# Patient Record
Sex: Female | Born: 1950
Health system: Southern US, Community
[De-identification: ages and names within clinical notes are randomized; demographics above are authoritative.]

## PROBLEM LIST (undated history)

## (undated) DIAGNOSIS — M1990 Unspecified osteoarthritis, unspecified site: Secondary | ICD-10-CM

## (undated) DIAGNOSIS — I10 Essential (primary) hypertension: Secondary | ICD-10-CM

## (undated) DIAGNOSIS — D329 Benign neoplasm of meninges, unspecified: Secondary | ICD-10-CM

## (undated) DIAGNOSIS — F419 Anxiety disorder, unspecified: Secondary | ICD-10-CM

## (undated) DIAGNOSIS — K219 Gastro-esophageal reflux disease without esophagitis: Secondary | ICD-10-CM

## (undated) DIAGNOSIS — Z9109 Other allergy status, other than to drugs and biological substances: Secondary | ICD-10-CM

## (undated) DIAGNOSIS — E785 Hyperlipidemia, unspecified: Secondary | ICD-10-CM

## (undated) DIAGNOSIS — Z9889 Other specified postprocedural states: Secondary | ICD-10-CM

## (undated) DIAGNOSIS — R112 Nausea with vomiting, unspecified: Secondary | ICD-10-CM

## (undated) DIAGNOSIS — G709 Myoneural disorder, unspecified: Secondary | ICD-10-CM

## (undated) DIAGNOSIS — G500 Trigeminal neuralgia: Secondary | ICD-10-CM

## (undated) DIAGNOSIS — C801 Malignant (primary) neoplasm, unspecified: Secondary | ICD-10-CM

## (undated) HISTORY — DX: Malignant (primary) neoplasm, unspecified: C80.1

## (undated) HISTORY — PX: WISDOM TOOTH EXTRACTION: SHX21

## (undated) HISTORY — PX: SHOULDER SURGERY: SHX246

## (undated) HISTORY — PX: NASAL SINUS SURGERY: SHX719

## (undated) HISTORY — DX: Anxiety disorder, unspecified: F41.9

## (undated) HISTORY — DX: Benign neoplasm of meninges, unspecified: D32.9

## (undated) HISTORY — DX: Myoneural disorder, unspecified: G70.9

## (undated) HISTORY — PX: KNEE SURGERY: SHX244

---

## 1998-11-30 ENCOUNTER — Ambulatory Visit (HOSPITAL_COMMUNITY): Admission: RE | Admit: 1998-11-30 | Discharge: 1998-11-30 | Payer: Self-pay

## 1999-01-28 ENCOUNTER — Other Ambulatory Visit: Admission: RE | Admit: 1999-01-28 | Discharge: 1999-01-28 | Payer: Self-pay | Admitting: *Deleted

## 1999-01-31 ENCOUNTER — Encounter: Payer: Self-pay | Admitting: Family Medicine

## 1999-01-31 ENCOUNTER — Encounter: Admission: RE | Admit: 1999-01-31 | Discharge: 1999-01-31 | Payer: Self-pay | Admitting: Family Medicine

## 2002-12-02 ENCOUNTER — Encounter: Payer: Self-pay | Admitting: Family Medicine

## 2002-12-02 ENCOUNTER — Encounter: Admission: RE | Admit: 2002-12-02 | Discharge: 2002-12-02 | Payer: Self-pay | Admitting: Family Medicine

## 2002-12-04 ENCOUNTER — Encounter: Admission: RE | Admit: 2002-12-04 | Discharge: 2002-12-04 | Payer: Self-pay | Admitting: Family Medicine

## 2002-12-04 ENCOUNTER — Encounter: Payer: Self-pay | Admitting: Family Medicine

## 2003-02-23 ENCOUNTER — Other Ambulatory Visit: Admission: RE | Admit: 2003-02-23 | Discharge: 2003-02-23 | Payer: Self-pay | Admitting: Family Medicine

## 2004-04-20 ENCOUNTER — Encounter: Admission: RE | Admit: 2004-04-20 | Discharge: 2004-04-20 | Payer: Self-pay | Admitting: Family Medicine

## 2004-07-04 ENCOUNTER — Other Ambulatory Visit: Admission: RE | Admit: 2004-07-04 | Discharge: 2004-07-04 | Payer: Self-pay | Admitting: Family Medicine

## 2006-01-26 ENCOUNTER — Encounter: Admission: RE | Admit: 2006-01-26 | Discharge: 2006-01-26 | Payer: Self-pay | Admitting: Family Medicine

## 2006-04-27 ENCOUNTER — Encounter: Payer: Self-pay | Admitting: Pulmonary Disease

## 2006-04-27 ENCOUNTER — Encounter: Admission: RE | Admit: 2006-04-27 | Discharge: 2006-04-27 | Payer: Self-pay | Admitting: Family Medicine

## 2006-06-08 ENCOUNTER — Encounter: Payer: Self-pay | Admitting: Pulmonary Disease

## 2006-06-11 ENCOUNTER — Encounter: Payer: Self-pay | Admitting: Pulmonary Disease

## 2006-10-03 ENCOUNTER — Encounter: Admission: RE | Admit: 2006-10-03 | Discharge: 2006-10-03 | Payer: Self-pay | Admitting: Allergy

## 2006-10-03 ENCOUNTER — Encounter: Payer: Self-pay | Admitting: Pulmonary Disease

## 2007-04-01 ENCOUNTER — Encounter: Payer: Self-pay | Admitting: Pulmonary Disease

## 2008-03-23 ENCOUNTER — Encounter: Payer: Self-pay | Admitting: Pulmonary Disease

## 2009-10-08 ENCOUNTER — Encounter: Payer: Self-pay | Admitting: Pulmonary Disease

## 2009-12-09 ENCOUNTER — Encounter: Payer: Self-pay | Admitting: Pulmonary Disease

## 2009-12-09 DIAGNOSIS — N200 Calculus of kidney: Secondary | ICD-10-CM | POA: Insufficient documentation

## 2009-12-09 DIAGNOSIS — N841 Polyp of cervix uteri: Secondary | ICD-10-CM | POA: Insufficient documentation

## 2009-12-09 DIAGNOSIS — J309 Allergic rhinitis, unspecified: Secondary | ICD-10-CM | POA: Insufficient documentation

## 2009-12-09 DIAGNOSIS — J45991 Cough variant asthma: Secondary | ICD-10-CM | POA: Insufficient documentation

## 2009-12-09 DIAGNOSIS — E559 Vitamin D deficiency, unspecified: Secondary | ICD-10-CM | POA: Insufficient documentation

## 2009-12-09 DIAGNOSIS — E7800 Pure hypercholesterolemia, unspecified: Secondary | ICD-10-CM | POA: Insufficient documentation

## 2009-12-09 DIAGNOSIS — M1990 Unspecified osteoarthritis, unspecified site: Secondary | ICD-10-CM | POA: Insufficient documentation

## 2009-12-09 DIAGNOSIS — I10 Essential (primary) hypertension: Secondary | ICD-10-CM | POA: Insufficient documentation

## 2009-12-10 ENCOUNTER — Ambulatory Visit: Payer: Self-pay | Admitting: Pulmonary Disease

## 2010-04-12 NOTE — Letter (Signed)
Summary: [facility]  [facility]   Imported By: Sherian Rein 01/03/2010 09:02:55  _____________________________________________________________________  External Attachment:    Type:   Image     Comment:   External Document

## 2010-04-12 NOTE — Miscellaneous (Signed)
Summary: [facility]   Imported By: Sherian Rein 01/03/2010 09:01:33  _____________________________________________________________________  External Attachment:    Type:   Image     Comment:   External Document

## 2010-04-12 NOTE — Assessment & Plan Note (Signed)
Summary: cough/apc   Visit Type:  Initial Consult Primary Provider/Referring Provider:  Dr. Raquel James (PCP) Dr. [place] Callas (Allergy)  CC:  Pt here for pulmonary consult.  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 c/o allergies for years - dr [place] Callas - gave xopenex mDI , did not help h/o sinus surgery > 27yrs ago , no infection x 3 ys , on omnairis & astepro CXR nml No childhood h/oa sthma, wheezing or nocturnal symptoms. No environmental allergens, dog mostly outside the house   Preventive Screening-Counseling & Management  Alcohol-Tobacco     Smoking Status: never  Current Medications (verified): 1)  Xyzal 5 Mg Tabs (Levocetirizine Dihydrochloride) .... As Needed 2)  Omnaris 50 Mcg/act Susp (Ciclesonide) .Marland Kitchen.. 1 Spray Each Nostril Once Daily 3)  Astepro 0.15 % Soln (Azelastine Hcl) .Marland Kitchen.. 1 Spray Each Nostril Once Daily 4)  Icaps  Caps (Multiple Vitamins-Minerals) .... Take 2 Tablet By Mouth Two Times A Day 5)  Aspirin 81 Mg  Tabs (Aspirin) .... Take 1 Tablet By Mouth Once A Day 6)  Milk Thistle 140 Mg Caps (Milk Thistle) .... Take 2 Tablet By Mouth Once A Day 7)  Red Yeast Rice 600 Mg Caps (Red Yeast Rice Extract) .... Take 2 Tablet By Mouth Once A Day 8)  Vitamin D3 1000 Unit/spray Liqd (Cholecalciferol) .... Once Daily 9)  Losartan Potassium 25 Mg Tabs (Losartan Potassium) .... Take 1 Tablet By Mouth Once A Day 10)  Xopenex Hfa 45 Mcg/act Aero (Levalbuterol Tartrate) .... As Directed 11)  Omeprazole 40 Mg Cpdr (Omeprazole) .... Take 1 Tablet By Mouth Once A Day 12)  Sanvisc Injection .... Knee 13)  Mucinex 600 Mg Xr12h-Tab (Guaifenesin) .... As Directed As Needed 14)  Calcium-Magnesium 500-250 Mg Tabs  (Calcium-Magnesium) .... Take 1 Tablet By Mouth Once A Day  Allergies (verified): 1)  ! Pcn 2)  ! Sulfa 3)  ! Ace Inhibitors 4)  ! Darvon 5)  ! Fish Oil (Fish Oil) 6)  ! * Nickel 7)  ! * Adhesive Tape  Past History:  Past Medical History: Last updated: 12/09/2009 ALLERGIC RHINITIS (ICD-477.9) NEPHROLITHIASIS (ICD-592.0) VITAMIN D DEFICIENCY (ICD-268.9) OSTEOARTHRITIS (ICD-715.90) HYPERTENSION (ICD-401.9) CERVICAL POLYP (ICD-622.7) COUGH (ICD-786.2) HYPERCHOLESTEROLEMIA (ICD-272.0)    Past Surgical History: Last updated: 12/09/2009 Knee surgery x3 Shoulder surgery: left and right Sinus surgery  Family History: Family History Hypertension-mother, sibling Family History Lung Cancer-father Family History MI/Heart Attack-mother CAD-mother Parents smokers  Social History: Marital Status: Married, lives with husband Wende Longstreth Children: 2 daughters 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  Smoking Status:  never  Review of Systems       The patient complains of non-productive cough and acid heartburn.  The patient denies shortness of breath with activity, shortness of breath at rest, productive cough, coughing up blood, chest pain, irregular heartbeats, indigestion, loss of appetite, weight change, abdominal pain, difficulty swallowing, sore throat, tooth/dental problems, headaches, nasal congestion/difficulty breathing through nose, sneezing, itching, ear ache, anxiety, depression, hand/feet swelling, joint stiffness or pain, rash, change in color of mucus, and fever.    Vital Signs:  Patient profile:   60 year old female Height:      64.5 inches Weight:  165 pounds BMI:     27.99 O2 Sat:      97 % on Room air Temp:     98.0 degrees F oral Pulse rate:   80 / minute BP sitting:   118 / 84  (left arm) Cuff size:   regular  Vitals Entered By: Zackery Barefoot CMA (December 10, 2009 11:26 AM)  O2 Flow:  Room air CC: Pt here for pulmonary  consult Comments Medications reviewed with patient Verified contact number and pharmacy with patient Zackery Barefoot CMA  December 10, 2009 11:27 AM    Physical Exam  Additional Exam:  Gen. Pleasant, well-nourished, in no distress, normal affect ENT - no lesions, no post nasal drip Neck: No JVD, no thyromegaly, no carotid bruits Lungs: no use of accessory muscles, no dullness to percussion, clear without rales or rhonchi  Cardiovascular: Rhythm regular, heart sounds  normal, no murmurs or gallops, no peripheral edema Abdomen: soft and non-tender, no hepatosplenomegaly, BS normal. Musculoskeletal: No deformities, no cyanosis or clubbing Neuro:  alert, non focal     Impression & Recommendations:  Problem # 1:  COUGH (ICD-786.2) Off enalapril x 6 months - cough should have gone by now Given nml CXR - likley etiologies include upper airway cough vs GERD. Will pursue empiric therapy for both rather than diagnostic testing. Pseudophed x 3 weeks - discussed side fects on Bp & insomnia, ct xyzal PPI for acid suppression x 2 months  - omeprazole ok. If persistent on return visit, consider sinus imaging or non acid reflux Orders: Consultation Level III (16109) T-2 View CXR (71020TC)  Medications Added to Medication List This Visit: 1)  Sanvisc Injection  .... Knee 2)  Mucinex 600 Mg Xr12h-tab (Guaifenesin) .... As directed as needed 3)  Calcium-magnesium 500-250 Mg Tabs (Calcium-magnesium) .... Take 1 tablet by mouth once a day 4)  12 Hour Decongestant 120 Mg Xr12h-cap (Pseudoephedrine hcl) .... Start once daily x 3 days, then two times a day  Patient Instructions: 1)  Copy sent to:dr turnbull 2)  Please schedule a follow-up appointment in 6 weeks. 3)  A chest x-ray has been recommended.  Your imaging study may require preauthorization.  4)  Decongestant x 3 weeks - plese check your BP with this twice/ week 5)  Omeprazole x 2 months Prescriptions: 12 HOUR DECONGESTANT 120 MG  XR12H-CAP (PSEUDOEPHEDRINE HCL) start once daily x 3 days, then two times a day  #42 x 0   Entered and Authorized by:   Comer Locket Vassie Loll MD   Signed by:   Comer Locket Vassie Loll MD on 12/10/2009   Method used:   Electronically to        [facility] [phone]* (retail)       [address]       [place], [place]  [address]       Ph: [phone] or [phone]       Fax: [phone]   RxID:   [id number]      Appended Document: cough/apc reviewed allergy notes >> spirometry nml on numerous occasions, Rt maxillary sinus AF level in 2/08 resolved on FU CT 7/08, CXR nml

## 2012-03-05 ENCOUNTER — Other Ambulatory Visit: Payer: Self-pay | Admitting: Family Medicine

## 2012-03-05 DIAGNOSIS — M5412 Radiculopathy, cervical region: Secondary | ICD-10-CM

## 2012-03-08 NOTE — Progress Notes (Signed)
Blood drawn for labs from right [facility]. Site unremarkable and pt tolerated procedure well.

## 2012-03-10 ENCOUNTER — Ambulatory Visit
Admission: RE | Admit: 2012-03-10 | Discharge: 2012-03-10 | Disposition: A | Discharge status: Home / Self Care | Payer: BC Managed Care – PPO | Source: Ambulatory Visit | Attending: Family Medicine | Admitting: Family Medicine

## 2012-03-10 DIAGNOSIS — M5412 Radiculopathy, cervical region: Secondary | ICD-10-CM

## 2012-03-10 MED ORDER — GADOBENATE DIMEGLUMINE 529 MG/ML IV SOLN
15.0000 mL | Freq: Once | INTRAVENOUS | Status: AC | PRN
  Administered 2012-03-10: 15 mL via INTRAVENOUS

## 2012-03-13 HISTORY — PX: OTHER SURGICAL HISTORY: SHX169

## 2012-05-11 HISTORY — PX: OTHER SURGICAL HISTORY: SHX169

## 2012-05-21 ENCOUNTER — Other Ambulatory Visit: Payer: Self-pay | Admitting: Neurosurgery

## 2012-05-21 ENCOUNTER — Encounter (HOSPITAL_COMMUNITY): Payer: Self-pay

## 2012-05-27 ENCOUNTER — Encounter (HOSPITAL_COMMUNITY)
Admission: RE | Admit: 2012-05-27 | Discharge: 2012-05-27 | Disposition: A | Discharge status: Home / Self Care | Payer: BC Managed Care – PPO | Source: Ambulatory Visit | Attending: Neurosurgery | Admitting: Neurosurgery

## 2012-06-04 ENCOUNTER — Inpatient Hospital Stay (HOSPITAL_COMMUNITY): Admission: RE | Admit: 2012-06-04 | Payer: BC Managed Care – PPO | Source: Ambulatory Visit | Admitting: Neurosurgery

## 2012-06-04 ENCOUNTER — Encounter (HOSPITAL_COMMUNITY): Admission: RE | Payer: Self-pay | Source: Ambulatory Visit

## 2012-06-04 SURGERY — ANTERIOR CERVICAL DECOMPRESSION/DISCECTOMY FUSION 3 LEVELS
Anesthesia: General

## 2012-06-28 ENCOUNTER — Ambulatory Visit: Payer: Self-pay | Admitting: Nurse Practitioner

## 2012-12-20 ENCOUNTER — Encounter: Payer: Self-pay | Admitting: Internal Medicine

## 2013-01-02 ENCOUNTER — Other Ambulatory Visit: Payer: Self-pay | Admitting: Orthopedic Surgery

## 2013-01-02 DIAGNOSIS — S7290XA Unspecified fracture of unspecified femur, initial encounter for closed fracture: Secondary | ICD-10-CM

## 2013-01-07 ENCOUNTER — Ambulatory Visit
Admission: RE | Admit: 2013-01-07 | Discharge: 2013-01-07 | Disposition: A | Discharge status: Home / Self Care | Payer: BC Managed Care – PPO | Source: Ambulatory Visit | Attending: Orthopedic Surgery | Admitting: Orthopedic Surgery

## 2013-01-07 DIAGNOSIS — S7290XA Unspecified fracture of unspecified femur, initial encounter for closed fracture: Secondary | ICD-10-CM

## 2013-04-07 NOTE — Progress Notes (Signed)
Dr. Wynelle Link could  you please put orders in [facility] as patient has pre-op appointment 04/09/2013 at 1 pm! Thank you!

## 2013-04-08 ENCOUNTER — Encounter (HOSPITAL_COMMUNITY): Payer: Self-pay | Admitting: Pharmacy Technician

## 2013-04-08 ENCOUNTER — Other Ambulatory Visit: Payer: Self-pay | Admitting: Orthopedic Surgery

## 2013-04-08 NOTE — Patient Instructions (Addendum)
Barbara [name]  04/08/2013                           YOUR PROCEDURE IS SCHEDULED ON:  04/16/13               PLEASE REPORT TO SHORT STAY CENTER AT :  2:30 PM               CALL THIS NUMBER IF ANY PROBLEMS THE DAY OF SURGERY :               [phone]                      REMEMBER:   Do not eat food or drink liquids AFTER MIDNIGHT  May have clear liquids UNTIL 6 HOURS BEFORE SURGERY (11:30 AM)  Clear liquids include soda, tea, black coffee, apple or grape juice, broth.  Take these medicines the morning of surgery with A SIP OF WATER: PROPANOLOL   Do not wear jewelry, make-up   Do not wear lotions, powders, or perfumes.   Do not shave legs or underarms 12 hrs. before surgery (men may shave face)  Do not bring valuables to the hospital.  Contacts, dentures or bridgework may not be worn into surgery.  Leave suitcase in the car. After surgery it may be brought to your room.  For patients admitted to the hospital more than one night, checkout time is 11:00                          The day of discharge.   Patients discharged the day of surgery will not be allowed to drive home                             If going home same day of surgery, must have someone stay with you first                           24 hrs at home and arrange for some one to drive you home from hospital.    Special Instructions:   Please read over the following fact sheets that you were given:                                 1. [place]                                                X_____________________________________________________________________        Failure to follow these instructions may result in cancellation of your surgery                                             [place] - Preparing for Surgery  Before surgery, you can play an important role.  Because skin is not sterile, your skin needs to be as free of germs as possible.  You can reduce  the number of germs on you skin by washing with CHG (chlorahexidine  gluconate) soap before surgery.  CHG is an antiseptic cleaner which kills germs and bonds with the skin to continue killing germs even after washing.  Please DO NOT use if you have an allergy to CHG or antibacterial soaps.  If your skin becomes reddened/irritated stop using the CHG and inform your nurse when you arrive at Short Stay.  Do not shave (including legs and underarms) for at least 48 hours prior to the first CHG shower.  You may shave your face.  Please follow these instructions carefully:   1.  Shower with CHG Soap the night before surgery and the morning of Surgery.  2.  If you choose to wash your hair, wash your hair first as usual with your       normal shampoo.  3.  After you shampoo, rinse your hair and body thoroughly to remove the                      Shampoo.  4.  Use CHG as you would any other liquid soap.  You can apply chg directly       to the skin and wash gently with scrungie or a clean washcloth.  5.  Apply the CHG Soap to your body ONLY FROM THE NECK DOWN.        Do not use on open wounds or open sores.  Avoid contact with your eyes,       ears, mouth and genitals (private parts).  Wash genitals (private parts)       with your normal soap.  6.  Wash thoroughly, paying special attention to the area where your surgery        will be performed.  7.  Thoroughly rinse your body with warm water from the neck down.  8.  DO NOT shower/wash with your normal soap after using and rinsing off       the CHG Soap.  9.  Pat yourself dry with a clean towel.            10.  Wear clean pajamas.            11.  Place clean sheets on your bed the night of your first shower and do not        sleep with pets.  Day of Surgery  Do not apply any lotions/deoderants the morning of surgery.  Please wear clean clothes to the hospital/surgery center.              Barbara [name]  04/09/2013                           YOUR  PROCEDURE IS SCHEDULED ON:               PLEASE REPORT TO SHORT STAY CENTER AT :               CALL THIS NUMBER IF ANY PROBLEMS THE DAY OF SURGERY :               [phone]                      REMEMBER:   Do not eat food or drink liquids AFTER MIDNIGHT  May have clear liquids UNTIL 6 HOURS BEFORE SURGERY  Clear liquids include soda, tea, black coffee, apple or grape juice, broth.  Take these medicines the morning of surgery with A SIP OF  WATER:   Do not wear jewelry, make-up   Do not wear lotions, powders, or perfumes.   Do not shave legs or underarms 12 hrs. before surgery (men may shave face)  Do not bring valuables to the hospital.  Contacts, dentures or bridgework may not be worn into surgery.  Leave suitcase in the car. After surgery it may be brought to your room.  For patients admitted to the hospital more than one night, checkout time is 11:00                          The day of discharge.   Patients discharged the day of surgery will not be allowed to drive home                             If going home same day of surgery, must have someone stay with you first                           24 hrs at home and arrange for some one to drive you home from hospital.    Special Instructions:   Please read over the following fact sheets that you were given:               1. MRSA  INFORMATION                      2. [place]                                                X_____________________________________________________________________        Failure to follow these instructions may result in cancellation of your surgery                                                         Barbara [name]  04/09/2013                           YOUR PROCEDURE IS SCHEDULED ON:               PLEASE REPORT TO SHORT STAY CENTER AT :               CALL THIS NUMBER IF ANY PROBLEMS THE DAY OF SURGERY :               [phone]                       REMEMBER:   Do not eat food or drink liquids AFTER MIDNIGHT  May have clear liquids UNTIL 6 HOURS BEFORE SURGERY  Clear liquids include soda, tea, black coffee, apple or grape juice, broth.  Take these medicines the morning of surgery with A SIP OF WATER:   Do not wear jewelry, make-up   Do not wear lotions, powders, or perfumes.   Do not shave legs or underarms 12 hrs. before surgery (men may shave face)  Do not bring  valuables to the hospital.  Contacts, dentures or bridgework may not be worn into surgery.  Leave suitcase in the car. After surgery it may be brought to your room.  For patients admitted to the hospital more than one night, checkout time is 11:00                          The day of discharge.   Patients discharged the day of surgery will not be allowed to drive home                             If going home same day of surgery, must have someone stay with you first                           24 hrs at home and arrange for some one to drive you home from hospital.    Special Instructions:   Please read over the following fact sheets that you were given:               1. MRSA  INFORMATION                      2. [place] PREPARING FOR SURGERY SHEET                                                X_____________________________________________________________________        Failure to follow these instructions may result in cancellation of your surgery

## 2013-04-09 ENCOUNTER — Encounter (HOSPITAL_COMMUNITY): Payer: Self-pay

## 2013-04-09 ENCOUNTER — Ambulatory Visit (HOSPITAL_COMMUNITY)
Admission: RE | Admit: 2013-04-09 | Discharge: 2013-04-09 | Disposition: A | Discharge status: Home / Self Care | Payer: BC Managed Care – PPO | Source: Ambulatory Visit | Attending: Orthopedic Surgery | Admitting: Orthopedic Surgery

## 2013-04-09 ENCOUNTER — Encounter (HOSPITAL_COMMUNITY)
Admission: RE | Admit: 2013-04-09 | Discharge: 2013-04-09 | Disposition: A | Discharge status: Home / Self Care | Payer: BC Managed Care – PPO | Source: Ambulatory Visit | Attending: Orthopedic Surgery | Admitting: Orthopedic Surgery

## 2013-04-09 DIAGNOSIS — I10 Essential (primary) hypertension: Secondary | ICD-10-CM | POA: Insufficient documentation

## 2013-04-09 DIAGNOSIS — Z01812 Encounter for preprocedural laboratory examination: Secondary | ICD-10-CM | POA: Insufficient documentation

## 2013-04-09 DIAGNOSIS — Z01810 Encounter for preprocedural cardiovascular examination: Secondary | ICD-10-CM | POA: Insufficient documentation

## 2013-04-09 DIAGNOSIS — Z01818 Encounter for other preprocedural examination: Secondary | ICD-10-CM | POA: Insufficient documentation

## 2013-04-09 HISTORY — DX: Unspecified osteoarthritis, unspecified site: M19.90

## 2013-04-09 HISTORY — DX: Essential (primary) hypertension: I10

## 2013-04-09 HISTORY — DX: Gastro-esophageal reflux disease without esophagitis: K21.9

## 2013-04-09 HISTORY — DX: Hyperlipidemia, unspecified: E78.5

## 2013-04-09 HISTORY — DX: Other specified postprocedural states: R11.2

## 2013-04-09 HISTORY — DX: Other specified postprocedural states: Z98.890

## 2013-04-09 LAB — BASIC METABOLIC PANEL
BUN: 17 mg/dL (ref 6–23)
CO2: 24 mEq/L (ref 19–32)
Calcium: 9.6 mg/dL (ref 8.4–10.5)
Chloride: 99 mEq/L (ref 96–112)
Creatinine, Ser: 0.73 mg/dL (ref 0.50–1.10)
GFR calc Af Amer: >90 mL/min (ref >90)
GFR calc non Af Amer: 89 mL/min — ABNORMAL LOW (ref >90)
Glucose, Bld: 96 mg/dL (ref 70–99)
Potassium: 4.7 mEq/L (ref 3.7–5.3)
Sodium: 138 mEq/L (ref 137–147)

## 2013-04-09 LAB — CBC
HCT: 42.1 % (ref 36.0–46.0)
Hemoglobin: 13.9 g/dL (ref 12.0–15.0)
MCH: 29.5 pg (ref 26.0–34.0)
MCHC: 33 g/dL (ref 30.0–36.0)
MCV: 89.4 fL (ref 78.0–100.0)
Platelets: 278 10*3/uL (ref 150–400)
RBC: 4.71 MIL/uL (ref 3.87–5.11)
RDW: 12.9 % (ref 11.5–15.5)
WBC: 5.8 10*3/uL (ref 4.0–10.5)

## 2013-04-15 DIAGNOSIS — T8484XA Pain due to internal orthopedic prosthetic devices, implants and grafts, initial encounter: Secondary | ICD-10-CM | POA: Diagnosis present

## 2013-04-15 NOTE — H&P (Signed)
CC- Barbara [name] is a 63 y.o. female who presents with left knee pain.  HPI- . Knee Pain: Patient presents with knee pain involving the  left knee. Onset of the symptoms was several months ago. Inciting event: She had a femur fracture approximately a year ago treated with a retrograde IM nail. She has healed this uneventfully but has significant lateral knee pain adjacent to the interlock screws present in the femoral nail. Current symptoms include pain located lateral knee over the screw heads. Pain is aggravated by any weight bearing, going up and down stairs and rising after sitting.  Patient has had prior knee problems. Evaluation to date: plain films: abnormal retrograde IM nail left femur with healed distal femur fracture. Treatment to date: none.  Past Medical History  Diagnosis Date  . PONV (postoperative nausea and vomiting)     "BP DROPS ALSO"  . Hypertension   . Hyperlipidemia   . Occasional tremors   . Asthma   . Knee pain, left   . Arthritis   . History of skin cancer   . GERD (gastroesophageal reflux disease)     Past Surgical History  Procedure Laterality Date  . Fractured femur  05/2012    left - rod -screws placed  . Knee surgery      x 3 left / x1 rt knee  . Shoulder surgery      bilateral shoulders   . Wisdom tooth extraction      Prior to Admission medications   Medication Sig Start Date End Date Taking? Authorizing Provider  acetaminophen (TYLENOL) 500 MG tablet Take 1,000 mg by mouth every 6 (six) hours as needed for mild pain or moderate pain.     Historical Provider, MD  aspirin EC 81 MG tablet Take 81 mg by mouth daily.    Historical Provider, MD  beta carotene w/minerals (OCUVITE) tablet Take 1 tablet by mouth daily.    Historical Provider, MD  Cholecalciferol (VITAMIN D-3) 1000 UNITS CAPS Take 1 capsule by mouth daily.    Historical Provider, MD  Ferrous Sulfate (IRON) 325 (65 FE) MG TABS Take 1 tablet by mouth 2 (two) times a week.     Historical  Provider, MD  guaiFENesin (MUCINEX) 600 MG 12 hr tablet Take 600 mg by mouth daily as needed for congestion.    Historical Provider, MD  Homeopathic Products (ZINC COLD THERAPY PO) Take 1 tablet by mouth every 6 (six) hours as needed (TO PREVENT COLDS).    Historical Provider, MD  levocetirizine (XYZAL) 5 MG tablet Take 5 mg by mouth every evening.    Historical Provider, MD  loratadine (CLARITIN) 10 MG tablet Take 10 mg by mouth daily as needed for allergies.    Historical Provider, MD  milk thistle 175 MG tablet Take 175 mg by mouth 2 (two) times daily.    Historical Provider, MD  Polyvinyl Alcohol-Povidone (REFRESH OP) Place 1 drop into both eyes 2 (two) times daily as needed (dry eyes).    Historical Provider, MD  propranolol (INDERAL) 10 MG tablet Take 10 mg by mouth 2 (two) times daily as needed (tremors).    Historical Provider, MD  psyllium (METAMUCIL SMOOTH TEXTURE) 28 % packet Take 1 packet by mouth daily as needed.    Historical Provider, MD  Red Yeast Rice 600 MG TABS Take 1 tablet by mouth 2 (two) times daily.    Historical Provider, MD  vitamin C (ASCORBIC ACID) 500 MG tablet Take 500 mg by mouth  daily.    Historical Provider, MD   LEFT KNEE EXAM antalgic gait, soft tissue tenderness over screw heads present lateral knee, crepitus on ROM left knee  Physical Examination: General appearance - alert, well appearing, and in no distress Mental status - alert, oriented to person, place, and time Chest - clear to auscultation, no wheezes, rales or rhonchi, symmetric air entry Heart - normal rate, regular rhythm, normal S1, S2, no murmurs, rubs, clicks or gallops Abdomen - soft, nontender, nondistended, no masses or organomegaly Neurological - alert, oriented, normal speech, no focal findings or movement disorder noted   Asessment/Plan--- Left knee painful hardware- - Plan left knee hardware removal. Procedure risks and potential comps discussed with patient who elects to proceed. Goals  are decreased pain and increased function with a high likelihood of achieving both

## 2013-04-16 ENCOUNTER — Ambulatory Visit (HOSPITAL_COMMUNITY)
Admission: RE | Admit: 2013-04-16 | Discharge: 2013-04-16 | Disposition: A | Discharge status: Home / Self Care | Payer: BC Managed Care – PPO | Source: Ambulatory Visit | Attending: Orthopedic Surgery | Admitting: Orthopedic Surgery

## 2013-04-16 ENCOUNTER — Ambulatory Visit (HOSPITAL_COMMUNITY): Payer: BC Managed Care – PPO | Admitting: Anesthesiology

## 2013-04-16 ENCOUNTER — Encounter (HOSPITAL_COMMUNITY): Payer: BC Managed Care – PPO | Admitting: Anesthesiology

## 2013-04-16 ENCOUNTER — Encounter (HOSPITAL_COMMUNITY)
Admission: RE | Disposition: A | Discharge status: Home / Self Care | Payer: Self-pay | Source: Ambulatory Visit | Attending: Orthopedic Surgery

## 2013-04-16 ENCOUNTER — Encounter (HOSPITAL_COMMUNITY): Payer: Self-pay | Admitting: *Deleted

## 2013-04-16 DIAGNOSIS — Z85828 Personal history of other malignant neoplasm of skin: Secondary | ICD-10-CM | POA: Insufficient documentation

## 2013-04-16 DIAGNOSIS — T8489XA Other specified complication of internal orthopedic prosthetic devices, implants and grafts, initial encounter: Secondary | ICD-10-CM | POA: Insufficient documentation

## 2013-04-16 DIAGNOSIS — Y831 Surgical operation with implant of artificial internal device as the cause of abnormal reaction of the patient, or of later complication, without mention of misadventure at the time of the procedure: Secondary | ICD-10-CM | POA: Insufficient documentation

## 2013-04-16 DIAGNOSIS — T8484XA Pain due to internal orthopedic prosthetic devices, implants and grafts, initial encounter: Secondary | ICD-10-CM | POA: Diagnosis present

## 2013-04-16 DIAGNOSIS — Z7982 Long term (current) use of aspirin: Secondary | ICD-10-CM | POA: Insufficient documentation

## 2013-04-16 DIAGNOSIS — K219 Gastro-esophageal reflux disease without esophagitis: Secondary | ICD-10-CM | POA: Insufficient documentation

## 2013-04-16 DIAGNOSIS — I10 Essential (primary) hypertension: Secondary | ICD-10-CM | POA: Insufficient documentation

## 2013-04-16 DIAGNOSIS — M25569 Pain in unspecified knee: Secondary | ICD-10-CM | POA: Insufficient documentation

## 2013-04-16 DIAGNOSIS — Z79899 Other long term (current) drug therapy: Secondary | ICD-10-CM | POA: Insufficient documentation

## 2013-04-16 DIAGNOSIS — E785 Hyperlipidemia, unspecified: Secondary | ICD-10-CM | POA: Insufficient documentation

## 2013-04-16 HISTORY — PX: HARDWARE REMOVAL: SHX979

## 2013-04-16 SURGERY — REMOVAL, HARDWARE
Anesthesia: General | Site: Knee | Laterality: Left

## 2013-04-16 MED ORDER — OXYCODONE HCL 5 MG PO TABS: 5.0000 mg | ORAL_TABLET | Freq: Once | ORAL | Status: DC | PRN

## 2013-04-16 MED ORDER — DEXAMETHASONE SODIUM PHOSPHATE 10 MG/ML IJ SOLN: 10.0000 mg | Freq: Once | INTRAMUSCULAR | Status: DC

## 2013-04-16 MED ORDER — METOCLOPRAMIDE HCL 5 MG/ML IJ SOLN
5.0000 mg | Freq: Once | INTRAMUSCULAR | Status: AC
  Administered 2013-04-16: 5 mg via INTRAVENOUS

## 2013-04-16 MED ORDER — SCOPOLAMINE 1 MG/3DAYS TD PT72
MEDICATED_PATCH | TRANSDERMAL | Status: AC
  Filled 2013-04-16: qty 1

## 2013-04-16 MED ORDER — DEXAMETHASONE SODIUM PHOSPHATE 10 MG/ML IJ SOLN
INTRAMUSCULAR | Status: DC | PRN
  Administered 2013-04-16: 10 mg via INTRAVENOUS

## 2013-04-16 MED ORDER — 0.9 % SODIUM CHLORIDE (POUR BTL) OPTIME
TOPICAL | Status: DC | PRN
  Administered 2013-04-16: 1000 mL

## 2013-04-16 MED ORDER — BUPIVACAINE-EPINEPHRINE 0.25% -1:200000 IJ SOLN
INTRAMUSCULAR | Status: DC | PRN
  Administered 2013-04-16: 30 mL

## 2013-04-16 MED ORDER — ONDANSETRON HCL 4 MG/2ML IJ SOLN
INTRAMUSCULAR | Status: AC
  Filled 2013-04-16: qty 2

## 2013-04-16 MED ORDER — METOCLOPRAMIDE HCL 5 MG/ML IJ SOLN
INTRAMUSCULAR | Status: AC
  Filled 2013-04-16: qty 2

## 2013-04-16 MED ORDER — DEXAMETHASONE SODIUM PHOSPHATE 10 MG/ML IJ SOLN
INTRAMUSCULAR | Status: AC
  Filled 2013-04-16: qty 1

## 2013-04-16 MED ORDER — PROMETHAZINE HCL 25 MG/ML IJ SOLN
INTRAMUSCULAR | Status: AC
  Filled 2013-04-16: qty 1

## 2013-04-16 MED ORDER — LIDOCAINE HCL (CARDIAC) 20 MG/ML IV SOLN
INTRAVENOUS | Status: AC
  Filled 2013-04-16: qty 5

## 2013-04-16 MED ORDER — PROMETHAZINE HCL 25 MG/ML IJ SOLN
6.2500 mg | INTRAMUSCULAR | Status: AC | PRN
  Administered 2013-04-16 (×2): 6.25 mg via INTRAVENOUS

## 2013-04-16 MED ORDER — FENTANYL CITRATE 0.05 MG/ML IJ SOLN
INTRAMUSCULAR | Status: AC
  Filled 2013-04-16: qty 5

## 2013-04-16 MED ORDER — BUPIVACAINE-EPINEPHRINE PF 0.25-1:200000 % IJ SOLN
INTRAMUSCULAR | Status: AC
  Filled 2013-04-16: qty 30

## 2013-04-16 MED ORDER — MEPERIDINE HCL 50 MG/ML IJ SOLN: 6.2500 mg | INTRAMUSCULAR | Status: DC | PRN

## 2013-04-16 MED ORDER — VANCOMYCIN HCL IN DEXTROSE 1-5 GM/200ML-% IV SOLN
1000.0000 mg | INTRAVENOUS | Status: AC
Start: 2013-04-16 — End: 2013-04-16
  Administered 2013-04-16: 1000 mg via INTRAVENOUS

## 2013-04-16 MED ORDER — TRAMADOL HCL 50 MG PO TABS: 50.0000 mg | ORAL_TABLET | Freq: Four times a day (QID) | ORAL | Status: DC | PRN

## 2013-04-16 MED ORDER — PROPOFOL 10 MG/ML IV BOLUS
INTRAVENOUS | Status: DC | PRN
  Administered 2013-04-16: 200 mg via INTRAVENOUS

## 2013-04-16 MED ORDER — PROPOFOL INFUSION 10 MG/ML OPTIME
INTRAVENOUS | Status: DC | PRN
  Administered 2013-04-16: 25 ug/kg/min via INTRAVENOUS

## 2013-04-16 MED ORDER — LACTATED RINGERS IV SOLN
INTRAVENOUS | Status: DC | PRN
  Administered 2013-04-16: 15:00:00 via INTRAVENOUS

## 2013-04-16 MED ORDER — MIDAZOLAM HCL 2 MG/2ML IJ SOLN
INTRAMUSCULAR | Status: AC
  Filled 2013-04-16: qty 2

## 2013-04-16 MED ORDER — EPHEDRINE SULFATE 50 MG/ML IJ SOLN
INTRAMUSCULAR | Status: DC | PRN
  Administered 2013-04-16: 5 mg via INTRAVENOUS
  Administered 2013-04-16: 10 mg via INTRAVENOUS

## 2013-04-16 MED ORDER — VANCOMYCIN HCL IN DEXTROSE 1-5 GM/200ML-% IV SOLN
INTRAVENOUS | Status: AC
  Filled 2013-04-16: qty 200

## 2013-04-16 MED ORDER — LIDOCAINE HCL 1 % IJ SOLN
INTRAMUSCULAR | Status: DC | PRN
  Administered 2013-04-16: 100 mg via INTRADERMAL

## 2013-04-16 MED ORDER — PROPOFOL 10 MG/ML IV BOLUS
INTRAVENOUS | Status: AC
  Filled 2013-04-16: qty 20

## 2013-04-16 MED ORDER — EPHEDRINE SULFATE 50 MG/ML IJ SOLN
INTRAMUSCULAR | Status: AC
  Filled 2013-04-16: qty 1

## 2013-04-16 MED ORDER — SODIUM CHLORIDE 0.9 % IV SOLN: INTRAVENOUS | Status: DC

## 2013-04-16 MED ORDER — SODIUM CHLORIDE 0.9 % IJ SOLN
INTRAMUSCULAR | Status: AC
  Filled 2013-04-16: qty 10

## 2013-04-16 MED ORDER — OXYCODONE HCL 5 MG/5ML PO SOLN
5.0000 mg | Freq: Once | ORAL | Status: DC | PRN
  Filled 2013-04-16: qty 5

## 2013-04-16 MED ORDER — ACETAMINOPHEN 10 MG/ML IV SOLN
1000.0000 mg | Freq: Once | INTRAVENOUS | Status: AC
  Administered 2013-04-16: 1000 mg via INTRAVENOUS
  Filled 2013-04-16: qty 100

## 2013-04-16 MED ORDER — FENTANYL CITRATE 0.05 MG/ML IJ SOLN
INTRAMUSCULAR | Status: DC | PRN
  Administered 2013-04-16 (×3): 50 ug via INTRAVENOUS

## 2013-04-16 MED ORDER — HYDROMORPHONE HCL PF 1 MG/ML IJ SOLN: 0.2500 mg | INTRAMUSCULAR | Status: DC | PRN

## 2013-04-16 MED ORDER — ONDANSETRON HCL 4 MG PO TABS: 4.0000 mg | ORAL_TABLET | Freq: Three times a day (TID) | ORAL | Status: DC | PRN

## 2013-04-16 MED ORDER — ONDANSETRON HCL 4 MG/2ML IJ SOLN
INTRAMUSCULAR | Status: DC | PRN
  Administered 2013-04-16: 4 mg via INTRAVENOUS

## 2013-04-16 MED ORDER — MIDAZOLAM HCL 5 MG/5ML IJ SOLN
INTRAMUSCULAR | Status: DC | PRN
  Administered 2013-04-16: 2 mg via INTRAVENOUS

## 2013-04-16 MED ORDER — CHLORHEXIDINE GLUCONATE 4 % EX LIQD: 60.0000 mL | Freq: Once | CUTANEOUS | Status: DC

## 2013-04-16 SURGICAL SUPPLY — 42 items
BANDAGE ELASTIC 6 VELCRO ST LF (GAUZE/BANDAGES/DRESSINGS) ×3 IMPLANT
BANDAGE ESMARK 6X9 LF (GAUZE/BANDAGES/DRESSINGS) ×1 IMPLANT
BNDG CMPR 9X6 STRL LF SNTH (GAUZE/BANDAGES/DRESSINGS) ×1
BNDG ESMARK 6X9 LF (GAUZE/BANDAGES/DRESSINGS) ×3
CLOSURE WOUND 1/2 X4 (GAUZE/BANDAGES/DRESSINGS) ×1
CUFF TOURN SGL QUICK 18 (TOURNIQUET CUFF) IMPLANT
CUFF TOURN SGL QUICK 34 (TOURNIQUET CUFF) ×3
CUFF TRNQT CYL 34X4X40X1 (TOURNIQUET CUFF) IMPLANT
DRAPE C-ARM 42X120 X-RAY (DRAPES) ×1 IMPLANT
DRAPE C-ARMOR (DRAPES) ×1 IMPLANT
DRAPE EXTREMITY T 121X128X90 (DRAPE) ×3 IMPLANT
DRAPE INCISE IOBAN 66X45 STRL (DRAPES) ×1 IMPLANT
DRAPE ORTHO SPLIT 77X108 STRL (DRAPES)
DRAPE SURG ORHT 6 SPLT 77X108 (DRAPES) IMPLANT
DRSG ADAPTIC 3X8 NADH LF (GAUZE/BANDAGES/DRESSINGS) ×1 IMPLANT
DRSG MEPILEX BORDER 4X4 (GAUZE/BANDAGES/DRESSINGS) ×2 IMPLANT
DURAPREP 26ML APPLICATOR (WOUND CARE) ×3 IMPLANT
ELECT REM PT RETURN 9FT ADLT (ELECTROSURGICAL) ×3
ELECTRODE REM PT RTRN 9FT ADLT (ELECTROSURGICAL) ×1 IMPLANT
GLOVE BIO SURGEON STRL SZ7.5 (GLOVE) ×3 IMPLANT
GLOVE BIO SURGEON STRL SZ8 (GLOVE) ×6 IMPLANT
GLOVE BIOGEL PI IND STRL 8 (GLOVE) ×3 IMPLANT
GLOVE BIOGEL PI INDICATOR 8 (GLOVE) ×6
GOWN STRL REUS W/TWL LRG LVL3 (GOWN DISPOSABLE) ×3 IMPLANT
GOWN STRL REUS W/TWL XL LVL3 (GOWN DISPOSABLE) ×3 IMPLANT
KIT BASIN OR (CUSTOM PROCEDURE TRAY) ×3 IMPLANT
MANIFOLD NEPTUNE II (INSTRUMENTS) ×3 IMPLANT
NS IRRIG 1000ML POUR BTL (IV SOLUTION) ×3 IMPLANT
PACK TOTAL JOINT (CUSTOM PROCEDURE TRAY) ×3 IMPLANT
PAD ABD 8X10 STRL (GAUZE/BANDAGES/DRESSINGS) ×3 IMPLANT
PADDING CAST COTTON 6X4 STRL (CAST SUPPLIES) ×3 IMPLANT
POSITIONER SURGICAL ARM (MISCELLANEOUS) ×3 IMPLANT
SPONGE GAUZE 4X4 12PLY (GAUZE/BANDAGES/DRESSINGS) ×3 IMPLANT
STAPLER VISISTAT 35W (STAPLE) IMPLANT
STRIP CLOSURE SKIN 1/2X4 (GAUZE/BANDAGES/DRESSINGS) ×2 IMPLANT
SUT MNCRL AB 4-0 PS2 18 (SUTURE) ×3 IMPLANT
SUT VIC AB 0 CT1 36 (SUTURE) ×4 IMPLANT
SUT VIC AB 2-0 CT1 27 (SUTURE) ×3
SUT VIC AB 2-0 CT1 TAPERPNT 27 (SUTURE) ×2 IMPLANT
TOWEL OR 17X26 10 PK STRL BLUE (TOWEL DISPOSABLE) ×4 IMPLANT
UNDERPAD 30X30 INCONTINENT (UNDERPADS AND DIAPERS) ×3 IMPLANT
WATER STERILE IRR 1500ML POUR (IV SOLUTION) ×1 IMPLANT

## 2013-04-16 NOTE — Progress Notes (Signed)
Dr Lissa Hoard aware pt received 12.5 phenergan but nausea persists. Order received

## 2013-04-16 NOTE — Preoperative (Signed)
Beta Blockers   Reason not to administer Beta Blockers:Propanolol taken at 0930 04-16-13

## 2013-04-16 NOTE — Progress Notes (Signed)
Pt reports nausea persistes but she feels she can go home and sleep.  Reports ponv has been much worse than this.

## 2013-04-16 NOTE — Transfer of Care (Signed)
Immediate Anesthesia Transfer of Care Note  Patient: Barbara [name]  Procedure(s) Performed: Procedure(s): REMOVAL OF HARDWARE OF LEFT KNEE (DISTAL INTERLOC SCREW) (Left)  Patient Location: PACU  Anesthesia Type:General  Level of Consciousness: awake, alert , oriented and patient cooperative  Airway & Oxygen Therapy: Patient Spontanous Breathing and Patient connected to face mask oxygen  Post-op Assessment: Report given to PACU RN, Post -op Vital signs reviewed and stable and Patient moving all extremities  Post vital signs: Reviewed and stable  Complications: No apparent anesthesia complications

## 2013-04-16 NOTE — Brief Op Note (Signed)
04/16/2013  4:30 PM  PATIENT:  Barbara [name]  63 y.o. female  PRE-OPERATIVE DIAGNOSIS:  Painful hardware of the left knee  POST-OPERATIVE DIAGNOSIS: Painful hardware of the left knee   PROCEDURE:  Procedure(s): REMOVAL OF HARDWARE OF LEFT KNEE (DISTAL INTERLOC SCREW) (Left)  SURGEON:  Surgeon(s) and Role:    * Gearlean Alf, MD - Primary  PHYSICIAN ASSISTANT:   ASSISTANTS: none   ANESTHESIA:   general  EBL:  Total I/O In: 1000 [I.V.:1000] Out: -   DRAINS: none   LOCAL MEDICATIONS USED:  MARCAINE     COUNTS:  YES  TOURNIQUET:   Total Tourniquet Time Documented: Thigh (Left) - 14 minutes Total: Thigh (Left) - 14 minutes   DICTATION: .Other Dictation: Dictation Number [id number]  PLAN OF CARE: Discharge to home after PACU  PATIENT DISPOSITION:  PACU - hemodynamically stable.

## 2013-04-16 NOTE — Anesthesia Preprocedure Evaluation (Addendum)
Anesthesia Evaluation  Patient identified by MRN, date of birth, ID band Patient awake    Reviewed: Allergy & Precautions, H&P , NPO status , Patient's Chart, lab work & pertinent test results  History of Anesthesia Complications (+) PONV, MALIGNANT HYPERTHERMIA and history of anesthetic complications  Airway Mallampati: II TM Distance: >3 FB Neck ROM: Full    Dental  (+) Dental Advisory Given and Teeth Intact   Pulmonary asthma ,  breath sounds clear to auscultation        Cardiovascular hypertension, Pt. on medications Rhythm:Regular Rate:Normal     Neuro/Psych negative neurological ROS  negative psych ROS   GI/Hepatic Neg liver ROS, GERD-  Medicated,  Endo/Other  negative endocrine ROS  Renal/GU Renal disease     Musculoskeletal negative musculoskeletal ROS (+)   Abdominal   Peds  Hematology negative hematology ROS (+)   Anesthesia Other Findings   Reproductive/Obstetrics negative OB ROS                         Anesthesia Physical Anesthesia Plan  ASA: II  Anesthesia Plan: General   Post-op Pain Management:    Induction: Intravenous  Airway Management Planned: LMA  Additional Equipment:   Intra-op Plan:   Post-operative Plan: Extubation in OR  Informed Consent: I have reviewed the patients History and Physical, chart, labs and discussed the procedure including the risks, benefits and alternatives for the proposed anesthesia with the patient or authorized representative who has indicated his/her understanding and acceptance.   Dental advisory given  Plan Discussed with: CRNA  Anesthesia Plan Comments:         Anesthesia Quick Evaluation

## 2013-04-17 ENCOUNTER — Encounter (HOSPITAL_COMMUNITY): Payer: Self-pay | Admitting: Orthopedic Surgery

## 2013-04-17 NOTE — Anesthesia Postprocedure Evaluation (Signed)
Anesthesia Post Note  Patient: Barbara [name]  Procedure(s) Performed: Procedure(s) (LRB): REMOVAL OF HARDWARE OF LEFT KNEE (DISTAL INTERLOC SCREW) (Left)  Anesthesia type: General  Patient location: PACU  Post pain: Pain level controlled  Post assessment: Post-op Vital signs reviewed  Last Vitals: BP 137/78  Pulse 80  Temp(Src) 36.1 C (Oral)  Resp 16  SpO2 96%  Post vital signs: Reviewed  Level of consciousness: sedated  Complications: No apparent anesthesia complications

## 2013-04-17 NOTE — Op Note (Signed)
Barbara [name], Barbara [name] NO.:  [id number]  MEDICAL RECORD NO.:  [id number]  LOCATION:  [facility]                         FACILITY:  [facility]  PHYSICIAN:  Gaynelle Arabian, M.D.    DATE OF BIRTH:  1951/02/05  DATE OF PROCEDURE:  04/16/2013 DATE OF DISCHARGE:  04/16/2013                              OPERATIVE REPORT   PREOPERATIVE DIAGNOSIS:  Painful hardware, left knee.  POSTOPERATIVE DIAGNOSIS:  Painful hardware, left knee.  PROCEDURE:  Hardware removal, left knee.  SURGEON:  Gaynelle Arabian, MD  ASSISTANT:  No assistant.  ANESTHESIA:  General.  ESTIMATED BLOOD LOSS:  Minimal.  DRAINS:  None.  COMPLICATIONS:  None.  TOURNIQUET TIME:  14 minutes at 300 mmHg.  CONDITION:  Stable to recovery.  BRIEF CLINICAL NOTE:  [name] is a 63 year old female, who had a left distal femur fracture almost a year ago.  This was treated in [place], [place] with a retrograde intramedullary nail.  She has gone on to heal the fracture uneventfully but has significant lateral knee pain where her IT band was rubbing over her screw head from the distal interlocks in the nail.  She presents now for removal of those screws as the fracture appears to be healed.  PROCEDURE IN DETAIL:  After successful administration of general anesthetic, a tourniquet was placed high on the left thigh.  The left lower extremity, prepped and draped in the usual sterile fashion. Extremity was wrapped in Esmarch.  Tourniquet was inflated to 300 mmHg. Lateral based incision was made utilizing and incorporating the 2 incisions from her previous interlock screws.  Skin was cut with 10 blade through subcutaneous tissue to IT band which was incised in line with the skin incision.  First screw head easily identified and the screw removed.  This was the distal most of the 2 screws.  The proximal most screw identified and that was also removed.  The wound was then copiously irrigated with saline solution.   The IT band was closed with interrupted #1 Vicryl.  Tourniquet was then released total time of 14 minutes.  All bleeding was stopped with electrocautery.  Further irrigation was then performed.  A total of 30 mL of 0.25% Marcaine with epinephrine injected into the IT band and the subcu tissues, and the periosteum of the femur.  The subcu was then closed with interrupted 2-0 Vicryl and subcuticular running 4-0 Monocryl.  Incisions cleaned and dried and Steri-Strips and a bulky sterile dressing applied. She was then awakened and transported to recovery in stable condition.     Gaynelle Arabian, M.D.     FA/MEDQ  D:  04/16/2013  T:  04/17/2013  Job:  [id number]

## 2013-06-06 ENCOUNTER — Other Ambulatory Visit (HOSPITAL_COMMUNITY)
Admission: RE | Admit: 2013-06-06 | Discharge: 2013-06-06 | Disposition: A | Discharge status: Home / Self Care | Payer: BC Managed Care – PPO | Source: Ambulatory Visit | Attending: Family Medicine | Admitting: Family Medicine

## 2013-06-06 ENCOUNTER — Other Ambulatory Visit: Payer: Self-pay | Admitting: Family Medicine

## 2013-06-06 DIAGNOSIS — Z124 Encounter for screening for malignant neoplasm of cervix: Secondary | ICD-10-CM | POA: Insufficient documentation

## 2013-06-06 DIAGNOSIS — Z1151 Encounter for screening for human papillomavirus (HPV): Secondary | ICD-10-CM | POA: Insufficient documentation

## 2013-07-25 ENCOUNTER — Encounter: Payer: Self-pay | Admitting: Internal Medicine

## 2013-09-10 ENCOUNTER — Ambulatory Visit (AMBULATORY_SURGERY_CENTER): Payer: BC Managed Care – PPO | Admitting: *Deleted

## 2013-09-10 VITALS — Ht 63.0 in | Wt 167.8 lb

## 2013-09-10 DIAGNOSIS — Z1211 Encounter for screening for malignant neoplasm of colon: Secondary | ICD-10-CM

## 2013-09-10 MED ORDER — MOVIPREP 100 G PO SOLR: ORAL | Status: DC

## 2013-09-10 NOTE — Progress Notes (Signed)
No egg or soy allergy  Pt states she does have severe nausea and vomiting after general anesthesia  Denies being told she is difficult to intubate  No diet or medications taken  Registered in EMMI  She is allergic to Synvisc, which contains rooster cone (per pt).  I discussed this with J Nulty CRNA, who states ok for propofol

## 2013-09-11 ENCOUNTER — Encounter: Payer: Self-pay | Admitting: Internal Medicine

## 2013-09-17 ENCOUNTER — Encounter: Payer: Self-pay | Admitting: Internal Medicine

## 2013-09-24 ENCOUNTER — Ambulatory Visit (AMBULATORY_SURGERY_CENTER): Payer: BC Managed Care – PPO | Admitting: Internal Medicine

## 2013-09-24 ENCOUNTER — Encounter: Payer: Self-pay | Admitting: Internal Medicine

## 2013-09-24 VITALS — BP 121/84 | HR 62 | Temp 97.2°F | Resp 24 | Ht 63.0 in | Wt 167.0 lb

## 2013-09-24 DIAGNOSIS — Z1211 Encounter for screening for malignant neoplasm of colon: Secondary | ICD-10-CM

## 2013-09-24 MED ORDER — SODIUM CHLORIDE 0.9 % IV SOLN: 500.0000 mL | INTRAVENOUS | Status: DC

## 2013-09-24 NOTE — Op Note (Signed)
[place]  [address] [place], [id number]   COLONOSCOPY PROCEDURE REPORT  PATIENT: Barbara [name], Barbara [name]  MR#: [id number] BIRTHDATE: 1950-06-18 , 62  yrs. old GENDER: Female ENDOSCOPIST: Lafayette Dragon, MD REFERRED KP:[id number] Stephanie Acre, M.D. PROCEDURE DATE:  09/24/2013 PROCEDURE:   Colonoscopy, screening First Screening Colonoscopy - Avg.  risk and is 50 yrs.  old or older - No.  Prior Negative Screening - Now for repeat screening. 10 or more years since last screening  History of Adenoma - Now for follow-up colonoscopy & has been > or = to 3 yrs.  N/A  Polyps Removed Today? No.  Recommend repeat exam, <10 yrs? No. ASA CLASS:   Class II INDICATIONS:Average risk patient for colon cancer and prior colonoscopy in December 2004 was normal except for hemorrhoids. MEDICATIONS: MAC sedation, administered by CRNA and Propofol (Diprivan) 290 mg IV  DESCRIPTION OF PROCEDURE:   After the risks benefits and alternatives of the procedure were thoroughly explained, informed consent was obtained.  A digital rectal exam revealed no abnormalities of the rectum.   The LB [id number]  endoscope was introduced through the anus and advanced to the cecum, which was identified by both the appendix and ileocecal valve. No adverse events experienced.   The quality of the prep was good, using MoviPrep  The instrument was then slowly withdrawn as the colon was fully examined.      COLON FINDINGS: Small internal hemorrhoids were found.  Retroflexed views revealed no abnormalities. The time to cecum=11 minutes 12 seconds.  Withdrawal time=6 minutes 04 seconds.  The scope was withdrawn and the procedure completed. COMPLICATIONS: There were no complications.  ENDOSCOPIC IMPRESSION: Small internal hemorrhoids  RECOMMENDATIONS: high fiber diet Recall colonoscopy in 10 years   eSigned:  Lafayette Dragon, MD 09/24/2013 9:52 AM   cc:

## 2013-09-24 NOTE — Patient Instructions (Signed)
YOU HAD AN ENDOSCOPIC PROCEDURE TODAY AT THE [facility]: Refer to the procedure report that was given to you for any specific questions about what was found during the examination.  If the procedure report does not answer your questions, please call your gastroenterologist to clarify.  If you requested that your care partner not be given the details of your procedure findings, then the procedure report has been included in a sealed envelope for you to review at your convenience later.  YOU SHOULD EXPECT: Some feelings of bloating in the abdomen. Passage of more gas than usual.  Walking can help get rid of the air that was put into your GI tract during the procedure and reduce the bloating. If you had a lower endoscopy (such as a colonoscopy or flexible sigmoidoscopy) you may notice spotting of blood in your stool or on the toilet paper. If you underwent a bowel prep for your procedure, then you may not have a normal bowel movement for a few days.  DIET: Your first meal following the procedure should be a light meal and then it is ok to progress to your normal diet.  A half-sandwich or bowl of soup is an example of a good first meal.  Heavy or fried foods are harder to digest and may make you feel nauseous or bloated.  Likewise meals heavy in dairy and vegetables can cause extra gas to form and this can also increase the bloating.  Drink plenty of fluids but you should avoid alcoholic beverages for 24 hours.  ACTIVITY: Your care partner should take you home directly after the procedure.  You should plan to take it easy, moving slowly for the rest of the day.  You can resume normal activity the day after the procedure however you should NOT DRIVE or use heavy machinery for 24 hours (because of the sedation medicines used during the test).    SYMPTOMS TO REPORT IMMEDIATELY: A gastroenterologist can be reached at any hour.  During normal business hours, 8:30 AM to 5:00 PM Monday through Friday,  call [phone].  After hours and on weekends, please call the GI answering service at [phone] who will take a message and have the physician on call contact you.   Following lower endoscopy (colonoscopy or flexible sigmoidoscopy):  Excessive amounts of blood in the stool  Significant tenderness or worsening of abdominal pains  Swelling of the abdomen that is new, acute  Fever of 100F or higher  FOLLOW UP: If any biopsies were taken you will be contacted by phone or by letter within the next 1-3 weeks.  Call your gastroenterologist if you have not heard about the biopsies in 3 weeks.  Our staff will call the home number listed on your records the next business day following your procedure to check on you and address any questions or concerns that you may have at that time regarding the information given to you following your procedure. This is a courtesy call and so if there is no answer at the home number and we have not heard from you through the emergency physician on call, we will assume that you have returned to your regular daily activities without incident.  SIGNATURES/CONFIDENTIALITY: You and/or your care partner have signed paperwork which will be entered into your electronic medical record.  These signatures attest to the fact that that the information above on your After Visit Summary has been reviewed and is understood.  Full responsibility of the confidentiality of this   discharge information lies with you and/or your care-partner.  Hemorrhoids, high fiber diet-handouts given  Normal exam-repeat colonoscopy in 10 years-2025.

## 2013-09-24 NOTE — Progress Notes (Signed)
A/ox3, pleased with MAC, report to RN 

## 2013-09-25 ENCOUNTER — Telehealth: Payer: Self-pay | Admitting: *Deleted

## 2013-09-25 NOTE — Telephone Encounter (Signed)
  Follow up Call-  Call back number 09/24/2013  Post procedure Call Back phone  # [phone]  Permission to leave phone message Yes     Patient questions:  Do you have a fever, pain , or abdominal swelling? No. Pain Score  0 *  Have you tolerated food without any problems? Yes.    Have you been able to return to your normal activities? Yes.    Do you have any questions about your discharge instructions: Diet   No. Medications  No. Follow up visit  No.  Do you have questions or concerns about your Care? No.  Actions: * If pain score is 4 or above: No action needed, pain <4.

## 2013-11-05 ENCOUNTER — Encounter: Payer: Self-pay | Admitting: Internal Medicine

## 2015-02-08 IMAGING — CT CT FEMUR *L* W/O CM
3 series · 16 of 33 positions shown, 19 images · non-contrast
Comparison: None.

CLINICAL DATA: Left femur fracture. Evaluate healing. Injury in
May 2012. Persistent Sharp pain in the femur.

EXAM:
CT OF THE LEFT FEMUR WITHOUT CONTRAST
TECHNIQUE: Contiguous axial images were obtained through the left femur without
IV contrast. Coronal and sagittal reconstructed images were created.

[Series 4: lower ext soft · axial · 0.47mm/px · z∈[-558,-81]mm · 8 of 227 slices shown, 10 images]
[im 18/227  soft-tissue]
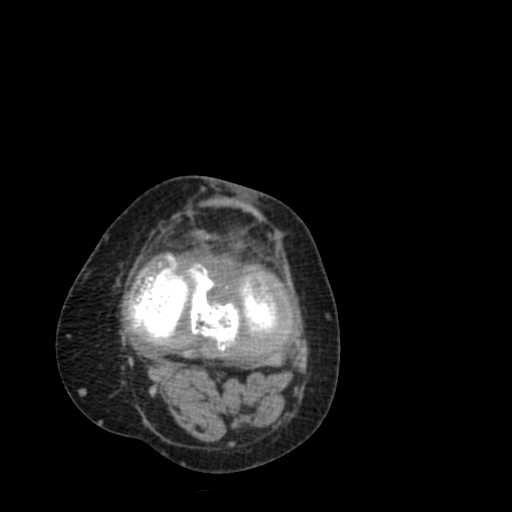
[im 18/227  bone]
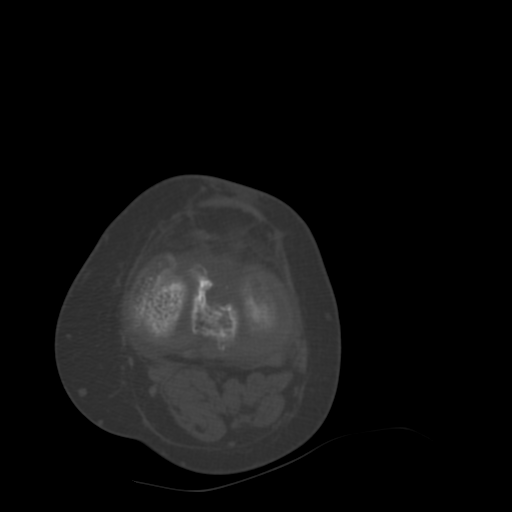
[im 53/227  bone]
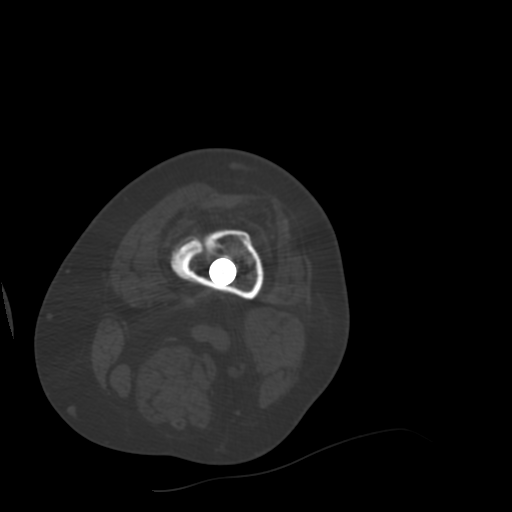
[im 70/227  bone]
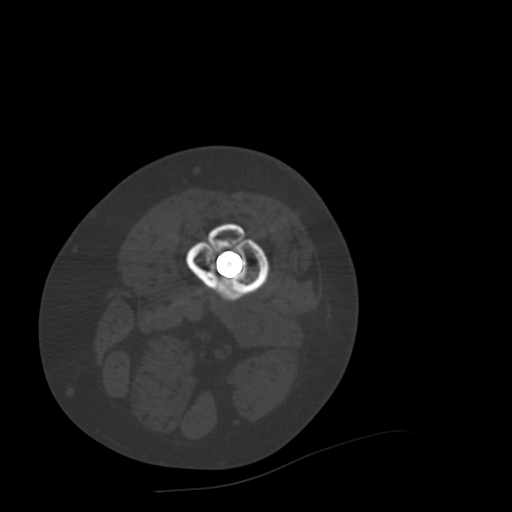
[im 105/227  bone]
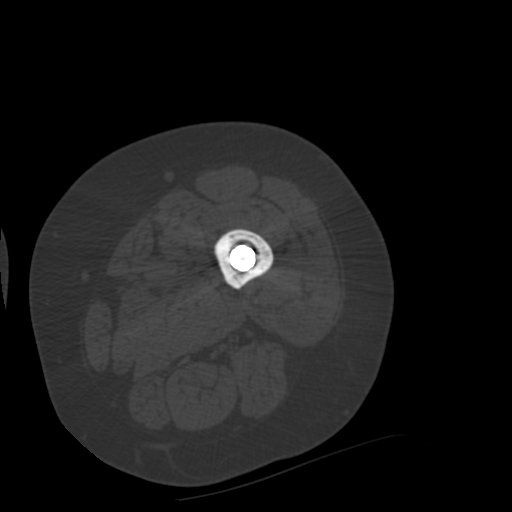
[im 122/227  soft-tissue]
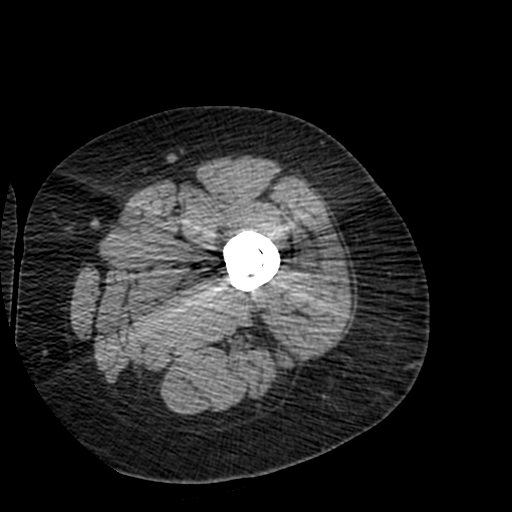
[im 122/227  bone]
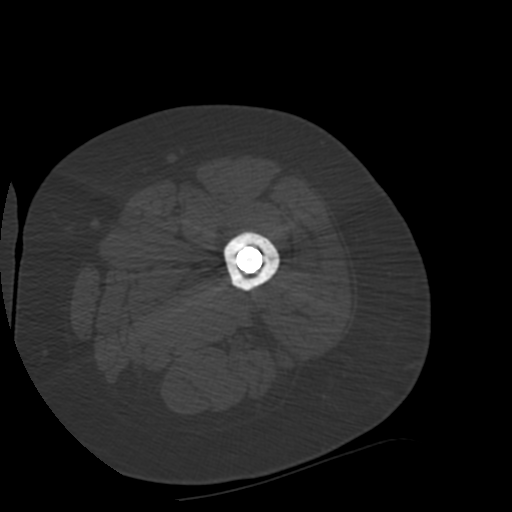
[im 157/227  bone]
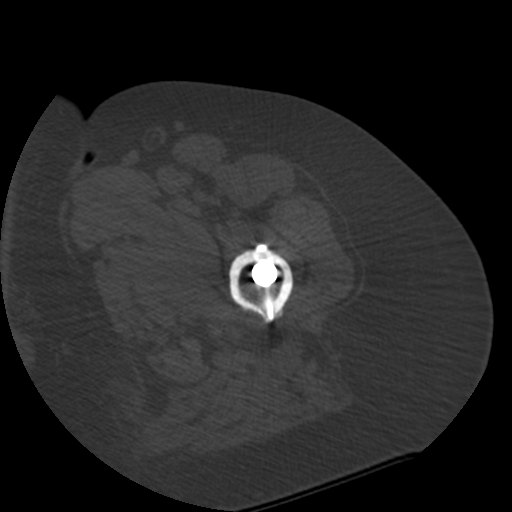
[im 174/227  bone]
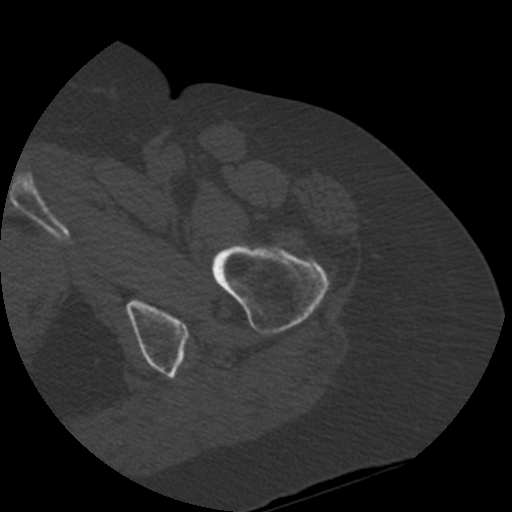
[im 209/227  bone]
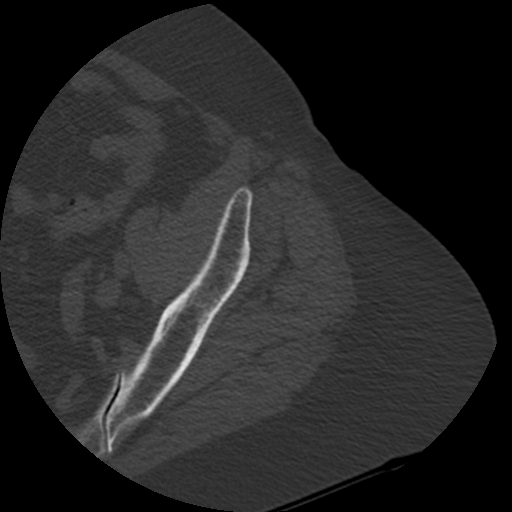

[Series 200: procecor soft · coronal · 1.13mm/px · 3 of 108 slices shown]
[im 22/108  bone]
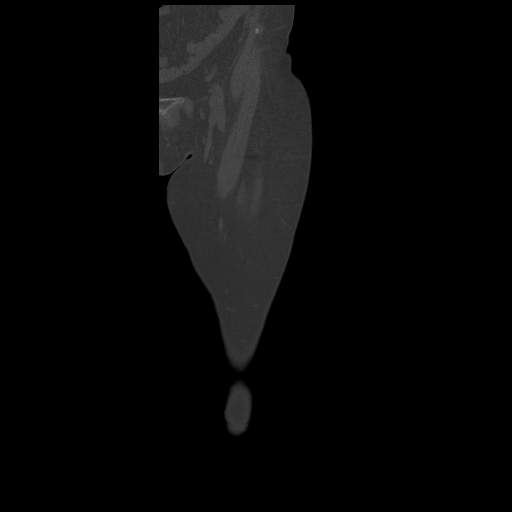
[im 43/108  bone]
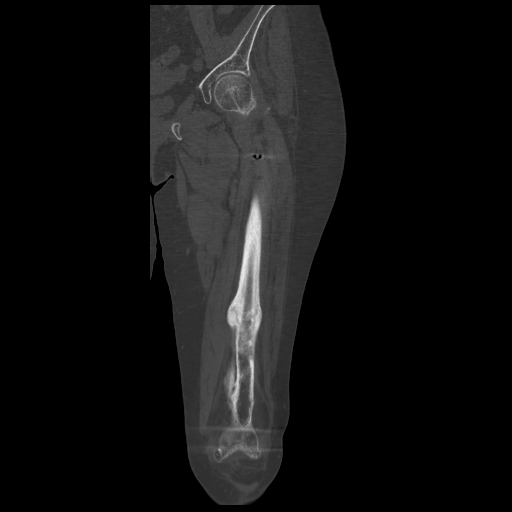
[im 65/108  bone]
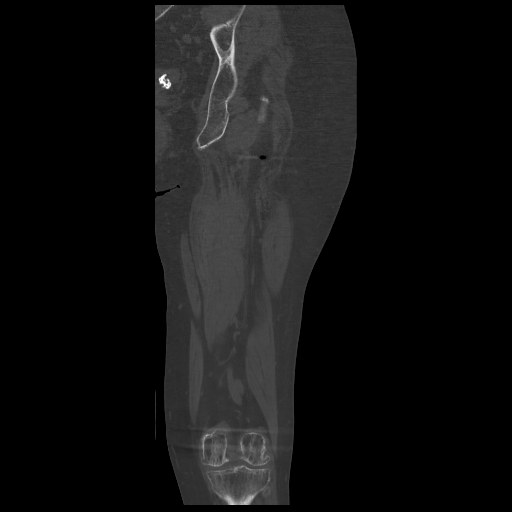

[Series 201: sag soft · sagittal · 1.13mm/px · 5 of 115 slices shown, 6 images]
[im 39/115  bone]
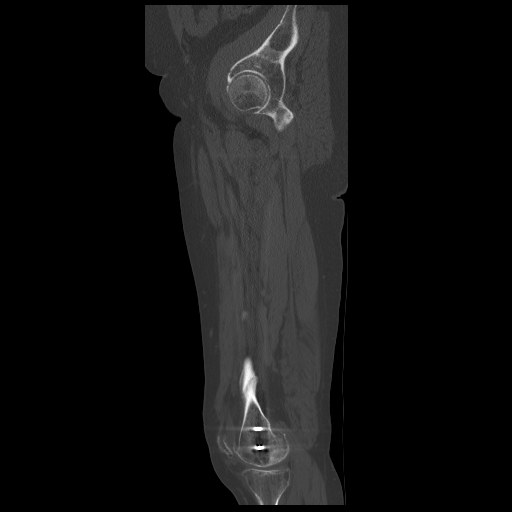
[im 48/115  bone]
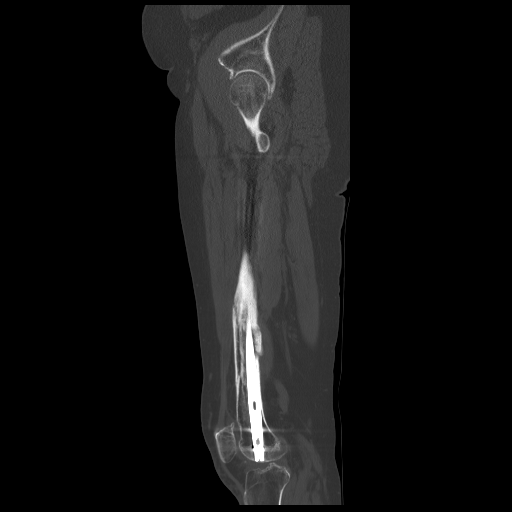
[im 58/115  soft-tissue]
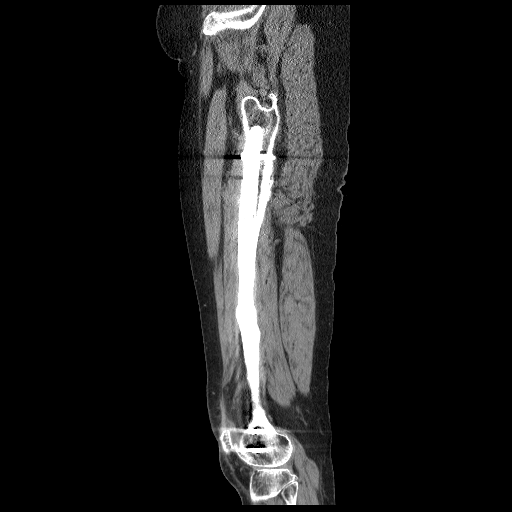
[im 58/115  bone]
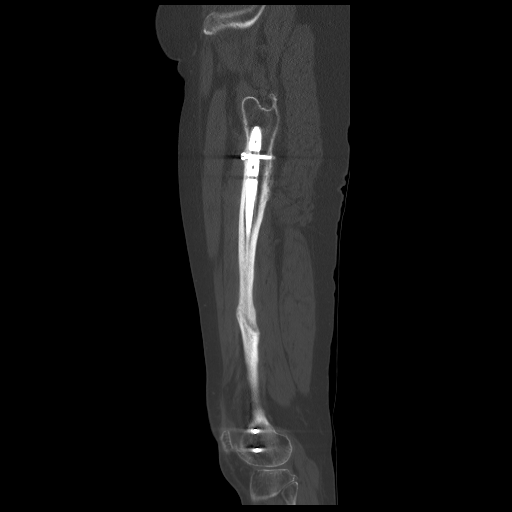
[im 67/115  bone]
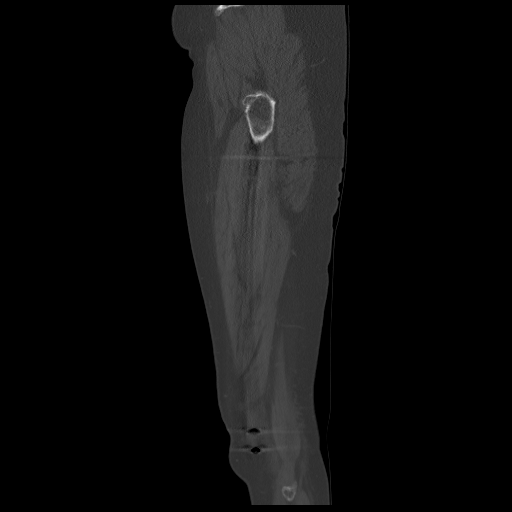
[im 77/115  bone]
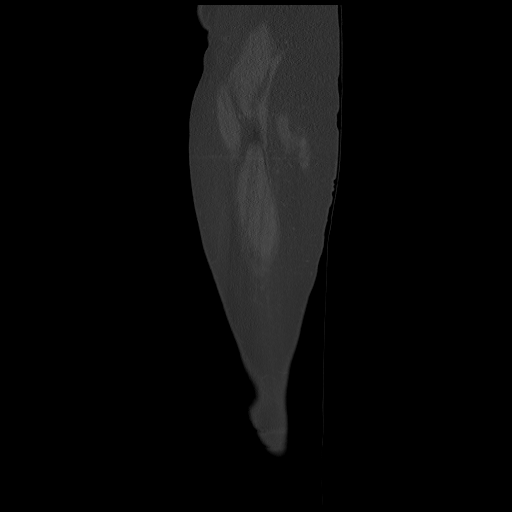

[16 of 33 positions shown; findings below may reference images not displayed]

FINDINGS: Visceral pelvis shows a calcified fibroid in the left side of the
uterine fundus. Left hip joint appears within normal limits. There
is a retrograde left femoral nail with proximal and distal
interlocking screws. This traverses an old distal femoral diaphysis
fracture. There was probably a medial butterfly fragment, which is
now united. There is bridging bone across the fracture. Fracture
planes remain faintly visible. Portion of the cortex remains open
posteriorly at the fracture site.

Incidental visualization of the knee demonstrates moderate
tricompartmental osteoarthritis without effusion. Anterior and
posterior muscular compartments of the thigh appear within normal
limits. Adductor compartment appears normal. Partially visualized
sacroiliac joint degenerative disease with vacuum joint.
IMPRESSION: 1. Uncomplicated retrograde left femoral nail with proximal and
distal interlocking screws.
2. Healed comminuted distal femur fracture with bridging bone.

## 2015-08-24 ENCOUNTER — Ambulatory Visit
Admission: RE | Admit: 2015-08-24 | Discharge: 2015-08-24 | Disposition: A | Discharge status: Home / Self Care | Payer: Medicare Other | Source: Ambulatory Visit | Attending: Allergy | Admitting: Allergy

## 2015-08-24 ENCOUNTER — Other Ambulatory Visit: Payer: Self-pay | Admitting: Allergy

## 2015-08-24 DIAGNOSIS — J209 Acute bronchitis, unspecified: Secondary | ICD-10-CM

## 2016-02-24 ENCOUNTER — Encounter (HOSPITAL_COMMUNITY): Payer: Self-pay

## 2016-02-24 ENCOUNTER — Observation Stay (HOSPITAL_COMMUNITY)
Admission: EM | Admit: 2016-02-24 | Discharge: 2016-02-25 | Disposition: A | Discharge status: Home / Self Care | Payer: Medicare Other | Attending: Internal Medicine | Admitting: Internal Medicine

## 2016-02-24 ENCOUNTER — Emergency Department (HOSPITAL_COMMUNITY): Payer: Medicare Other

## 2016-02-24 DIAGNOSIS — Z880 Allergy status to penicillin: Secondary | ICD-10-CM | POA: Diagnosis not present

## 2016-02-24 DIAGNOSIS — R251 Tremor, unspecified: Secondary | ICD-10-CM | POA: Diagnosis not present

## 2016-02-24 DIAGNOSIS — G500 Trigeminal neuralgia: Secondary | ICD-10-CM | POA: Insufficient documentation

## 2016-02-24 DIAGNOSIS — E669 Obesity, unspecified: Secondary | ICD-10-CM | POA: Insufficient documentation

## 2016-02-24 DIAGNOSIS — Z85828 Personal history of other malignant neoplasm of skin: Secondary | ICD-10-CM | POA: Diagnosis not present

## 2016-02-24 DIAGNOSIS — M1990 Unspecified osteoarthritis, unspecified site: Secondary | ICD-10-CM | POA: Insufficient documentation

## 2016-02-24 DIAGNOSIS — E7800 Pure hypercholesterolemia, unspecified: Secondary | ICD-10-CM | POA: Diagnosis present

## 2016-02-24 DIAGNOSIS — Z8249 Family history of ischemic heart disease and other diseases of the circulatory system: Secondary | ICD-10-CM | POA: Diagnosis not present

## 2016-02-24 DIAGNOSIS — Z888 Allergy status to other drugs, medicaments and biological substances status: Secondary | ICD-10-CM | POA: Insufficient documentation

## 2016-02-24 DIAGNOSIS — I10 Essential (primary) hypertension: Secondary | ICD-10-CM | POA: Diagnosis present

## 2016-02-24 DIAGNOSIS — N841 Polyp of cervix uteri: Secondary | ICD-10-CM | POA: Diagnosis not present

## 2016-02-24 DIAGNOSIS — F419 Anxiety disorder, unspecified: Secondary | ICD-10-CM | POA: Insufficient documentation

## 2016-02-24 DIAGNOSIS — E785 Hyperlipidemia, unspecified: Secondary | ICD-10-CM | POA: Diagnosis not present

## 2016-02-24 DIAGNOSIS — N200 Calculus of kidney: Secondary | ICD-10-CM | POA: Insufficient documentation

## 2016-02-24 DIAGNOSIS — Z6830 Body mass index (BMI) 30.0-30.9, adult: Secondary | ICD-10-CM | POA: Insufficient documentation

## 2016-02-24 DIAGNOSIS — R0789 Other chest pain: Secondary | ICD-10-CM

## 2016-02-24 DIAGNOSIS — K219 Gastro-esophageal reflux disease without esophagitis: Secondary | ICD-10-CM | POA: Diagnosis not present

## 2016-02-24 DIAGNOSIS — Z882 Allergy status to sulfonamides status: Secondary | ICD-10-CM | POA: Insufficient documentation

## 2016-02-24 DIAGNOSIS — Z791 Long term (current) use of non-steroidal anti-inflammatories (NSAID): Secondary | ICD-10-CM | POA: Insufficient documentation

## 2016-02-24 DIAGNOSIS — K2970 Gastritis, unspecified, without bleeding: Principal | ICD-10-CM

## 2016-02-24 DIAGNOSIS — E559 Vitamin D deficiency, unspecified: Secondary | ICD-10-CM | POA: Insufficient documentation

## 2016-02-24 DIAGNOSIS — I700 Atherosclerosis of aorta: Secondary | ICD-10-CM | POA: Insufficient documentation

## 2016-02-24 DIAGNOSIS — R079 Chest pain, unspecified: Secondary | ICD-10-CM | POA: Insufficient documentation

## 2016-02-24 DIAGNOSIS — Z79899 Other long term (current) drug therapy: Secondary | ICD-10-CM | POA: Insufficient documentation

## 2016-02-24 DIAGNOSIS — Z7982 Long term (current) use of aspirin: Secondary | ICD-10-CM | POA: Diagnosis not present

## 2016-02-24 DIAGNOSIS — I071 Rheumatic tricuspid insufficiency: Secondary | ICD-10-CM | POA: Insufficient documentation

## 2016-02-24 DIAGNOSIS — J309 Allergic rhinitis, unspecified: Secondary | ICD-10-CM | POA: Insufficient documentation

## 2016-02-24 HISTORY — DX: Trigeminal neuralgia: G50.0

## 2016-02-24 LAB — TROPONIN I
Troponin I: <0.03 ng/mL (ref <0.03)
Troponin I: <0.03 ng/mL (ref <0.03)
Troponin I: <0.03 ng/mL (ref <0.03)
Troponin I: <0.03 ng/mL (ref <0.03)

## 2016-02-24 LAB — CBC
HCT: 39.5 % (ref 36.0–46.0)
Hemoglobin: 13.3 g/dL (ref 12.0–15.0)
MCH: 29.6 pg (ref 26.0–34.0)
MCHC: 33.7 g/dL (ref 30.0–36.0)
MCV: 88 fL (ref 78.0–100.0)
Platelets: 241 10*3/uL (ref 150–400)
RBC: 4.49 MIL/uL (ref 3.87–5.11)
RDW: 12.8 % (ref 11.5–15.5)
WBC: 7.1 10*3/uL (ref 4.0–10.5)

## 2016-02-24 LAB — HEPATIC FUNCTION PANEL
ALT: 38 U/L (ref 14–54)
AST: 39 U/L (ref 15–41)
Albumin: 4 g/dL (ref 3.5–5.0)
Alkaline Phosphatase: 67 U/L (ref 38–126)
Bilirubin, Direct: 0.1 mg/dL (ref 0.1–0.5)
Indirect Bilirubin: 1.1 mg/dL — ABNORMAL HIGH (ref 0.3–0.9)
Total Bilirubin: 1.2 mg/dL (ref 0.3–1.2)
Total Protein: 7 g/dL (ref 6.5–8.1)

## 2016-02-24 LAB — BASIC METABOLIC PANEL
Anion gap: 6 (ref 5–15)
BUN: 16 mg/dL (ref 6–20)
CO2: 28 mmol/L (ref 22–32)
Calcium: 9.1 mg/dL (ref 8.9–10.3)
Chloride: 106 mmol/L (ref 101–111)
Creatinine, Ser: 0.76 mg/dL (ref 0.44–1.00)
GFR calc Af Amer: >60 mL/min (ref >60)
GFR calc non Af Amer: >60 mL/min (ref >60)
Glucose, Bld: 98 mg/dL (ref 65–99)
Potassium: 4.2 mmol/L (ref 3.5–5.1)
Sodium: 140 mmol/L (ref 135–145)

## 2016-02-24 LAB — LIPASE, BLOOD: Lipase: 27 U/L (ref 11–51)

## 2016-02-24 MED ORDER — PSYLLIUM 95 % PO PACK
1.0000 | PACK | Freq: Two times a day (BID) | ORAL | Status: DC
  Administered 2016-02-25: 1 via ORAL
  Filled 2016-02-24 (×2): qty 1

## 2016-02-24 MED ORDER — ONDANSETRON HCL 4 MG/2ML IJ SOLN: 4.0000 mg | Freq: Four times a day (QID) | INTRAMUSCULAR | Status: DC | PRN

## 2016-02-24 MED ORDER — FAMOTIDINE 20 MG PO TABS
20.0000 mg | ORAL_TABLET | Freq: Two times a day (BID) | ORAL | Status: DC
  Administered 2016-02-24 – 2016-02-25 (×2): 20 mg via ORAL
  Filled 2016-02-24 (×2): qty 1

## 2016-02-24 MED ORDER — ACETAMINOPHEN 325 MG PO TABS: 650.0000 mg | ORAL_TABLET | ORAL | Status: DC | PRN

## 2016-02-24 MED ORDER — PREDNISOLONE ACETATE 1 % OP SUSP
1.0000 [drp] | Freq: Four times a day (QID) | OPHTHALMIC | Status: DC
  Administered 2016-02-24: 1 [drp] via OPHTHALMIC

## 2016-02-24 MED ORDER — PROPRANOLOL HCL 10 MG PO TABS
10.0000 mg | ORAL_TABLET | Freq: Two times a day (BID) | ORAL | Status: DC
  Administered 2016-02-24 – 2016-02-25 (×2): 10 mg via ORAL
  Filled 2016-02-24 (×2): qty 1

## 2016-02-24 MED ORDER — ASPIRIN EC 325 MG PO TBEC
325.0000 mg | DELAYED_RELEASE_TABLET | Freq: Once | ORAL | Status: AC
  Administered 2016-02-24: 325 mg via ORAL
  Filled 2016-02-24: qty 1

## 2016-02-24 NOTE — H&P (Signed)
History and Physical    Barbara [name] [id number] DOB: Jun 27, 1950 DOA: 02/24/2016  PCP: Lilian Coma, MD   Patient coming from: Home  Chief Complaint: Chest pain  HPI: Barbara [name] is a 65 y.o. woman with a history of HTN (she is not on anti-hypertensives at this time), HLD (only treated with red yeast rice), GERD (only treated with OTC Tums prn for now), OA, benign tremor, and trigeminal neuralgia who presents to the ED accompanied by her husband for evaluation of chest pain.  The patient describes sudden onset of tightness in her chest while at rest that radiated into her jaw (she was sitting in her kitchen working on a laptop).  When asked to localize the origins of the pain, she actually points to her epigastric area, but indicates that the tightness radiated upward substernally and into her left jaw.  She had mild palpitations but no shortness of breath, nausea, diaphoresis, or significant light-headedness.  She took her blood pressure multiple times during the episode, which lasted approximately 10-15 minutes.  BP was elevated to 0000000 [id number] diastolic.  This concerned her, so she call her husband, who brought her in.  Symptoms were resolved at home; no specific interventions required.  She takes a baby ASA at night, but she did not take any additional aspirin for this chest pain episode.  Chest pain has not recurred.  She reports at least two other episodes of nonexertional chest pain in the past few months.  No known history of CAD.  She had a stress test 10+ years ago and some type of cardiac imaging with dye.  Reportedly, these tests were normal.  She says that the only recommendation that she received from the cardiologist at that time was to start the red yeast rice for her cholesterol.  She has significant acid reflux, managed with Tums.  Symptoms recently exacerbated by a one week course of ibuprofen as prescribed by an outpatient provider.  ED Course: EKG is  normal.  No acute ST changes.  Chest xray shows thoracic aorta atherosclerosis; otherwise, no acute process.  CBC and BMP are normal.  First troponin negative.  Review of Systems: As per HPI otherwise 10 point review of systems negative.    Past Medical History:  Diagnosis Date  . Allergy   . Anxiety   . Arthritis    knees  . Cancer ([place])    basal cell- nose  . GERD (gastroesophageal reflux disease)   . History of skin cancer   . Hyperlipidemia   . Hypertension   . Knee pain, left   . Neuromuscular disorder ([place])    benign tremor- takes Propanolol  . Occasional tremors   . PONV (postoperative nausea and vomiting)    "BP DROPS ALSO"  . Trigeminal neuralgia of right side of face     Past Surgical History:  Procedure Laterality Date  . fractured femur  05/2012   left - rod -screws placed  . HARDWARE REMOVAL Left 04/16/2013   Procedure: REMOVAL OF HARDWARE OF LEFT KNEE (DISTAL INTERLOC SCREW);  Surgeon: Gearlean Alf, MD;  Location: [facility];  Service: Orthopedics;  Laterality: Left;  . KNEE SURGERY     x 3 left / x1 rt knee  . NASAL SINUS SURGERY     2000  . SHOULDER SURGERY     bilateral shoulders   . WISDOM TOOTH EXTRACTION       reports that she has never smoked. She has never used smokeless tobacco.  She reports that she drinks alcohol. She reports that she does not use drugs.  Allergies  Allergen Reactions  . Ace Inhibitors     Pt unsure- nausea or hives  . Amoxicillin Hives  . Celebrex [Celecoxib] Other (See Comments)    Severe stomach cramps  . Enalapril Other (See Comments)    congestion  . Fentanyl Nausea And Vomiting  . Fish Oil Other (See Comments)    Broke out with blisters  . Other Nausea And Vomiting    Narcotic Pain Killers- vomitting, nausea constantly  . Penicillins Hives    Has patient had a PCN reaction causing immediate rash, facial/tongue/throat swelling, SOB or lightheadedness with hypotension: unknown Has patient had a PCN reaction causing  severe rash involving mucus membranes or skin necrosis: no Has patient had a PCN reaction that required hospitalization - no, was at MD office Has patient had a PCN reaction occurring within the last 10 years: no If all of the above answers are "NO", then may proceed with Cephalosporin use.   Marland Kitchen Propoxyphene Hcl Nausea Only  . Sulfonamide Derivatives Nausea Only and Other (See Comments)    Stomach cramping  . Synvisc [Hylan G-F 20]     Made knee swell up at injection site    Family History  Problem Relation Age of Onset  . Stomach cancer Mother   . Colon cancer Neg Hx   . Esophageal cancer Neg Hx   . Rectal cancer Neg Hx   Mother had an MI in her 44's. Maternal aunt had CHF.   Prior to Admission medications   Medication Sig Start Date End Date Taking? Authorizing Provider  acetaminophen (TYLENOL) 500 MG tablet Take 1,000 mg by mouth every 6 (six) hours as needed for mild pain or moderate pain.    Yes Historical Provider, MD  albuterol (PROVENTIL HFA;VENTOLIN HFA) 108 (90 Base) MCG/ACT inhaler Inhale 2 puffs into the lungs every 6 (six) hours as needed for wheezing or shortness of breath.   Yes Historical Provider, MD  Ascorbic Acid (VITAMIN C) 1000 MG tablet Take 1,000 mg by mouth daily.   Yes Historical Provider, MD  aspirin EC 81 MG tablet Take 81 mg by mouth at bedtime.    Yes Historical Provider, MD  beta carotene w/minerals (OCUVITE) tablet Take 1 tablet by mouth daily.   Yes Historical Provider, MD  CALCIUM-VITAMIN D PO Take 1,200 mg by mouth daily.   Yes Historical Provider, MD  Cholecalciferol (VITAMIN D-3) 1000 UNITS CAPS Take 1 capsule by mouth daily.   Yes Historical Provider, MD  cyanocobalamin 1000 MCG tablet Take 5,000 mcg by mouth daily.   Yes Historical Provider, MD  guaiFENesin (MUCINEX) 600 MG 12 hr tablet Take 600 mg by mouth daily as needed for congestion.   Yes Historical Provider, MD  levocetirizine (XYZAL) 5 MG tablet Take 5 mg by mouth every evening.   Yes  Historical Provider, MD  loratadine (CLARITIN) 10 MG tablet Take 10 mg by mouth daily as needed for allergies.   Yes Historical Provider, MD  milk thistle 175 MG tablet Take 175 mg by mouth 2 (two) times daily.   Yes Historical Provider, MD  prednisoLONE acetate (PRED FORTE) 1 % ophthalmic suspension Place 1 drop into both eyes 4 (four) times daily.   Yes Historical Provider, MD  propranolol (INDERAL) 10 MG tablet Take 10 mg by mouth 3 (three) times daily as needed (tremors). Pt takes Twice a day daily, and if needed for tremors may take  an extra dose.   Yes Historical Provider, MD  psyllium (METAMUCIL SMOOTH TEXTURE) 28 % packet Take 1 packet by mouth 2 (two) times daily.    Yes Historical Provider, MD  pyridOXINE (VITAMIN B-6) 100 MG tablet Take 100 mg by mouth daily.   Yes Historical Provider, MD  Red Yeast Rice 600 MG TABS Take 1 tablet by mouth 2 (two) times daily.   Yes Historical Provider, MD  vitamin C (ASCORBIC ACID) 500 MG tablet Take 500 mg by mouth daily.   Yes Historical Provider, MD    Physical Exam: Vitals:   02/24/16 1259 02/24/16 1300 02/24/16 1514 02/24/16 1812  BP:  150/83 149/82 151/82  Pulse:   64 70  Resp:   18 16  Temp:      TempSrc:      SpO2:   99% 98%  Weight: 79.4 kg (175 lb)     Height: 5\' 5"  (1.651 m)         Constitutional: NAD, calm, comfortable Vitals:   02/24/16 1259 02/24/16 1300 02/24/16 1514 02/24/16 1812  BP:  150/83 149/82 151/82  Pulse:   64 70  Resp:   18 16  Temp:      TempSrc:      SpO2:   99% 98%  Weight: 79.4 kg (175 lb)     Height: 5\' 5"  (1.651 m)      Eyes: PERRL, lids and conjunctivae normal ENMT: Mucous membranes are moist. Posterior pharynx clear of any exudate or lesions. Normal dentition.  Neck: normal appearance, supple Respiratory: clear to auscultation bilaterally, no wheezing, no crackles. Normal respiratory effort. No accessory muscle use.  Cardiovascular: Normal rate, regular rhythm, no murmurs / rubs / gallops. No  extremity edema. 2+ pedal pulses. No carotid bruits.  GI: abdomen is soft and compressible.  Mild tenderness to palpation in her epigastric area but no significant guarding.  No rebound tenderness.  No distention.  Bowel sounds are present. Musculoskeletal:  No joint deformity in upper and lower extremities. Good ROM, no contractures. Normal muscle tone.  Skin: no rashes, warm and dry Neurologic: No focal deficits. Psychiatric: Normal judgment and insight. Alert and oriented x 3. Normal mood.     Labs on Admission: I have personally reviewed following labs and imaging studies  CBC:  Recent Labs Lab 02/24/16 1348  WBC 7.1  HGB 13.3  HCT 39.5  MCV 88.0  PLT [id number]   Basic Metabolic Panel:  Recent Labs Lab 02/24/16 1348  NA 140  K 4.2  CL 106  CO2 28  GLUCOSE 98  BUN 16  CREATININE 0.76  CALCIUM 9.1   GFR: Estimated Creatinine Clearance: 72.1 mL/min (by C-G formula based on SCr of 0.76 mg/dL).   Recent Labs Lab 02/24/16 1348  TROPONINI <0.03    Radiological Exams on Admission: Dg Chest 2 View  Result Date: 02/24/2016 CLINICAL DATA:  Chest pain, hypertension, hyperlipidemia, nonsmoker. EXAM: CHEST  2 VIEW COMPARISON:  PA and lateral chest x-ray dated August 24, 2015 FINDINGS: The lungs are well-expanded and clear. The heart and pulmonary vascularity are normal. The mediastinum is normal in width. There is no pleural effusion. The trachea is midline. There is calcification in the wall of the aortic arch. There is multilevel degenerative disc disease of the thoracic spine. The patient has undergone previous left-sided rotator cuff repair. IMPRESSION: There is no active cardiopulmonary disease. Thoracic aortic atherosclerosis. Electronically Signed   By: David  Martinique M.D.   On: 02/24/2016 13:46  EKG: Independently reviewed. Noted above.  Assessment/Plan Principal Problem:   Chest pain Active Problems:   HYPERCHOLESTEROLEMIA   Essential hypertension      Chest  pain with multiple cardiac risk factors.  Heart score of 5. --Place in observation with telemetry monitoring --One full strength aspirin now --Serial troponin --Complete echo in the AM --NPO after midnight in case she is a candidate for stress test (has not been ordered).  Will call cardiology to discuss case if she develops a positive troponin tonight. --Fasting lipid panel in the AM for risk factor stratification --Monitor BP trend; does not appear that she is on any type of daily anti-hypertensive regimen  History of GERD with recent exacerbation.  Patient indicates some epigastric pain. --Trial of pepcid BID --Check LFTs, lipase --Her chest discomfort could certainly be GI related.  History of HTN --Likely under treated.  May need a trial of low dow anti-hypertensive at discharge.  She seems reluctant to take many prescription drugs; voices concerns about side effects and her body's "sensitivity"  History of HLD --Check fasting lipid panel --Will hold her red yeast rice for now  Of note, will hold most of her home medications, which are supplements.  She will use her own eyedrops.   DVT prophylaxis: Low risk, outpatient status Code Status: FULL Family Communication: Husband at bedside in the ED at time of admission. Disposition Plan: Expect she will go home when cardiac evaluation is complete. Consults called: NONE Admission status: Place in observation with telemetry monitoring   TIME SPENT: 60 minutes   Eber Jones MD [facility] Pager [phone]  If 7PM-7AM, please contact night-coverage [email or web address] Password [facility]  02/24/2016, 6:56 PM

## 2016-02-24 NOTE — ED Triage Notes (Signed)
Patient states she was on the computer and had a squeezing mid chest pain that lasted approx 10 mins.and pain radiated into the jaw/ patient states has had CP x 3 in the past 6-10 months.

## 2016-02-24 NOTE — ED Notes (Signed)
WILL TRANSPORT PT TO 4E 1403-1. AAOX4. PT IN NO APPARENT DISTRESS OR PAIN. THE OPPORTUNITY TO ASK QUESTIONS WAS PROVIDED. 

## 2016-02-25 ENCOUNTER — Observation Stay (HOSPITAL_BASED_OUTPATIENT_CLINIC_OR_DEPARTMENT_OTHER): Payer: Medicare Other

## 2016-02-25 DIAGNOSIS — E7800 Pure hypercholesterolemia, unspecified: Secondary | ICD-10-CM

## 2016-02-25 DIAGNOSIS — I10 Essential (primary) hypertension: Secondary | ICD-10-CM

## 2016-02-25 DIAGNOSIS — R079 Chest pain, unspecified: Secondary | ICD-10-CM | POA: Diagnosis not present

## 2016-02-25 DIAGNOSIS — K2970 Gastritis, unspecified, without bleeding: Secondary | ICD-10-CM

## 2016-02-25 LAB — LIPID PANEL
Cholesterol: 169 mg/dL (ref 0–200)
HDL: 39 mg/dL — ABNORMAL LOW (ref >40)
LDL Cholesterol: 100 mg/dL — ABNORMAL HIGH (ref 0–99)
Total CHOL/HDL Ratio: 4.3 RATIO
Triglycerides: 151 mg/dL — ABNORMAL HIGH (ref <150)
VLDL: 30 mg/dL (ref 0–40)

## 2016-02-25 LAB — ECHOCARDIOGRAM COMPLETE
Height: 65 in
Weight: 2940.8 oz

## 2016-02-25 LAB — TROPONIN I: Troponin I: <0.03 ng/mL (ref <0.03)

## 2016-02-25 MED ORDER — PANTOPRAZOLE SODIUM 40 MG PO TBEC
40.0000 mg | DELAYED_RELEASE_TABLET | Freq: Every day | ORAL | Status: DC
  Administered 2016-02-25: 40 mg via ORAL
  Filled 2016-02-25: qty 1

## 2016-02-25 MED ORDER — OMEPRAZOLE 20 MG PO CPDR: 20.0000 mg | DELAYED_RELEASE_CAPSULE | Freq: Two times a day (BID) | ORAL | 0 refills | Status: DC

## 2016-02-25 NOTE — Progress Notes (Signed)
Patient is alert, oriented x 4. Ambulatory without assist. Discharge instructions reviewed. Questions, concerns denied.

## 2016-02-25 NOTE — ED Provider Notes (Signed)
[facility] DEPT Provider Note   CSN: [id number] Arrival date & time: 02/24/16  1249     History   Chief Complaint Chief Complaint  Patient presents with  . Chest Pain    HPI Barbara [name] is a 65 y.o. female.  Barbara [name] is a 65 y.o. female with h/o anxiety, GERD, HLD, HTN, OA presents to ED with complaint of chest pain. Patient reports she was sitting at her computer when she had an onset of centralized chest pain described as a pressure/squeezing/tightness that radiated into her jaw. Pain lasted approximately 10-15 minutes. She endorses associated palpitations. She reports taking her BP during the episode and noted pressures in the 170s/90-100s. Patient did not take anything for her sxs. She reports she has had similar episodes in the past, but unclear in terms of a pattern. She denies diaphoresis, SOB, nausea, dizziness, lightheadedness, or LOC. No recent long distance travel/surgery/hospitalization, h/o blood clot, h/o cancer, hormone therapy, hemoptysis, lower extremity pain. She does report she broke her femur in 2014 and was placed on anti-coagulation therapy prophylactically, but is not currently on any anti-coagulation. Family h/o cardiac conditions - mother had first heart attack in 68s. Of note, patient does endorse b/l eye redness, she is being followed by an ophthalmologist and is currently on topical steroids drops.       Past Medical History:  Diagnosis Date  . Allergy   . Anxiety   . Arthritis    knees  . Cancer ([place])    basal cell- nose  . GERD (gastroesophageal reflux disease)   . History of skin cancer   . Hyperlipidemia   . Hypertension   . Knee pain, left   . Neuromuscular disorder ([place])    benign tremor- takes Propanolol  . Occasional tremors   . PONV (postoperative nausea and vomiting)    "BP DROPS ALSO"  . Trigeminal neuralgia of right side of face     Patient Active Problem List   Diagnosis Date Noted  . Chest pain 02/24/2016  .  Painful orthopaedic hardware ([place]) 04/15/2013  . VITAMIN D DEFICIENCY 12/09/2009  . HYPERCHOLESTEROLEMIA 12/09/2009  . Essential hypertension 12/09/2009  . ALLERGIC RHINITIS 12/09/2009  . NEPHROLITHIASIS 12/09/2009  . CERVICAL POLYP 12/09/2009  . OSTEOARTHRITIS 12/09/2009  . COUGH 12/09/2009    Past Surgical History:  Procedure Laterality Date  . fractured femur  05/2012   left - rod -screws placed  . HARDWARE REMOVAL Left 04/16/2013   Procedure: REMOVAL OF HARDWARE OF LEFT KNEE (DISTAL INTERLOC SCREW);  Surgeon: Gearlean Alf, MD;  Location: [facility]S;  Service: Orthopedics;  Laterality: Left;  . KNEE SURGERY     x 3 left / x1 rt knee  . NASAL SINUS SURGERY     2000  . SHOULDER SURGERY     bilateral shoulders   . WISDOM TOOTH EXTRACTION      OB History    No data available       Home Medications    Prior to Admission medications   Medication Sig Start Date End Date Taking? Authorizing Provider  acetaminophen (TYLENOL) 500 MG tablet Take 1,000 mg by mouth every 6 (six) hours as needed for mild pain or moderate pain.    Yes Historical Provider, MD  albuterol (PROVENTIL HFA;VENTOLIN HFA) 108 (90 Base) MCG/ACT inhaler Inhale 2 puffs into the lungs every 6 (six) hours as needed for wheezing or shortness of breath.   Yes Historical Provider, MD  Ascorbic Acid (VITAMIN C) 1000  MG tablet Take 1,000 mg by mouth daily.   Yes Historical Provider, MD  aspirin EC 81 MG tablet Take 81 mg by mouth at bedtime.    Yes Historical Provider, MD  beta carotene w/minerals (OCUVITE) tablet Take 1 tablet by mouth daily.   Yes Historical Provider, MD  CALCIUM-VITAMIN D PO Take 1,200 mg by mouth daily.   Yes Historical Provider, MD  Cholecalciferol (VITAMIN D-3) 1000 UNITS CAPS Take 1 capsule by mouth daily.   Yes Historical Provider, MD  cyanocobalamin 1000 MCG tablet Take 5,000 mcg by mouth daily.   Yes Historical Provider, MD  guaiFENesin (MUCINEX) 600 MG 12 hr tablet Take 600 mg by mouth daily as  needed for congestion.   Yes Historical Provider, MD  levocetirizine (XYZAL) 5 MG tablet Take 5 mg by mouth every evening.   Yes Historical Provider, MD  loratadine (CLARITIN) 10 MG tablet Take 10 mg by mouth daily as needed for allergies.   Yes Historical Provider, MD  milk thistle 175 MG tablet Take 175 mg by mouth 2 (two) times daily.   Yes Historical Provider, MD  prednisoLONE acetate (PRED FORTE) 1 % ophthalmic suspension Place 1 drop into both eyes 4 (four) times daily.   Yes Historical Provider, MD  propranolol (INDERAL) 10 MG tablet Take 10 mg by mouth 3 (three) times daily as needed (tremors). Pt takes Twice a day daily, and if needed for tremors may take an extra dose.   Yes Historical Provider, MD  psyllium (METAMUCIL SMOOTH TEXTURE) 28 % packet Take 1 packet by mouth 2 (two) times daily.    Yes Historical Provider, MD  pyridOXINE (VITAMIN B-6) 100 MG tablet Take 100 mg by mouth daily.   Yes Historical Provider, MD  Red Yeast Rice 600 MG TABS Take 1 tablet by mouth 2 (two) times daily.   Yes Historical Provider, MD  vitamin C (ASCORBIC ACID) 500 MG tablet Take 500 mg by mouth daily.   Yes Historical Provider, MD    Family History Family History  Problem Relation Age of Onset  . Stomach cancer Mother   . Colon cancer Neg Hx   . Esophageal cancer Neg Hx   . Rectal cancer Neg Hx     Social History Social History  Substance Use Topics  . Smoking status: Never Smoker  . Smokeless tobacco: Never Used  . Alcohol use Yes     Comment: RARE     Allergies   Ace inhibitors; Amoxicillin; Celebrex [celecoxib]; Enalapril; Fentanyl; Fish oil; Other; Penicillins; Propoxyphene hcl; Sulfonamide derivatives; and Synvisc [hylan g-f 20]   Review of Systems Review of Systems  Constitutional: Negative for chills, diaphoresis and fever.  HENT: Negative for trouble swallowing.   Eyes: Positive for redness.  Respiratory: Negative for cough, shortness of breath and wheezing.   Cardiovascular:  Positive for chest pain ( resolved) and palpitations ( resolved). Negative for leg swelling.  Gastrointestinal: Negative for abdominal pain, nausea and vomiting.  Genitourinary: Negative for dysuria and hematuria.  Musculoskeletal: Negative for arthralgias and myalgias.  Skin: Negative for rash.  Neurological: Negative for dizziness, syncope and light-headedness.    Physical Exam Updated Vital Signs BP 138/75 (BP Location: Left Arm)   Pulse 77   Temp 98 F (36.7 C) (Oral)   Resp 16   Ht 5\' 5"  (1.651 m)   Wt 83.4 kg   SpO2 95%   BMI 30.59 kg/m   Physical Exam  Constitutional: She appears well-developed and well-nourished. No distress.  HENT:  Head: Normocephalic and atraumatic.  Mouth/Throat: Oropharynx is clear and moist. No oropharyngeal exudate.  Eyes: EOM are normal. Pupils are equal, round, and reactive to light. Right eye exhibits no discharge. Left eye exhibits no discharge. Right conjunctiva is injected. Left conjunctiva is injected. No scleral icterus.  Neck: Normal range of motion and phonation normal. Neck supple. No neck rigidity. Normal range of motion present.  Cardiovascular: Normal rate, regular rhythm, normal heart sounds and intact distal pulses.   No murmur heard. Pulmonary/Chest: Effort normal and breath sounds normal. No stridor. No respiratory distress. She has no wheezes. She has no rales.  Abdominal: Soft. Bowel sounds are normal. She exhibits no distension. There is no tenderness. There is no rigidity, no rebound, no guarding and no CVA tenderness.  Musculoskeletal: Normal range of motion.  No lower extremity swelling. No posterior calf tenderness.  Lymphadenopathy:    She has no cervical adenopathy.  Neurological: She is alert. She is not disoriented. Coordination and gait normal. GCS eye subscore is 4. GCS verbal subscore is 5. GCS motor subscore is 6.  Skin: Skin is warm and dry. She is not diaphoretic.  Psychiatric: She has a normal mood and affect.  Her behavior is normal.     ED Treatments / Results  Labs (all labs ordered are listed, but only abnormal results are displayed) Labs Reviewed  HEPATIC FUNCTION PANEL - Abnormal; Notable for the following:       Result Value   Indirect Bilirubin 1.1 (*)    All other components within normal limits  BASIC METABOLIC PANEL  CBC  TROPONIN I  TROPONIN I  TROPONIN I  TROPONIN I  LIPASE, BLOOD  TROPONIN I  LIPID PANEL  I-STAT TROPOININ, ED    EKG  EKG Interpretation None       Radiology Dg Chest 2 View  Result Date: 02/24/2016 CLINICAL DATA:  Chest pain, hypertension, hyperlipidemia, nonsmoker. EXAM: CHEST  2 VIEW COMPARISON:  PA and lateral chest x-ray dated August 24, 2015 FINDINGS: The lungs are well-expanded and clear. The heart and pulmonary vascularity are normal. The mediastinum is normal in width. There is no pleural effusion. The trachea is midline. There is calcification in the wall of the aortic arch. There is multilevel degenerative disc disease of the thoracic spine. The patient has undergone previous left-sided rotator cuff repair. IMPRESSION: There is no active cardiopulmonary disease. Thoracic aortic atherosclerosis. Electronically Signed   By: David  Martinique M.D.   On: 02/24/2016 13:46    Procedures Procedures (including critical care time)  Medications Ordered in ED Medications  prednisoLONE acetate (PRED FORTE) 1 % ophthalmic suspension 1 drop (1 drop Both Eyes Given 02/24/16 2121)  propranolol (INDERAL) tablet 10 mg (10 mg Oral Given 02/24/16 2120)  acetaminophen (TYLENOL) tablet 650 mg (not administered)  ondansetron (ZOFRAN) injection 4 mg (not administered)  famotidine (PEPCID) tablet 20 mg (20 mg Oral Given 02/24/16 2120)  psyllium (HYDROCIL/METAMUCIL) packet 1 packet (1 packet Oral Not Given 02/25/16 0000)  aspirin EC tablet 325 mg (325 mg Oral Given 02/24/16 2120)     Initial Impression / Assessment and Plan / ED Course  I have reviewed the triage  vital signs and the nursing notes.  Pertinent labs & imaging results that were available during my care of the patient were reviewed by me and considered in my medical decision making (see chart for details).  Clinical Course as of Feb 24 246  Thu Feb 24, 2016  1554 DG Chest 2 View [AM]  Clinical Course User Index [AM] Roxanna Mew, PA-C   Patient presents to ED with complaint of CP onset this morning. Currently asx. Patient is afebrile and non-toxic appearing in NAD. VSS. Heart RRR. Lungs CTABL. Abdomen soft, non-tender. No lower extremity swelling or posterior calf tenderness. BMP and CBC unremarkable. Initial troponin negative. CXR shows no evidence of acute infiltrate, pleural effusion, or PTX. EKG shows sinus rhythm. Heart score 5. Given heart score will consult hospitalist for admission for ACS r/o.   Spoke with Dr. Eulas Post of [facility], greatly appreciate her time and input. Agree to admit patient for ACS r/o. Thank you for your continued care of this patient.   Final Clinical Impressions(s) / ED Diagnoses   Final diagnoses:  Chest pain, unspecified type    New Prescriptions Current Discharge Medication List       Roxanna Mew, PA-C 02/25/16 [place], MD 02/27/16 2008

## 2016-02-25 NOTE — Discharge Summary (Addendum)
Physician Discharge Summary  Barbara [name] [id number] DOB: 06/09/1950 DOA: 02/24/2016  PCP: Lilian Coma, MD  Admit date: 02/24/2016 Discharge date: 02/25/2016  Admitted From: Home  Disposition:  Home  Recommendations for Outpatient Follow-up:  1. Follow up with cardiology next week for stress test  Home Health:  None  Equipment/Devices:  None   Discharge Condition:  Stable, improved CODE STATUS:  Full code  Diet recommendation:  Healthy heart   Brief/Interim Summary:  The patient is a 65 year old female with history of hypertension, hyperlipidemia, GERD, and trigeminal neuralgia who presented to the emergency department after an episode of chest pain.  Her pain occurred at rest while she was working on her laptop. It started in the epigastric area and radiated up towards her left jaw. She had some mild palpitations but no shortness of breath, nausea, diaphoresis, or lightheadedness. Her chest pain resolved after a couple of minutes and did not recur. A couple of weeks ago she took high-dose ibuprofen and has had severe GERD symptoms and abdominal pains ever since. She takes Tums frequently for these symptoms.  Her EKG demonstrated no acute ST segment elevations.  Telemetry remained in normal sinus rhythm without arrhythmias.  Her troponins were repeatedly negative. Her chest x-ray demonstrated only thoracic aorta atherosclerosis but no acute process. She underwent an echocardiogram, which demonstrated no regional wall motion abnormalities, preserved EF, and grade 2 DD. She had no significant heart murmurs or signs of heart failure on exam. She elected to follow-up with cardiology as an outpatient next week for stress test because her cardiac risk score was 5.  Discharge Diagnoses:  Principal Problem:   Gastritis Active Problems:   HYPERCHOLESTEROLEMIA   Essential hypertension   Chest pain  Chest pain with multiple cardiac risk factors.  Heart score of 5.  After reviewing her  history, I suspect that she is having some gastritis and esophagitis related to her high-dose ibuprofen therapy. She has already stopped her NSAIDs but is only been using Tums for symptomatic management. I gave her prescription for omeprazole 20 mg by mouth twice a day to take for 1 month to see if that helps improve her symptoms. She was advised not to use NSAIDs. -  Continue low-dose aspirin and red yeast rice for her cholesterol -  LDL 100 -  A1c not obtained but CBG on labs was 98 and her bloodwork a month ago by her PCP was reportedly normal.   -  F/u with cardiology next week for stress test -  ECHO report below:  Grade 2 DD, preserved EF.  GERD and probable gastritis related to NSAID use.  Patient indicates some epigastric pain.  Trial of omeprazole.    HTN, blood pressure elevated initially but trended down spontaneously. Likely stress and pain related.  HLD, LDL 100, continue red yeast rice for now   Discharge Instructions  Discharge Instructions    Call MD for:  difficulty breathing, headache or visual disturbances    Complete by:  As directed    Call MD for:  persistant nausea and vomiting    Complete by:  As directed    Call MD for:  severe uncontrolled pain    Complete by:  As directed    Call MD for:  temperature >100.4    Complete by:  As directed    Diet - low sodium heart healthy    Complete by:  As directed    Increase activity slowly    Complete by:  As directed  Medication List    TAKE these medications   acetaminophen 500 MG tablet Commonly known as:  TYLENOL Take 1,000 mg by mouth every 6 (six) hours as needed for mild pain or moderate pain.   albuterol 108 (90 Base) MCG/ACT inhaler Commonly known as:  PROVENTIL HFA;VENTOLIN HFA Inhale 2 puffs into the lungs every 6 (six) hours as needed for wheezing or shortness of breath.   aspirin EC 81 MG tablet Take 81 mg by mouth at bedtime.   beta carotene w/minerals tablet Take 1 tablet by mouth  daily.   CALCIUM-VITAMIN D PO Take 1,200 mg by mouth daily.   cyanocobalamin 1000 MCG tablet Take 5,000 mcg by mouth daily.   guaiFENesin 600 MG 12 hr tablet Commonly known as:  MUCINEX Take 600 mg by mouth daily as needed for congestion.   loratadine 10 MG tablet Commonly known as:  CLARITIN Take 10 mg by mouth daily as needed for allergies.   milk thistle 175 MG tablet Take 175 mg by mouth 2 (two) times daily.   omeprazole 20 MG capsule Commonly known as:  PRILOSEC Take 1 capsule (20 mg total) by mouth 2 (two) times daily before a meal.   prednisoLONE acetate 1 % ophthalmic suspension Commonly known as:  PRED FORTE Place 1 drop into both eyes 4 (four) times daily.   propranolol 10 MG tablet Commonly known as:  INDERAL Take 10 mg by mouth 3 (three) times daily as needed (tremors). Pt takes Twice a day daily, and if needed for tremors may take an extra dose.   psyllium 28 % packet Commonly known as:  METAMUCIL SMOOTH TEXTURE Take 1 packet by mouth 2 (two) times daily.   pyridOXINE 100 MG tablet Commonly known as:  VITAMIN B-6 Take 100 mg by mouth daily.   Red Yeast Rice 600 MG Tabs Take 1 tablet by mouth 2 (two) times daily.   vitamin C 1000 MG tablet Take 1,000 mg by mouth daily.   vitamin C 500 MG tablet Commonly known as:  ASCORBIC ACID Take 500 mg by mouth daily.   Vitamin D-3 1000 units Caps Take 1 capsule by mouth daily.   XYZAL 5 MG tablet Generic drug:  levocetirizine Take 5 mg by mouth every evening.      Follow-up Information    Lilian Coma, MD Follow up.   Specialty:  Family Medicine Contact information: [place] [address] [phone]        [place]. Schedule an appointment as soon as possible for a visit in 1 week(s).   Why:  stress test Contact information: [place] [phone]          Allergies  Allergen Reactions  . Ace Inhibitors     Pt unsure- nausea or hives  . Amoxicillin Hives  . Celebrex [Celecoxib] Other (See Comments)    Severe stomach cramps  . Enalapril Other (See Comments)    congestion  . Fentanyl Nausea And Vomiting  . Fish Oil Other (See Comments)    Broke out with blisters  . Other Nausea And Vomiting    Narcotic Pain Killers- vomitting, nausea constantly  . Penicillins Hives    Has patient had a PCN reaction causing immediate rash, facial/tongue/throat swelling, SOB or lightheadedness with hypotension: unknown Has patient had a PCN reaction causing severe rash involving mucus membranes or skin necrosis: no Has patient had a PCN reaction that required  hospitalization - no, was at MD office Has patient had a PCN reaction occurring within the last 10 years: no If all of the above answers are "NO", then may proceed with Cephalosporin use.   Marland Kitchen Propoxyphene Hcl Nausea Only  . Sulfonamide Derivatives Nausea Only and Other (See Comments)    Stomach cramping  . Synvisc [Hylan G-F 20]     Made knee swell up at injection site    Consultations: None    Procedures/Studies: Dg Chest 2 View  Result Date: 02/24/2016 CLINICAL DATA:  Chest pain, hypertension, hyperlipidemia, nonsmoker. EXAM: CHEST  2 VIEW COMPARISON:  PA and lateral chest x-ray dated August 24, 2015 FINDINGS: The lungs are well-expanded and clear. The heart and pulmonary vascularity are normal. The mediastinum is normal in width. There is no pleural effusion. The trachea is midline. There is calcification in the wall of the aortic arch. There is multilevel degenerative disc disease of the thoracic spine. The patient has undergone previous left-sided rotator cuff repair. IMPRESSION: There is no active cardiopulmonary disease. Thoracic aortic atherosclerosis. Electronically Signed   By: David  Martinique M.D.   On: 02/24/2016 13:46    Echocardiogram:   Left ventricle: The cavity size was  normal. There was mild concentric hypertrophy. Systolic function was normal. The estimated ejection fraction was in the range of 60% to 65%. Wall motion was normal; there were no regional wall motion abnormalities. Features are consistent with a pseudonormal left ventricular filling pattern, with concomitant abnormal relaxation and increased filling pressure (grade 2 diastolic dysfunction). Doppler parameters are consistent with indeterminate ventricular filling pressure. - Aortic valve: There was no regurgitation. - Mitral valve: Transvalvular velocity was within the normal range. There was no evidence for stenosis. There was trivial regurgitation. - Right ventricle: The cavity size was normal. Wall thickness was normal. Systolic function was normal. - Tricuspid valve: There was mild regurgitation. - Pulmonary arteries: Systolic pressure was mildly increased. PA peak pressure: 38 mm Hg (S).   Subjective: No further episodes of chest pain. Feels normal this morning. No dyspnea, nausea, diaphoresis, lightheadedness  Discharge Exam: Vitals:   02/25/16 0548 02/25/16 1100  BP: (!) 130/99 136/78  Pulse: 91 76  Resp: 18 18  Temp: 98.2 F (36.8 C)    Vitals:   02/24/16 2030 02/25/16 0200 02/25/16 0548 02/25/16 1100  BP: 138/75 135/76 (!) 130/99 136/78  Pulse: 77 65 91 76  Resp: 16 18 18 18   Temp: 98 F (36.7 C) 98.1 F (36.7 C) 98.2 F (36.8 C)   TempSrc: Oral Oral Oral   SpO2: 95% 98% 98% 95%  Weight: 83.4 kg (183 lb 12.8 oz)     Height: 5\' 5"  (1.651 m)       General: Pt is alert, awake, not in acute distress Cardiovascular: RRR, S1/S2 +, no rubs, no gallops.  Mild tenderness at the xiphoid process Respiratory: CTA bilaterally, no wheezing, no rhonchi Abdominal: Soft, NT, ND, bowel sounds + Extremities: no edema, no cyanosis    The results of significant diagnostics from this hospitalization (including imaging, microbiology, ancillary and  laboratory) are listed below for reference.     Microbiology: No results found for this or any previous visit (from the past 240 hour(s)).   Labs: BNP (last 3 results) No results for input(s): BNP in the last 8760 hours. Basic Metabolic Panel:  Recent Labs Lab 02/24/16 1348  NA 140  K 4.2  CL 106  CO2 28  GLUCOSE 98  BUN 16  CREATININE 0.76  CALCIUM 9.1   Liver Function Tests:  Recent Labs Lab 02/24/16 2052  AST 39  ALT 38  ALKPHOS 67  BILITOT 1.2  PROT 7.0  ALBUMIN 4.0    Recent Labs Lab 02/24/16 2052  LIPASE 27   No results for input(s): AMMONIA in the last 168 hours. CBC:  Recent Labs Lab 02/24/16 1348  WBC 7.1  HGB 13.3  HCT 39.5  MCV 88.0  PLT 241   Cardiac Enzymes:  Recent Labs Lab 02/24/16 1348 02/24/16 1821 02/24/16 2052 02/24/16 2321 02/25/16 0323  TROPONINI <0.03 <0.03 <0.03 <0.03 <0.03   BNP: Invalid input(s): POCBNP CBG: No results for input(s): GLUCAP in the last 168 hours. D-Dimer No results for input(s): DDIMER in the last 72 hours. Hgb A1c No results for input(s): HGBA1C in the last 72 hours. Lipid Profile  Recent Labs  02/25/16 0323  CHOL 169  HDL 39*  LDLCALC 100*  TRIG 151*  CHOLHDL 4.3   Thyroid function studies No results for input(s): TSH, T4TOTAL, T3FREE, THYROIDAB in the last 72 hours.  Invalid input(s): FREET3 Anemia work up No results for input(s): VITAMINB12, FOLATE, FERRITIN, TIBC, IRON, RETICCTPCT in the last 72 hours. Urinalysis No results found for: COLORURINE, APPEARANCEUR, LABSPEC, [place], GLUCOSEU, HGBUR, BILIRUBINUR, KETONESUR, PROTEINUR, UROBILINOGEN, NITRITE, LEUKOCYTESUR Sepsis Labs Invalid input(s): PROCALCITONIN,  WBC,  LACTICIDVEN   Time coordinating discharge: Over 30 minutes  SIGNED:   Janece Canterbury, MD  [facility] 02/25/2016, 1:17 PM Pager   If 7PM-7AM, please contact night-coverage [email or web address] Password [email or web address]

## 2016-02-25 NOTE — Progress Notes (Signed)
  Echocardiogram 2D Echocardiogram has been performed.  Tresa Res 02/25/2016, 12:35 PM

## 2016-02-28 ENCOUNTER — Encounter: Payer: Self-pay | Admitting: Internal Medicine

## 2016-02-28 ENCOUNTER — Ambulatory Visit (INDEPENDENT_AMBULATORY_CARE_PROVIDER_SITE_OTHER): Payer: Medicare Other | Admitting: Internal Medicine

## 2016-02-28 VITALS — BP 122/82 | HR 69 | Ht 65.0 in | Wt 183.8 lb

## 2016-02-28 DIAGNOSIS — R0789 Other chest pain: Secondary | ICD-10-CM | POA: Diagnosis not present

## 2016-02-28 NOTE — Patient Instructions (Signed)
Your physician recommends that you continue on your current medications as directed. Please refer to the Current Medication list given to you today.  Your physician has requested that you have en exercise stress myoview. For further information please visit [email or web address]. Please follow instruction sheet, as given.  Follow up with your physician will depend on test results.

## 2016-02-28 NOTE — Progress Notes (Signed)
Cardiology Office Note   Date:  02/28/2016   ID:  Barbara [name], DOB March 26, 1950, MRN [id number]  PCP:  Barbara Coma, MD  Cardiologist:   Dorris Carnes, MD   [name]    History of Present Illness: Barbara [name] is a 65 y.o. female with a history of HTN, HL, GERD  REcently admitted to [facility] with CP  Pain at rest  Epigastric  To L jaw  Mild palpitation  Lasted a coupole mein   REflux takes Tums regularly   Trop negative  CXR with thoracic atherosclerosis  Echo:  LVEF normal  Gr II diastolic dysfunction    Patient says tightness across chest to bilateral neck and Jaw Happened again lst night while sitting an watching TV  Lasted 5 min  One on day to ER lasted 10 ming  Pt used to work out at the [employer] recently because of eye problems  NOw notices more SOB with actvity     Current Meds  Medication Sig  . acetaminophen (TYLENOL) 500 MG tablet Take 1,000 mg by mouth every 6 (six) hours as needed for mild pain or moderate pain.   Marland Kitchen albuterol (PROVENTIL HFA;VENTOLIN HFA) 108 (90 Base) MCG/ACT inhaler Inhale 2 puffs into the lungs every 6 (six) hours as needed for wheezing or shortness of breath.  . Ascorbic Acid (VITAMIN C) 1000 MG tablet Take 1,000 mg by mouth daily.  Marland Kitchen aspirin EC 81 MG tablet Take 81 mg by mouth at bedtime.   . beta carotene w/minerals (OCUVITE) tablet Take 1 tablet by mouth daily.  Marland Kitchen CALCIUM-VITAMIN D PO Take 1,200 mg by mouth daily.  . Cholecalciferol (VITAMIN D-3) 1000 UNITS CAPS Take 1 capsule by mouth daily.  . cyanocobalamin 1000 MCG tablet Take 5,000 mcg by mouth daily.  Marland Kitchen guaiFENesin (MUCINEX) 600 MG 12 hr tablet Take 600 mg by mouth daily as needed for congestion.  Marland Kitchen levocetirizine (XYZAL) 5 MG tablet Take 5 mg by mouth every evening.  . loratadine (CLARITIN) 10 MG tablet Take 10 mg by mouth daily as needed for allergies.  . milk thistle 175 MG tablet Take 175 mg by mouth 2 (two) times daily.  Marland Kitchen omeprazole (PRILOSEC) 20 MG capsule Take  1 capsule (20 mg total) by mouth 2 (two) times daily before a meal.  . prednisoLONE acetate (PRED FORTE) 1 % ophthalmic suspension Place 1 drop into both eyes 4 (four) times daily.  . propranolol (INDERAL) 10 MG tablet Take 10 mg by mouth 3 (three) times daily as needed (tremors). Pt takes Twice a day daily, and if needed for tremors may take an extra dose.  . psyllium (METAMUCIL SMOOTH TEXTURE) 28 % packet Take 1 packet by mouth 2 (two) times daily.   Marland Kitchen pyridOXINE (VITAMIN B-6) 100 MG tablet Take 100 mg by mouth daily.  . Red Yeast Rice 600 MG TABS Take 1 tablet by mouth 2 (two) times daily.     Allergies:   Ace inhibitors; Amoxicillin; Celebrex [celecoxib]; Enalapril; Fentanyl; Fish oil; Other; Penicillins; Propoxyphene hcl; Sulfonamide derivatives; and Synvisc [hylan g-f 20]   Past Medical History:  Diagnosis Date  . Allergy   . Anxiety   . Arthritis    knees  . Cancer ([place])    basal cell- nose  . GERD (gastroesophageal reflux disease)   . History of skin cancer   . Hyperlipidemia   . Hypertension   . Knee pain, left   . Neuromuscular disorder ([place])  benign tremor- takes Propanolol  . Occasional tremors   . PONV (postoperative nausea and vomiting)    "BP DROPS ALSO"  . Trigeminal neuralgia of right side of face     Past Surgical History:  Procedure Laterality Date  . fractured femur  05/2012   left - rod -screws placed  . HARDWARE REMOVAL Left 04/16/2013   Procedure: REMOVAL OF HARDWARE OF LEFT KNEE (DISTAL INTERLOC SCREW);  Surgeon: Gearlean Alf, MD;  Location: [facility];  Service: Orthopedics;  Laterality: Left;  . KNEE SURGERY     x 3 left / x1 rt knee  . NASAL SINUS SURGERY     2000  . SHOULDER SURGERY     bilateral shoulders   . WISDOM TOOTH EXTRACTION       Social History:  The patient  reports that she has never smoked. She has never used smokeless tobacco. She reports that she drinks alcohol. She reports that she does not use drugs.   Family History:  The  patient's family history includes Stomach cancer in her mother.    ROS:  Please see the history of present illness. All other systems are reviewed and  Negative to the above problem except as noted.    PHYSICAL EXAM: VS:  BP 122/82   Pulse 69   Ht '5\' 5"'  (1.651 m)   Wt 183 lb 12.8 oz (83.4 kg)   SpO2 97%   BMI 30.59 kg/m   GEN: Well nourished, well developed, in no acute distress  HEENT: normal  Neck: no JVD, carotid bruits, or masses Cardiac: RRR; no murmurs, rubs, or gallops,no edema  Respiratory:  clear to auscultation bilaterally, normal work of breathing GI: soft, nontender, nondistended, + BS  No hepatomegaly  MS: no deformity Moving all extremities   Skin: warm and dry, no rash Neuro:  Strength and sensation are intact Psych: euthymic mood, full affect   EKG:  EKG is not ordered today.   Lipid Panel    Component Value Date/Time   CHOL 169 02/25/2016 0323   TRIG 151 (H) 02/25/2016 0323   HDL 39 (L) 02/25/2016 0323   CHOLHDL 4.3 02/25/2016 0323   VLDL 30 02/25/2016 0323   LDLCALC 100 (H) 02/25/2016 0323      Wt Readings from Last 3 Encounters:  02/28/16 183 lb 12.8 oz (83.4 kg)  02/24/16 183 lb 12.8 oz (83.4 kg)  09/24/13 167 lb (75.8 kg)      ASSESSMENT AND PLAN:  1  Chest pressure  / SOB  Concerning but not completely typical for angina  ? GI  She does have Gr II diastolic dysfunction  Volume though is OK on exam  Will set up for stress myovue  Look at BP, exercise capacity and perfusion   Continue omeprazole    F?U based on test results     Current medicines are reviewed at length with the patient today.  The patient does not have concerns regarding medicines.  Signed, Dorris Carnes, MD  02/28/2016 11:13 AM    [address], [place], [place]  [address] Phone: [phone]; Fax: [phone]

## 2016-02-29 ENCOUNTER — Telehealth (HOSPITAL_COMMUNITY): Payer: Self-pay | Admitting: *Deleted

## 2016-02-29 NOTE — Telephone Encounter (Signed)
Patient given detailed instructions per Myocardial Perfusion Study Information Sheet for the test on 03/02/16. Patient notified to arrive 15 minutes early and that it is imperative to arrive on time for appointment to keep from having the test rescheduled.  If you need to cancel or reschedule your appointment, please call the office within 24 hours of your appointment. Failure to do so may result in a cancellation of your appointment, and a $50 no show fee. Patient verbalized understanding. Barbara [name]    

## 2016-03-02 ENCOUNTER — Ambulatory Visit (HOSPITAL_COMMUNITY): Payer: Medicare Other | Attending: Internal Medicine

## 2016-03-02 DIAGNOSIS — R0789 Other chest pain: Secondary | ICD-10-CM | POA: Diagnosis present

## 2016-03-02 LAB — MYOCARDIAL PERFUSION IMAGING
LV dias vol: 82 mL (ref 46–106)
LV sys vol: 30 mL
Peak HR: 114 {beats}/min
RATE: 0.26
Rest HR: 79 {beats}/min
SDS: 0
SRS: 1
SSS: 1
TID: 1

## 2016-03-02 MED ORDER — REGADENOSON 0.4 MG/5ML IV SOLN
0.4000 mg | Freq: Once | INTRAVENOUS | Status: AC
  Administered 2016-03-02: 0.4 mg via INTRAVENOUS

## 2016-03-02 MED ORDER — TECHNETIUM TC 99M TETROFOSMIN IV KIT
32.0000 | PACK | Freq: Once | INTRAVENOUS | Status: AC | PRN
  Administered 2016-03-02: 32 via INTRAVENOUS
  Filled 2016-03-02: qty 32

## 2016-03-02 MED ORDER — TECHNETIUM TC 99M TETROFOSMIN IV KIT
10.1000 | PACK | Freq: Once | INTRAVENOUS | Status: AC | PRN
  Administered 2016-03-02: 10.1 via INTRAVENOUS
  Filled 2016-03-02: qty 11

## 2016-03-08 ENCOUNTER — Telehealth: Payer: Self-pay | Admitting: Internal Medicine

## 2016-03-08 NOTE — Telephone Encounter (Signed)
Patient made aware of her stress test results. Stress test showed normal blood flow to the heart and that her pumping function was normal. Patient made aware that Dr. Harrington Challenger does not think that her symptoms are due to a blood supply problem. Patient was advised to monitor her blood pressure closely since it was high. Patient made aware that her results were sent to Dr. Stephanie Acre, and that she should follow up with her. Patient verbalizes understanding and is in agreement with plan.

## 2016-03-08 NOTE — Telephone Encounter (Signed)
Mrs. Barczak is returning call. Please call cell # first and then contact on home # [phone]. Thanks.

## 2016-03-14 ENCOUNTER — Telehealth: Payer: Self-pay | Admitting: Internal Medicine

## 2016-03-14 NOTE — Telephone Encounter (Signed)
Patient has had a chest pressure and pain bothering her for almost a month. She has been seen in the ER and has had a cardiac work up that was negative. She was started on Omeprazole 20 mg BID. She no longer has any burning but she admits to continued reflux. She is aware of the sensation of something being stuck at all times. She has spells of pain in this area that will awaken her from sleep. She admits to a point of tenderness at the base of her sternum. Appointment made for tomorrow due to her discomfort with these symptoms. Appointment for 03/23/16 is canceled.

## 2016-03-15 ENCOUNTER — Ambulatory Visit (INDEPENDENT_AMBULATORY_CARE_PROVIDER_SITE_OTHER): Payer: Medicare Other | Admitting: Gastroenterology

## 2016-03-15 ENCOUNTER — Encounter: Payer: Self-pay | Admitting: Gastroenterology

## 2016-03-15 VITALS — BP 130/86 | HR 72 | Ht 65.0 in | Wt 185.4 lb

## 2016-03-15 DIAGNOSIS — K219 Gastro-esophageal reflux disease without esophagitis: Secondary | ICD-10-CM

## 2016-03-15 DIAGNOSIS — R490 Dysphonia: Secondary | ICD-10-CM | POA: Diagnosis not present

## 2016-03-15 DIAGNOSIS — R0789 Other chest pain: Secondary | ICD-10-CM | POA: Diagnosis not present

## 2016-03-15 DIAGNOSIS — F458 Other somatoform disorders: Secondary | ICD-10-CM | POA: Diagnosis not present

## 2016-03-15 DIAGNOSIS — R0989 Other specified symptoms and signs involving the circulatory and respiratory systems: Secondary | ICD-10-CM

## 2016-03-15 MED ORDER — OMEPRAZOLE 40 MG PO CPDR: 40.0000 mg | DELAYED_RELEASE_CAPSULE | Freq: Two times a day (BID) | ORAL | 3 refills | Status: DC

## 2016-03-15 NOTE — Progress Notes (Signed)
03/15/2016 Barbara [name] [id number] 1950/05/08   HISTORY OF PRESENT ILLNESS:  This is a 66 year old female who was previously known to Dr. Olevia Perches.  Her last colonoscopy was 09/2013 at which time she just had small internal hemorrhoids.  Her care will be assumed by Dr. Havery Moros in Dr. Nichola Sizer absence.  She presents to our office today with complaints of hoarseness, sensation that there is something in her throat, chest pain, sometimes sensation that pills and food get stuck transiently, and reflux.  Says that she is constantly clearing her throat.  Says that she feels like she has a lot of congestion and mucus.  This has been going on for years.  Has seen ENT and had nasolaryngoscopy with no significant findings.  Takes Xyzal and claritin daily along with Mucinex twice a day with no relief.  Was evaluated for chest pain recently with negative ECHO and stress test; chest pain deemed non-cardiac.  Reports a lot of reflux.  Is currently on omeprazole 20 mg BID and does not notice a difference in symptoms at this point.  Never had an EGD.    Past Medical History:  Diagnosis Date  . Allergy   . Anxiety   . Arthritis    knees  . Cancer ([place])    basal cell- nose  . GERD (gastroesophageal reflux disease)   . History of skin cancer   . Hyperlipidemia   . Hypertension   . Knee pain, left   . Neuromuscular disorder ([place])    benign tremor- takes Propanolol  . Occasional tremors   . PONV (postoperative nausea and vomiting)    "BP DROPS ALSO"  . Trigeminal neuralgia of right side of face    Past Surgical History:  Procedure Laterality Date  . fractured femur  05/2012   left - rod -screws placed  . HARDWARE REMOVAL Left 04/16/2013   Procedure: REMOVAL OF HARDWARE OF LEFT KNEE (DISTAL INTERLOC SCREW);  Surgeon: Gearlean Alf, MD;  Location: [facility];  Service: Orthopedics;  Laterality: Left;  . KNEE SURGERY     x 3 left / x1 rt knee  . NASAL SINUS SURGERY     2000  . SHOULDER SURGERY     bilateral shoulders   . WISDOM TOOTH EXTRACTION      reports that she has never smoked. She has never used smokeless tobacco. She reports that she drinks alcohol. She reports that she does not use drugs. family history includes Lung cancer in her father; Stomach cancer in her mother. Allergies  Allergen Reactions  . Ace Inhibitors     Pt unsure- nausea or hives  . Amoxicillin Hives  . Celebrex [Celecoxib] Other (See Comments)    Severe stomach cramps  . Enalapril Other (See Comments)    congestion  . Fentanyl Nausea And Vomiting  . Fish Oil Other (See Comments)    Broke out with blisters  . Other Nausea And Vomiting    Narcotic Pain Killers- vomitting, nausea constantly  . Penicillins Hives    Has patient had a PCN reaction causing immediate rash, facial/tongue/throat swelling, SOB or lightheadedness with hypotension: unknown Has patient had a PCN reaction causing severe rash involving mucus membranes or skin necrosis: no Has patient had a PCN reaction that required hospitalization - no, was at MD office Has patient had a PCN reaction occurring within the last 10 years: no If all of the above answers are "NO", then may proceed with Cephalosporin use.   Marland Kitchen  Propoxyphene Hcl Nausea Only  . Sulfonamide Derivatives Nausea Only and Other (See Comments)    Stomach cramping  . Synvisc [Hylan G-F 20]     Made knee swell up at injection site      Outpatient Encounter Prescriptions as of 03/15/2016  Medication Sig  . acetaminophen (TYLENOL) 500 MG tablet Take 1,000 mg by mouth every 6 (six) hours as needed for mild pain or moderate pain.   Marland Kitchen albuterol (PROVENTIL HFA;VENTOLIN HFA) 108 (90 Base) MCG/ACT inhaler Inhale 2 puffs into the lungs every 6 (six) hours as needed for wheezing or shortness of breath.  . Ascorbic Acid (VITAMIN C) 1000 MG tablet Take 1,000 mg by mouth daily.  Marland Kitchen aspirin EC 81 MG tablet Take 81 mg by mouth at bedtime.   . beta carotene w/minerals (OCUVITE) tablet Take 1  tablet by mouth daily.  Marland Kitchen CALCIUM-VITAMIN D PO Take 1,200 mg by mouth daily.  . Cholecalciferol (VITAMIN D-3) 1000 UNITS CAPS Take 1 capsule by mouth daily.  . cyanocobalamin 1000 MCG tablet Take 5,000 mcg by mouth daily.  Marland Kitchen guaiFENesin (MUCINEX) 600 MG 12 hr tablet Take 600 mg by mouth daily as needed for congestion.  Marland Kitchen levocetirizine (XYZAL) 5 MG tablet Take 5 mg by mouth every evening.  . loratadine (CLARITIN) 10 MG tablet Take 10 mg by mouth daily as needed for allergies.  . milk thistle 175 MG tablet Take 175 mg by mouth 2 (two) times daily.  . nitrofurantoin, macrocrystal-monohydrate, (MACROBID) 100 MG capsule TAKE ONE CAPSULE BY MOUTH TWICE A DAY WITH FOOD  . omeprazole (PRILOSEC) 20 MG capsule Take 1 capsule (20 mg total) by mouth 2 (two) times daily before a meal.  . prednisoLONE acetate (PRED FORTE) 1 % ophthalmic suspension Place 1 drop into both eyes 4 (four) times daily.  . propranolol (INDERAL) 10 MG tablet Take 10 mg by mouth 3 (three) times daily as needed (tremors). Pt takes Twice a day daily, and if needed for tremors may take an extra dose.  . psyllium (METAMUCIL SMOOTH TEXTURE) 28 % packet Take 1 packet by mouth 2 (two) times daily.   Marland Kitchen pyridOXINE (VITAMIN B-6) 100 MG tablet Take 100 mg by mouth daily.  . Red Yeast Rice 600 MG TABS Take 1 tablet by mouth 2 (two) times daily.  . [DISCONTINUED] fluorometholone (FML) 0.1 % ophthalmic suspension 1 drop into affected eye   No facility-administered encounter medications on file as of 03/15/2016.      REVIEW OF SYSTEMS  : All other systems reviewed and negative except where noted in the History of Present Illness.   PHYSICAL EXAM: BP 130/86   Pulse 72   Ht 5\' 5"  (1.651 m)   Wt 185 lb 6 oz (84.1 kg)   BMI 30.85 kg/m  General: Well developed white female in no acute distress Head: Normocephalic and atraumatic Eyes:  Sclerae anicteric, conjunctiva pink. Ears: Normal auditory acuity Lungs: Clear throughout to auscultation.   No increased WOB. Heart: Regular rate and rhythm Abdomen: Soft, non-distended.  BS present.  Non-tender. Musculoskeletal: Symmetrical with no gross deformities  Skin: No lesions on visible extremities Extremities: No edema  Neurological: Alert oriented x 4, grossly non-focal Psychological:  Alert and cooperative. Normal mood and affect  ASSESSMENT AND PLAN: -66 year old female with complaints of hoarseness, globus sensation, chest pain that has been deemed non-cardiac, some dysphagia and reflux:  Reports negative ENT evaluation and no improvement with multiple allergy regimens.  Will start by increasing her  omeprazole to 40 mg BID for now and schedule for EGD with Dr. Havery Moros to evaluate for reflux, stricture, etc.  The risks, benefits, and alternatives to EGD with possible dilation were discussed with the patient and she consents to proceed.   CC:  Jonathon Jordan, MD

## 2016-03-15 NOTE — Patient Instructions (Addendum)
You have been scheduled for an endoscopy. Please follow written instructions given to you at your visit today. If you use inhalers (even only as needed), please bring them with you on the day of your procedure. Your physician has requested that you go to www.startemmi.com and enter the access code given to you at your visit today. This web site gives a general overview about your procedure. However, you should still follow specific instructions given to you by our office regarding your preparation for the procedure.  We have sent the following medications to your pharmacy for you to pick up at your convenience: Omeprazole 40 mg twice a day 

## 2016-03-17 DIAGNOSIS — R0989 Other specified symptoms and signs involving the circulatory and respiratory systems: Secondary | ICD-10-CM | POA: Insufficient documentation

## 2016-03-17 DIAGNOSIS — K219 Gastro-esophageal reflux disease without esophagitis: Secondary | ICD-10-CM | POA: Insufficient documentation

## 2016-03-17 DIAGNOSIS — R490 Dysphonia: Secondary | ICD-10-CM | POA: Insufficient documentation

## 2016-03-20 NOTE — Progress Notes (Signed)
Agree with assessment and plan as outlined.  

## 2016-03-23 ENCOUNTER — Ambulatory Visit: Payer: Medicare Other | Admitting: Nurse Practitioner

## 2016-03-24 ENCOUNTER — Ambulatory Visit (AMBULATORY_SURGERY_CENTER): Payer: Medicare Other | Admitting: Gastroenterology

## 2016-03-24 ENCOUNTER — Encounter: Payer: Self-pay | Admitting: Gastroenterology

## 2016-03-24 VITALS — BP 131/77 | HR 62 | Temp 98.0°F | Resp 16 | Ht 65.0 in | Wt 185.0 lb

## 2016-03-24 DIAGNOSIS — R490 Dysphonia: Secondary | ICD-10-CM | POA: Diagnosis present

## 2016-03-24 DIAGNOSIS — K219 Gastro-esophageal reflux disease without esophagitis: Secondary | ICD-10-CM | POA: Diagnosis not present

## 2016-03-24 DIAGNOSIS — F458 Other somatoform disorders: Secondary | ICD-10-CM | POA: Diagnosis not present

## 2016-03-24 DIAGNOSIS — R1013 Epigastric pain: Secondary | ICD-10-CM | POA: Diagnosis not present

## 2016-03-24 DIAGNOSIS — R0989 Other specified symptoms and signs involving the circulatory and respiratory systems: Secondary | ICD-10-CM

## 2016-03-24 DIAGNOSIS — K2950 Unspecified chronic gastritis without bleeding: Secondary | ICD-10-CM | POA: Diagnosis not present

## 2016-03-24 MED ORDER — SODIUM CHLORIDE 0.9 % IV SOLN: 500.0000 mL | INTRAVENOUS | Status: DC

## 2016-03-24 NOTE — Progress Notes (Signed)
Called to room to assist during endoscopic procedure.  Patient ID and intended procedure confirmed with present staff. Received instructions for my participation in the procedure from the performing physician.  

## 2016-03-24 NOTE — Patient Instructions (Signed)
Impressions/recommendations:  Normal exam. Await pathology results.  YOU HAD AN ENDOSCOPIC PROCEDURE TODAY AT [facility]:   Refer to the procedure report that was given to you for any specific questions about what was found during the examination.  If the procedure report does not answer your questions, please call your gastroenterologist to clarify.  If you requested that your care partner not be given the details of your procedure findings, then the procedure report has been included in a sealed envelope for you to review at your convenience later.  YOU SHOULD EXPECT: Some feelings of bloating in the abdomen. Passage of more gas than usual.  Walking can help get rid of the air that was put into your GI tract during the procedure and reduce the bloating. If you had a lower endoscopy (such as a colonoscopy or flexible sigmoidoscopy) you may notice spotting of blood in your stool or on the toilet paper. If you underwent a bowel prep for your procedure, you may not have a normal bowel movement for a few days.  Please Note:  You might notice some irritation and congestion in your nose or some drainage.  This is from the oxygen used during your procedure.  There is no need for concern and it should clear up in a day or so.  SYMPTOMS TO REPORT IMMEDIATELY:   Following upper endoscopy (EGD)  Vomiting of blood or coffee ground material  New chest pain or pain under the shoulder blades  Painful or persistently difficult swallowing  New shortness of breath  Fever of 100F or higher  Black, tarry-looking stools  For urgent or emergent issues, a gastroenterologist can be reached at any hour by calling [phone].   DIET:  We do recommend a small meal at first, but then you may proceed to your regular diet.  Drink plenty of fluids but you should avoid alcoholic beverages for 24 hours.  ACTIVITY:  You should plan to take it easy for the rest of today and you should NOT DRIVE or  use heavy machinery until tomorrow (because of the sedation medicines used during the test).    FOLLOW UP: Our staff will call the number listed on your records the next business day following your procedure to check on you and address any questions or concerns that you may have regarding the information given to you following your procedure. If we do not reach you, we will leave a message.  However, if you are feeling well and you are not experiencing any problems, there is no need to return our call.  We will assume that you have returned to your regular daily activities without incident.  If any biopsies were taken you will be contacted by phone or by letter within the next 1-3 weeks.  Please call us at [phone] if you have not heard about the biopsies in 3 weeks.    SIGNATURES/CONFIDENTIALITY: You and/or your care partner have signed paperwork which will be entered into your electronic medical record.  These signatures attest to the fact that that the information above on your After Visit Summary has been reviewed and is understood.  Full responsibility of the confidentiality of this discharge information lies with you and/or your care-partner.

## 2016-03-24 NOTE — Op Note (Signed)
[facility] Patient Name: Barbara [name] Procedure Date: 03/24/2016 8:44 AM MRN: [id number] Endoscopist: Remo Lipps P. Filemon Breton MD, MD Age: 66 Referring MD:  Date of Birth: 1950-10-20 Gender: Female Account #: [id number] Procedure:                Upper GI endoscopy Indications:              Heartburn, Globus sensation, hoarseness, epigastric                            pain - somewhat improved on high dose PPI Medicines:                Monitored Anesthesia Care Procedure:                Pre-Anesthesia Assessment:                           - Prior to the procedure, a History and Physical                            was performed, and patient medications and                            allergies were reviewed. The patient's tolerance of                            previous anesthesia was also reviewed. The risks                            and benefits of the procedure and the sedation                            options and risks were discussed with the patient.                            All questions were answered, and informed consent                            was obtained. Prior Anticoagulants: The patient has                            taken no previous anticoagulant or antiplatelet                            agents. ASA Grade Assessment: II - A patient with                            mild systemic disease. After reviewing the risks                            and benefits, the patient was deemed in                            satisfactory condition to undergo the procedure.  After obtaining informed consent, the endoscope was                            passed under direct vision. Throughout the                            procedure, the patient's blood pressure, pulse, and                            oxygen saturations were monitored continuously. The                            Model GIF-HQ190 [id number]) scope was introduced   through the mouth, and advanced to the second part                            of duodenum. The upper GI endoscopy was                            accomplished without difficulty. The patient                            tolerated the procedure well. Scope In: Scope Out: Findings:                 Esophagogastric landmarks were identified: the                            Z-line was found at 36 cm, the gastroesophageal                            junction was found at 36 cm and the upper extent of                            the gastric folds was found at 36 cm from the                            incisors.                           The exam of the esophagus was otherwise normal. No                            inflammatory changes noted.                           Biopsies were taken with a cold forceps in the                            upper third of the esophagus, in the middle third                            of the esophagus and in the lower third of the  esophagus for histology.                           The entire examined stomach was normal. Biopsies                            were taken with a cold forceps for Helicobacter                            pylori testing.                           The duodenal bulb and second portion of the                            duodenum were normal. Complications:            No immediate complications. Estimated blood loss:                            Minimal. Estimated Blood Loss:     Estimated blood loss was minimal. Impression:               - Esophagogastric landmarks identified.                           - Normal esophagus - biopsied to rule out                            eosinophilic esophagitis                           - Normal stomach. Biopsied to rule out H pylori.                           - Normal duodenal bulb and second portion of the                            duodenum.                           Overall, no pathology  noted on this exam to account                            for the patient's symptoms - etiology includes                            non-erosive reflux disease ([place]) or motility                            disorder. Recommendation:           - Patient has a contact number available for                            emergencies. The signs and symptoms of potential  delayed complications were discussed with the                            patient. Return to normal activities tomorrow.                            Written discharge instructions were provided to the                            patient.                           - Resume previous diet.                           - Continue present medications.                           - Await pathology results.                           - If symptoms persist despite high dose nexium,                            recommend esophageal manometry with 24 HR pH                            Impedance testing to rule out motility disorder and                            further assess type and degree of reflux [name]. [place] MD, MD 03/24/2016 9:01:34 AM This report has been signed electronically.

## 2016-03-24 NOTE — Progress Notes (Signed)
Patient awakening,vss,report to rn 

## 2016-03-27 ENCOUNTER — Telehealth: Payer: Self-pay

## 2016-03-27 NOTE — Telephone Encounter (Signed)
  Follow up Call-  Call back number 03/24/2016 09/24/2013  Post procedure Call Back phone  # [phone]  Permission to leave phone message Yes Yes  Some recent data might be hidden     Patient questions:  Do you have a fever, pain , or abdominal swelling? No. Pain Score  0 *  Have you tolerated food without any problems? Yes.    Have you been able to return to your normal activities? Yes.    Do you have any questions about your discharge instructions: Diet   No. Medications  No. Follow up visit  No.  Do you have questions or concerns about your Care? No.  Actions: * If pain score is 4 or above: No action needed, pain <4.

## 2016-04-03 ENCOUNTER — Telehealth: Payer: Self-pay

## 2016-04-03 ENCOUNTER — Other Ambulatory Visit: Payer: Self-pay

## 2016-04-03 DIAGNOSIS — K219 Gastro-esophageal reflux disease without esophagitis: Secondary | ICD-10-CM

## 2016-04-03 NOTE — Telephone Encounter (Signed)
Patient did call back and wishes to proceed with the esophageal manometry and 24 HR pH impedence testing. She is scheduled for 04/17/16 at [facility] arrive at 8:00 am. Patient mailed prep instructions. Patient will continue taking her omeprazole BID while doing this test according to path result note.

## 2016-04-03 NOTE — Progress Notes (Signed)
Prep mailed

## 2016-04-03 NOTE — Progress Notes (Signed)
Mailed prep, Dr. Havery Moros wants her to continue on omeprazole bid while doing this test.

## 2016-04-04 ENCOUNTER — Encounter: Payer: Medicare Other | Admitting: Gastroenterology

## 2016-04-17 ENCOUNTER — Encounter (HOSPITAL_COMMUNITY)
Admission: RE | Disposition: A | Discharge status: Home / Self Care | Payer: Self-pay | Source: Ambulatory Visit | Attending: Gastroenterology

## 2016-04-17 ENCOUNTER — Ambulatory Visit (HOSPITAL_COMMUNITY)
Admission: RE | Admit: 2016-04-17 | Discharge: 2016-04-17 | Disposition: A | Discharge status: Home / Self Care | Payer: Medicare Other | Source: Ambulatory Visit | Attending: Gastroenterology | Admitting: Gastroenterology

## 2016-04-17 DIAGNOSIS — F458 Other somatoform disorders: Secondary | ICD-10-CM | POA: Diagnosis not present

## 2016-04-17 DIAGNOSIS — K219 Gastro-esophageal reflux disease without esophagitis: Secondary | ICD-10-CM

## 2016-04-17 DIAGNOSIS — R490 Dysphonia: Secondary | ICD-10-CM | POA: Diagnosis not present

## 2016-04-17 HISTORY — PX: 24 HOUR PH STUDY: SHX5419

## 2016-04-17 HISTORY — PX: ESOPHAGEAL MANOMETRY: SHX5429

## 2016-04-17 SURGERY — MANOMETRY, ESOPHAGUS

## 2016-04-17 MED ORDER — LIDOCAINE VISCOUS 2 % MT SOLN
OROMUCOSAL | Status: AC
  Filled 2016-04-17: qty 15

## 2016-04-17 SURGICAL SUPPLY — 2 items
FACESHIELD LNG OPTICON STERILE (SAFETY) IMPLANT
GLOVE BIO SURGEON STRL SZ8 (GLOVE) ×6 IMPLANT

## 2016-04-17 NOTE — Progress Notes (Signed)
Esophageal Manometry done per protocol. Pt tolerated well without complications. 24 hour PH with impedance probe inserted per protocol without complication. Pt tolerated well. Study explained to pt using teach back. Pt voiced understanding. Questions answered per pt satisfaction. Pt to return to have probe removed 04/18/2016 at 0930.

## 2016-04-18 ENCOUNTER — Encounter (HOSPITAL_COMMUNITY): Payer: Self-pay | Admitting: Gastroenterology

## 2016-04-27 ENCOUNTER — Telehealth: Payer: Self-pay | Admitting: Gastroenterology

## 2016-04-27 NOTE — Telephone Encounter (Signed)
I called the patient back in relay the results as outlined below. She reports her reflux and globus has improved over time however her hoarseness in her voice persists. She also now complains of shortness of breath and previously had a negative cardiac evaluation. She states she can be referred to pulmonary which I think is reasonable for her shortness of breath.  Based on her pH score her voice hoarseness does not seem to be related to reflux at this time.  Recommend moving forward that she use the lowest dose of daily PPI needed to control her symptoms. When fairly well she will try going down to omeprazole 40 mg once daily at this time, however she notes a rebound in symptoms she can increase back to twice a day. If over time she does well with 40 mg once per day she can even go down to 20 g once a day, if she needs this prescription she can contact me for. She can follow-up as needed otherwise.

## 2016-04-27 NOTE — Telephone Encounter (Signed)
Patient underwent manometry and pH testing with results as follows (formal report to be scanned into Epic).   Manometry - normal study, no dysmotility PH study on PPI - Demeester score of 1, good control of reflux on PPI. One symptom reported, and did not correlate to reflux   Overall, reflux well controlled on her regimen. If symptoms persist, they either represent functional heartburn or non esophageal related etiology.   Patient will be contacted with results. May consider trial of TCA

## 2016-05-12 ENCOUNTER — Encounter: Payer: Self-pay | Admitting: Internal Medicine

## 2016-05-12 ENCOUNTER — Ambulatory Visit (INDEPENDENT_AMBULATORY_CARE_PROVIDER_SITE_OTHER): Payer: Medicare Other | Admitting: Internal Medicine

## 2016-05-12 ENCOUNTER — Telehealth: Payer: Self-pay | Admitting: Internal Medicine

## 2016-05-12 VITALS — BP 124/68 | HR 79 | Ht 63.0 in | Wt 187.0 lb

## 2016-05-12 DIAGNOSIS — R0609 Other forms of dyspnea: Secondary | ICD-10-CM

## 2016-05-12 DIAGNOSIS — J45991 Cough variant asthma: Secondary | ICD-10-CM

## 2016-05-12 LAB — NITRIC OXIDE: Nitric Oxide: 40

## 2016-05-12 MED ORDER — PREDNISONE 10 MG PO TABS: ORAL_TABLET | ORAL | 0 refills | Status: DC

## 2016-05-12 NOTE — Patient Instructions (Addendum)
Stop the proair respiclick  Please see patient coordinator before you leave today  to schedule sinus CT   Prednisone 10 mg take  4 each am x 2 days,   2 each am x 2 days,  1 each am x 2 days and stop   dymista one twice daily each nostril   For drainage / throat tickle try take CHLORPHENIRAMINE  4 mg - take one every 4 hours as needed - available over the counter- may cause drowsiness so start with just a bedtime dose or two and see how you tolerate it before trying in daytime    GERD (REFLUX)  is an extremely common cause of respiratory symptoms just like yours , many times with no obvious heartburn at all.    It can be treated with medication, but also with lifestyle changes including elevation of the head of your bed (ideally with 6 inch  bed blocks),  Smoking cessation, avoidance of late meals, excessive alcohol, and avoid fatty foods, chocolate, peppermint, colas, red wine, and acidic juices such as orange juice.  NO MINT OR MENTHOL PRODUCTS SO NO COUGH DROPS   USE SUGARLESS CANDY INSTEAD (Jolley ranchers or Stover's or Life Savers) or even ice chips will also do - the key is to swallow to prevent all throat clearing. NO OIL BASED VITAMINS - use powdered substitutes.  Please schedule a follow up office visit in 2  weeks, sooner if needed  with all medications /inhalers/ solutions in hand so we can verify exactly what you are taking. This includes all medications from all doctors and over the counters - place your as neededs in a separate back

## 2016-05-12 NOTE — Progress Notes (Signed)
Subjective:     Patient ID: Barbara [name], female   DOB: 05/31/1950,     MRN: [id number]  HPI  66 yowf never smoker but some passive exp remembers as "long as she can remember"  itchy eye runny nose mostly in spring persisted thru childhood got worse age 66 more of a year round  eval by allergy > everything > allergy shots x 2 year helped to point where only needed antihistimes then in late 30's living in [place] same symptoms > [name] same allergies > shots helped x 2 years> same benefit still needed antihistamines springtime > then sinus surgery early 50s > made a lot of difference but still needed antihistamines in spring time then despite living same house x 30 years noted new sensation of something stuck in throat around age 66 around 2015 not just in spring while on ACEi = enalpril > persisted and ENT eval x 2 = yeast infection > no change in symptoms > remained on propranolol because it also helped her tremors and new sob x 2017 referred to pulmonary clinic 05/12/2016 by Dr   Alessandra Grout    05/12/2016 1st [facility] office visit/ Adaliz Dobis   Chief Complaint  Patient presents with  . Pulmonary Consult    Referred by Dr. Jonathon Jordan. Pt c/o SOB for the past few months. She states she gets SOB with walking up stairs and sometimes with just walking from room to room. She also runs out of breath sometimes when talking alot. She states for the past year she has had congestion in her throat and frequent throat clearing.    new sensation of globus and sob if talks but ok if sitting /sleeping with assoc HB but highly variable, sometimes sob at rest and sometimes room to room.  Sleeps ok but First thing  In am when it's time to get up voice gone but gets some better after clears her throat but s excess/ purulent sputum or mucus plugs  - no better on proair respiclick/clariton /zyrtec (takes both)  And ppi   No obvious day to day or daytime variability or assoc   cp or chest tightness, subjective wheeze  or overt sinus   symptoms. No unusual exp hx or h/o childhood pna/ asthma or knowledge of premature birth.  Sleeping ok without nocturnal   exacerbation  of respiratory  c/o's or need for noct saba. Also denies any obvious fluctuation of symptoms with weather or environmental changes or other aggravating or alleviating factors except as outlined above   Current Medications, Allergies, Complete Past Medical History, Past Surgical History, Family History, and Social History were reviewed in [employer] record.  ROS  The following are not active complaints unless bolded sore throat, dysphagia, dental problems, itching, sneezing,  nasal congestion or excess/ purulent secretions, ear ache,   fever, chills, sweats, unintended wt loss, classically pleuritic or exertional cp,  orthopnea pnd or leg swelling, presyncope, palpitations, abdominal pain, anorexia, nausea, vomiting, diarrhea  or change in bowel or bladder habits, change in stools or urine, dysuria,hematuria,  rash, arthralgias, visual complaints, headache, numbness, weakness or ataxia or problems with walking or coordination,  change in mood/affect or memory.           Review of Systems     Objective:   Physical Exam    very talkative amb wm with classic voice fatigue/ freq throat clearing   Wt Readings from Last 3 Encounters:  05/12/16 187 lb (84.8 kg)  03/24/16 185 lb (83.9 kg)  03/15/16 185 lb 6 oz (84.1 kg)    Vital signs reviewed - Note on arrival 02 sats  95 % on RA     HEENT: nl dentition, and oropharynx which is pristine . Nl external ear canals without cough reflex moderate bilateral non-specific turbinate edema    NECK :  without JVD/Nodes/TM/ nl carotid upstrokes bilaterally   LUNGS: no acc muscle use,  Nl contour chest which is clear to A and P bilaterally without cough on insp or exp maneuvers   CV:  RRR  no s3 or murmur or increase in P2, and no edema   ABD:  soft and nontender with nl  inspiratory excursion in the supine position. No bruits or organomegaly appreciated, bowel sounds nl  MS:  Nl gait/ ext warm without deformities, calf tenderness, cyanosis or clubbing No obvious joint restrictions   SKIN: warm and dry without lesions    NEURO:  alert, approp, nl sensorium with  no motor or cerebellar deficits apparent.      I personally reviewed images and agree with radiology impression as follows:  CXR:   12/141/76 There is no active cardiopulmonary disease.  Assessment:

## 2016-05-12 NOTE — Telephone Encounter (Signed)
Spoke with patient. Pred Rx sent to pharmacy Allergy med is OTC. Nothing more needed at this time.

## 2016-05-13 ENCOUNTER — Other Ambulatory Visit: Payer: Self-pay | Admitting: Gastroenterology

## 2016-05-13 DIAGNOSIS — R0609 Other forms of dyspnea: Secondary | ICD-10-CM

## 2016-05-13 NOTE — Assessment & Plan Note (Signed)
05/12/2016  Walked RA x 3 laps @ 185 ft each stopped due to  End of study, nl pace, no sob or desat  Not able to reproduce the symptoms in office today so this is probably not propranolol related as would not cause variable sob if takes on a consistent basis

## 2016-05-13 NOTE — Assessment & Plan Note (Signed)
D/c proair respiclick [id number] and rx prednisone x 6 days - FENO 05/12/2016  =   40  - trial of dymista one bid samples only  - sinus CT 05/12/2016 >>>   DDX of  difficult airways management almost all start with A and  include Adherence, Ace Inhibitors, Acid Reflux, Active Sinus Disease, Alpha 1 Antitripsin deficiency, Anxiety masquerading as Airways dz,  ABPA,  Allergy(esp in young), Aspiration (esp in elderly), Adverse effects of meds,  Active smokers, A bunch of PE's (a small clot burden can't cause this syndrome unless there is already severe underlying pulm or vascular dz with poor reserve) plus two Bs  = Bronchiectasis and Beta blocker use..and one C= CHF  Adherence is always the initial "prime suspect" and is a multilayered concern that requires a "trust but verify" approach in every patient - starting with knowing how to use medications, especially inhalers, correctly, keeping up with refills and understanding the fundamental difference between maintenance and prns vs those medications only taken for a very short course and then stopped and not refilled.  - needs to return with all meds in hand using a trust but verify approach to confirm accurate Medication  Reconciliation The principal here is that until we are certain that the  patients are doing what we've asked, it makes no sense to ask them to do more.   ? Acid (or non-acid) GERD > always difficult to exclude as up to 75% of pts in some series report no assoc GI/ Heartburn symptoms> rec continue max (24h)  acid suppression and diet restrictions/ reviewed     ? Active sinus dz/ rhinitis with pnds > try dysmista/1st gen H1 per guidelines / stop zyrtec and clariton as not eliminating pnds /throat tickle  - sinus Ct ordered  ? Allergy / asthma suggested by hx and note pt on propranolol > try prednisone x 6 days only then regroup   ? Adverse effects of dpi > try off respiclick for now  ? BB effects > certainly a concern with even low dose  propranolol, may need to consider trial  alternative rx if symptoms remain refractory   ? Anxiety > usually at the bottom of this list of usual suspects but should be much higher on this pt's based on H and P > Follow up per Primary Care planned      Total time devoted to counseling  > 50 % of initial 60 min office visit:  review case with pt/ discussion of options/alternatives/ personally creating written customized instructions  in presence of pt  then going over those specific  Instructions directly with the pt including how to use all of the meds but in particular covering each new medication in detail and the difference between the maintenance= "automatic" meds and the prns using an action plan format for the latter (If this problem/symptom => do that organization reading Left to right).  Please see AVS from this visit for a full list of these instructions which I personally wrote for this pt and  are unique to this visit. a .

## 2016-05-22 ENCOUNTER — Ambulatory Visit (INDEPENDENT_AMBULATORY_CARE_PROVIDER_SITE_OTHER)
Admission: RE | Admit: 2016-05-22 | Discharge: 2016-05-22 | Disposition: A | Discharge status: Home / Self Care | Payer: Medicare Other | Source: Ambulatory Visit | Attending: Internal Medicine | Admitting: Internal Medicine

## 2016-05-22 DIAGNOSIS — J45991 Cough variant asthma: Secondary | ICD-10-CM | POA: Diagnosis not present

## 2016-05-23 NOTE — Progress Notes (Signed)
Spoke with pt and notified of results per Dr. [name]. Pt verbalized understanding and denied any questions. 

## 2016-05-23 NOTE — Progress Notes (Signed)
LMTCB

## 2016-06-01 ENCOUNTER — Encounter: Payer: Self-pay | Admitting: Internal Medicine

## 2016-06-01 ENCOUNTER — Ambulatory Visit (INDEPENDENT_AMBULATORY_CARE_PROVIDER_SITE_OTHER): Payer: Medicare Other | Admitting: Internal Medicine

## 2016-06-01 VITALS — BP 140/86 | HR 64 | Ht 63.0 in | Wt 186.2 lb

## 2016-06-01 DIAGNOSIS — J45991 Cough variant asthma: Secondary | ICD-10-CM | POA: Diagnosis not present

## 2016-06-01 DIAGNOSIS — R0609 Other forms of dyspnea: Secondary | ICD-10-CM | POA: Diagnosis not present

## 2016-06-01 DIAGNOSIS — E669 Obesity, unspecified: Secondary | ICD-10-CM | POA: Insufficient documentation

## 2016-06-01 LAB — NITRIC OXIDE: Nitric Oxide: 42

## 2016-06-01 MED ORDER — GABAPENTIN 100 MG PO CAPS: 100.0000 mg | ORAL_CAPSULE | Freq: Three times a day (TID) | ORAL | 2 refills | Status: DC

## 2016-06-01 MED ORDER — AZELASTINE-FLUTICASONE 137-50 MCG/ACT NA SUSP: 1.0000 | Freq: Two times a day (BID) | NASAL | 11 refills | Status: DC

## 2016-06-01 NOTE — Patient Instructions (Signed)
Gabapentin 100 mg three times a day with meals  For drainage / throat tickle try take CHLORPHENIRAMINE  4 mg - take one every 4 hours as needed - available over the counter- may cause drowsiness so start with just a bedtime dose or two and see how you tolerate it before trying in daytime    Dymista one twice daily (ok to substitute astelin and flonase if can't afford it)   Change prilosec to where you take it 40 mg Take 30-60 min before first meal of the day and pepcid 20 mg at bedtime    See Tammy NP in 4  weeks with all your medications, even over the counter meds, separated in two separate bags, the ones you take no matter what vs the ones you stop once you feel better and take only as needed when you feel you need them.   Tammy  will generate for you a new user friendly medication calendar that will put [place] all on the same page re: your medication use.     Without this process, it simply isn't possible to assure that we are providing  your outpatient care  with  the attention to detail we feel you deserve.   If we cannot assure that you're getting that kind of care,  then we cannot manage your problem effectively from this clinic.  Once you have seen Tammy and we are sure that we're all on the same page with your medication use she will arrange follow up with me.

## 2016-06-01 NOTE — Progress Notes (Signed)
Subjective:     Patient ID: Barbara [name], female   DOB: 11-06-1950    MRN: [id number]    Brief patient profile:  40 yowf never smoker but some passive exp remembers as "long as she can remember"  itchy eye runny nose mostly in spring persisted thru childhood got worse age 66 more of a year round  eval by allergy > everything > allergy shots x 2 year helped to point where only needed antihistimes then in late 30's living in [place] same symptoms > [name] same allergies > shots helped x 2 years> same benefit still needed antihistamines springtime > then sinus surgery early 57s > made a lot of difference but still needed antihistamines in spring time then despite living same house x 30 years noted new sensation of something stuck in throat around age 66 around 2015 not just in spring while on ACEi = enalpril > persisted and ENT eval x 2 = yeast infection > no change in symptoms > remained on propranolol because it also helped her tremors and new sob x 2017 referred to pulmonary clinic 05/12/2016 by Dr   Alessandra Grout   History of Present Illness  05/12/2016 1st [facility] office visit/ Barbara [name]   Chief Complaint  Patient presents with  . Pulmonary Consult    Referred by Dr. Jonathon Jordan. Pt c/o SOB for the past few months. She states she gets SOB with walking up stairs and sometimes with just walking from room to room. She also runs out of breath sometimes when talking alot. She states for the past year she has had congestion in her throat and frequent throat clearing.    new sensation of globus and sob if talks but ok if sitting /sleeping with assoc HB but highly variable, sometimes sob at rest and sometimes room to room.  Sleeps ok but First thing  In am when it's time to get up voice gone but gets some better after clears her throat but s excess/ purulent sputum or mucus plugs  - no better on proair respiclick/clariton /zyrtec (takes both)  And ppi  rec Stop the proair respiclick Please see patient  coordinator before you leave today  to schedule sinus CT  Prednisone 10 mg take  4 each am x 2 days,   2 each am x 2 days,  1 each am x 2 days and stop  dymista one twice daily each nostril  For drainage / throat tickle try take CHLORPHENIRAMINE  4 mg - take one every 4 hours as needed - available over the counter- may cause drowsiness so start with just a bedtime dose or two and see how you tolerate it before trying in daytime   GERD (REFLUX)  Diet      06/01/2016  extended  f/u ov/Barbara [name] re: refractory cough started in 2015 while on ACEi Chief Complaint  Patient presents with  . Follow-up    Breathing is unchanged. She states having sensation something is in her throat.   sleeps fine hs /wakes up nl time with "throat full of mucus but nothing comes up and drinks water and seems to dissipate but never resolve" Main concern is constant globus sensation / voice fatigue / hoarseness worse as day goes on pnds some better with h1 hs and dysmista  Sense of sob and can't get back to ex "afrraid to"   No obvious day to day or daytime variability or assoc excess/ purulent sputum or mucus plugs or hemoptysis or cp or  chest tightness, subjective wheeze or overt sinus or hb symptoms. No unusual exp hx or h/o childhood pna/ asthma or knowledge of premature birth.  Sleeping ok without nocturnal    exacerbation  of respiratory  c/o's or need for noct saba. Also denies any obvious fluctuation of symptoms with weather or environmental changes or other aggravating or alleviating factors except as outlined above   Current Medications, Allergies, Complete Past Medical History, Past Surgical History, Family History, and Social History were reviewed in [employer] record.  ROS  The following are not active complaints unless bolded sore throat, dysphagia, dental problems, itching, sneezing,  nasal congestion or excess/ purulent secretions, ear ache,   fever, chills, sweats, unintended wt loss,  classically pleuritic or exertional cp,  orthopnea pnd or leg swelling, presyncope, palpitations, abdominal pain, anorexia, nausea, vomiting, diarrhea  or change in bowel or bladder habits, change in stools or urine, dysuria,hematuria,  rash, arthralgias, visual complaints, headache, numbness, weakness or ataxia or problems with walking or coordination,  change in mood/affect or memory.              Objective:   Physical Exam    very talkative amb wf  with classic voice fatigue/ freq throat clearing   06/01/2016       186  05/12/16 187 lb (84.8 kg)  03/24/16 185 lb (83.9 kg)  03/15/16 185 lb 6 oz (84.1 kg)    Vital signs reviewed - Note on arrival 02 sats  96 % on RA     HEENT: nl dentition, and oropharynx which is pristine . Nl external ear canals without cough reflex Only mild  bilateral non-specific turbinate edema    NECK :  without JVD/Nodes/TM/ nl carotid upstrokes bilaterally   LUNGS: no acc muscle use,  Nl contour chest which is clear to A and P bilaterally without cough on insp or exp maneuvers   CV:  RRR  no s3 or murmur or increase in P2, and no edema   ABD:  soft and nontender with nl inspiratory excursion in the supine position. No bruits or organomegaly appreciated, bowel sounds nl  MS:  Nl gait/ ext warm without deformities, calf tenderness, cyanosis or clubbing No obvious joint restrictions   SKIN: warm and dry without lesions    NEURO:  alert, approp, nl sensorium with  no motor or cerebellar deficits apparent.     I personally reviewed images and agree with radiology impression as follows:  CXR:   02/23/17 There is no active cardiopulmonary disease.    Assessment:

## 2016-06-01 NOTE — Assessment & Plan Note (Signed)
05/12/2016  Walked RA x 3 laps @ 185 ft each stopped due to  End of study, nl pace, no sob or desat    - 06/01/2016  Walked RA x 3 laps @ 185 ft each stopped due to  End of study, fast pace, min sob and sore ankles no desat  Not able to reproduce this problem so will continue to focus on uacs (see separate a/p)

## 2016-06-01 NOTE — Assessment & Plan Note (Signed)
Body mass index is 32.98 kg/m.  Trending down slt No results found for: TSH   Contributing to gerd risk/ doe/reviewed the need and the process to achieve and maintain neg calorie balance > defer f/u primary care including intermittently monitoring thyroid status

## 2016-06-01 NOTE — Assessment & Plan Note (Addendum)
D/c proair respiclick 07/18/175 and rx prednisone x 6 days - FENO 05/12/2016  =   40  - trial of dymista one bid samples only  - sinus CT 05/22/2016 >  No evidence of sinusitis - Spirometry 06/01/2016 wnl including curvature on no rx but actively taking inderal  - FENO 06/01/2016  =   42  On no rx  - gabapentin 100 tid 06/01/2016 >>>   Although tempting to call this asthma, much more likely it's Upper airway cough syndrome (previously labeled PNDS) , is  so named because it's frequently impossible to sort out how much is  CR/sinusitis with freq throat clearing (which can be related to primary GERD)   vs  causing  secondary (" extra esophageal")  GERD from wide swings in gastric pressure that occur with throat clearing, often  promoting self use of mint and menthol lozenges that reduce the lower esophageal sphincter tone and exacerbate the problem further in a cyclical fashion.   These are the same pts (now being labeled as having "irritable larynx syndrome" by some cough centers) who not infrequently have a history of having failed to tolerate ace inhibitors,  dry powder inhalers or biphosphonates or report having atypical/extraesophageal reflux symptoms that don't respond to standard doses of PPI  and are easily confused as having aecopd or asthma flares by even experienced allergists/ pulmonologists (myself included).   rec continue to rx with meds to eliminate pnds (dymista/ 1st gen h1) and gerd (max acid suppression) and add gabapentin100 tid trial   Discussed in detail all the  indications, usual  risks and alternatives  relative to the benefits with patient who agrees to proceed with conservative f/u as outlined   I had an extended discussion with the patient reviewing all relevant studies completed to date and  lasting 25 minutes of a 40  minute office visit addressing severe refractory    non-specific but potentially very serious  respiratory symptoms of unknown etiology.  Each maintenance  medication was reviewed in detail including most importantly the difference between maintenance and prns and under what circumstances the prns are to be triggered using an action plan format that is not reflected in the computer generated alphabetically organized AVS.    Please see AVS for specific instructions unique to this office visit that I personally wrote and verbalized to the the pt in detail and then reviewed with pt  by my nurse highlighting any changes in therapy/plan of care  recommended at today's visit.    nees to return with all meds in hand using a trust but verify approach to confirm accurate Medication  Reconciliation The principal here is that until we are certain that the  patients are doing what we've asked, it makes no sense to ask them to do more.

## 2016-06-12 ENCOUNTER — Telehealth: Payer: Self-pay | Admitting: Internal Medicine

## 2016-06-12 NOTE — Telephone Encounter (Signed)
atc pt, unnamed woman answered line but seemed unable to hear me- did not respond when  I spoke.  atc back X2, line rang to fast busy signal.  wcb.

## 2016-06-13 NOTE — Telephone Encounter (Addendum)
ATC, NA and VM is closed

## 2016-06-14 NOTE — Telephone Encounter (Signed)
Spoke with the pt  She states that she had started on gabapentin and then noticed swelling and pain her feet and ankles  She states she stopped med and within 2 days her symptoms resolved  She states her cough is not bothering her at this time, and her voice is better  I advised will add gabapentin to allergy list and she will keep ov with TP for 4/20 to discuss more

## 2016-06-30 ENCOUNTER — Ambulatory Visit (INDEPENDENT_AMBULATORY_CARE_PROVIDER_SITE_OTHER): Payer: Medicare Other | Admitting: Adult Health

## 2016-06-30 ENCOUNTER — Other Ambulatory Visit (INDEPENDENT_AMBULATORY_CARE_PROVIDER_SITE_OTHER): Payer: Medicare Other

## 2016-06-30 ENCOUNTER — Encounter: Payer: Self-pay | Admitting: Adult Health

## 2016-06-30 VITALS — BP 128/66 | HR 85 | Ht 63.0 in | Wt 185.4 lb

## 2016-06-30 DIAGNOSIS — J45991 Cough variant asthma: Secondary | ICD-10-CM

## 2016-06-30 DIAGNOSIS — J301 Allergic rhinitis due to pollen: Secondary | ICD-10-CM | POA: Diagnosis not present

## 2016-06-30 DIAGNOSIS — R0609 Other forms of dyspnea: Secondary | ICD-10-CM | POA: Diagnosis not present

## 2016-06-30 LAB — CBC WITH DIFFERENTIAL/PLATELET
Basophils Absolute: 0 10*3/uL (ref 0.0–0.1)
Basophils Relative: 0.4 % (ref 0.0–3.0)
Eosinophils Absolute: 0.1 10*3/uL (ref 0.0–0.7)
Eosinophils Relative: 2 % (ref 0.0–5.0)
HCT: 40.5 % (ref 36.0–46.0)
Hemoglobin: 13.5 g/dL (ref 12.0–15.0)
Lymphocytes Relative: 30.9 % (ref 12.0–46.0)
Lymphs Abs: 1.9 10*3/uL (ref 0.7–4.0)
MCHC: 33.3 g/dL (ref 30.0–36.0)
MCV: 88.7 fl (ref 78.0–100.0)
Monocytes Absolute: 0.6 10*3/uL (ref 0.1–1.0)
Monocytes Relative: 10 % (ref 3.0–12.0)
Neutro Abs: 3.4 10*3/uL (ref 1.4–7.7)
Neutrophils Relative %: 56.7 % (ref 43.0–77.0)
Platelets: 233 10*3/uL (ref 150.0–400.0)
RBC: 4.56 Mil/uL (ref 3.87–5.11)
RDW: 13.3 % (ref 11.5–15.5)
WBC: 6 10*3/uL (ref 4.0–10.5)

## 2016-06-30 NOTE — Patient Instructions (Signed)
Follow med calendar closely and bring to each visit.  Use sips of water to soothe throat .  May try delsym 2 tsp for cough /throat clearing .  Remain off of supplements for now .  Labs today .  Follow up Dr. Melvyn Novas  In 4 weeks and As needed

## 2016-06-30 NOTE — Progress Notes (Signed)
Chart and office note reviewed in detail  > agree with a/p as outlined    

## 2016-06-30 NOTE — Progress Notes (Signed)
@Patient  ID: Barbara [name], female    DOB: 10-Dec-1950, 66 y.o.   MRN: [id number]  No chief complaint on file.   Referring provider: Jonathon Jordan, MD  HPI: 66 yo female never smoker (secondhand smoke exposure) seen for pulmonary consult 05/2016 for sob and throat clearing .  ACE cough 2015  TEST  D/c proair respiclick 11/18/9890 and rx prednisone x 6 days - FENO 05/12/2016  =   40  - trial of dymista one bid samples only  - sinus CT 05/22/2016 > No evidence of sinusitis - Spirometry 06/01/2016 wnl including curvature on no rx but actively taking inderal  - FENO 06/01/2016  =   42  On no rx  - gabapentin 100 tid 06/01/2016 >>> unable to tolerate - CXR 02/2016 cleaer  - med calendar 06/30/2016    06/30/2016 Follow up : Cough Bonnita Nasuti Clearing /?Irritable Larynx Syndrome  Pt returns for a 1 month follow up. She says she has had chronic allergies for years and is followed by Dr. [name] in allergy . Complains over last 4 month allergy symptoms have been worse with constant throat clearing , sensation something is stuck in throat, runny nose, sinus congestion , min dry cough and episodes of shortness of breath. She was seen by Cardiology in Dec 2017 with neg cardiac stress test . Echo showed nml EF , Gr 2 DD , and PAP 17mmHg.  Patient was recommended to stop all of her supplements. Begin Chlor-Trimeton for postnasal drip and throat clearing. She was started on gabapentin but says she was unable to tolerate this due to many side effects. She was instructed to stay on Prilosec. Dimysta  was added.  She returns today without significant change in her symptoms. Throat clearing is some better but she continues to have sinus drainage, and sensations of intermittent shortness of breath. Spirometry was normal with no airflow obstruction. CT sinus showed no evidence of sinusitis. Her exhaled nitric  oxide testing was elevated at 40.  She has tried inhalers in the past, although unclear which ones that she has  tried without significant improvement. Has seen ENT in past x 2 reported normal upper Vocal cords.   We reviewed all her medications organize them into a medication calendar with patient education. Appears to be taking her medications correctly  Allergies  Allergen Reactions  . Ace Inhibitors     Pt unsure- nausea or hives  . Amoxicillin Hives  . Celebrex [Celecoxib] Other (See Comments)    Severe stomach cramps  . Enalapril Other (See Comments)    congestion  . Fentanyl Nausea And Vomiting  . Fish Oil Other (See Comments)    Broke out with blisters  . Gabapentin     Pain and swelling   . Lanolin Acid [Lanolin]     rash  . Nickel     rash  . Other Nausea And Vomiting    Narcotic Pain Killers- vomitting, nausea constantly  . Penicillins Hives    Has patient had a PCN reaction causing immediate rash, facial/tongue/throat swelling, SOB or lightheadedness with hypotension: unknown Has patient had a PCN reaction causing severe rash involving mucus membranes or skin necrosis: no Has patient had a PCN reaction that required hospitalization - no, was at MD office Has patient had a PCN reaction occurring within the last 10 years: no If all of the above answers are "NO", then may proceed with Cephalosporin use.   Marland Kitchen Propoxyphene Hcl Nausea Only  . Sulfonamide Derivatives Nausea  Only and Other (See Comments)    Stomach cramping  . Synvisc [Hylan G-F 20]     Made knee swell up at injection site    Immunization History  Administered Date(s) Administered  . Influenza,inj,Quad PF,36+ Mos 11/29/2015  . Influenza,inj,quad, With Preservative 12/24/2013, 12/21/2014  . Pneumococcal Conjugate-13 12/21/2014  . Pneumococcal Polysaccharide-23 01/13/2016  . Tdap 04/03/2012    Past Medical History:  Diagnosis Date  . Allergy   . Anxiety   . Arthritis    knees  . Cancer ([place])    basal cell- nose  . GERD (gastroesophageal reflux disease)   . History of skin cancer   . Hyperlipidemia   .  Hypertension   . Knee pain, left   . Neuromuscular disorder ([place])    benign tremor- takes Propanolol  . Occasional tremors   . PONV (postoperative nausea and vomiting)    "BP DROPS ALSO"  . Trigeminal neuralgia of right side of face     Tobacco History: History  Smoking Status  . Never Smoker  Smokeless Tobacco  . Never Used   Counseling given: Not Answered   Outpatient Encounter Prescriptions as of 06/30/2016  Medication Sig  . acetaminophen (TYLENOL) 500 MG tablet Take 1,000 mg by mouth every 6 (six) hours as needed for mild pain or moderate pain.   . Ascorbic Acid (VITAMIN C) 1000 MG tablet Take 1,000 mg by mouth daily.  Marland Kitchen aspirin EC 81 MG tablet Take 81 mg by mouth at bedtime.   . Azelastine-Fluticasone 137-50 MCG/ACT SUSP Place 1 spray into the nose 2 (two) times daily.  . beta carotene w/minerals (OCUVITE) tablet Take 1 tablet by mouth daily.  Marland Kitchen CALCIUM-VITAMIN D PO Take 1,200 mg by mouth daily.  . carboxymethylcellulose (REFRESH PLUS) 0.5 % SOLN 1 drop 3 (three) times daily as needed.  . chlorpheniramine (CHLOR-TRIMETON) 4 MG tablet Take 4 mg by mouth every 4 (four) hours as needed for allergies.  . Cholecalciferol (VITAMIN D-3) 1000 UNITS CAPS Take 1 capsule by mouth daily.  . cyanocobalamin 1000 MCG tablet Take 5,000 mcg by mouth daily.  Marland Kitchen gabapentin (NEURONTIN) 100 MG capsule Take 1 capsule (100 mg total) by mouth 3 (three) times daily. One three times daily  . milk thistle 175 MG tablet Take 175 mg by mouth 2 (two) times daily.  Marland Kitchen omeprazole (PRILOSEC) 20 MG capsule TAKE 1 CAPSULE (20 MG TOTAL) BY MOUTH 2 (TWO) TIMES DAILY BEFORE A MEAL.  . prednisoLONE acetate (PRED FORTE) 1 % ophthalmic suspension Place 1 drop into both eyes daily.   . Probiotic Product (ALIGN) 4 MG CAPS Take 1 capsule by mouth daily.  . propranolol (INDERAL) 10 MG tablet Take 10 mg by mouth 3 (three) times daily as needed (tremors). Pt takes Twice a day daily, and if needed for tremors may take an  extra dose.  Marland Kitchen Propylene Glycol 0.6 % SOLN Apply 1 drop to eye daily.  . psyllium (METAMUCIL SMOOTH TEXTURE) 28 % packet Take 1 packet by mouth 2 (two) times daily.   Marland Kitchen pyridOXINE (VITAMIN B-6) 100 MG tablet Take 100 mg by mouth daily.  . Red Yeast Rice 600 MG TABS Take 1 tablet by mouth 2 (two) times daily.   Facility-Administered Encounter Medications as of 06/30/2016  Medication  . 0.9 %  sodium chloride infusion     Review of Systems  Constitutional:   No  weight loss, night sweats,  Fevers, chills, fatigue, or  lassitude.  HEENT:   No headaches,  Difficulty swallowing,  Tooth/dental problems, or  Sore throat,                No sneezing, itching, ear ache, + nasal congestion, post nasal drip,   CV:  No chest pain,  Orthopnea, PND, swelling in lower extremities, anasarca, dizziness, palpitations, syncope.   GI  No heartburn, indigestion, abdominal pain, nausea, vomiting, diarrhea, change in bowel habits, loss of appetite, bloody stools.   Resp:   No wheezing.  No chest wall deformity  Skin: no rash or lesions.  GU: no dysuria, change in color of urine, no urgency or frequency.  No flank pain, no hematuria   MS:  No joint pain or swelling.  No decreased range of motion.  No back pain.    Physical Exam  BP 128/66 (BP Location: Left Arm, Cuff Size: Normal)   Pulse 85   Ht 5\' 3"  (1.6 m)   Wt 185 lb 6.4 oz (84.1 kg)   SpO2 98%   BMI 32.84 kg/m   GEN: A/Ox3; pleasant , NAD, obese    HEENT:  Cross/AT,  EACs-clear, TMs-wnl, NOSE-clear, drainage  THROAT-clear, no lesions, no postnasal drip or exudate noted.   NECK:  Supple w/ fair ROM; no JVD; normal carotid impulses w/o bruits; no thyromegaly or nodules palpated; no lymphadenopathy.    RESP  Clear  P & A; w/o, wheezes/ rales/ or rhonchi. no accessory muscle use, no dullness to percussion  CARD:  RRR, no m/r/g, no peripheral edema, pulses intact, no cyanosis or clubbing.  GI:   Soft & nt; nml bowel sounds; no organomegaly  or masses detected.   Musco: Warm bil, no deformities or joint swelling noted.   Neuro: alert, no focal deficits noted.    Skin: Warm, no lesions or rashes    Lab Results:  CBC    Component Value Date/Time   WBC 7.1 02/24/2016 1348   RBC 4.49 02/24/2016 1348   HGB 13.3 02/24/2016 1348   HCT 39.5 02/24/2016 1348   PLT 241 02/24/2016 1348   MCV 88.0 02/24/2016 1348   MCH 29.6 02/24/2016 1348   MCHC 33.7 02/24/2016 1348   RDW 12.8 02/24/2016 1348    BMET    Component Value Date/Time   NA 140 02/24/2016 1348   K 4.2 02/24/2016 1348   CL 106 02/24/2016 1348   CO2 28 02/24/2016 1348   GLUCOSE 98 02/24/2016 1348   BUN 16 02/24/2016 1348   CREATININE 0.76 02/24/2016 1348   CALCIUM 9.1 02/24/2016 1348   GFRNONAA >60 02/24/2016 1348   GFRAA >60 02/24/2016 1348    BNP No results found for: BNP  ProBNP No results found for: PROBNP  Imaging: No results found.   Assessment & Plan:   No problem-specific Assessment & Plan notes found for this encounter.     Rexene Edison, NP 06/30/2016

## 2016-06-30 NOTE — Assessment & Plan Note (Signed)
No desats walking , spirometry is nml , cxr is clear  ? Etiology . Cont to follow to see if improves with upper airway irritatiion improves.

## 2016-06-30 NOTE — Assessment & Plan Note (Signed)
Cont to control for triggers

## 2016-06-30 NOTE — Assessment & Plan Note (Signed)
Upper airway cough /irritable larynx syndrome -thus far evaluation has been unrevealing with neg CT sinus , clear CXR  And normal spirometry .  FENO was elevated ? Asthma although reported no sign improvement with inhalers  Check CBC to look at eosinophils.  Check IgE .  Suspect triggers of AR /GERD . Cont to control for triggers  ,Patient's medications were reviewed today and patient education was given. Computerized medication calendar was adjusted/completed     Plan  Patient Instructions  Follow med calendar closely and bring to each visit.  Use sips of water to soothe throat .  May try delsym 2 tsp for cough /throat clearing .  Remain off of supplements for now .  Labs today .  Follow up Dr. Melvyn Novas  In 4 weeks and As needed

## 2016-07-02 ENCOUNTER — Other Ambulatory Visit: Payer: Self-pay | Admitting: Gastroenterology

## 2016-07-03 LAB — IGE: IgE (Immunoglobulin E), Serum: 40 kU/L (ref <115)

## 2016-07-03 NOTE — Addendum Note (Signed)
Addended by: Parke Poisson E on: 07/03/2016 05:28 PM   Modules accepted: Orders

## 2016-07-11 ENCOUNTER — Other Ambulatory Visit: Payer: Self-pay

## 2016-07-11 ENCOUNTER — Telehealth: Payer: Self-pay

## 2016-07-11 MED ORDER — OMEPRAZOLE 40 MG PO CPDR: 40.0000 mg | DELAYED_RELEASE_CAPSULE | Freq: Two times a day (BID) | ORAL | 3 refills | Status: DC

## 2016-07-11 NOTE — Telephone Encounter (Signed)
Received fax refill request for Omeprazle 40mg  BID from [facility]. Per Endo note pt can continue on Omeprazole 40 BID. Refill sent.

## 2016-07-20 ENCOUNTER — Other Ambulatory Visit: Payer: Self-pay

## 2016-07-20 MED ORDER — OMEPRAZOLE 20 MG PO CPDR: 20.0000 mg | DELAYED_RELEASE_CAPSULE | Freq: Two times a day (BID) | ORAL | 3 refills | Status: DC

## 2016-07-20 NOTE — Progress Notes (Unsigned)
Received fax from pharmacy that pt needs 90 day supply of med. Sent #[phone]- Omeprazole 20mg  BID to [facility]

## 2016-07-28 ENCOUNTER — Ambulatory Visit (INDEPENDENT_AMBULATORY_CARE_PROVIDER_SITE_OTHER): Payer: Medicare Other | Admitting: Internal Medicine

## 2016-07-28 ENCOUNTER — Encounter: Payer: Self-pay | Admitting: Internal Medicine

## 2016-07-28 VITALS — BP 126/84 | HR 73 | Ht 63.0 in | Wt 185.6 lb

## 2016-07-28 DIAGNOSIS — J45991 Cough variant asthma: Secondary | ICD-10-CM

## 2016-07-28 LAB — NITRIC OXIDE: Nitric Oxide: 38

## 2016-07-28 MED ORDER — OMEPRAZOLE 40 MG PO CPDR: 40.0000 mg | DELAYED_RELEASE_CAPSULE | Freq: Two times a day (BID) | ORAL | Status: DC

## 2016-07-28 NOTE — Patient Instructions (Addendum)
Try off chlorpheniramine and clariton  Change omeprazole to 40 mg Take 30- 60 min before your first and last meals of the day just until you see [name] top tell whether this helps or not   If better, this clearly GI related, if worse, it's definitely allergy related   GERD (REFLUX)  is an extremely common cause of respiratory symptoms just like yours , many times with no obvious heartburn at all.    It can be treated with medication, but also with lifestyle changes including elevation of the head of your bed (ideally with 6 inch  bed blocks),  Smoking cessation, avoidance of late meals, excessive alcohol, and avoid fatty foods, chocolate, peppermint, colas, red wine, and acidic juices such as orange juice.  NO MINT OR MENTHOL PRODUCTS SO NO COUGH DROPS  USE SUGARLESS CANDY INSTEAD (Jolley ranchers or Stover's or Life Savers) or even ice chips will also do - the key is to swallow to prevent all throat clearing. NO OIL BASED VITAMINS - use powdered substitutes.     If not better to your satisfaction after these recommendations, return after Sharma's next evaluation and we will regroup.   See calendar for specific medication instructions and bring it back for each and every office visit for every healthcare provider you see.  Without it,  you may not receive the best quality medical care that we feel you deserve.  You will note that the calendar groups together  your maintenance  medications that are timed at particular times of the day.  Think of this as your checklist for what your doctor has instructed you to do until your next evaluation to see what benefit  there is  to staying on a consistent group of medications intended to keep you well.  The other group at the bottom is entirely up to you to use as you see fit  for specific symptoms that may arise between visits that require you to treat them on an as needed basis.  Think of this as your action plan or "what if" list.   Separating the top  medications from the bottom group is fundamental to providing you adequate care going forward.

## 2016-07-28 NOTE — Progress Notes (Signed)
Subjective:     Patient ID: Barbara [name], female   DOB: 1950/08/01    MRN: [id number]    Brief patient profile:  39 yowf never smoker but some passive exp remembers as "long as she can remember"  itchy eye runny nose mostly in spring persisted thru childhood got worse age 66 more of a year round  eval by allergy > everything > allergy shots x 2 year helped to point where only needed antihistimes then in late 30's living in [place] same symptoms > Barbara [name] same allergies > shots helped x 2 years> same benefit still needed antihistamines springtime > then sinus surgery early 67s > made a lot of difference but still needed antihistamines in spring time then despite living same house x 30 years noted new sensation of something stuck in throat around age 1 around 2015 not just in spring while on ACEi = enalpril > persisted and ENT eval x 2 = yeast infection > no change in symptoms > remained on propranolol because it also helped her tremors and new sob x 2017 referred to pulmonary clinic 05/12/2016 by Dr   Barbara [name]     History of Present Illness  05/12/2016 1st [facility] office visit/ Barbara [name]   Chief Complaint  Patient presents with  . Pulmonary Consult    Referred by Dr. Jonathon [name]. Pt c/o SOB for the past few months. She states she gets SOB with walking up stairs and sometimes with just walking from room to room. She also runs out of breath sometimes when talking alot. She states for the past year she has had congestion in her throat and frequent throat clearing.    new sensation of globus and sob if talks but ok if sitting /sleeping with assoc HB but highly variable, sometimes sob at rest and sometimes room to room.  Sleeps ok but First thing  In am when it's time to get up voice gone but gets some better after clears her throat but s excess/ purulent sputum or mucus plugs  - no better on proair respiclick/clariton /zyrtec (takes both)  And ppi  rec Stop the proair respiclick Please see  patient coordinator before you leave today  to schedule sinus CT  Prednisone 10 mg take  4 each am x 2 days,   2 each am x 2 days,  1 each am x 2 days and stop  dymista one twice daily each nostril  For drainage / throat tickle try take CHLORPHENIRAMINE  4 mg - take one every 4 hours as needed - available over the counter- may cause drowsiness so start with just a bedtime dose or two and see how you tolerate it before trying in daytime   GERD (REFLUX)  Diet      06/01/2016  extended  f/u ov/Barbara [name] re: refractory cough started in 2015 while on ACEi Chief Complaint  Patient presents with  . Follow-up    Breathing is unchanged. She states having sensation something is in her throat.   sleeps fine hs /wakes up nl time with "throat full of mucus but nothing comes up and drinks water and seems to dissipate but never resolve" Main concern is constant globus sensation / voice fatigue / hoarseness worse as day goes on pnds some better with h1 hs and dysmista  Sense of sob and can't get back to ex "afraid to" rec Gabapentin 100 mg three times a day with meals >  For drainage / throat tickle try take CHLORPHENIRAMINE  4 mg - take one every 4 hours as needed - available over the counter- may cause drowsiness so start with just a bedtime dose or two and see how you tolerate it before trying in daytime   Dymista one twice daily (ok to substitute astelin and flonase if can't afford it)  Change prilosec to where you take it 40 mg Take 30-60 min before first meal of the day and pepcid 20 mg at bedtime      06/30/16 NP rec Follow med calendar closely and bring to each visit.  Use sips of water to soothe throat .  May try delsym 2 tsp for cough /throat clearing .  Remain off of supplements for now .       07/28/2016  f/u ov/Barbara [name] re: cough since 2015 / no med calendar  Chief Complaint  Patient presents with  . Follow-up    She has had some wheezing recently and her voice has been raspy.   "wheezing"  around 6pm p supper occasionally  No trouble sleeping p h1 x 2 and levocetrizine at hs    Really Not limited by breathing from desired activities    No obvious day to day or daytime variability or assoc excess/ purulent sputum or mucus plugs or hemoptysis or cp or chest tightness, or overt sinus or hb symptoms. No unusual exp hx or h/o childhood pna/ asthma or knowledge of premature birth.  Sleeping ok without nocturnal  or early am exacerbation  of respiratory  c/o's or need for noct saba. Also denies any obvious fluctuation of symptoms with weather or environmental changes or other aggravating or alleviating factors except as outlined above   Current Medications, Allergies, Complete Past Medical History, Past Surgical History, Family History, and Social History were reviewed in [employer] record.  ROS  The following are not active complaints unless bolded sore throat, dysphagia, dental problems, itching, sneezing,  nasal congestion or excess/ purulent secretions, ear ache,   fever, chills, sweats, unintended wt loss, classically pleuritic or exertional cp,  orthopnea pnd or leg swelling, presyncope, palpitations, abdominal pain, anorexia, nausea, vomiting, diarrhea  or change in bowel or bladder habits, change in stools or urine, dysuria,hematuria,  rash, arthralgias, visual complaints, headache, numbness, weakness or ataxia or problems with walking or coordination,  change in mood/affect or memory.                           Objective:   Physical Exam  amb wf  nad / mild voice fatigue   07/30/2016       185  06/01/2016       186  05/12/16 187 lb (84.8 kg)  03/24/16 185 lb (83.9 kg)  03/15/16 185 lb 6 oz (84.1 kg)    Vital signs reviewed   - Note on arrival 02 sats  97% on RA     HEENT: nl dentition, and oropharynx which is pristine . Nl external ear canals without cough reflex Only mild  bilateral non-specific turbinate edema    NECK :  without  JVD/Nodes/TM/ nl carotid upstrokes bilaterally   LUNGS: no acc muscle use,  Nl contour chest which is clear to A and P bilaterally without cough on insp or exp maneuvers   CV:  RRR  no s3 or murmur or increase in P2, and no edema   ABD:  soft and nontender with nl inspiratory excursion in the supine position. No bruits or organomegaly  appreciated, bowel sounds nl  MS:  Nl gait/ ext warm without deformities, calf tenderness, cyanosis or clubbing No obvious joint restrictions   SKIN: warm and dry without lesions    NEURO:  alert, approp, nl sensorium with  no motor or cerebellar deficits apparent.     I personally reviewed images and agree with radiology impression as follows:  CXR:   02/23/17 There is no active cardiopulmonary disease.    Assessment:       Outpatient Encounter Prescriptions as of 07/28/2016  Medication Sig  . aspirin EC 81 MG tablet Take 81 mg by mouth at bedtime.   . carboxymethylcellulose (REFRESH PLUS) 0.5 % SOLN Place 1 drop into both eyes 2 (two) times daily.   Marland Kitchen dextromethorphan (DELSYM) 30 MG/5ML liquid 2 tsp by mouth every 12 hours as needed for cough  . Hypertonic Nasal Wash (SINUS RINSE BOTTLE KIT NA) Use as needed  . levocetirizine (XYZAL) 5 MG tablet Take 5 mg by mouth every evening.  . prednisoLONE acetate (PRED FORTE) 1 % ophthalmic suspension Place 1 drop into both eyes daily.   . Probiotic Product (ALIGN) 4 MG CAPS Take 1 capsule by mouth daily.  . propranolol (INDERAL) 10 MG tablet Take 10 mg by mouth 2 (two) times daily.   Dema Severin Petrolatum-Mineral Oil (SYSTANE NIGHTTIME) OINT Apply to eye as needed.  . [DISCONTINUED] Azelastine-Fluticasone 137-50 MCG/ACT SUSP Place 1 spray into the nose 2 (two) times daily.  . [DISCONTINUED] chlorpheniramine (CHLOR-TRIMETON) 4 MG tablet Take 8 mg by mouth at bedtime.   . [DISCONTINUED] Guaifenesin (MUCINEX MAXIMUM STRENGTH) 1200 MG TB12 Take 1 tablet by mouth 2 (two) times daily as needed.  . [DISCONTINUED]  loratadine (CLARITIN) 10 MG tablet Take 10 mg by mouth daily.  . [DISCONTINUED] omeprazole (PRILOSEC) 20 MG capsule Take 1 capsule (20 mg total) by mouth 2 (two) times daily before a meal.  . omeprazole (PRILOSEC) 40 MG capsule Take 1 capsule (40 mg total) by mouth 2 (two) times daily.  . [DISCONTINUED] omeprazole (PRILOSEC) 40 MG capsule Take 1 capsule (40 mg total) by mouth 2 (two) times daily.   Facility-Administered Encounter Medications as of 07/28/2016  Medication  . 0.9 %  sodium chloride infusion

## 2016-07-30 ENCOUNTER — Encounter: Payer: Self-pay | Admitting: Internal Medicine

## 2016-07-30 NOTE — Assessment & Plan Note (Signed)
D/c proair respiclick 09/19/4799 and rx prednisone x 6 days - FENO 05/12/2016  =   40  - trial of dymista one bid samples only  - sinus CT 05/22/2016 >  No evidence of sinusitis - Spirometry 06/01/2016 wnl including curvature on no rx but actively taking inderal  - FENO 06/01/2016  =   42  On no rx  - gabapentin 100 tid 06/01/2016 >>> intolerant  - 06/30/16   IgE  40 and Eos 0.1   - FENO 07/28/2016  =   38 on singulair  - Spirometry 07/28/2016   wnl  - 07/28/2016 try off clariton/chlorpheniramine and just use xyzal and  max gerd rx to see if flares and if so return to Allergy, if not this may be GI related esp with the reported "wheezing" after supper noting that although she had prev neg es pH study, this was on high doses of PPI which which she is no longer taking  If still not clear as to cause, may need refer to [facility] / Dr Joya Gaskins as I see very little evidence of asthma here and don't see a reason to do MCT in a pt on inderal as likely would create a false positive - on the other hand if it can be stopped or switched to bisoprolol for a few days prior to study, then might reconsider.   Pulmonary f/u is prn    I had an extended discussion with the patient reviewing all relevant studies completed to date and  lasting 15 to 20 minutes of a 25 minute visit    Each maintenance medication was reviewed in detail including most importantly the difference between maintenance and prns and under what circumstances the prns are to be triggered using an action plan format that is not reflected in the computer generated alphabetically organized AVS but trather by a customized med calendar that reflects the AVS meds with confirmed 100% correlation.   In addition, Please see AVS for unique instructions that I personally wrote and verbalized to the the pt in detail and then reviewed with pt  by my nurse highlighting any  changes in therapy recommended at today's visit to their plan of care.

## 2016-08-29 DIAGNOSIS — H15003 Unspecified scleritis, bilateral: Secondary | ICD-10-CM | POA: Diagnosis present

## 2016-09-16 ENCOUNTER — Observation Stay (HOSPITAL_COMMUNITY): Payer: Medicare Other

## 2016-09-16 ENCOUNTER — Emergency Department (HOSPITAL_COMMUNITY): Payer: Medicare Other

## 2016-09-16 ENCOUNTER — Observation Stay (HOSPITAL_COMMUNITY)
Admission: EM | Admit: 2016-09-16 | Discharge: 2016-09-19 | Disposition: A | Discharge status: Home / Self Care | Payer: Medicare Other | Attending: Internal Medicine | Admitting: Internal Medicine

## 2016-09-16 ENCOUNTER — Encounter (HOSPITAL_COMMUNITY): Payer: Self-pay | Admitting: Internal Medicine

## 2016-09-16 ENCOUNTER — Other Ambulatory Visit: Payer: Self-pay

## 2016-09-16 DIAGNOSIS — Z882 Allergy status to sulfonamides status: Secondary | ICD-10-CM | POA: Insufficient documentation

## 2016-09-16 DIAGNOSIS — E7800 Pure hypercholesterolemia, unspecified: Secondary | ICD-10-CM | POA: Diagnosis present

## 2016-09-16 DIAGNOSIS — R0789 Other chest pain: Secondary | ICD-10-CM | POA: Diagnosis not present

## 2016-09-16 DIAGNOSIS — H15003 Unspecified scleritis, bilateral: Secondary | ICD-10-CM | POA: Diagnosis present

## 2016-09-16 DIAGNOSIS — Z85828 Personal history of other malignant neoplasm of skin: Secondary | ICD-10-CM | POA: Insufficient documentation

## 2016-09-16 DIAGNOSIS — R9431 Abnormal electrocardiogram [ECG] [EKG]: Secondary | ICD-10-CM

## 2016-09-16 DIAGNOSIS — I200 Unstable angina: Secondary | ICD-10-CM | POA: Diagnosis present

## 2016-09-16 DIAGNOSIS — E785 Hyperlipidemia, unspecified: Secondary | ICD-10-CM | POA: Insufficient documentation

## 2016-09-16 DIAGNOSIS — I10 Essential (primary) hypertension: Secondary | ICD-10-CM | POA: Diagnosis present

## 2016-09-16 DIAGNOSIS — Z880 Allergy status to penicillin: Secondary | ICD-10-CM | POA: Insufficient documentation

## 2016-09-16 DIAGNOSIS — G250 Essential tremor: Secondary | ICD-10-CM | POA: Diagnosis present

## 2016-09-16 DIAGNOSIS — F419 Anxiety disorder, unspecified: Secondary | ICD-10-CM | POA: Insufficient documentation

## 2016-09-16 DIAGNOSIS — K219 Gastro-esophageal reflux disease without esophagitis: Secondary | ICD-10-CM | POA: Diagnosis present

## 2016-09-16 DIAGNOSIS — Z7982 Long term (current) use of aspirin: Secondary | ICD-10-CM | POA: Diagnosis not present

## 2016-09-16 DIAGNOSIS — G500 Trigeminal neuralgia: Secondary | ICD-10-CM | POA: Diagnosis not present

## 2016-09-16 DIAGNOSIS — H5340 Unspecified visual field defects: Secondary | ICD-10-CM

## 2016-09-16 DIAGNOSIS — R200 Anesthesia of skin: Secondary | ICD-10-CM | POA: Diagnosis present

## 2016-09-16 DIAGNOSIS — R079 Chest pain, unspecified: Secondary | ICD-10-CM

## 2016-09-16 DIAGNOSIS — I208 Other forms of angina pectoris: Secondary | ICD-10-CM

## 2016-09-16 DIAGNOSIS — R072 Precordial pain: Secondary | ICD-10-CM | POA: Diagnosis not present

## 2016-09-16 LAB — APTT: aPTT: 59 seconds — ABNORMAL HIGH (ref 24–36)

## 2016-09-16 LAB — COMPREHENSIVE METABOLIC PANEL
ALT: 19 U/L (ref 14–54)
AST: 18 U/L (ref 15–41)
Albumin: 3.6 g/dL (ref 3.5–5.0)
Alkaline Phosphatase: 56 U/L (ref 38–126)
Anion gap: 8 (ref 5–15)
BUN: 17 mg/dL (ref 6–20)
CO2: 26 mmol/L (ref 22–32)
Calcium: 8.5 mg/dL — ABNORMAL LOW (ref 8.9–10.3)
Chloride: 106 mmol/L (ref 101–111)
Creatinine, Ser: 0.83 mg/dL (ref 0.44–1.00)
GFR calc Af Amer: >60 mL/min (ref >60)
GFR calc non Af Amer: >60 mL/min (ref >60)
Glucose, Bld: 130 mg/dL — ABNORMAL HIGH (ref 65–99)
Potassium: 3.6 mmol/L (ref 3.5–5.1)
Sodium: 140 mmol/L (ref 135–145)
Total Bilirubin: 1.1 mg/dL (ref 0.3–1.2)
Total Protein: 6.5 g/dL (ref 6.5–8.1)

## 2016-09-16 LAB — CBC WITH DIFFERENTIAL/PLATELET
Basophils Absolute: 0 10*3/uL (ref 0.0–0.1)
Basophils Relative: 0 %
Eosinophils Absolute: 0.1 10*3/uL (ref 0.0–0.7)
Eosinophils Relative: 1 %
HCT: 39.9 % (ref 36.0–46.0)
Hemoglobin: 13.2 g/dL (ref 12.0–15.0)
Lymphocytes Relative: 36 %
Lymphs Abs: 5 10*3/uL — ABNORMAL HIGH (ref 0.7–4.0)
MCH: 29.5 pg (ref 26.0–34.0)
MCHC: 33.1 g/dL (ref 30.0–36.0)
MCV: 89.1 fL (ref 78.0–100.0)
Monocytes Absolute: 1.1 10*3/uL — ABNORMAL HIGH (ref 0.1–1.0)
Monocytes Relative: 8 %
Neutro Abs: 7.9 10*3/uL — ABNORMAL HIGH (ref 1.7–7.7)
Neutrophils Relative %: 55 %
Platelets: 298 10*3/uL (ref 150–400)
RBC: 4.48 MIL/uL (ref 3.87–5.11)
RDW: 13.8 % (ref 11.5–15.5)
WBC: 14.1 10*3/uL — ABNORMAL HIGH (ref 4.0–10.5)

## 2016-09-16 LAB — TROPONIN I
Troponin I: <0.03 ng/mL (ref <0.03)
Troponin I: <0.03 ng/mL (ref <0.03)

## 2016-09-16 LAB — CK TOTAL AND CKMB (NOT AT ARMC)
CK, MB: 1.7 ng/mL (ref 0.5–5.0)
Relative Index: INVALID (ref 0.0–2.5)
Total CK: 24 U/L — ABNORMAL LOW (ref 38–234)

## 2016-09-16 LAB — I-STAT TROPONIN, ED: Troponin i, poc: 0 ng/mL (ref 0.00–0.08)

## 2016-09-16 LAB — MRSA PCR SCREENING: MRSA by PCR: NEGATIVE

## 2016-09-16 LAB — HEPARIN LEVEL (UNFRACTIONATED): Heparin Unfractionated: 0.44 IU/mL (ref 0.30–0.70)

## 2016-09-16 LAB — PROTIME-INR
INR: 1.09
Prothrombin Time: 14.1 seconds (ref 11.4–15.2)

## 2016-09-16 MED ORDER — PROPRANOLOL HCL 10 MG PO TABS
10.0000 mg | ORAL_TABLET | Freq: Two times a day (BID) | ORAL | Status: DC
  Administered 2016-09-17 – 2016-09-19 (×6): 10 mg via ORAL
  Filled 2016-09-16 (×7): qty 1

## 2016-09-16 MED ORDER — ACETAMINOPHEN 325 MG PO TABS: 650.0000 mg | ORAL_TABLET | ORAL | Status: DC | PRN

## 2016-09-16 MED ORDER — ASPIRIN 325 MG PO TABS
325.0000 mg | ORAL_TABLET | Freq: Once | ORAL | Status: AC
  Administered 2016-09-16: 325 mg via ORAL
  Filled 2016-09-16: qty 1

## 2016-09-16 MED ORDER — ASPIRIN 81 MG PO CHEW
81.0000 mg | CHEWABLE_TABLET | ORAL | Status: AC
  Administered 2016-09-18: 81 mg via ORAL
  Filled 2016-09-16: qty 1

## 2016-09-16 MED ORDER — NITROGLYCERIN 0.4 MG SL SUBL
0.4000 mg | SUBLINGUAL_TABLET | SUBLINGUAL | Status: DC | PRN
  Filled 2016-09-16: qty 1

## 2016-09-16 MED ORDER — FOLIC ACID 1 MG PO TABS
1.0000 mg | ORAL_TABLET | Freq: Every day | ORAL | Status: DC
  Administered 2016-09-17 – 2016-09-19 (×3): 1 mg via ORAL
  Filled 2016-09-16 (×3): qty 1

## 2016-09-16 MED ORDER — SODIUM CHLORIDE 0.9 % WEIGHT BASED INFUSION
1.0000 mL/kg/h | INTRAVENOUS | Status: DC
  Administered 2016-09-18 (×2): 1 mL/kg/h via INTRAVENOUS

## 2016-09-16 MED ORDER — GI COCKTAIL ~~LOC~~: 30.0000 mL | Freq: Four times a day (QID) | ORAL | Status: DC | PRN

## 2016-09-16 MED ORDER — SODIUM CHLORIDE 0.9 % WEIGHT BASED INFUSION
3.0000 mL/kg/h | INTRAVENOUS | Status: DC
  Administered 2016-09-18: 3 mL/kg/h via INTRAVENOUS

## 2016-09-16 MED ORDER — ONDANSETRON HCL 4 MG/2ML IJ SOLN: 4.0000 mg | Freq: Four times a day (QID) | INTRAMUSCULAR | Status: DC | PRN

## 2016-09-16 MED ORDER — LEVOCETIRIZINE DIHYDROCHLORIDE 5 MG PO TABS: 5.0000 mg | ORAL_TABLET | Freq: Every day | ORAL | Status: DC | PRN

## 2016-09-16 MED ORDER — HEPARIN BOLUS VIA INFUSION
4000.0000 [IU] | Freq: Once | INTRAVENOUS | Status: AC
  Administered 2016-09-16: 4000 [IU] via INTRAVENOUS
  Filled 2016-09-16: qty 4000

## 2016-09-16 MED ORDER — GADOBENATE DIMEGLUMINE 529 MG/ML IV SOLN
15.0000 mL | Freq: Once | INTRAVENOUS | Status: AC
  Administered 2016-09-16: 15 mL via INTRAVENOUS

## 2016-09-16 MED ORDER — ASPIRIN EC 325 MG PO TBEC
325.0000 mg | DELAYED_RELEASE_TABLET | Freq: Every day | ORAL | Status: DC
Start: 2016-09-17 — End: 2016-09-19
  Administered 2016-09-17 – 2016-09-19 (×2): 325 mg via ORAL
  Filled 2016-09-16 (×3): qty 1

## 2016-09-16 MED ORDER — PANTOPRAZOLE SODIUM 40 MG PO TBEC
40.0000 mg | DELAYED_RELEASE_TABLET | Freq: Every day | ORAL | Status: DC
  Administered 2016-09-17 – 2016-09-19 (×4): 40 mg via ORAL
  Filled 2016-09-16 (×4): qty 1

## 2016-09-16 MED ORDER — PREDNISOLONE ACETATE 1 % OP SUSP
1.0000 [drp] | Freq: Every day | OPHTHALMIC | Status: DC
  Filled 2016-09-16: qty 1

## 2016-09-16 MED ORDER — HEPARIN SODIUM (PORCINE) 5000 UNIT/ML IJ SOLN: 5000.0000 [IU] | Freq: Three times a day (TID) | INTRAMUSCULAR | Status: DC

## 2016-09-16 MED ORDER — PREDNISONE 20 MG PO TABS
40.0000 mg | ORAL_TABLET | Freq: Every day | ORAL | Status: DC
  Filled 2016-09-16: qty 2

## 2016-09-16 MED ORDER — SODIUM CHLORIDE 0.9 % IV SOLN: 250.0000 mL | INTRAVENOUS | Status: DC | PRN

## 2016-09-16 MED ORDER — SODIUM CHLORIDE 0.9% FLUSH
3.0000 mL | Freq: Two times a day (BID) | INTRAVENOUS | Status: DC
  Administered 2016-09-17 (×3): 3 mL via INTRAVENOUS

## 2016-09-16 MED ORDER — MORPHINE SULFATE (PF) 2 MG/ML IV SOLN: 2.0000 mg | INTRAVENOUS | Status: DC | PRN

## 2016-09-16 MED ORDER — HEPARIN (PORCINE) IN NACL 100-0.45 UNIT/ML-% IJ SOLN
800.0000 [IU]/h | INTRAMUSCULAR | Status: DC
  Administered 2016-09-16: 800 [IU]/h via INTRAVENOUS
  Filled 2016-09-16 (×3): qty 250

## 2016-09-16 MED ORDER — LORATADINE 10 MG PO TABS: 10.0000 mg | ORAL_TABLET | Freq: Every day | ORAL | Status: DC | PRN

## 2016-09-16 MED ORDER — SODIUM CHLORIDE 0.9% FLUSH: 3.0000 mL | INTRAVENOUS | Status: DC | PRN

## 2016-09-16 MED ORDER — STROKE: EARLY STAGES OF RECOVERY BOOK
Freq: Once | Status: AC
  Administered 2016-09-17
  Filled 2016-09-16 (×2): qty 1

## 2016-09-16 NOTE — H&P (Signed)
[facility] History and Physical   Patient: Barbara [name] YWV:[id number]   PCP: Jonathon Jordan, MD DOB: 22-Aug-1950   DOA: 09/16/2016   DOS: 09/16/2016   DOS: the patient was seen and examined on 09/16/2016  Patient coming from: The patient is coming from home.  Chief Complaint: chest pain  HPI: Barbara [name] is a 66 y.o. female with Past medical history of Hypertension, dyslipidemia, GERD, trigeminal neuralgia, bilateral scleritis. Patient presents with complaints of substernal chest pain. She started noticing this chest pain earlier during the day when she was walking around the house. She was diaphoretic with chest pain. Senokot intensity, it was pressure-like sensation and radiating to her left jaw as well as neck. No fever no chills no nausea no vomiting. The pain subsided at the time of my evaluation on its own after receiving aspirin. No similar chest pain in the past, no shortness of breath or dyspnea on exertion recently. Patient was recently started on methotrexate as well as 40 mg prednisone 2 weeks ago for bilateral scleritis. Patient also complains about having some numbness of the right cheek ongoing since last afternoon. She states that this is worsening while she is here in the ER. No other focal deficit reported no nausea no vomiting no diarrhea no constipation no dizziness no lightheadedness no burning urination. No other recent change in her medications reported. In December 2017 she had a stress test which was low risk. She had upper GI endoscopy in January, which was normal. Had esophageal manometry as well as pH studies which were also normal. Denies any drug abuse. No alcohol abuse as well.  ED Course: Presented with chest pain, initial EKG was showing evidence of STEMI, code STEMI was called, on call cardiology attending Dr. Tamala Julian reviewed the EKG and felt that the patient does not have STEMI. I recommended cardiology consult. Cardiology attending Dr. Rodolph Bong reviewed  the EKG as well and recommended no acute intervention required, recommended to start the patient on aspirin and if no contraindication heparin. Also was revealed the patient in the ER she started complaining of the right-sided numbness and CT scan of the head was performed which was negative for any acute stroke. Patient was transferred to [facility] for admission  At her baseline ambulates without support And is independent for most of her ADL; manages her medication on her own.  Review of Systems: as mentioned in the history of present illness.  All other systems reviewed and are negative.  Past Medical History:  Diagnosis Date  . Anxiety   . Arthritis    knees  . Cancer ([place])    basal cell- nose  . GERD (gastroesophageal reflux disease)   . Hyperlipidemia   . Hypertension   . Neuromuscular disorder ([place])    benign tremor- takes Propanolol  . PONV (postoperative nausea and vomiting)    "BP DROPS ALSO"  . Trigeminal neuralgia of right side of face    Past Surgical History:  Procedure Laterality Date  . [place] STUDY N/A 04/17/2016   Procedure: [place] STUDY;  Surgeon: Manus Gunning, MD;  Location: [facility];  Service: Gastroenterology;  Laterality: N/A;  . ESOPHAGEAL MANOMETRY N/A 04/17/2016   Procedure: ESOPHAGEAL MANOMETRY (EM);  Surgeon: Manus Gunning, MD;  Location: [facility]OS[facility];  Service: Gastroenterology;  Laterality: N/A;  . fractured femur  05/2012   left - rod -screws placed  . HARDWARE REMOVAL Left 04/16/2013   Procedure: REMOVAL OF HARDWARE OF LEFT KNEE (  DISTAL INTERLOC SCREW);  Surgeon: Gearlean Alf, MD;  Location: [facility] ORS;  Service: Orthopedics;  Laterality: Left;  . KNEE SURGERY     x 3 left / x1 rt knee  . NASAL SINUS SURGERY     2000  . OTHER SURGICAL HISTORY  2014   titanium rod in left femur used for fracture repair  . SHOULDER SURGERY     bilateral shoulders   . WISDOM TOOTH EXTRACTION     Social History:  reports that she has  never smoked. She has never used smokeless tobacco. She reports that she drinks alcohol. She reports that she does not use drugs.  Allergies  Allergen Reactions  . Ace Inhibitors     Pt unsure- nausea or hives  . Amoxicillin Hives  . Celebrex [Celecoxib] Other (See Comments)    Severe stomach cramps  . Enalapril Other (See Comments)    congestion  . Fentanyl Nausea And Vomiting  . Fish Oil Other (See Comments)    Broke out with blisters  . Gabapentin     Pain and swelling   . Lanolin Acid [Lanolin]     rash  . Nickel     rash  . Other Nausea And Vomiting    Narcotic Pain Killers- vomitting, nausea constantly  . Penicillins Hives    Has patient had a PCN reaction causing immediate rash, facial/tongue/throat swelling, SOB or lightheadedness with hypotension: unknown Has patient had a PCN reaction causing severe rash involving mucus membranes or skin necrosis: no Has patient had a PCN reaction that required hospitalization - no, was at MD office Has patient had a PCN reaction occurring within the last 10 years: no If all of the above answers are "NO", then may proceed with Cephalosporin use.   Marland Kitchen Propoxyphene Hcl Nausea Only  . Sulfonamide Derivatives Nausea Only and Other (See Comments)    Stomach cramping  . Synvisc [Hylan G-F 20]     Made knee swell up at injection site     Family History  Problem Relation Age of Onset  . Stomach cancer Mother   . Heart disease Mother 3       First MI early 31s, CABG   . Lung cancer Father        smoked  . Allergies Father   . Heart attack Father 63  . Brain cancer Brother   . Colon cancer Neg Hx   . Esophageal cancer Neg Hx   . Rectal cancer Neg Hx      Prior to Admission medications   Medication Sig Start Date End Date Taking? Authorizing Provider  aspirin EC 81 MG tablet Take 81 mg by mouth at bedtime.    Yes [provider]  carboxymethylcellulose (REFRESH PLUS) 0.5 % SOLN Place 1 drop into both eyes 2 (two) times  daily.    Yes [provider]  folic acid (FOLVITE) 1 MG tablet Take 1 mg by mouth daily. 08/29/16  Yes [provider]  levocetirizine (XYZAL) 5 MG tablet Take 5 mg by mouth daily as needed for allergies.    Yes [provider]  methotrexate (RHEUMATREX) 2.5 MG tablet TAKE 3 TABLETS BY MOUTH ONCE A WEEK. 08/29/16  Yes [provider]  milk thistle 175 MG tablet Take 175 mg by mouth daily.   Yes [provider]  omeprazole (PRILOSEC) 40 MG capsule Take 1 capsule (40 mg total) by mouth 2 (two) times daily. 07/28/16  Yes Tanda Rockers, MD  prednisoLONE  acetate (PRED FORTE) 1 % ophthalmic suspension Place 1 drop into both eyes daily.    Yes [provider]  predniSONE (DELTASONE) 10 MG tablet Take 40 mg by mouth daily.  08/29/16  Yes [provider]  Probiotic Product (ALIGN) 4 MG CAPS Take 1 capsule by mouth daily.   Yes [provider]  propranolol (INDERAL) 10 MG tablet Take 10 mg by mouth 2 (two) times daily.    Yes [provider]  psyllium (HYDROCIL/METAMUCIL) 95 % PACK Take 1 packet by mouth 2 (two) times daily.   Yes [provider]  Red Yeast Rice Extract 300 MG CAPS Take 600 mg by mouth daily.   Yes [provider]  White Petrolatum-Mineral Oil (SYSTANE NIGHTTIME) OINT Apply to eye as needed.   Yes [provider]    Physical Exam: Vitals:   09/16/16 1208 09/16/16 1501 09/16/16 1530  BP: (!) 159/80  134/76  Resp: 10  18  Temp:   97.7 F (36.5 C)  TempSrc:   Oral  SpO2:   95%  Weight:  84.8 kg (186 lb 14.4 oz) 84.8 kg (186 lb 14.4 oz)  Height:  5\' 2"  (1.575 m) 5\' 2"  (1.575 m)    General: Alert, Awake and Oriented to Time, Place and Person. Appear in mild distress, affect appropriate Eyes: PERRL, Conjunctiva normal ENT: Oral Mucosa clear moist. Neck: no JVD, no Abnormal Mass Or lumps Cardiovascular: S1 and S2 Present, no Murmur, Peripheral Pulses Present Respiratory: normal  respiratory effort, Bilateral Air entry equal and Decreased, no use of accessory muscle, Clear to Auscultation, no Crackles, no wheezes Abdomen: Bowel Sound present, Soft and no tenderness, no hernia Skin: no redness, no Rash, no induration Extremities: no Pedal edema, no calf tenderness Neurologic: Mental status AAOx3, speech normal, attention normal,  Cranial Nerves PERRL, EOM normal and present, facial sensation to light touch present, mild subjective decreased sensation on the right side on cheek.  Motor strength bilateral equal strength 5/5,  Sensation present to light touch,  Reflexes present knee and biceps, babinski negative,  Cerebellar test normal finger nose finger. Gait not checked due to patient safety concerns.     Labs on Admission:  CBC:  Recent Labs Lab 09/16/16 1215  WBC 14.1*  NEUTROABS 7.9*  HGB 13.2  HCT 39.9  MCV 89.1  PLT 161   Basic Metabolic Panel:  Recent Labs Lab 09/16/16 1215  NA 140  K 3.6  CL 106  CO2 26  GLUCOSE 130*  BUN 17  CREATININE 0.83  CALCIUM 8.5*   GFR: Estimated Creatinine Clearance: 67.4 mL/min (by C-G formula based on SCr of 0.83 mg/dL). Liver Function Tests:  Recent Labs Lab 09/16/16 1215  AST 18  ALT 19  ALKPHOS 56  BILITOT 1.1  PROT 6.5  ALBUMIN 3.6   No results for input(s): LIPASE, AMYLASE in the last 168 hours. No results for input(s): AMMONIA in the last 168 hours. Coagulation Profile: No results for input(s): INR, PROTIME in the last 168 hours. Cardiac Enzymes:  Recent Labs Lab 09/16/16 1358  CKTOTAL 24*  CKMB 1.7  TROPONINI <0.03   BNP (last 3 results) No results for input(s): PROBNP in the last 8760 hours. HbA1C: No results for input(s): HGBA1C in the last 72 hours. CBG: No results for input(s): GLUCAP in the last 168 hours. Lipid Profile: No results for input(s): CHOL, HDL, LDLCALC, TRIG, CHOLHDL, LDLDIRECT in the last 72 hours. Thyroid Function Tests: No results for input(s): TSH,  T4TOTAL,  FREET4, T3FREE, THYROIDAB in the last 72 hours. Anemia Panel: No results for input(s): VITAMINB12, FOLATE, FERRITIN, TIBC, IRON, RETICCTPCT in the last 72 hours. Urine analysis: No results found for: COLORURINE, APPEARANCEUR, [place], [place], GLUCOSEU, HGBUR, BILIRUBINUR, KETONESUR, PROTEINUR, UROBILINOGEN, NITRITE, LEUKOCYTESUR  Radiological Exams on Admission: Ct Head Wo Contrast  Result Date: 09/16/2016 CLINICAL DATA:  66 y/o  F; right facial numbness. EXAM: CT HEAD WITHOUT CONTRAST TECHNIQUE: Contiguous axial images were obtained from the base of the skull through the vertex without intravenous contrast. COMPARISON:  None. FINDINGS: Brain: No evidence of acute infarction, hemorrhage, hydrocephalus, extra-axial collection or mass lesion/mass effect. Few nonspecific foci of hypoattenuation in subcortical white matter compatible with mild chronic microvascular ischemic changes. Vascular: Mild calcific atherosclerosis of carotid siphons. No hyperdense vessel identified. Skull: Normal. Negative for fracture or focal lesion. Sinuses/Orbits: No acute finding. Other: None. IMPRESSION: 1. No acute intracranial abnormality identified. 2. Mild chronic microvascular ischemic changes of the brain. Electronically Signed   By: Kristine Garbe M.D.   On: 09/16/2016 14:32   Dg Chest Port 1 View  Result Date: 09/16/2016 CLINICAL DATA:  Midsternal chest pain EXAM: PORTABLE CHEST 1 VIEW COMPARISON:  02/24/2016 FINDINGS: 1206 hours. The lungs are clear without focal pneumonia, edema, pneumothorax or pleural effusion. The cardiopericardial silhouette is within normal limits for size. The visualized bony structures of the thorax are intact. Telemetry leads overlie the chest. IMPRESSION: No active disease. Electronically Signed   By: Misty Stanley M.D.   On: 09/16/2016 12:28   EKG: Independently reviewed. normal sinus rhythm, ST elevation in 23 aVF as well as V5.  Assessment/Plan 1. Chest pain ST  elevation in 23 aVF as well as V5. Cardiology consulted, course and was initially called but cardiology consulted. Initial troponin negative, CK-MB negative. Myoglobin currently pending. Patient started on aspirin, avoid nitroglycerin with concern for inferior STEMI. We'll start the patient on IV heparin since she does not have any contraindication. Pelvic cartilage recommendation at present. Monitor serial troponin as well as echocardiogram.  2. Right facial numbness. Represent in the right cheek area. CT scan of the head unremarkable. Patient mentions that she has history of trigeminal neuralgia and this feels like the same but she does not have any pain at the cheek. Discussed with neurology on call, recommended MRI brain with and without contrast with a comment on facial nerve and if that is negative no further workup required. Monitor serial neuro checks at present.  3. History of GERD. Recent use of prednisone. Continue Protonix at present. Patient mentions her GERD is well controlled.  4. Bilateral scleritis. Patient was recently diagnosed with autoimmune bilateral scleritis and has been following up with immunology for the same. Started on methotrexate as well as prednisone. Currently I would continue prednisone holding methotrexate. Reportedly patient's neurologist has requested her to reduce her methotrexate dose yesterday.  5. Essential tremor. Patient is on Inderal at home. Cardiology requesting to continue at present  Nutrition: full code DVT Prophylaxis: on therapeutic anticoagulation.  Advance goals of care discussion: full code   Consults: cardiology   Family Communication: family was present at bedside, at the time of interview.  Opportunity was given to ask question and all questions were answered satisfactorily.  Disposition: Admitted as observation, step-down unit. Likely to be discharged home, in 2-3 days.  Author: Berle Mull, MD [facility] Pager: [phone] 09/16/2016  If 7PM-7AM, please contact night-coverage [email or web address] Password [email or web address]

## 2016-09-16 NOTE — Progress Notes (Signed)
    Patient with chest pain.  Initial EKG with inferior ST changes.  However, patient is pain free and initial enzymes negative.  ED MD reviewed with Dr. Tamala Julian.  No code STEMI called.  OK to admit to [facility].   Needs ASA and if no contraindication heparin.  I will see her in consultation.

## 2016-09-16 NOTE — Progress Notes (Signed)
Called by the RN caring for this patient with several concerns about the patient's care. Specifically, there were concerns that this patient was never evaluated by a hospitalist or neurologist despite an MRI order for evaluation of facial numbness, concerned that a neurologist or hospitalist has not documented an NIH score, and concerned that none of the patient's home medications have been ordered. There is also concern that the patient was admitted with a STEMI and does not have cardiac enzyme orders.  Chart was reviewed in detail. Patient was evaluated by the hospitalist at [facility] prior to her transfer here to [facility], as documented in a very detailed note that describes the admitting physician's discussion with neurology and cardiology. A full neurologic exam is described in detail within this note.   Cardiologist has already evaluated the patient, also documented with a detailed consult note in the chart  that describes the recommendations and plan, and explains why this is not a STEMI.   Patient's home medication list was reviewed in the chart, and it is noted that these medications had already been continued by the admitting physician.   Initial troponin was undetectable and the patient is no longer having pain. She is on a heparin infusion. Orders for further troponin monitoring are not seen in the chart and have been ordered. Nurses encouraged to call back with any concerns regarding the patient's care or safety.

## 2016-09-16 NOTE — Progress Notes (Signed)
ANTICOAGULATION CONSULT NOTE - Initial Consult  Pharmacy Consult for Heparin Indication: chest pain/ACS  Patient Measurements: Height: 5\' 2"  (157.5 cm) Weight: 186 lb 14.4 oz (84.8 kg) IBW/kg (Calculated) : 50.1 Heparin Dosing Weight: 67 kg  Vital Signs: Temp: 98.1 F (36.7 C) (07/07 1957) Temp Source: Oral (07/07 1957) BP: 113/73 (07/07 1900) Pulse Rate: 80 (07/07 1957)  Labs:  Recent Labs  09/16/16 1215 09/16/16 1358 09/16/16 2021 09/16/16 2243  HGB 13.2  --   --   --   HCT 39.9  --   --   --   PLT 298  --   --   --   APTT  --   --  59*  --   LABPROT  --   --  14.1  --   INR  --   --  1.09  --   HEPARINUNFRC  --   --   --  0.44  CREATININE 0.83  --   --   --   CKTOTAL  --  24*  --   --   CKMB  --  1.7  --   --   TROPONINI  --  <0.03 <0.03  --     Estimated Creatinine Clearance: 67.4 mL/min (by C-G formula based on SCr of 0.83 mg/dL).   Medical History: Past Medical History:  Diagnosis Date  . Anxiety   . Arthritis    knees  . Cancer ([place])    basal cell- nose  . GERD (gastroesophageal reflux disease)   . Hyperlipidemia   . Hypertension   . Neuromuscular disorder ([place])    benign tremor- takes Propanolol  . PONV (postoperative nausea and vomiting)    "BP DROPS ALSO"  . Trigeminal neuralgia of right side of face    Assessment: 66 yr female with chief complaint of sudden onset of chest pain. Pharmacy consulted to dose heparin. No oral anticoagulation PTA. Heparin level therapeutic at 0.44. CBC stable with no overt s/s bleeding noted. Plans for cath Monday per Cardiology.   Goal of Therapy:  Heparin level 0.3-0.7 units/ml Monitor platelets by anticoagulation protocol: Yes   Plan:  Continue heparin gtt at 800 units/hr Confirmatory heparin level with AM labs  Daily heparin level and CBC Monitor for s/s bleeding F/u cardiology plans  Argie Ramming, PharmD Clinical Pharmacist 09/16/16 11:51 PM

## 2016-09-16 NOTE — ED Notes (Signed)
Patient blood sent to lab.  Lab unable to find blood.  Phlebotomist requested to draw blood.  PA-C notified of delay

## 2016-09-16 NOTE — ED Notes (Signed)
EKG given to DR. Yao, pt transferred back to be assessed.

## 2016-09-16 NOTE — Progress Notes (Signed)
ANTICOAGULATION CONSULT NOTE - Initial Consult  Pharmacy Consult for Heparin Indication: chest pain/ACS  Allergies  Allergen Reactions  . Ace Inhibitors     Pt unsure- nausea or hives  . Amoxicillin Hives  . Celebrex [Celecoxib] Other (See Comments)    Severe stomach cramps  . Enalapril Other (See Comments)    congestion  . Fentanyl Nausea And Vomiting  . Fish Oil Other (See Comments)    Broke out with blisters  . Gabapentin     Pain and swelling   . Lanolin Acid [Lanolin]     rash  . Nickel     rash  . Other Nausea And Vomiting    Narcotic Pain Killers- vomitting, nausea constantly  . Penicillins Hives    Has patient had a PCN reaction causing immediate rash, facial/tongue/throat swelling, SOB or lightheadedness with hypotension: unknown Has patient had a PCN reaction causing severe rash involving mucus membranes or skin necrosis: no Has patient had a PCN reaction that required hospitalization - no, was at MD office Has patient had a PCN reaction occurring within the last 10 years: no If all of the above answers are "NO", then may proceed with Cephalosporin use.   Marland Kitchen Propoxyphene Hcl Nausea Only  . Sulfonamide Derivatives Nausea Only and Other (See Comments)    Stomach cramping  . Synvisc [Hylan G-F 20]     Made knee swell up at injection site    Patient Measurements: Weight: 160 lb (72.6 kg) Heparin Dosing Weight: 67 kg  Vital Signs: BP: 159/80 (07/07 1208)  Labs:  Recent Labs  09/16/16 1215 09/16/16 1358  HGB 13.2  --   HCT 39.9  --   PLT 298  --   CREATININE 0.83  --   TROPONINI  --  <0.03    Estimated Creatinine Clearance: 63.7 mL/min (by C-G formula based on SCr of 0.83 mg/dL).   Medical History: Past Medical History:  Diagnosis Date  . Allergy   . Anxiety   . Arthritis    knees  . Cancer ([place])    basal cell- nose  . Cough variant asthma vs UACS    . GERD (gastroesophageal reflux disease)   . History of skin cancer   . Hyperlipidemia   .  Hypertension   . Knee pain, left   . Neuromuscular disorder ([place])    benign tremor- takes Propanolol  . Occasional tremors   . PONV (postoperative nausea and vomiting)    "BP DROPS ALSO"  . Trigeminal neuralgia of right side of face     Assessment:  66 yr female with chief complaint of sudden onset of chest pain.  PMH significant for hyperlipidemia, HTN  Initial EKG shows possible inferior STEMI.  Troponin negative.  Plan to admit to [place] consulted to dose IV heparin   Patient on no oral anticoagulation PTA  Goal of Therapy:  Heparin level 0.3-0.7 units/ml Monitor platelets by anticoagulation protocol: Yes   Plan:   Obtain baseline PT/INR and aPTT  Heparin 4000 unit IV bolus x 1 then heparin gtt @ 800 units/hr  Check heparin level 6 hr after heparin started  Follow heparin level & CBC daily   Everette Rank, PharmD 09/16/2016,3:04 PM

## 2016-09-16 NOTE — ED Triage Notes (Signed)
Pt arrives with husband, sudden onset of chest pain while doing the laundry this morning.

## 2016-09-16 NOTE — ED Provider Notes (Signed)
[place] DEPT Provider Note   CSN: [id number] Arrival date & time: 09/16/16  1151     History   Chief Complaint Chief Complaint  Patient presents with  . Chest Pain    HPI Barbara [name] is a 66 y.o. female history of hyperlipidemia, hypertension, autoimmune disorder on methotrexate, prednisone 40 mg daily, here with chest pain. Patient states that she has acute onset of substernal chest pain that radiated to then neck and left arm started around 30 minutes prior to arrival. She was doing laundry at that time and felt chest pressure and neck pain. There is some associated shortness of breath but denies any purulent chest pain. Patient was admitted several months ago for chest pain and had an unremarkable stress test. She never had an angiogram before. Currently pain is 6 out of 10.  The history is provided by the patient.    Past Medical History:  Diagnosis Date  . Allergy   . Anxiety   . Arthritis    knees  . Cancer ([place])    basal cell- nose  . GERD (gastroesophageal reflux disease)   . History of skin cancer   . Hyperlipidemia   . Hypertension   . Knee pain, left   . Neuromuscular disorder ([place])    benign tremor- takes Propanolol  . Occasional tremors   . PONV (postoperative nausea and vomiting)    "BP DROPS ALSO"  . Trigeminal neuralgia of right side of face     Patient Active Problem List   Diagnosis Date Noted  . Morbid obesity due to excess calories ([place]) 06/01/2016  . Dyspnea on exertion 05/13/2016  . Hoarseness 03/17/2016  . Gastroesophageal reflux disease without esophagitis 03/17/2016  . Globus sensation 03/17/2016  . Gastritis 02/25/2016  . Atypical chest pain 02/24/2016  . Painful orthopaedic hardware ([place]) 04/15/2013  . VITAMIN D DEFICIENCY 12/09/2009  . HYPERCHOLESTEROLEMIA 12/09/2009  . Essential hypertension 12/09/2009  . Allergic rhinitis 12/09/2009  . NEPHROLITHIASIS 12/09/2009  . CERVICAL POLYP 12/09/2009  . OSTEOARTHRITIS 12/09/2009    . Cough variant asthma vs UACS  12/09/2009    Past Surgical History:  Procedure Laterality Date  . [place] STUDY N/A 04/17/2016   Procedure: [place] STUDY;  Surgeon: Manus Gunning, MD;  Location: [facility];  Service: Gastroenterology;  Laterality: N/A;  . ESOPHAGEAL MANOMETRY N/A 04/17/2016   Procedure: ESOPHAGEAL MANOMETRY (EM);  Surgeon: Manus Gunning, MD;  Location: [facility];  Service: Gastroenterology;  Laterality: N/A;  . fractured femur  05/2012   left - rod -screws placed  . HARDWARE REMOVAL Left 04/16/2013   Procedure: REMOVAL OF HARDWARE OF LEFT KNEE (DISTAL INTERLOC SCREW);  Surgeon: Gearlean Alf, MD;  Location: [facility];  Service: Orthopedics;  Laterality: Left;  . KNEE SURGERY     x 3 left / x1 rt knee  . NASAL SINUS SURGERY     2000  . OTHER SURGICAL HISTORY  2014   titanium rod in left femur used for fracture repair  . SHOULDER SURGERY     bilateral shoulders   . WISDOM TOOTH EXTRACTION      OB History    No data available       Home Medications    Prior to Admission medications   Medication Sig Start Date End Date Taking? Authorizing Provider  aspirin EC 81 MG tablet Take 81 mg by mouth at bedtime.     [provider]  carboxymethylcellulose (REFRESH PLUS) 0.5 % SOLN  Place 1 drop into both eyes 2 (two) times daily.     [provider]  dextromethorphan (DELSYM) 30 MG/5ML liquid 2 tsp by mouth every 12 hours as needed for cough    [provider]  Hypertonic Nasal Wash (SINUS RINSE BOTTLE KIT NA) Use as needed    [provider]  levocetirizine (XYZAL) 5 MG tablet Take 5 mg by mouth every evening.    [provider]  omeprazole (PRILOSEC) 40 MG capsule Take 1 capsule (40 mg total) by mouth 2 (two) times daily. 07/28/16   Tanda Rockers, MD  prednisoLONE acetate (PRED FORTE) 1 % ophthalmic suspension Place 1 drop into both eyes daily.     [provider]  Probiotic Product (ALIGN) 4  MG CAPS Take 1 capsule by mouth daily.    [provider]  propranolol (INDERAL) 10 MG tablet Take 10 mg by mouth 2 (two) times daily.     [provider]  White Petrolatum-Mineral Oil (SYSTANE NIGHTTIME) OINT Apply to eye as needed.    [provider]    Family History Family History  Problem Relation Age of Onset  . Stomach cancer Mother   . Heart disease Mother   . Lung cancer Father        smoked  . Allergies Father   . Brain cancer Brother   . Colon cancer Neg Hx   . Esophageal cancer Neg Hx   . Rectal cancer Neg Hx     Social History Social History  Substance Use Topics  . Smoking status: Never Smoker  . Smokeless tobacco: Never Used  . Alcohol use Yes     Comment: RARE     Allergies   Ace inhibitors; Amoxicillin; Celebrex [celecoxib]; Enalapril; Fentanyl; Fish oil; Gabapentin; Lanolin acid [lanolin]; Nickel; Other; Penicillins; Propoxyphene hcl; Sulfonamide derivatives; and Synvisc [hylan g-f 20]   Review of Systems Review of Systems  Cardiovascular: Positive for chest pain.  All other systems reviewed and are negative.    Physical Exam Updated Vital Signs There were no vitals taken for this visit.  Physical Exam  Constitutional: She is oriented to person, place, and time.  Slightly uncomfortable   HENT:  Head: Normocephalic.  Mouth/Throat: Oropharynx is clear and moist.  Eyes: Conjunctivae and EOM are normal. Pupils are equal, round, and reactive to light.  Neck: Normal range of motion. Neck supple.  Cardiovascular: Normal rate, regular rhythm and normal heart sounds.   Pulmonary/Chest: Effort normal and breath sounds normal. No respiratory distress. She has no wheezes. She has no rales.  No reproducible tenderness   Abdominal: Soft. Bowel sounds are normal. She exhibits no distension. There is no tenderness.  Musculoskeletal: Normal range of motion. She exhibits no edema.  Neurological: She is alert and oriented to person,  place, and time. No cranial nerve deficit. Coordination normal.  Skin: Skin is warm.  Psychiatric: She has a normal mood and affect.  Nursing note and vitals reviewed.    ED Treatments / Results  Labs (all labs ordered are listed, but only abnormal results are displayed) Labs Reviewed  CBC WITH DIFFERENTIAL/PLATELET  COMPREHENSIVE METABOLIC PANEL  I-STAT TROPOININ, ED    EKG  EKG Interpretation  Date/Time:  Saturday September 16 2016 12:03:52 EDT Ventricular Rate:  66 PR Interval:    QRS Duration: 95 QT Interval:  372 QTC Calculation: 390 R Axis:   63 Text Interpretation:  Sinus rhythm Minimal ST elevation, inferior leads STEMI improved. Dr. Tamala Julian thought no  STEMI currently.  Confirmed by Wandra Arthurs [phone]) on 09/16/2016 12:24:56 PM       Radiology No results found.  Procedures Procedures (including critical care time)  Medications Ordered in ED Medications  nitroGLYCERIN (NITROSTAT) SL tablet 0.4 mg (not administered)  aspirin tablet 325 mg (325 mg Oral Given 09/16/16 1222)     Initial Impression / Assessment and Plan / ED Course  I have reviewed the triage vital signs and the nursing notes.  Pertinent labs & imaging results that were available during my care of the patient were reviewed by me and considered in my medical decision making (see chart for details).     Barbara [name] is a 66 y.o. female here with chest pain. Initial EKG showed possible inferior STEMI with no reciprocal changes. I talked to Dr. Tamala Julian, STEMI doc, around 12:10 pm. Repeat EKG showed no STEMI. He recommend getting trop and if trop neg, will get cardiology consult and if positive, will cath emergently. Will hold off on activating code STEMI as second EKG showed no STEMI.   12:33 PM Trop neg x 1. I updated Dr. Tamala Julian. He recommend cardiology consult.   1:01 PM I called Dr. Percival Spanish from cardiology. Since she is pain free, he recommend hospitalist admission, transfer to [facility] and he will see  patient at [facility]. Hold off on heparin and nitro since pain free currently.    Final Clinical Impressions(s) / ED Diagnoses   Final diagnoses:  None    New Prescriptions New Prescriptions   No medications on file     Drenda Freeze, MD 09/16/16 1302

## 2016-09-16 NOTE — Consult Note (Signed)
CARDIOLOGY CONSULT NOTE  Patient ID: Barbara [name] MRN: [id number] DOB/AGE: 1950-12-31 66 y.o.  Admit date: 09/16/2016 Primary Physician Jonathon Jordan, MD Primary Cardiologist New Chief Complaint  Chest pain Requesting  Dr. Darl Householder  HPI:  The patient presents with chest pain.   She noticed last night that her right jaw was tingling but she blamed this on the trigeminal neuralgia.  This morning at rest she developed a mid chest pain that was sharp and piercing.  She alternately reports some heaviness as well.  She presented to [facility] ED.  The initial EKG suggested some mild inferior ST elevation.  However, this did not meet criteria for an acute ST elevation pattern.  Her pain resolved.  Enzymes were negative.  Subsequent EKGs are more consistent with repolarization changes.  She has had no further symptoms.  She has not had these symptoms before.  She is limited by some eye problems and has been seeing a rheumatologist.  She is being treated with steroids.  She reports that the pain today hurt with taking a deep breath. She felt cold.  It was 8/10 in intensity.  She is active climbing stairs and has not been getting this pain recently.    Of note she did see Dr. Harrington Challenger last year and had an evaluation for chest pain.  She had no ischemic changes on perfusion imaging.  She had a normal echo.   Past Medical History:  Diagnosis Date  . Allergy   . Anxiety   . Arthritis    knees  . Cancer ([place])    basal cell- nose  . Cough variant asthma vs UACS    . GERD (gastroesophageal reflux disease)   . History of skin cancer   . Hyperlipidemia   . Hypertension   . Knee pain, left   . Neuromuscular disorder ([place])    benign tremor- takes Propanolol  . Occasional tremors   . PONV (postoperative nausea and vomiting)    "BP DROPS ALSO"  . Trigeminal neuralgia of right side of face     Past Surgical History:  Procedure Laterality Date  . [place] STUDY N/A 04/17/2016   Procedure: [place] STUDY;   Surgeon: Manus Gunning, MD;  [facility];  Service: Gastroenterology;  Laterality: N/A;  . ESOPHAGEAL MANOMETRY N/A 04/17/2016   Procedure: ESOPHAGEAL MANOMETRY (EM);  Surgeon: Manus Gunning, MD;  [facility];  Service: Gastroenterology;  Laterality: N/A;  . fractured femur  05/2012   left - rod -screws placed  . HARDWARE REMOVAL Left 04/16/2013   Procedure: REMOVAL OF HARDWARE OF LEFT KNEE (DISTAL INTERLOC SCREW);  Surgeon: Gearlean Alf, MD;  [facility];  Service: Orthopedics;  Laterality: Left;  . KNEE SURGERY     x 3 left / x1 rt knee  . NASAL SINUS SURGERY     2000  . OTHER SURGICAL HISTORY  2014   titanium rod in left femur used for fracture repair  . SHOULDER SURGERY     bilateral shoulders   . WISDOM TOOTH EXTRACTION      Allergies  Allergen Reactions  . Ace Inhibitors     Pt unsure- nausea or hives  . Amoxicillin Hives  . Celebrex [Celecoxib] Other (See Comments)    Severe stomach cramps  . Enalapril Other (See Comments)    congestion  . Fentanyl Nausea And Vomiting  . Fish Oil Other (See Comments)    Broke out with blisters  . Gabapentin  Pain and swelling   . Lanolin Acid [Lanolin]     rash  . Nickel     rash  . Other Nausea And Vomiting    Narcotic Pain Killers- vomitting, nausea constantly  . Penicillins Hives    Has patient had a PCN reaction causing immediate rash, facial/tongue/throat swelling, SOB or lightheadedness with hypotension: unknown Has patient had a PCN reaction causing severe rash involving mucus membranes or skin necrosis: no Has patient had a PCN reaction that required hospitalization - no, was at MD office Has patient had a PCN reaction occurring within the last 10 years: no If all of the above answers are "NO", then may proceed with Cephalosporin use.   Marland Kitchen Propoxyphene Hcl Nausea Only  . Sulfonamide Derivatives Nausea Only and Other (See Comments)    Stomach cramping  . Synvisc [Hylan G-F 20]       Made knee swell up at injection site   Facility-Administered Medications Prior to Admission  Medication Dose Route Frequency Provider Last Rate Last Dose  . 0.9 %  sodium chloride infusion  500 mL Intravenous Continuous Armbruster, Renelda Loma, MD       Prescriptions Prior to Admission  Medication Sig Dispense Refill Last Dose  . aspirin EC 81 MG tablet Take 81 mg by mouth at bedtime.    Past Month at Unknown time  . carboxymethylcellulose (REFRESH PLUS) 0.5 % SOLN Place 1 drop into both eyes 2 (two) times daily.    09/16/2016 at Unknown time  . folic acid (FOLVITE) 1 MG tablet Take 1 mg by mouth daily.  3 09/16/2016 at Unknown time  . levocetirizine (XYZAL) 5 MG tablet Take 5 mg by mouth daily as needed for allergies.    09/15/2016 at Unknown time  . methotrexate (RHEUMATREX) 2.5 MG tablet TAKE 3 TABLETS BY MOUTH ONCE A WEEK.  3 09/12/2016  . milk thistle 175 MG tablet Take 175 mg by mouth daily.   09/16/2016 at Unknown time  . omeprazole (PRILOSEC) 40 MG capsule Take 1 capsule (40 mg total) by mouth 2 (two) times daily.   09/15/2016 at Unknown time  . prednisoLONE acetate (PRED FORTE) 1 % ophthalmic suspension Place 1 drop into both eyes daily.    Past Week at Unknown time  . predniSONE (DELTASONE) 10 MG tablet Take 40 mg by mouth daily.    09/15/2016 at Unknown time  . Probiotic Product (ALIGN) 4 MG CAPS Take 1 capsule by mouth daily.   09/16/2016 at Unknown time  . propranolol (INDERAL) 10 MG tablet Take 10 mg by mouth 2 (two) times daily.    09/16/2016 at 0930  . psyllium (HYDROCIL/METAMUCIL) 95 % PACK Take 1 packet by mouth 2 (two) times daily.   09/15/2016 at Unknown time  . Red Yeast Rice Extract 300 MG CAPS Take 600 mg by mouth daily.   09/16/2016 at Unknown time  . White Petrolatum-Mineral Oil (SYSTANE NIGHTTIME) OINT Apply to eye as needed.   Past Month at Unknown time   Family History  Problem Relation Age of Onset  . Stomach cancer Mother   . Heart disease Mother   . Lung cancer Father         smoked  . Allergies Father   . Brain cancer Brother   . Colon cancer Neg Hx   . Esophageal cancer Neg Hx   . Rectal cancer Neg Hx     Social History   Social History  . Marital status: Married    Spouse  name: N/A  . Number of children: 2  . Years of education: N/A   Occupational History  . retired    Social History Main Topics  . Smoking status: Never Smoker  . Smokeless tobacco: Never Used  . Alcohol use Yes     Comment: RARE  . Drug use: No  . Sexual activity: Not on file   Other Topics Concern  . Not on file   Social History Narrative  . No narrative on file     ROS:    As stated in the HPI and negative for all other systems.  Physical Exam: Blood pressure 134/76, temperature 97.7 F (36.5 C), temperature source Oral, resp. rate 18, height 5\' 2"  (1.575 m), weight 160 lb (72.6 kg), SpO2 95 %.  GENERAL:  Well appearing HEENT:  Pupils equal round and reactive, fundi not visualized, oral mucosa unremarkable NECK:  No jugular venous distention, waveform within normal limits, carotid upstroke brisk and symmetric, no bruits, no thyromegaly LYMPHATICS:  No cervical, inguinal adenopathy LUNGS:  Clear to auscultation bilaterally BACK:  No CVA tenderness CHEST:  Unremarkable HEART:  PMI not displaced or sustained,S1 and S2 within normal limits, no S3, no S4, no clicks, no rubs, no murmurs ABD:  Flat, positive bowel sounds normal in frequency in pitch, no bruits, no rebound, no guarding, no midline pulsatile mass, no hepatomegaly, no splenomegaly EXT:  2 plus pulses throughout, no edema, no cyanosis no clubbing SKIN:  No rashes no nodules NEURO:  Cranial nerves II through XII grossly intact, motor grossly intact throughout PSYCH:  Cognitively intact, oriented to person place and time   Labs: Lab Results  Component Value Date   BUN 17 09/16/2016   Lab Results  Component Value Date   CREATININE 0.83 09/16/2016   Lab Results  Component Value Date   NA 140  09/16/2016   K 3.6 09/16/2016   CL 106 09/16/2016   CO2 26 09/16/2016   Lab Results  Component Value Date   TROPONINI <0.03 09/16/2016   Lab Results  Component Value Date   WBC 14.1 (H) 09/16/2016   HGB 13.2 09/16/2016   HCT 39.9 09/16/2016   MCV 89.1 09/16/2016   PLT 298 09/16/2016   Lab Results  Component Value Date   CHOL 169 02/25/2016   HDL 39 (L) 02/25/2016   LDLCALC 100 (H) 02/25/2016   TRIG 151 (H) 02/25/2016   CHOLHDL 4.3 02/25/2016   Lab Results  Component Value Date   ALT 19 09/16/2016   AST 18 09/16/2016   ALKPHOS 56 09/16/2016   BILITOT 1.1 09/16/2016      Radiology:   CXR: 1206 hours. The lungs are clear without focal pneumonia, edema, pneumothorax or pleural effusion. The cardiopericardial silhouette is within normal limits for size. The visualized bony structures of the thorax are intact. Telemetry leads overlie the chest.  EKG:  NSR, rate 63, axis WNL, intervals WNL, no acute ST T wave changes.  Repolarization changes.  09/16/2016  ASSESSMENT AND PLAN:   CHEST PAIN :  This is somewhat atypical.  There were questionable dynamic EKG changes however and she has had recurrent symptoms.   She is admitted and will continue to have enzymes cycled.  She will be treated with heparin and IV NTG.  I will plan cath on Monday.  The patient understands that risks included but are not limited to stroke (1 in 1000), death (1 in 61), kidney failure [usually temporary] (1 in 500), bleeding (1 in 200),  allergic reaction [possibly serious] (1 in 200).  The patient understands and agrees to proceed.   HTN:  Resume beta blocker and titrate as necessary. We will consider Norvasc or ACE inhibitor as needed if BP remains elevated.   SignedMinus Breeding 09/16/2016, 3:48 PM

## 2016-09-17 ENCOUNTER — Observation Stay (HOSPITAL_BASED_OUTPATIENT_CLINIC_OR_DEPARTMENT_OTHER): Payer: Medicare Other

## 2016-09-17 DIAGNOSIS — H5340 Unspecified visual field defects: Secondary | ICD-10-CM | POA: Diagnosis not present

## 2016-09-17 DIAGNOSIS — R072 Precordial pain: Secondary | ICD-10-CM | POA: Diagnosis not present

## 2016-09-17 DIAGNOSIS — R200 Anesthesia of skin: Secondary | ICD-10-CM

## 2016-09-17 DIAGNOSIS — H15003 Unspecified scleritis, bilateral: Secondary | ICD-10-CM | POA: Diagnosis not present

## 2016-09-17 DIAGNOSIS — K219 Gastro-esophageal reflux disease without esophagitis: Secondary | ICD-10-CM | POA: Diagnosis not present

## 2016-09-17 DIAGNOSIS — I208 Other forms of angina pectoris: Secondary | ICD-10-CM | POA: Diagnosis not present

## 2016-09-17 LAB — BASIC METABOLIC PANEL
Anion gap: 6 (ref 5–15)
BUN: 14 mg/dL (ref 6–20)
CO2: 27 mmol/L (ref 22–32)
Calcium: 8.4 mg/dL — ABNORMAL LOW (ref 8.9–10.3)
Chloride: 107 mmol/L (ref 101–111)
Creatinine, Ser: 0.86 mg/dL (ref 0.44–1.00)
GFR calc Af Amer: >60 mL/min (ref >60)
GFR calc non Af Amer: >60 mL/min (ref >60)
Glucose, Bld: 93 mg/dL (ref 65–99)
Potassium: 3.9 mmol/L (ref 3.5–5.1)
Sodium: 140 mmol/L (ref 135–145)

## 2016-09-17 LAB — LIPID PANEL
Cholesterol: 184 mg/dL (ref 0–200)
HDL: 44 mg/dL (ref >40)
LDL Cholesterol: 116 mg/dL — ABNORMAL HIGH (ref 0–99)
Total CHOL/HDL Ratio: 4.2 RATIO
Triglycerides: 119 mg/dL (ref <150)
VLDL: 24 mg/dL (ref 0–40)

## 2016-09-17 LAB — ECHOCARDIOGRAM COMPLETE
E decel time: 204 msec
E/e' ratio: 8.96
FS: 44 % (ref 28–44)
Height: 62 in
IVS/LV PW RATIO, ED: 0.8
LA ID, A-P, ES: 34 mm
LA diam end sys: 34 mm
LA diam index: 1.83 cm/m2
LA vol A4C: 44.6 ml
LA vol index: 19.3 mL/m2
LA vol: 35.9 mL
LV E/e' medial: 8.96
LV E/e'average: 8.96
LV PW d: 10 mm — AB (ref 0.6–1.1)
LV e' LATERAL: 7.63 cm/s
LVOT SV: 72 mL
LVOT VTI: 25.5 cm
LVOT area: 2.84 cm2
LVOT diameter: 19 mm
LVOT peak grad rest: 5 mmHg
LVOT peak vel: 112 cm/s
Lateral S' vel: 8.45 cm/s
MV Dec: 204
MV pk A vel: 74.4 m/s
MV pk E vel: 68.4 m/s
RV sys press: 23 mmHg
Reg peak vel: 223 cm/s
TAPSE: 26.9 mm
TDI e' lateral: 7.63
TDI e' medial: 6.39
TR max vel: 223 cm/s
Weight: 2990.4 oz

## 2016-09-17 LAB — TROPONIN I
Troponin I: <0.03 ng/mL (ref <0.03)
Troponin I: <0.03 ng/mL (ref <0.03)

## 2016-09-17 LAB — CBC
HCT: 39.9 % (ref 36.0–46.0)
Hemoglobin: 12.5 g/dL (ref 12.0–15.0)
MCH: 28.9 pg (ref 26.0–34.0)
MCHC: 31.3 g/dL (ref 30.0–36.0)
MCV: 92.4 fL (ref 78.0–100.0)
Platelets: 260 10*3/uL (ref 150–400)
RBC: 4.32 MIL/uL (ref 3.87–5.11)
RDW: 14.3 % (ref 11.5–15.5)
WBC: 9.3 10*3/uL (ref 4.0–10.5)

## 2016-09-17 LAB — MAGNESIUM: Magnesium: 2.2 mg/dL (ref 1.7–2.4)

## 2016-09-17 LAB — C-REACTIVE PROTEIN: CRP: <0.8 mg/dL (ref <1.0)

## 2016-09-17 LAB — D-DIMER, QUANTITATIVE (NOT AT ARMC): D-Dimer, Quant: 0.36 ug/mL-FEU (ref 0.00–0.50)

## 2016-09-17 LAB — HEPARIN LEVEL (UNFRACTIONATED): Heparin Unfractionated: 0.39 IU/mL (ref 0.30–0.70)

## 2016-09-17 LAB — SEDIMENTATION RATE: Sed Rate: 4 mm/hr (ref 0–22)

## 2016-09-17 LAB — MYOGLOBIN, SERUM: Myoglobin: 22 ng/mL — ABNORMAL LOW (ref 25–58)

## 2016-09-17 NOTE — Progress Notes (Signed)
ANTICOAGULATION CONSULT NOTE - Follow Up Consult  Pharmacy Consult for Heparin Indication: chest pain/ACS  Allergies  Allergen Reactions  . Ace Inhibitors     Pt unsure- nausea or hives  . Amoxicillin Hives  . Celebrex [Celecoxib] Other (See Comments)    Severe stomach cramps  . Enalapril Other (See Comments)    congestion  . Fentanyl Nausea And Vomiting  . Fish Oil Other (See Comments)    Broke out with blisters  . Gabapentin     Pain and swelling   . Lanolin Acid [Lanolin]     rash  . Nickel     rash  . Other Nausea And Vomiting    Narcotic Pain Killers- vomitting, nausea constantly  . Penicillins Hives    Has patient had a PCN reaction causing immediate rash, facial/tongue/throat swelling, SOB or lightheadedness with hypotension: unknown Has patient had a PCN reaction causing severe rash involving mucus membranes or skin necrosis: no Has patient had a PCN reaction that required hospitalization - no, was at MD office Has patient had a PCN reaction occurring within the last 10 years: no If all of the above answers are "NO", then may proceed with Cephalosporin use.   Marland Kitchen Propoxyphene Hcl Nausea Only  . Sulfonamide Derivatives Nausea Only and Other (See Comments)    Stomach cramping  . Synvisc [Hylan G-F 20]     Made knee swell up at injection site    Patient Measurements: Height: 5\' 2"  (157.5 cm) Weight: 186 lb 14.4 oz (84.8 kg) IBW/kg (Calculated) : 50.1 Heparin Dosing Weight: 67kg  Vital Signs: Temp: 97.8 F (36.6 C) (07/08 0755) Temp Source: Oral (07/08 0755) BP: 135/83 (07/08 0800) Pulse Rate: 78 (07/08 0800)  Labs:  Recent Labs  09/16/16 1215  09/16/16 1358 09/16/16 2021 09/16/16 2243 09/17/16 0243 09/17/16 0808  HGB 13.2  --   --   --   --  12.5  --   HCT 39.9  --   --   --   --  39.9  --   PLT 298  --   --   --   --  260  --   APTT  --   --   --  59*  --   --   --   LABPROT  --   --   --  14.1  --   --   --   INR  --   --   --  1.09  --   --    --   HEPARINUNFRC  --   --   --   --  0.44 0.39  --   CREATININE 0.83  --   --   --   --   --  0.86  CKTOTAL  --   --  24*  --   --   --   --   CKMB  --   --  1.7  --   --   --   --   TROPONINI  --   < > <0.03 <0.03  --  <0.03 <0.03  < > = values in this interval not displayed.  Estimated Creatinine Clearance: 65 mL/min (by C-G formula based on SCr of 0.86 mg/dL).   Medications:  Heparin @ 800 units/hr  Assessment: 65yof continues on heparin for chest pain with plan for cath tomorrow. Heparin level is therapeutic at 0.39. CBC stable No bleeding.  Goal of Therapy:  Heparin level 0.3-0.7 units/ml Monitor platelets by anticoagulation protocol:  Yes   Plan:  1) Continue heparin at 800 units/hr 2) Daily heparin level and CBC  Deboraha Sprang 09/17/2016,10:26 AM

## 2016-09-17 NOTE — Progress Notes (Signed)
Pt with order for [facility] every shift, 4n Charge RN/Emer stated unable to do, Charge RN/Lauren called from 4N ICU and stated will look at chart and determine if need to be done by guidelines and would send RN over to do if needed. 4N Charge updated.

## 2016-09-17 NOTE — Progress Notes (Signed)
  Echocardiogram 2D Echocardiogram has been performed.  Barbara [name] 09/17/2016, 3:49 PM

## 2016-09-17 NOTE — Evaluation (Signed)
Physical Therapy Evaluation and Discharge Patient Details Name: Barbara [name] MRN: [id number] DOB: 09/01/1950 Today's Date: 09/17/2016   History of Present Illness  Adm 09/16/16 with chest pain. enzymes negative; ? ST elevation on EKG with cardiac cath planned for 7/9  Past medical history of Hypertension, dyslipidemia, GERD, trigeminal neuralgia, bilateral scleritis, femur fx, (BPPV)  Clinical Impression  Patient evaluated by Physical Therapy with no further acute PT needs identified. All education has been completed and the patient has no further questions.  See below for any follow-up Physical Therapy or equipment needs. PT is signing off. Thank you for this referral.     Follow Up Recommendations No PT follow up    Equipment Recommendations  None recommended by PT    Recommendations for Other Services       Precautions / Restrictions Precautions Precautions: Fall      Mobility  Bed Mobility                  Transfers Overall transfer level: Independent Equipment used: None                Ambulation/Gait Ambulation/Gait assistance: Independent Ambulation Distance (Feet): 140 Feet Assistive device: None Gait Pattern/deviations: Step-through pattern;Wide base of support     General Gait Details: slow, cautious; reports this has been her baseline since the medicines have been making her dizzy  Stairs            Wheelchair Mobility    Modified Rankin (Stroke Patients Only)       Balance Overall balance assessment: Modified Independent                                           Pertinent Vitals/Pain Pain Assessment: No/denies pain    Home Living Family/patient expects to be discharged to:: Private residence Living Arrangements: Spouse/significant other   Type of Home: House Home Access: Stairs to enter Entrance Stairs-Rails: Left Entrance Stairs-Number of Steps: 2 Home Layout: Two level;Bed/bath upstairs Home  Equipment: Walker - 2 wheels;Cane - single point;Wheelchair - manual;Grab bars - tub/shower;Shower seat - built in;Shower seat      Prior Function Level of Independence: Independent         Comments: had problmes with dizziness and imbalnce since starting methotrexate and prednissone (now stopped this admission)     Hand Dominance        Extremity/Trunk Assessment        Lower Extremity Assessment Lower Extremity Assessment: Overall WFL for tasks assessed    Cervical / Trunk Assessment Cervical / Trunk Assessment: Normal  Communication   Communication: No difficulties  Cognition Arousal/Alertness: Awake/alert Behavior During Therapy: WFL for tasks assessed/performed                                          General Comments      Exercises     Assessment/Plan    PT Assessment Patent does not need any further PT services  PT Problem List         PT Treatment Interventions      PT Goals (Current goals can be found in the Care Plan section)  Acute Rehab PT Goals PT Goal Formulation: All assessment and education complete, DC therapy    Frequency  Barriers to discharge        Co-evaluation               AM-PAC PT "6 Clicks" Daily Activity  Outcome Measure Difficulty turning over in bed (including adjusting bedclothes, sheets and blankets)?: None Difficulty moving from lying on back to sitting on the side of the bed? : None Difficulty sitting down on and standing up from a chair with arms (e.g., wheelchair, bedside commode, etc,.)?: None Help needed moving to and from a bed to chair (including a wheelchair)?: None Help needed walking in hospital room?: None Help needed climbing 3-5 steps with a railing? : A Little 6 Click Score: 23    End of Session Equipment Utilized During Treatment: Gait belt Activity Tolerance: Patient tolerated treatment well Patient left: in chair;with call bell/phone within reach;with family/visitor  present Nurse Communication: Mobility status PT Visit Diagnosis: Unsteadiness on feet (R26.81)    Time: 1497-0263 PT Time Calculation (min) (ACUTE ONLY): 27 min   Charges:   PT Evaluation $PT Eval Low Complexity: 1 Procedure     PT G Codes:   PT G-Codes **NOT FOR INPATIENT CLASS** Functional Assessment Tool Used: AM-PAC 6 Clicks Basic Mobility Functional Limitation: Mobility: Walking and moving around Mobility: Walking and Moving Around Current Status (Z8588): At least 1 percent but less than 20 percent impaired, limited or restricted Mobility: Walking and Moving Around Goal Status [phone]): At least 1 percent but less than 20 percent impaired, limited or restricted Mobility: Walking and Moving Around Discharge Status [phone]): At least 1 percent but less than 20 percent impaired, limited or restricted      Rexanne Mano, PT 09/17/2016, 12:44 PM

## 2016-09-17 NOTE — Progress Notes (Signed)
Progress Note  Patient Name: Barbara [name] Date of Encounter: 09/17/2016  Primary Cardiologist: Dr. Harrington Challenger  Subjective   No chest pain.  No SOB  Inpatient Medications    Scheduled Meds: . [START ON 09/18/2016] aspirin  81 mg Oral Pre-Cath  . aspirin EC  325 mg Oral Daily  . folic acid  1 mg Oral Daily  . pantoprazole  40 mg Oral Daily  . prednisoLONE acetate  1 drop Both Eyes Daily  . predniSONE  40 mg Oral Q breakfast  . propranolol  10 mg Oral BID  . sodium chloride flush  3 mL Intravenous Q12H   Continuous Infusions: . sodium chloride 250 mL (09/16/16 1600)  . [START ON 09/18/2016] sodium chloride     Followed by  . [START ON 09/18/2016] sodium chloride    . heparin 800 Units/hr (09/16/16 1800)   PRN Meds: sodium chloride, acetaminophen, gi cocktail, loratadine, morphine injection, ondansetron (ZOFRAN) IV, sodium chloride flush   Vital Signs    Vitals:   09/17/16 0010 09/17/16 0353 09/17/16 0755 09/17/16 0800  BP: (!) 145/93 106/81 125/80 135/83  Pulse: 73 61 82 78  Resp: 16 (!) 96 (!) 24 (!) 22  Temp: 97.8 F (36.6 C) 97.7 F (36.5 C) 97.8 F (36.6 C)   TempSrc: Oral Oral Oral   SpO2: 95% 95% 98% 97%  Weight:      Height:        Intake/Output Summary (Last 24 hours) at 09/17/16 1101 Last data filed at 09/17/16 1000  Gross per 24 hour  Intake           1006.5 ml  Output              675 ml  Net            331.5 ml   Filed Weights   09/16/16 1501 09/16/16 1530  Weight: 186 lb 14.4 oz (84.8 kg) 186 lb 14.4 oz (84.8 kg)    Telemetry    NSR - Personally Reviewed  ECG    Pending  - Personally Reviewed  Physical Exam   GEN: No acute distress.   Neck: No  JVD Cardiac: RRR, no murmurs, rubs, or gallops.  Respiratory: Clear  to auscultation bilaterally. GI: Soft, nontender, non-distended  MS: No edema; No deformity. Neuro:  Nonfocal  Psych: Normal affect   Labs    Chemistry Recent Labs Lab 09/16/16 1215 09/17/16 0808  NA 140 140  K 3.6 3.9   CL 106 107  CO2 26 27  GLUCOSE 130* 93  BUN 17 14  CREATININE 0.83 0.86  CALCIUM 8.5* 8.4*  PROT 6.5  --   ALBUMIN 3.6  --   AST 18  --   ALT 19  --   ALKPHOS 56  --   BILITOT 1.1  --   GFRNONAA >60 >60  GFRAA >60 >60  ANIONGAP 8 6     Hematology Recent Labs Lab 09/16/16 1215 09/17/16 0243  WBC 14.1* 9.3  RBC 4.48 4.32  HGB 13.2 12.5  HCT 39.9 39.9  MCV 89.1 92.4  MCH 29.5 28.9  MCHC 33.1 31.3  RDW 13.8 14.3  PLT 298 260    Cardiac Enzymes Recent Labs Lab 09/16/16 1358 09/16/16 2021 09/17/16 0243 09/17/16 0808  TROPONINI <0.03 <0.03 <0.03 <0.03    Recent Labs Lab 09/16/16 1212  TROPIPOC 0.00     BNPNo results for input(s): BNP, PROBNP in the last 168 hours.   DDimer  Recent Labs Lab 09/17/16 0808  DDIMER 0.36     Radiology    Ct Head Wo Contrast  Result Date: 09/16/2016 CLINICAL DATA:  66 y/o  F; right facial numbness. EXAM: CT HEAD WITHOUT CONTRAST TECHNIQUE: Contiguous axial images were obtained from the base of the skull through the vertex without intravenous contrast. COMPARISON:  None. FINDINGS: Brain: No evidence of acute infarction, hemorrhage, hydrocephalus, extra-axial collection or mass lesion/mass effect. Few nonspecific foci of hypoattenuation in subcortical white matter compatible with mild chronic microvascular ischemic changes. Vascular: Mild calcific atherosclerosis of carotid siphons. No hyperdense vessel identified. Skull: Normal. Negative for fracture or focal lesion. Sinuses/Orbits: No acute finding. Other: None. IMPRESSION: 1. No acute intracranial abnormality identified. 2. Mild chronic microvascular ischemic changes of the brain. Electronically Signed   By: Kristine Garbe M.D.   On: 09/16/2016 14:32   Mr [name] BT Contrast  Result Date: 09/16/2016 CLINICAL DATA:  Initial evaluation for right-sided facial numbness, history of trigeminal neuralgia. EXAM: MRI HEAD WITHOUT AND WITH CONTRAST TECHNIQUE: Multiplanar,  multiecho pulse sequences of the brain and surrounding structures were obtained without and with intravenous contrast. CONTRAST:  27mL MULTIHANCE GADOBENATE DIMEGLUMINE 529 MG/ML IV SOLN COMPARISON:  Prior CT from 09/16/2016. FINDINGS: Brain: Cerebral volume within normal limits for age. Few scattered subcentimeter foci of T2/FLAIR hyperintensity present within the periventricular white matter of both cerebral hemispheres, nonspecific, but mild for age. No other focal parenchymal signal abnormality identified. No abnormal foci of restricted diffusion to suggest acute or subacute ischemia. Gray-white matter differentiation well maintained. No areas of encephalomalacia to suggest chronic infarction. No evidence for acute or chronic intracranial hemorrhage. Small solidly enhancing mass measuring 10 x 8 x 7 mm straddling the anterior falx is most consistent with a small meningioma (series 14, image 40). No associated mass effect. No other mass lesion, mass effect, or midline shift. Ventricles normal size without evidence for hydrocephalus. No extra-axial fluid collection. Major dural sinuses are patent. No abnormal enhancement. Specifically, no abnormal enhancement seen involving or along the course of either trigeminal nerve. [place] are symmetric and within normal limits bilaterally. No findings to suggest vascular compression. Pituitary gland suprasellar region within normal limits. Midline structures intact and normal. Vascular: Major intracranial vascular flow voids are well maintained and normal. Skull and upper cervical spine: Craniocervical junction within normal limits. Visualized upper cervical spine unremarkable. Bone marrow signal intensity within normal limits. No scalp soft tissue abnormality. Sinuses/Orbits: Globes and orbital soft tissues within normal limits. Paranasal sinuses are clear. No mastoid effusion. Inner ear structures normal. IMPRESSION: 1. Mild nonspecific cerebral white matter  changes for age. 2. Small 10 x 8 x 7 mm meningioma straddling the anterior falx without significant mass effect. 3. Otherwise unremarkable and normal brain MRI. No structural findings to explain patient's symptoms identified. Electronically Signed   By: Jeannine Boga M.D.   On: 09/16/2016 23:31   Dg Chest Port 1 View  Result Date: 09/16/2016 CLINICAL DATA:  Midsternal chest pain EXAM: PORTABLE CHEST 1 VIEW COMPARISON:  02/24/2016 FINDINGS: 1206 hours. The lungs are clear without focal pneumonia, edema, pneumothorax or pleural effusion. The cardiopericardial silhouette is within normal limits for size. The visualized bony structures of the thorax are intact. Telemetry leads overlie the chest. IMPRESSION: No active disease. Electronically Signed   By: Misty Stanley M.D.   On: 09/16/2016 12:28    Cardiac Studies   NA  Patient Profile     66 y.o. female without prior cardiac history.  She presented with chest pain.  Enzymes negative.   Assessment & Plan    CHEST PAIN:  Enzymes continue to be negative.   Plan for cath on Monday although this is an add on case.  We do not have a scheduled time. Continue IV heparin.    Of note her rheumatologist suspected that she stop the prednisone and methotrexate.  Methotrexate was not on the med active med list.  I stopped the prednisone.    Signed, Minus Breeding, MD  09/17/2016, 11:01 AM

## 2016-09-17 NOTE — Progress Notes (Signed)
After getting report I noted labs that were ordered had not been collected and no follow up Troponins ordered. 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 Dr. Posey Pronto stated in his note that he was going to do serial troponin so I called on call provider to see if he wanted to order them.  Dr.Opyd stated he would place order for Troponin and that the patient was seen by Dr. Posey Pronto while she was at [facility] ED. Dr. Myna Hidalgo states Dr.Patel documented a neuro assessment, but not an NIH stroke and Dr. Posey Pronto consulted Neurology.  Will continue to monitor and take patient down for MRI when they have an opening. Will reenter all lab orders as they do not cross over when entered outside of [facility] (even though its the same system).

## 2016-09-17 NOTE — Evaluation (Signed)
Occupational Therapy Evaluation/Discharge Patient Details Name: Barbara [name] MRN: [id number] DOB: 1951-02-14 Today's Date: 09/17/2016    History of Present Illness Adm 09/16/16 with chest pain. enzymes negative; ? ST elevation on EKG with cardiac cath planned for 7/9  Past medical history of Hypertension, dyslipidemia, GERD, trigeminal neuralgia, bilateral scleritis, femur fx, (BPPV)   Clinical Impression   Patient evaluated by Occupational Therapy with no further acute OT needs identified. Pt reports dizziness during all standing activities but that this has improved with recent medication changes. She was able to complete toilet transfers and grooming tasks standing at sink with modified independence. She demonstrates understanding of safety and fall prevention strategies. Discussed pt's low vision due to reported autoimmune disorder and she demonstrates understanding of low vision compensatory strategies. All education has been completed and the patient has no further questions. See below for any follow-up Occupational Therapy or equipment needs. OT to sign off. Thank you for referral.      Follow Up Recommendations  No OT follow up;Supervision - Intermittent    Equipment Recommendations  None recommended by OT    Recommendations for Other Services       Precautions / Restrictions Precautions Precautions: Fall Restrictions Weight Bearing Restrictions: No      Mobility Bed Mobility               General bed mobility comments: OOB in chair on arrival.   Transfers Overall transfer level: Modified independent Equipment used: None             General transfer comment: Increased time    Balance Overall balance assessment: Modified Independent                                         ADL either performed or assessed with clinical judgement   ADL Overall ADL's : Modified independent                                       General  ADL Comments: Increased time due to diziness at baseline. Demonstrates good understanding of safety measures.      Vision         Perception     Praxis      Pertinent Vitals/Pain Pain Assessment: No/denies pain     Hand Dominance     Extremity/Trunk Assessment Upper Extremity Assessment Upper Extremity Assessment: Overall WFL for tasks assessed   Lower Extremity Assessment Lower Extremity Assessment: Overall WFL for tasks assessed   Cervical / Trunk Assessment Cervical / Trunk Assessment: Normal   Communication Communication Communication: No difficulties   Cognition Arousal/Alertness: Awake/alert Behavior During Therapy: WFL for tasks assessed/performed Overall Cognitive Status: Within Functional Limits for tasks assessed                                     General Comments       Exercises     Shoulder Instructions      Home Living Family/patient expects to be discharged to:: Private residence Living Arrangements: Spouse/significant other Available Help at Discharge: Family;Available 24 hours/day Type of Home: House Home Access: Stairs to enter [employer] of Steps: 2 Entrance Stairs-Rails: Left Home Layout: Two level;Bed/bath upstairs Alternate Level Stairs-Number of  Steps: flight Alternate Level Stairs-Rails: Left Bathroom Shower/Tub: Walk-in shower   Bathroom Toilet: Handicapped height Bathroom Accessibility: Yes   Home Equipment: Walker - 2 wheels;Cane - single point;Wheelchair - manual;Grab bars - tub/shower;Shower seat - built in;Shower seat          Prior Functioning/Environment Level of Independence: Independent        Comments: had problems with dizziness and imbalance since starting methotrexate and prednisone (now stopped this admission)        OT Problem List: Decreased activity tolerance;Impaired vision/perception      OT Treatment/Interventions:      OT Goals(Current goals can be found in the care  plan section) Acute Rehab OT Goals Patient Stated Goal: to be able to be more active OT Goal Formulation: With patient  OT Frequency:     Barriers to D/C:            Co-evaluation              AM-PAC PT "6 Clicks" Daily Activity     Outcome Measure Help from another person eating meals?: None Help from another person taking care of personal grooming?: None Help from another person toileting, which includes using toliet, bedpan, or urinal?: None Help from another person bathing (including washing, rinsing, drying)?: None Help from another person to put on and taking off regular upper body clothing?: None Help from another person to put on and taking off regular lower body clothing?: None 6 Click Score: 24   End of Session Nurse Communication: Mobility status  Activity Tolerance: Patient tolerated treatment well Patient left: in chair;with call bell/phone within reach  OT Visit Diagnosis: Muscle weakness (generalized) (M62.81)                Time: 2518-9842 OT Time Calculation (min): 19 min Charges:  OT General Charges $OT Visit: 1 Procedure OT Evaluation $OT Eval Low Complexity: 1 Procedure G-Codes: OT G-codes **NOT FOR INPATIENT CLASS** Functional Assessment Tool Used: Clinical judgement Functional Limitation: Self care Self Care Current Status (J0312): 0 percent impaired, limited or restricted Self Care Goal Status (O1188): 0 percent impaired, limited or restricted Self Care Discharge Status (Q7737): 0 percent impaired, limited or restricted   [name], MS OTR/L  Pager: [name] 09/17/2016, 5:35 PM

## 2016-09-17 NOTE — Progress Notes (Signed)
[facility] TEAM 1 - Stepdown/ICU TEAM  Barbara [name]  IRW:[id number] DOB: 1950/11/20 DOA: 09/16/2016 PCP: Jonathon Jordan, MD    Brief Narrative:  66 y.o. female with history of HTN, HLD, GERD, and trigeminal neuralgia who presented w/ substernal chest pressure and diaphoresis which began when she was walking around the house. She was diaphoretic with chest pain.  Patient also c/o numbness of the right cheek for 24hrs.   In December 2017 she had a stress test which was low risk. She had upper GI endoscopy in January 2018, which was normal. Had esophageal manometry as well as pH studies which were also normal.  Subjective: Patient reports that her chest pain has entirely resolved and she is not short of breath when at rest.  She feels that her right facial numbness is almost entirely resolved.  Assessment & Plan:  Substernal chest pressure Troponins unrevealing - pain resolved spontaneously - cardiology following and to take the patient to cardiac cath possibly Monday  Right facial numbness MRI of brain without acute findings  Autoimmune bilateral scleritis Was being treated with methotrexate and prednisone as per records in [place] - has since been instructed to discontinue both these medications via telephone contact with her ophthalmologist   Benign essential tremor Continue beta blocker  GERD  Trigeminal neuralgia Followed by neurology in the outpatient setting  DVT prophylaxis: IV heparin  Code Status: FULL CODE Family Communication: no family present at time of exam  Disposition Plan:   Consultants:  [facility]   Procedures: TTE - 7/8 - EF 60-65% - no WMA - indeterminate DD  Antimicrobials:  none   Objective: Blood pressure 123/71, pulse 67, temperature 98.2 F (36.8 C), temperature source Oral, resp. rate 20, height 5\' 2"  (1.575 m), weight 84.8 kg (186 lb 14.4 oz), SpO2 96 %.  Intake/Output Summary (Last 24 hours) at 09/17/16 1743 Last data filed  at 09/17/16 1500  Gross per 24 hour  Intake          1180.67 ml  Output              554 ml  Net           626.67 ml   Filed Weights   09/16/16 1501 09/16/16 1530  Weight: 84.8 kg (186 lb 14.4 oz) 84.8 kg (186 lb 14.4 oz)    Examination: General: No acute respiratory distress Lungs: Clear to auscultation bilaterally without wheezes or crackles Cardiovascular: Regular rate and rhythm without murmur gallop or rub normal S1 and S2 Abdomen: NT/ND, soft, bs+ Extremities: No significant cyanosis, clubbing, or edema bilateral lower extremities  CBC:  Recent Labs Lab 09/16/16 1215 09/17/16 0243  WBC 14.1* 9.3  NEUTROABS 7.9*  --   HGB 13.2 12.5  HCT 39.9 39.9  MCV 89.1 92.4  PLT 298 761   Basic Metabolic Panel:  Recent Labs Lab 09/16/16 1215 09/17/16 0808  NA 140 140  K 3.6 3.9  CL 106 107  CO2 26 27  GLUCOSE 130* 93  BUN 17 14  CREATININE 0.83 0.86  CALCIUM 8.5* 8.4*  MG  --  2.2   GFR: Estimated Creatinine Clearance: 65 mL/min (by C-G formula based on SCr of 0.86 mg/dL).  Liver Function Tests:  Recent Labs Lab 09/16/16 1215  AST 18  ALT 19  ALKPHOS 56  BILITOT 1.1  PROT 6.5  ALBUMIN 3.6    Coagulation Profile:  Recent Labs Lab 09/16/16 2021  INR 1.09    Cardiac Enzymes:  Recent Labs Lab 09/16/16 1358 09/16/16 2021 09/17/16 0243 09/17/16 0808  CKTOTAL 24*  --   --   --   CKMB 1.7  --   --   --   TROPONINI <0.03 <0.03 <0.03 <0.03     Recent Results (from the past 240 hour(s))  MRSA PCR Screening     Status: None   Collection Time: 09/16/16  3:50 PM  Result Value Ref Range Status   MRSA by PCR NEGATIVE NEGATIVE Final    Comment:        The GeneXpert MRSA Assay (FDA approved for NASAL specimens only), is one component of a comprehensive MRSA colonization surveillance program. It is not intended to diagnose MRSA infection nor to guide or monitor treatment for MRSA infections.      Scheduled Meds: . [START ON 09/18/2016]  aspirin  81 mg Oral Pre-Cath  . aspirin EC  325 mg Oral Daily  . folic acid  1 mg Oral Daily  . pantoprazole  40 mg Oral Daily  . prednisoLONE acetate  1 drop Both Eyes Daily  . propranolol  10 mg Oral BID  . sodium chloride flush  3 mL Intravenous Q12H     LOS: 0 days   Cherene Altes, MD [facility] Office  [phone] Pager - Text Page per Shea Evans as per below:  On-Call/Text Page:      [email or web address]      password [email or web address]  If 7PM-7AM, please contact night-coverage [email or web address] Password [email or web address] 09/17/2016, 5:43 PM

## 2016-09-18 ENCOUNTER — Observation Stay (HOSPITAL_BASED_OUTPATIENT_CLINIC_OR_DEPARTMENT_OTHER): Payer: Medicare Other

## 2016-09-18 ENCOUNTER — Encounter (HOSPITAL_COMMUNITY): Payer: Self-pay | Admitting: Cardiovascular Disease

## 2016-09-18 ENCOUNTER — Ambulatory Visit (HOSPITAL_COMMUNITY)
Admission: EM | Disposition: A | Discharge status: Home / Self Care | Payer: Self-pay | Source: Home / Self Care | Attending: Emergency Medicine

## 2016-09-18 DIAGNOSIS — H5340 Unspecified visual field defects: Secondary | ICD-10-CM

## 2016-09-18 DIAGNOSIS — R072 Precordial pain: Secondary | ICD-10-CM | POA: Diagnosis not present

## 2016-09-18 DIAGNOSIS — H15003 Unspecified scleritis, bilateral: Secondary | ICD-10-CM | POA: Diagnosis not present

## 2016-09-18 DIAGNOSIS — R200 Anesthesia of skin: Secondary | ICD-10-CM | POA: Diagnosis not present

## 2016-09-18 HISTORY — PX: LEFT HEART CATH AND CORONARY ANGIOGRAPHY: CATH118249

## 2016-09-18 LAB — VAS US CAROTID
LEFT ECA DIAS: -16 cm/s
LEFT VERTEBRAL DIAS: -23 cm/s
Left CCA dist dias: 20 cm/s
Left CCA dist sys: 52 cm/s
Left CCA prox dias: 26 cm/s
Left CCA prox sys: 93 cm/s
Left ICA dist dias: -39 cm/s
Left ICA dist sys: -89 cm/s
Left ICA prox dias: 24 cm/s
Left ICA prox sys: 62 cm/s
RIGHT ECA DIAS: -15 cm/s
RIGHT VERTEBRAL DIAS: 22 cm/s
Right CCA prox dias: 19 cm/s
Right CCA prox sys: 61 cm/s
Right cca dist sys: -83 cm/s

## 2016-09-18 LAB — CBC
HCT: 40.6 % (ref 36.0–46.0)
Hemoglobin: 12.9 g/dL (ref 12.0–15.0)
MCH: 29.4 pg (ref 26.0–34.0)
MCHC: 31.8 g/dL (ref 30.0–36.0)
MCV: 92.5 fL (ref 78.0–100.0)
Platelets: 255 10*3/uL (ref 150–400)
RBC: 4.39 MIL/uL (ref 3.87–5.11)
RDW: 14.5 % (ref 11.5–15.5)
WBC: 7.7 10*3/uL (ref 4.0–10.5)

## 2016-09-18 LAB — BASIC METABOLIC PANEL
Anion gap: 5 (ref 5–15)
BUN: 13 mg/dL (ref 6–20)
CO2: 29 mmol/L (ref 22–32)
Calcium: 8.7 mg/dL — ABNORMAL LOW (ref 8.9–10.3)
Chloride: 104 mmol/L (ref 101–111)
Creatinine, Ser: 0.89 mg/dL (ref 0.44–1.00)
GFR calc Af Amer: >60 mL/min (ref >60)
GFR calc non Af Amer: >60 mL/min (ref >60)
Glucose, Bld: 101 mg/dL — ABNORMAL HIGH (ref 65–99)
Potassium: 3.9 mmol/L (ref 3.5–5.1)
Sodium: 138 mmol/L (ref 135–145)

## 2016-09-18 LAB — HEMOGLOBIN A1C
Hgb A1c MFr Bld: 6.2 % — ABNORMAL HIGH (ref 4.8–5.6)
Mean Plasma Glucose: 131 mg/dL

## 2016-09-18 LAB — HEPARIN LEVEL (UNFRACTIONATED): Heparin Unfractionated: 0.32 IU/mL (ref 0.30–0.70)

## 2016-09-18 SURGERY — LEFT HEART CATH AND CORONARY ANGIOGRAPHY
Anesthesia: LOCAL

## 2016-09-18 MED ORDER — SODIUM CHLORIDE 0.9% FLUSH: 3.0000 mL | INTRAVENOUS | Status: DC | PRN

## 2016-09-18 MED ORDER — VERAPAMIL HCL 2.5 MG/ML IV SOLN
INTRAVENOUS | Status: DC | PRN
  Administered 2016-09-18: 10 mL via INTRA_ARTERIAL

## 2016-09-18 MED ORDER — VERAPAMIL HCL 2.5 MG/ML IV SOLN
INTRAVENOUS | Status: AC
  Filled 2016-09-18: qty 2

## 2016-09-18 MED ORDER — FENTANYL CITRATE (PF) 100 MCG/2ML IJ SOLN
INTRAMUSCULAR | Status: AC
  Filled 2016-09-18: qty 2

## 2016-09-18 MED ORDER — IOPAMIDOL (ISOVUE-370) INJECTION 76%
INTRAVENOUS | Status: AC
  Filled 2016-09-18: qty 100

## 2016-09-18 MED ORDER — MIDAZOLAM HCL 2 MG/2ML IJ SOLN
INTRAMUSCULAR | Status: DC | PRN
  Administered 2016-09-18: 2 mg via INTRAVENOUS

## 2016-09-18 MED ORDER — HEPARIN SODIUM (PORCINE) 1000 UNIT/ML IJ SOLN
INTRAMUSCULAR | Status: AC
  Filled 2016-09-18: qty 1

## 2016-09-18 MED ORDER — SODIUM CHLORIDE 0.9% FLUSH: 3.0000 mL | Freq: Two times a day (BID) | INTRAVENOUS | Status: DC

## 2016-09-18 MED ORDER — SODIUM CHLORIDE 0.9 % WEIGHT BASED INFUSION
1.0000 mL/kg/h | INTRAVENOUS | Status: AC
  Administered 2016-09-18: 1 mL/kg/h via INTRAVENOUS

## 2016-09-18 MED ORDER — MIDAZOLAM HCL 2 MG/2ML IJ SOLN
INTRAMUSCULAR | Status: AC
  Filled 2016-09-18: qty 2

## 2016-09-18 MED ORDER — LIDOCAINE HCL (PF) 1 % IJ SOLN
INTRAMUSCULAR | Status: DC | PRN
  Administered 2016-09-18: 2 mL

## 2016-09-18 MED ORDER — ACETAMINOPHEN 325 MG PO TABS: 650.0000 mg | ORAL_TABLET | ORAL | Status: DC | PRN

## 2016-09-18 MED ORDER — SODIUM CHLORIDE 0.9 % IV SOLN: 250.0000 mL | INTRAVENOUS | Status: DC | PRN

## 2016-09-18 MED ORDER — HEPARIN (PORCINE) IN NACL 2-0.9 UNIT/ML-% IJ SOLN
INTRAMUSCULAR | Status: AC
  Filled 2016-09-18: qty 1000

## 2016-09-18 MED ORDER — IOPAMIDOL (ISOVUE-370) INJECTION 76%
INTRAVENOUS | Status: DC | PRN
  Administered 2016-09-18: 65 mL via INTRA_ARTERIAL

## 2016-09-18 MED ORDER — HEPARIN (PORCINE) IN NACL 2-0.9 UNIT/ML-% IJ SOLN
INTRAMUSCULAR | Status: AC | PRN
Start: 2016-09-18 — End: 2016-09-18
  Administered 2016-09-18: 1000 mL via INTRA_ARTERIAL

## 2016-09-18 MED ORDER — LIDOCAINE HCL 1 % IJ SOLN
INTRAMUSCULAR | Status: AC
  Filled 2016-09-18: qty 20

## 2016-09-18 MED ORDER — HEPARIN SODIUM (PORCINE) 1000 UNIT/ML IJ SOLN
INTRAMUSCULAR | Status: DC | PRN
  Administered 2016-09-18: 4500 [IU] via INTRAVENOUS

## 2016-09-18 SURGICAL SUPPLY — 12 items
CATH INFINITI 5 FR JL3.5 (CATHETERS) ×1 IMPLANT
CATH INFINITI 5FR ANG PIGTAIL (CATHETERS) ×1 IMPLANT
CATH INFINITI JR4 5F (CATHETERS) ×1 IMPLANT
DEVICE RAD COMP TR BAND LRG (VASCULAR PRODUCTS) ×1 IMPLANT
GLIDESHEATH SLEND SS 6F .021 (SHEATH) ×1 IMPLANT
GUIDEWIRE INQWIRE 1.5J.035X260 (WIRE) IMPLANT
INQWIRE 1.5J .035X260CM (WIRE) ×2
KIT HEART LEFT (KITS) ×2 IMPLANT
PACK CARDIAC CATHETERIZATION (CUSTOM PROCEDURE TRAY) ×2 IMPLANT
SYR MEDRAD MARK V 150ML (SYRINGE) ×2 IMPLANT
TRANSDUCER W/STOPCOCK (MISCELLANEOUS) ×2 IMPLANT
TUBING CIL FLEX 10 FLL-RA (TUBING) ×2 IMPLANT

## 2016-09-18 NOTE — Progress Notes (Signed)
Preliminary results by tech - Carotid Duplex Completed. No evidence of a significant stenosis in bilateral carotid arteries. Vertebral arteries demonstrated antegrade flow. Akelia Husted, BS, RDMS, RVT  

## 2016-09-18 NOTE — Progress Notes (Signed)
ANTICOAGULATION CONSULT NOTE - Follow Up Consult  Pharmacy Consult for Heparin Indication: chest pain/ACS  Allergies  Allergen Reactions  . Ace Inhibitors     Pt unsure- nausea or hives  . Amoxicillin Hives  . Celebrex [Celecoxib] Other (See Comments)    Severe stomach cramps  . Enalapril Other (See Comments)    congestion  . Fentanyl Nausea And Vomiting  . Fish Oil Other (See Comments)    Broke out with blisters  . Gabapentin     Pain and swelling   . Lanolin Acid [Lanolin]     rash  . Nickel     rash  . Other Nausea And Vomiting    Narcotic Pain Killers- vomitting, nausea constantly  . Penicillins Hives    Has patient had a PCN reaction causing immediate rash, facial/tongue/throat swelling, SOB or lightheadedness with hypotension: unknown Has patient had a PCN reaction causing severe rash involving mucus membranes or skin necrosis: no Has patient had a PCN reaction that required hospitalization - no, was at MD office Has patient had a PCN reaction occurring within the last 10 years: no If all of the above answers are "NO", then may proceed with Cephalosporin use.   Marland Kitchen Propoxyphene Hcl Nausea Only  . Sulfonamide Derivatives Nausea Only and Other (See Comments)    Stomach cramping  . Synvisc [Hylan G-F 20]     Made knee swell up at injection site    Patient Measurements: Height: 5\' 2"  (157.5 cm) Weight: 186 lb 14.4 oz (84.8 kg) IBW/kg (Calculated) : 50.1 Heparin Dosing Weight: 67kg  Vital Signs: Temp: 97.6 F (36.4 C) (07/09 0404) Temp Source: Oral (07/09 0404) BP: 122/77 (07/09 0404) Pulse Rate: 66 (07/09 0404)  Labs:  Recent Labs  09/16/16 1215  09/16/16 1358 09/16/16 2021 09/16/16 2243 09/17/16 0243 09/17/16 0808 09/18/16 0318  HGB 13.2  --   --   --   --  12.5  --  12.9  HCT 39.9  --   --   --   --  39.9  --  40.6  PLT 298  --   --   --   --  260  --  255  APTT  --   --   --  59*  --   --   --   --   LABPROT  --   --   --  14.1  --   --   --   --    INR  --   --   --  1.09  --   --   --   --   HEPARINUNFRC  --   --   --   --  0.44 0.39  --  0.32  CREATININE 0.83  --   --   --   --   --  0.86 0.89  CKTOTAL  --   --  24*  --   --   --   --   --   CKMB  --   --  1.7  --   --   --   --   --   TROPONINI  --   < > <0.03 <0.03  --  <0.03 <0.03  --   < > = values in this interval not displayed.  Estimated Creatinine Clearance: 62.8 mL/min (by C-G formula based on SCr of 0.89 mg/dL).   Medications:  Heparin @ 800 units/hr  Assessment: 65yof continues on heparin for chest pain with plan for cath  planned today. Heparin level is therapeutic at 0.32. CBC stable No bleeding.  Goal of Therapy:  Heparin level 0.3-0.7 units/ml Monitor platelets by anticoagulation protocol: Yes   Plan:  1) Continue heparin at 800 units/hr 2) Daily heparin level and CBC  Vincenza Hews, PharmD, BCPS 09/18/2016, 7:55 AM

## 2016-09-18 NOTE — Interval H&P Note (Signed)
History and Physical Interval Note:  09/18/2016 3:37 PM  Barbara [name]  has presented today for surgery, with the diagnosis of unstable angina  The various methods of treatment have been discussed with the patient and family. After consideration of risks, benefits and other options for treatment, the patient has consented to  Procedure(s): Left Heart Cath and Coronary Angiography (N/A) as a surgical intervention .  The patient's history has been reviewed, patient examined, no change in status, stable for surgery.  I have reviewed the patient's chart and labs.  Questions were answered to the patient's satisfaction.     Sherren Mocha

## 2016-09-18 NOTE — H&P (View-Only) (Signed)
Progress Note  Patient Name: Barbara [name] Date of Encounter: 09/17/2016  Primary Cardiologist: Dr. Harrington Challenger  Subjective   No chest pain.  No SOB  Inpatient Medications    Scheduled Meds: . [START ON 09/18/2016] aspirin  81 mg Oral Pre-Cath  . aspirin EC  325 mg Oral Daily  . folic acid  1 mg Oral Daily  . pantoprazole  40 mg Oral Daily  . prednisoLONE acetate  1 drop Both Eyes Daily  . predniSONE  40 mg Oral Q breakfast  . propranolol  10 mg Oral BID  . sodium chloride flush  3 mL Intravenous Q12H   Continuous Infusions: . sodium chloride 250 mL (09/16/16 1600)  . [START ON 09/18/2016] sodium chloride     Followed by  . [START ON 09/18/2016] sodium chloride    . heparin 800 Units/hr (09/16/16 1800)   PRN Meds: sodium chloride, acetaminophen, gi cocktail, loratadine, morphine injection, ondansetron (ZOFRAN) IV, sodium chloride flush   Vital Signs    Vitals:   09/17/16 0010 09/17/16 0353 09/17/16 0755 09/17/16 0800  BP: (!) 145/93 106/81 125/80 135/83  Pulse: 73 61 82 78  Resp: 16 (!) 96 (!) 24 (!) 22  Temp: 97.8 F (36.6 C) 97.7 F (36.5 C) 97.8 F (36.6 C)   TempSrc: Oral Oral Oral   SpO2: 95% 95% 98% 97%  Weight:      Height:        Intake/Output Summary (Last 24 hours) at 09/17/16 1101 Last data filed at 09/17/16 1000  Gross per 24 hour  Intake           1006.5 ml  Output              675 ml  Net            331.5 ml   Filed Weights   09/16/16 1501 09/16/16 1530  Weight: 186 lb 14.4 oz (84.8 kg) 186 lb 14.4 oz (84.8 kg)    Telemetry    NSR - Personally Reviewed  ECG    Pending  - Personally Reviewed  Physical Exam   GEN: No acute distress.   Neck: No  JVD Cardiac: RRR, no murmurs, rubs, or gallops.  Respiratory: Clear  to auscultation bilaterally. GI: Soft, nontender, non-distended  MS: No edema; No deformity. Neuro:  Nonfocal  Psych: Normal affect   Labs    Chemistry Recent Labs Lab 09/16/16 1215 09/17/16 0808  NA 140 140  K 3.6 3.9   CL 106 107  CO2 26 27  GLUCOSE 130* 93  BUN 17 14  CREATININE 0.83 0.86  CALCIUM 8.5* 8.4*  PROT 6.5  --   ALBUMIN 3.6  --   AST 18  --   ALT 19  --   ALKPHOS 56  --   BILITOT 1.1  --   GFRNONAA >60 >60  GFRAA >60 >60  ANIONGAP 8 6     Hematology Recent Labs Lab 09/16/16 1215 09/17/16 0243  WBC 14.1* 9.3  RBC 4.48 4.32  HGB 13.2 12.5  HCT 39.9 39.9  MCV 89.1 92.4  MCH 29.5 28.9  MCHC 33.1 31.3  RDW 13.8 14.3  PLT 298 260    Cardiac Enzymes Recent Labs Lab 09/16/16 1358 09/16/16 2021 09/17/16 0243 09/17/16 0808  TROPONINI <0.03 <0.03 <0.03 <0.03    Recent Labs Lab 09/16/16 1212  TROPIPOC 0.00     BNPNo results for input(s): BNP, PROBNP in the last 168 hours.   DDimer  Recent Labs Lab 09/17/16 0808  DDIMER 0.36     Radiology    Ct Head Wo Contrast  Result Date: 09/16/2016 CLINICAL DATA:  66 y/o  F; right facial numbness. EXAM: CT HEAD WITHOUT CONTRAST TECHNIQUE: Contiguous axial images were obtained from the base of the skull through the vertex without intravenous contrast. COMPARISON:  None. FINDINGS: Brain: No evidence of acute infarction, hemorrhage, hydrocephalus, extra-axial collection or mass lesion/mass effect. Few nonspecific foci of hypoattenuation in subcortical white matter compatible with mild chronic microvascular ischemic changes. Vascular: Mild calcific atherosclerosis of carotid siphons. No hyperdense vessel identified. Skull: Normal. Negative for fracture or focal lesion. Sinuses/Orbits: No acute finding. Other: None. IMPRESSION: 1. No acute intracranial abnormality identified. 2. Mild chronic microvascular ischemic changes of the brain. Electronically Signed   By: Kristine Garbe M.D.   On: 09/16/2016 14:32   Mr [name] YB Contrast  Result Date: 09/16/2016 CLINICAL DATA:  Initial evaluation for right-sided facial numbness, history of trigeminal neuralgia. EXAM: MRI HEAD WITHOUT AND WITH CONTRAST TECHNIQUE: Multiplanar,  multiecho pulse sequences of the brain and surrounding structures were obtained without and with intravenous contrast. CONTRAST:  82mL MULTIHANCE GADOBENATE DIMEGLUMINE 529 MG/ML IV SOLN COMPARISON:  Prior CT from 09/16/2016. FINDINGS: Brain: Cerebral volume within normal limits for age. Few scattered subcentimeter foci of T2/FLAIR hyperintensity present within the periventricular white matter of both cerebral hemispheres, nonspecific, but mild for age. No other focal parenchymal signal abnormality identified. No abnormal foci of restricted diffusion to suggest acute or subacute ischemia. Gray-white matter differentiation well maintained. No areas of encephalomalacia to suggest chronic infarction. No evidence for acute or chronic intracranial hemorrhage. Small solidly enhancing mass measuring 10 x 8 x 7 mm straddling the anterior falx is most consistent with a small meningioma (series 14, image 40). No associated mass effect. No other mass lesion, mass effect, or midline shift. Ventricles normal size without evidence for hydrocephalus. No extra-axial fluid collection. Major dural sinuses are patent. No abnormal enhancement. Specifically, no abnormal enhancement seen involving or along the course of either trigeminal nerve. [place] are symmetric and within normal limits bilaterally. No findings to suggest vascular compression. Pituitary gland suprasellar region within normal limits. Midline structures intact and normal. Vascular: Major intracranial vascular flow voids are well maintained and normal. Skull and upper cervical spine: Craniocervical junction within normal limits. Visualized upper cervical spine unremarkable. Bone marrow signal intensity within normal limits. No scalp soft tissue abnormality. Sinuses/Orbits: Globes and orbital soft tissues within normal limits. Paranasal sinuses are clear. No mastoid effusion. Inner ear structures normal. IMPRESSION: 1. Mild nonspecific cerebral white matter  changes for age. 2. Small 10 x 8 x 7 mm meningioma straddling the anterior falx without significant mass effect. 3. Otherwise unremarkable and normal brain MRI. No structural findings to explain patient's symptoms identified. Electronically Signed   By: Jeannine Boga M.D.   On: 09/16/2016 23:31   Dg Chest Port 1 View  Result Date: 09/16/2016 CLINICAL DATA:  Midsternal chest pain EXAM: PORTABLE CHEST 1 VIEW COMPARISON:  02/24/2016 FINDINGS: 1206 hours. The lungs are clear without focal pneumonia, edema, pneumothorax or pleural effusion. The cardiopericardial silhouette is within normal limits for size. The visualized bony structures of the thorax are intact. Telemetry leads overlie the chest. IMPRESSION: No active disease. Electronically Signed   By: Misty Stanley M.D.   On: 09/16/2016 12:28    Cardiac Studies   NA  Patient Profile     66 y.o. female without prior cardiac history.  She presented with chest pain.  Enzymes negative.   Assessment & Plan    CHEST PAIN:  Enzymes continue to be negative.   Plan for cath on Monday although this is an add on case.  We do not have a scheduled time. Continue IV heparin.    Of note her rheumatologist suspected that she stop the prednisone and methotrexate.  Methotrexate was not on the med active med list.  I stopped the prednisone.    Signed, Minus Breeding, MD  09/17/2016, 11:01 AM

## 2016-09-18 NOTE — Progress Notes (Signed)
Progress Note  Patient Name: Barbara [name] Date of Encounter: 09/18/2016  Primary Cardiologist: Dr. Harrington Challenger  Subjective   No chest pain or dyspnea.   Inpatient Medications    Scheduled Meds: . aspirin EC  325 mg Oral Daily  . folic acid  1 mg Oral Daily  . pantoprazole  40 mg Oral Daily  . prednisoLONE acetate  1 drop Both Eyes Daily  . propranolol  10 mg Oral BID  . sodium chloride flush  3 mL Intravenous Q12H   Continuous Infusions: . sodium chloride 250 mL (09/16/16 1600)  . sodium chloride 1 mL/kg/hr (09/18/16 0700)  . heparin 800 Units/hr (09/17/16 2100)   PRN Meds: sodium chloride, acetaminophen, gi cocktail, loratadine, morphine injection, ondansetron (ZOFRAN) IV, sodium chloride flush   Vital Signs    Vitals:   09/17/16 2016 09/18/16 0034 09/18/16 0404 09/18/16 0700  BP: 119/68 110/75 122/77   Pulse: 75 71 66   Resp: 16 19 16    Temp: 98.6 F (37 C) 97.7 F (36.5 C) 97.6 F (36.4 C) 97.9 F (36.6 C)  TempSrc: Oral Oral Oral Oral  SpO2: 97% 93% 92%   Weight:      Height:        Intake/Output Summary (Last 24 hours) at 09/18/16 1100 Last data filed at 09/18/16 0600  Gross per 24 hour  Intake          1117.36 ml  Output             1604 ml  Net          -486.64 ml   Filed Weights   09/16/16 1501 09/16/16 1530  Weight: 186 lb 14.4 oz (84.8 kg) 186 lb 14.4 oz (84.8 kg)    Telemetry    SR - Personally Reviewed  ECG    None today   Physical Exam   GEN: No acute distress.   Neck: No JVD Cardiac: RRR, no murmurs, rubs, or gallops.  Respiratory: Clear to auscultation bilaterally. GI: Soft, nontender, non-distended  MS: No edema; No deformity. Neuro:  Nonfocal  Psych: Normal affect   Labs    Chemistry Recent Labs Lab 09/16/16 1215 09/17/16 0808 09/18/16 0318  NA 140 140 138  K 3.6 3.9 3.9  CL 106 107 104  CO2 26 27 29   GLUCOSE 130* 93 101*  BUN 17 14 13   CREATININE 0.83 0.86 0.89  CALCIUM 8.5* 8.4* 8.7*  PROT 6.5  --   --     ALBUMIN 3.6  --   --   AST 18  --   --   ALT 19  --   --   ALKPHOS 56  --   --   BILITOT 1.1  --   --   GFRNONAA >60 >60 >60  GFRAA >60 >60 >60  ANIONGAP 8 6 5      Hematology Recent Labs Lab 09/16/16 1215 09/17/16 0243 09/18/16 0318  WBC 14.1* 9.3 7.7  RBC 4.48 4.32 4.39  HGB 13.2 12.5 12.9  HCT 39.9 39.9 40.6  MCV 89.1 92.4 92.5  MCH 29.5 28.9 29.4  MCHC 33.1 31.3 31.8  RDW 13.8 14.3 14.5  PLT 298 260 255    Cardiac Enzymes Recent Labs Lab 09/16/16 1358 09/16/16 2021 09/17/16 0243 09/17/16 0808  TROPONINI <0.03 <0.03 <0.03 <0.03    Recent Labs Lab 09/16/16 1212  TROPIPOC 0.00     BNPNo results for input(s): BNP, PROBNP in the last 168 hours.   DDimer  Recent Labs Lab 09/17/16 0808  DDIMER 0.36     Radiology    Ct Head Wo Contrast  Result Date: 09/16/2016 CLINICAL DATA:  66 y/o  F; right facial numbness. EXAM: CT HEAD WITHOUT CONTRAST TECHNIQUE: Contiguous axial images were obtained from the base of the skull through the vertex without intravenous contrast. COMPARISON:  None. FINDINGS: Brain: No evidence of acute infarction, hemorrhage, hydrocephalus, extra-axial collection or mass lesion/mass effect. Few nonspecific foci of hypoattenuation in subcortical white matter compatible with mild chronic microvascular ischemic changes. Vascular: Mild calcific atherosclerosis of carotid siphons. No hyperdense vessel identified. Skull: Normal. Negative for fracture or focal lesion. Sinuses/Orbits: No acute finding. Other: None. IMPRESSION: 1. No acute intracranial abnormality identified. 2. Mild chronic microvascular ischemic changes of the brain. Electronically Signed   By: Kristine Garbe M.D.   On: 09/16/2016 14:32   Mr [name] XV Contrast  Result Date: 09/16/2016 CLINICAL DATA:  Initial evaluation for right-sided facial numbness, history of trigeminal neuralgia. EXAM: MRI HEAD WITHOUT AND WITH CONTRAST TECHNIQUE: Multiplanar, multiecho pulse sequences of  the brain and surrounding structures were obtained without and with intravenous contrast. CONTRAST:  46mL MULTIHANCE GADOBENATE DIMEGLUMINE 529 MG/ML IV SOLN COMPARISON:  Prior CT from 09/16/2016. FINDINGS: Brain: Cerebral volume within normal limits for age. Few scattered subcentimeter foci of T2/FLAIR hyperintensity present within the periventricular white matter of both cerebral hemispheres, nonspecific, but mild for age. No other focal parenchymal signal abnormality identified. No abnormal foci of restricted diffusion to suggest acute or subacute ischemia. Gray-white matter differentiation well maintained. No areas of encephalomalacia to suggest chronic infarction. No evidence for acute or chronic intracranial hemorrhage. Small solidly enhancing mass measuring 10 x 8 x 7 mm straddling the anterior falx is most consistent with a small meningioma (series 14, image 40). No associated mass effect. No other mass lesion, mass effect, or midline shift. Ventricles normal size without evidence for hydrocephalus. No extra-axial fluid collection. Major dural sinuses are patent. No abnormal enhancement. Specifically, no abnormal enhancement seen involving or along the course of either trigeminal nerve. [place] are symmetric and within normal limits bilaterally. No findings to suggest vascular compression. Pituitary gland suprasellar region within normal limits. Midline structures intact and normal. Vascular: Major intracranial vascular flow voids are well maintained and normal. Skull and upper cervical spine: Craniocervical junction within normal limits. Visualized upper cervical spine unremarkable. Bone marrow signal intensity within normal limits. No scalp soft tissue abnormality. Sinuses/Orbits: Globes and orbital soft tissues within normal limits. Paranasal sinuses are clear. No mastoid effusion. Inner ear structures normal. IMPRESSION: 1. Mild nonspecific cerebral white matter changes for age. 2. Small 10 x 8 x  7 mm meningioma straddling the anterior falx without significant mass effect. 3. Otherwise unremarkable and normal brain MRI. No structural findings to explain patient's symptoms identified. Electronically Signed   By: Jeannine Boga M.D.   On: 09/16/2016 23:31   Dg Chest Port 1 View  Result Date: 09/16/2016 CLINICAL DATA:  Midsternal chest pain EXAM: PORTABLE CHEST 1 VIEW COMPARISON:  02/24/2016 FINDINGS: 1206 hours. The lungs are clear without focal pneumonia, edema, pneumothorax or pleural effusion. The cardiopericardial silhouette is within normal limits for size. The visualized bony structures of the thorax are intact. Telemetry leads overlie the chest. IMPRESSION: No active disease. Electronically Signed   By: Misty Stanley M.D.   On: 09/16/2016 12:28    Cardiac Studies   CAth today   Patient Profile     66 y.o. female with hx  of HTN, HLD, GERD and anxiety presented with chest pain.   Assessment & Plan    1. Chest pain - Troponin negative. Remained chest pain free. Continue current medications. Cath today.   2. HTN - stable on bb.   3. Autoimmune bilateral scleritis - D/C methotrexate and prednisone. Per primary.   4. Right facial numbness MRI of brain without acute findings  Signed, Bhagat,Bhavinkumar, PA  09/18/2016, 11:00 AM    Pt evaluated in cath lab at time of procedure. Stable for cardiac cath to evaluate cardiac etiology of chest pain.  Sherren Mocha

## 2016-09-18 NOTE — Progress Notes (Addendum)
[facility] TEAM 1 - Stepdown/ICU TEAM  Barbara [name]  ZYS:[id number] DOB: [birth year] DOA: 09/16/2016 PCP: Jonathon Jordan, MD    Brief Narrative:  67 y.o. female with history of HTN, HLD, GERD, and trigeminal neuralgia who presented w/ substernal chest pressure and diaphoresis which began when she was walking around the house. She was diaphoretic with chest pain.  Patient also c/o numbness of the right cheek for 24hrs.   In December 2017 she had a stress test which was low risk. She had upper GI endoscopy in January 2018, which was normal. Had esophageal manometry as well as pH studies which were also normal.  Subjective: The patient is seen post cath.  She denies chest pain shortness breath fevers chills nausea vomiting or abdominal pain.  She does report to me that since awakening from the cardiac cath she has had "blurry lines and fuzziness" on the left side of her vision.  She is able to confirm this is affecting both eyes.  It is not a loss of vision or a complete visual field cut simply fuzziness and some missing holes within her vision.  She denies any other neurologic symptoms whatsoever.  During the time that I am speaking with her she actually reports that the visual changes are slowly but steadily improving.  Assessment & Plan:  Substernal chest pressure Troponins unrevealing - pain resolved spontaneously - cardiac cath today very reassuring/unrevealing - symptoms have resolved  Left homonymous visual field disturbances Physical exam reveals no other evidence of neurologic deficit - clinically I'm not convinced this represents an acute stroke or CVA - Cardiology reassures me that this is sometimes experienced post cath - I have discussed this at length with the patient and we have decided to monitor her overnight to assure that her symptoms entirely resolved and no other symptoms develop  Right facial numbness MRI of brain without acute findings - no further complaints at this time -  carotid duplex unrevealing  Autoimmune bilateral scleritis Was being treated with methotrexate and prednisone as per records in [place] - has since been instructed to discontinue both these medications via telephone contact with her Ophthalmologist   Benign essential tremor Continue beta blocker  GERD  Trigeminal neuralgia Followed by Neurology in the outpatient setting  DVT prophylaxis: IV heparin  Code Status: FULL CODE Family Communication: Spoke with spouse at the bedside  Disposition Plan: Plan for discharge home in a.m. if no further symptoms overnight and if visual field disturbances are fully resolved  Consultants:  [facility]   Procedures: TTE - 7/8 - EF 60-65% - no WMA - indeterminate DD  Antimicrobials:  none   Objective: Blood pressure (!) 147/91, pulse 79, temperature 98.3 F (36.8 C), temperature source Oral, resp. rate 11, height 5\' 2"  (1.575 m), weight 84.8 kg (186 lb 14.4 oz), SpO2 98 %.  Intake/Output Summary (Last 24 hours) at 09/18/16 1756 Last data filed at 09/18/16 0600  Gross per 24 hour  Intake           757.36 ml  Output             1000 ml  Net          -242.64 ml   Filed Weights   09/16/16 1501 09/16/16 1530  Weight: 84.8 kg (186 lb 14.4 oz) 84.8 kg (186 lb 14.4 oz)    Examination: General: No acute respiratory distress Lungs: CTA B w/o wheezing  Cardiovascular: RRR w/o M G R Abdomen: NT/ND, soft,  bs+ Extremities: No significant edema B LE  Neuro:  CN2-12 intact B - 5/5 strength B U & LE - A&O x4   CBC:  Recent Labs Lab 09/16/16 1215 09/17/16 0243 09/18/16 0318  WBC 14.1* 9.3 7.7  NEUTROABS 7.9*  --   --   HGB 13.2 12.5 12.9  HCT 39.9 39.9 40.6  MCV 89.1 92.4 92.5  PLT 298 260 165   Basic Metabolic Panel:  Recent Labs Lab 09/16/16 1215 09/17/16 0808 09/18/16 0318  NA 140 140 138  K 3.6 3.9 3.9  CL 106 107 104  CO2 26 27 29   GLUCOSE 130* 93 101*  BUN 17 14 13   CREATININE 0.83 0.86 0.89  CALCIUM 8.5*  8.4* 8.7*  MG  --  2.2  --    GFR: Estimated Creatinine Clearance: 62.8 mL/min (by C-G formula based on SCr of 0.89 mg/dL).  Liver Function Tests:  Recent Labs Lab 09/16/16 1215  AST 18  ALT 19  ALKPHOS 56  BILITOT 1.1  PROT 6.5  ALBUMIN 3.6    Coagulation Profile:  Recent Labs Lab 09/16/16 2021  INR 1.09    Cardiac Enzymes:  Recent Labs Lab 09/16/16 1358 09/16/16 2021 09/17/16 0243 09/17/16 0808  CKTOTAL 24*  --   --   --   CKMB 1.7  --   --   --   TROPONINI <0.03 <0.03 <0.03 <0.03     Recent Results (from the past 240 hour(s))  MRSA PCR Screening     Status: None   Collection Time: 09/16/16  3:50 PM  Result Value Ref Range Status   MRSA by PCR NEGATIVE NEGATIVE Final    Comment:        The GeneXpert MRSA Assay (FDA approved for NASAL specimens only), is one component of a comprehensive MRSA colonization surveillance program. It is not intended to diagnose MRSA infection nor to guide or monitor treatment for MRSA infections.      Scheduled Meds: . aspirin EC  325 mg Oral Daily  . folic acid  1 mg Oral Daily  . pantoprazole  40 mg Oral Daily  . prednisoLONE acetate  1 drop Both Eyes Daily  . propranolol  10 mg Oral BID  . [START ON 09/19/2016] sodium chloride flush  3 mL Intravenous Q12H     LOS: 0 days   Cherene Altes, MD [facility] Office  [phone] Pager - Text Page per Amion as per below:  On-Call/Text Page:      [email or web address]      password [email or web address]  If 7PM-7AM, please contact night-coverage [email or web address] Password [email or web address] 09/18/2016, 5:56 PM

## 2016-09-18 NOTE — Plan of Care (Signed)
Problem: Cardiovascular: Goal: Ability to achieve and maintain adequate cardiovascular perfusion will improve Outcome: Not Progressing Cath scheduled for today

## 2016-09-18 NOTE — Progress Notes (Signed)
SLP Cancellation Note  Patient Details Name: Barbara [name] MRN: [id number] DOB: 12/31/1950   Cancelled treatment:       Reason Eval/Treat Not Completed: SLP screened, no needs identified, will sign off. Pt's MRI was negative and she reports that her facial numbness has resolved. She has no subjective complaints, therefore she and her family politely defer full evaluation at this time. Will sign off - please reorder if needed.   Germain Osgood 09/18/2016, 10:27 AM  Germain Osgood, M.A. CCC-SLP [phone]

## 2016-09-18 NOTE — Care Management Note (Signed)
Case Management Note  Patient Details  Name: Barbara [name] MRN: [id number] Date of Birth: 30-Mar-1950  Subjective/Objective:       Pt admitted with CP              Action/Plan:   PTA independent from home.  CM will continue to follow for discharge needs   Expected Discharge Date:  09/19/16               Expected Discharge Plan:  Home/Self Care  In-House Referral:     Discharge planning Services  CM Consult  Post Acute Care Choice:    Choice offered to:     DME Arranged:    DME Agency:     HH Arranged:    HH Agency:     Status of Service:     If discussed at [employer], dates discussed:    Additional Comments:  Maryclare Labrador, RN 09/18/2016, 10:46 AM

## 2016-09-18 NOTE — Care Management Obs Status (Signed)
[facility] NOTIFICATION   Patient Details  Name: Barbara [name] MRN: [id number] Date of Birth: 07-Apr-1950   Medicare Observation Status Notification Given:  Yes    Maryclare Labrador, RN 09/18/2016, 2:09 PM

## 2016-09-19 DIAGNOSIS — H5340 Unspecified visual field defects: Secondary | ICD-10-CM

## 2016-09-19 DIAGNOSIS — R072 Precordial pain: Secondary | ICD-10-CM

## 2016-09-19 DIAGNOSIS — I10 Essential (primary) hypertension: Secondary | ICD-10-CM

## 2016-09-19 NOTE — Discharge Summary (Signed)
Physician Discharge Summary  Barbara [name] RSW:[id number] DOB: [birth year] DOA: 09/16/2016  PCP: Jonathon Jordan, MD  Admit date: 09/16/2016 Discharge date: 09/19/2016   Recommendations for Outpatient Follow-Up:   1. Meningoma on MRI   Discharge Diagnosis:   Principal Problem:   Chest pain Active Problems:   HYPERCHOLESTEROLEMIA   Essential hypertension   Gastroesophageal reflux disease without esophagitis   Bilateral scleritis   Essential tremor   Right facial numbness   Visual field defect   Discharge disposition:  Home.  Discharge Condition: Improved.  Diet recommendation: Low sodium, heart healthy  Wound care: None.   History of Present Illness:   66 y.o.femalewith history of HTN, HLD, GERD, and trigeminal neuralgia who presented w/ substernal chest pressure and diaphoresis which began when she was walking around the house. She was diaphoretic with chest pain.  Patient also c/o numbness of the right cheek for 24hrs.   In December 2017 she had a stress test which was low risk. She had upper GI endoscopy in January 2018, which was normal. Had esophageal manometry as well as pH studies which were also normal.   Hospital Course by Problem:   Substernal chest pressure Troponins unrevealing - pain resolved spontaneously - cardiac cath was very reassuring/unrevealing - symptoms have resolved  Left homonymous visual field disturbances Cardiologystates that this is sometimes experienced post cath  -resolved  Right facial numbness MRI of brain without acute findings - no further complaints at this time - carotid duplex unrevealing  Autoimmune bilateral scleritis Was being treated with methotrexate and prednisone as per records in [place] - has since been instructed to discontinue both these medications  Benign essential tremor Continue beta blocker  GERD  Trigeminal neuralgia Followed by Neurology in the outpatient setting    Medical  Consultants:    cards   Discharge Exam:   Vitals:   09/19/16 0300 09/19/16 0733  BP: 93/65 122/83  Pulse: 65   Resp: 12 (!) 21  Temp: 97.8 F (36.6 C) 97.8 F (36.6 C)   Vitals:   09/18/16 2300 09/18/16 2352 09/19/16 0300 09/19/16 0733  BP: 114/75 114/75 93/65 122/83  Pulse: 76 74 65   Resp: 18 17 12  (!) 21  Temp:  97.6 F (36.4 C) 97.8 F (36.6 C) 97.8 F (36.6 C)  TempSrc:  Oral Oral Oral  SpO2: 97% 97% 93%   Weight:      Height:        Gen:  NAD- vision has completely gone back to normal   The results of significant diagnostics from this hospitalization (including imaging, microbiology, ancillary and laboratory) are listed below for reference.     Procedures and Diagnostic Studies:   Ct Head Wo Contrast  Result Date: 09/16/2016 CLINICAL DATA:  66 y/o  F; right facial numbness. EXAM: CT HEAD WITHOUT CONTRAST TECHNIQUE: Contiguous axial images were obtained from the base of the skull through the vertex without intravenous contrast. COMPARISON:  None. FINDINGS: Brain: No evidence of acute infarction, hemorrhage, hydrocephalus, extra-axial collection or mass lesion/mass effect. Few nonspecific foci of hypoattenuation in subcortical white matter compatible with mild chronic microvascular ischemic changes. Vascular: Mild calcific atherosclerosis of carotid siphons. No hyperdense vessel identified. Skull: Normal. Negative for fracture or focal lesion. Sinuses/Orbits: No acute finding. Other: None. IMPRESSION: 1. No acute intracranial abnormality identified. 2. Mild chronic microvascular ischemic changes of the brain. Electronically Signed   By: Kristine Garbe M.D.   On: 09/16/2016 14:32   Mr [name] KX  Contrast  Result Date: 09/16/2016 CLINICAL DATA:  Initial evaluation for right-sided facial numbness, history of trigeminal neuralgia. EXAM: MRI HEAD WITHOUT AND WITH CONTRAST TECHNIQUE: Multiplanar, multiecho pulse sequences of the brain and surrounding structures  were obtained without and with intravenous contrast. CONTRAST:  84mL MULTIHANCE GADOBENATE DIMEGLUMINE 529 MG/ML IV SOLN COMPARISON:  Prior CT from 09/16/2016. FINDINGS: Brain: Cerebral volume within normal limits for age. Few scattered subcentimeter foci of T2/FLAIR hyperintensity present within the periventricular white matter of both cerebral hemispheres, nonspecific, but mild for age. No other focal parenchymal signal abnormality identified. No abnormal foci of restricted diffusion to suggest acute or subacute ischemia. Gray-white matter differentiation well maintained. No areas of encephalomalacia to suggest chronic infarction. No evidence for acute or chronic intracranial hemorrhage. Small solidly enhancing mass measuring 10 x 8 x 7 mm straddling the anterior falx is most consistent with a small meningioma (series 14, image 40). No associated mass effect. No other mass lesion, mass effect, or midline shift. Ventricles normal size without evidence for hydrocephalus. No extra-axial fluid collection. Major dural sinuses are patent. No abnormal enhancement. Specifically, no abnormal enhancement seen involving or along the course of either trigeminal nerve. [place] are symmetric and within normal limits bilaterally. No findings to suggest vascular compression. Pituitary gland suprasellar region within normal limits. Midline structures intact and normal. Vascular: Major intracranial vascular flow voids are well maintained and normal. Skull and upper cervical spine: Craniocervical junction within normal limits. Visualized upper cervical spine unremarkable. Bone marrow signal intensity within normal limits. No scalp soft tissue abnormality. Sinuses/Orbits: Globes and orbital soft tissues within normal limits. Paranasal sinuses are clear. No mastoid effusion. Inner ear structures normal. IMPRESSION: 1. Mild nonspecific cerebral white matter changes for age. 2. Small 10 x 8 x 7 mm meningioma straddling the  anterior falx without significant mass effect. 3. Otherwise unremarkable and normal brain MRI. No structural findings to explain patient's symptoms identified. Electronically Signed   By: Jeannine Boga M.D.   On: 09/16/2016 23:31   Dg Chest Port 1 View  Result Date: 09/16/2016 CLINICAL DATA:  Midsternal chest pain EXAM: PORTABLE CHEST 1 VIEW COMPARISON:  02/24/2016 FINDINGS: 1206 hours. The lungs are clear without focal pneumonia, edema, pneumothorax or pleural effusion. The cardiopericardial silhouette is within normal limits for size. The visualized bony structures of the thorax are intact. Telemetry leads overlie the chest. IMPRESSION: No active disease. Electronically Signed   By: Misty Stanley M.D.   On: 09/16/2016 12:28     Labs:   Basic Metabolic Panel:  Recent Labs Lab 09/16/16 1215 09/17/16 0808 09/18/16 0318  NA 140 140 138  K 3.6 3.9 3.9  CL 106 107 104  CO2 26 27 29   GLUCOSE 130* 93 101*  BUN 17 14 13   CREATININE 0.83 0.86 0.89  CALCIUM 8.5* 8.4* 8.7*  MG  --  2.2  --    GFR Estimated Creatinine Clearance: 62.8 mL/min (by C-G formula based on SCr of 0.89 mg/dL). Liver Function Tests:  Recent Labs Lab 09/16/16 1215  AST 18  ALT 19  ALKPHOS 56  BILITOT 1.1  PROT 6.5  ALBUMIN 3.6   No results for input(s): LIPASE, AMYLASE in the last 168 hours. No results for input(s): AMMONIA in the last 168 hours. Coagulation profile  Recent Labs Lab 09/16/16 2021  INR 1.09    CBC:  Recent Labs Lab 09/16/16 1215 09/17/16 0243 09/18/16 0318  WBC 14.1* 9.3 7.7  NEUTROABS 7.9*  --   --  HGB 13.2 12.5 12.9  HCT 39.9 39.9 40.6  MCV 89.1 92.4 92.5  PLT 298 260 255   Cardiac Enzymes:  Recent Labs Lab 09/16/16 1358 09/16/16 2021 09/17/16 0243 09/17/16 0808  CKTOTAL 24*  --   --   --   CKMB 1.7  --   --   --   TROPONINI <0.03 <0.03 <0.03 <0.03   BNP: Invalid input(s): POCBNP CBG: No results for input(s): GLUCAP in the last 168  hours. D-Dimer  Recent Labs  09/17/16 0808  DDIMER 0.36   Hgb A1c  Recent Labs  09/17/16 0243  HGBA1C 6.2*   Lipid Profile  Recent Labs  09/17/16 0243  CHOL 184  HDL 44  LDLCALC 116*  TRIG 119  CHOLHDL 4.2   Thyroid function studies No results for input(s): TSH, T4TOTAL, T3FREE, THYROIDAB in the last 72 hours.  Invalid input(s): FREET3 Anemia work up No results for input(s): VITAMINB12, FOLATE, FERRITIN, TIBC, IRON, RETICCTPCT in the last 72 hours. Microbiology Recent Results (from the past 240 hour(s))  MRSA PCR Screening     Status: None   Collection Time: 09/16/16  3:50 PM  Result Value Ref Range Status   MRSA by PCR NEGATIVE NEGATIVE Final    Comment:        The GeneXpert MRSA Assay (FDA approved for NASAL specimens only), is one component of a comprehensive MRSA colonization surveillance program. It is not intended to diagnose MRSA infection nor to guide or monitor treatment for MRSA infections.      Discharge Instructions:   Discharge Instructions    Diet - low sodium heart healthy    Complete by:  As directed    Increase activity slowly    Complete by:  As directed      Allergies as of 09/19/2016      Reactions   Ace Inhibitors    Pt unsure- nausea or hives   Amoxicillin Hives   Celebrex [celecoxib] Other (See Comments)   Severe stomach cramps   Enalapril Other (See Comments)   congestion   Fentanyl Nausea And Vomiting   Fish Oil Other (See Comments)   Broke out with blisters   Gabapentin    Pain and swelling    Lanolin Acid [lanolin]    rash   Nickel    rash   Other Nausea And Vomiting   Narcotic Pain Killers- vomitting, nausea constantly   Penicillins Hives   Has patient had a PCN reaction causing immediate rash, facial/tongue/throat swelling, SOB or lightheadedness with hypotension: unknown Has patient had a PCN reaction causing severe rash involving mucus membranes or skin necrosis: no Has patient had a PCN reaction that  required hospitalization - no, was at MD office Has patient had a PCN reaction occurring within the last 10 years: no If all of the above answers are "NO", then may proceed with Cephalosporin use.   Propoxyphene Hcl Nausea Only   Sulfonamide Derivatives Nausea Only, Other (See Comments)   Stomach cramping   Synvisc [hylan G-f 20]    Made knee swell up at injection site      Medication List    STOP taking these medications   methotrexate 2.5 MG tablet Commonly known as:  RHEUMATREX   predniSONE 10 MG tablet Commonly known as:  DELTASONE     TAKE these medications   ALIGN 4 MG Caps Take 1 capsule by mouth daily.   aspirin EC 81 MG tablet Take 81 mg by mouth at bedtime.  carboxymethylcellulose 0.5 % Soln Commonly known as:  REFRESH PLUS Place 1 drop into both eyes 2 (two) times daily.   folic acid 1 MG tablet Commonly known as:  FOLVITE Take 1 mg by mouth daily.   levocetirizine 5 MG tablet Commonly known as:  XYZAL Take 5 mg by mouth daily as needed for allergies.   milk thistle 175 MG tablet Take 175 mg by mouth daily.   omeprazole 40 MG capsule Commonly known as:  PRILOSEC Take 1 capsule (40 mg total) by mouth 2 (two) times daily.   prednisoLONE acetate 1 % ophthalmic suspension Commonly known as:  PRED FORTE Place 1 drop into both eyes daily.   propranolol 10 MG tablet Commonly known as:  INDERAL Take 10 mg by mouth 2 (two) times daily.   psyllium 95 % Pack Commonly known as:  HYDROCIL/METAMUCIL Take 1 packet by mouth 2 (two) times daily.   Red Yeast Rice Extract 300 MG Caps Take 600 mg by mouth daily.   SYSTANE NIGHTTIME Oint Apply to eye as needed.      Follow-up Information    Jonathon Jordan, MD Follow up in 1 week(s).   Specialty:  Family Medicine Contact information: [place] [address] [phone]        Fay Records, MD Follow up today.   Specialty:  Cardiology Why:  as needed Contact  information: [place] [address] [phone]            Time coordinating discharge: 35 min  Signed:  JESSICA Alison Stalling   [facility] 09/19/2016, 8:22 AM

## 2016-09-19 NOTE — Progress Notes (Signed)
Discussed and explained discharge instructions, prescriptions and follow up appts. Given to patient. Pt going home with husband with belongings.

## 2016-09-19 NOTE — Discharge Instructions (Signed)
Acute Coronary Syndrome °Acute coronary syndrome (ACS) is a serious problem in which there is suddenly not enough blood and oxygen supplied to the heart. ACS may mean that one or more of the blood vessels in your heart (coronary arteries) may be blocked. ACS can result in chest pain or a heart attack (myocardial infarction or MI). °What are the causes? °This condition is caused by atherosclerosis, which is the buildup of fat and cholesterol (plaque) on the inside of the arteries. Over time, the plaque may narrow or block the artery, and this will lessen blood flow to the heart. Plaque can also become weak and break off within a coronary artery to form a clot and cause a sudden blockage. °What increases the risk? °The risk factors of this condition include: °· High cholesterol levels. °· High blood pressure (hypertension). °· Smoking. °· Diabetes. °· Age. °· Family history of chest pain, heart disease, or stroke. °· Lack of exercise. °What are the signs or symptoms? °The most common signs of this condition include: °· Chest pain, which can be: °¨ A crushing or squeezing in the chest. °¨ A tightness, pressure, fullness, or heaviness in the chest. °¨ Present for more than a few minutes, or it can stop and recur. °· Pain in the arms, neck, jaw, or back. °· Unexplained heartburn or indigestion. °· Shortness of breath. °· Nausea. °· Sudden cold sweats. °· Feeling light-headed or dizzy. °Sometimes, this condition has no symptoms. °How is this diagnosed? °ACS may be diagnosed through the following tests: °· Electrocardiogram (ECG). °· Blood tests. °· Coronary angiogram. This is a procedure to look at the coronary arteries to see if there is any blockage. °How is this treated? °Treatment for ACS may include: °· Healthy behavioral changes to reduce or control risk factors. °· Medicine. °· Coronary stenting. A stent helps to keep an artery open. °· Coronary angioplasty. This procedure widens a narrowed or blocked  artery. °· Coronary artery bypass surgery. This will allow your blood to pass the blockage (bypass) to reach your heart. °Follow these instructions at home: °Eating and drinking °· Follow a heart-healthy diet. A dietitian can you help to educate you about healthy food options and changes. °· Use healthy cooking methods such as roasting, grilling, broiling, baking, poaching, steaming, or stir-frying. Talk to a dietitian to learn more about healthy cooking methods. °Medicines °· Take medicines only as directed by your health care provider. °· Do not take the following medicines unless your health care provider approves: °¨ Nonsteroidal anti-inflammatory drugs (NSAIDs), such as ibuprofen, naproxen, or celecoxib. °¨ Vitamin supplements that contain vitamin A, vitamin E, or both. °¨ Hormone replacement therapy that contains estrogen with or without progestin. °· Stop illegal drug use. °Activity °· Follow an exercise program that is approved by your health care provider. °· Plan rest periods when you are fatigued. °Lifestyle °· Do not use any tobacco products, including cigarettes, chewing tobacco, or electronic cigarettes. If you need help quitting, ask your health care provider. °· If you drink alcohol, and your health care provider approves, limit your alcohol intake to no more than 1 drink per day. One drink equals 12 ounces of beer, 5 ounces of wine, or 1½ ounces of hard liquor. °· Learn to manage stress. °· Maintain a healthy weight. Lose weight as approved by your health care provider. °General instructions °· Manage other health conditions, such as hypertension and diabetes, as directed by your health care provider. °· Keep all follow-up visits as directed by your   health care provider. This is important. °· Your health care provider may ask you to monitor your blood pressure. A blood pressure reading consists of a higher number over a lower number, such as 110 over 72, written as 110/72. Ideally, your blood  pressure should be: °¨ Below 140/90 if you have no other medical conditions. °¨ Below 130/80 if you have diabetes or kidney disease. °Get help right away if: °· You have pain in your chest, neck, arm, jaw, stomach, or back that lasts more than a few minutes, is recurring, or is not relieved by taking medicine under your tongue (sublingual nitroglycerin). °· You have profuse sweating without cause. °· You have unexplained: °¨ Heartburn or indigestion. °¨ Shortness of breath or difficulty breathing. °¨ Nausea or vomiting. °¨ Fatigue. °¨ Feelings of nervousness or anxiety. °¨ Weakness. °¨ Diarrhea. °· You have sudden light-headedness or dizziness. °· You faint. °These symptoms may represent a serious problem that is an emergency. Do not wait to see if the symptoms will go away. Get medical help right away. Call your local emergency services (911 in the [place] Do not drive yourself to the clinic or hospital.  °This information is not intended to replace advice given to you by your health care provider. Make sure you discuss any questions you have with your health care provider. °Document Released: 02/27/2005 Document Revised: 08/11/2015 Document Reviewed: 07/01/2013 °[employer] Interactive Patient Education © 2017 [employer] ° °

## 2016-09-20 MED FILL — Fentanyl Citrate Preservative Free (PF) Inj 100 MCG/2ML: INTRAMUSCULAR | Qty: 2 | Status: AC

## 2016-12-07 ENCOUNTER — Emergency Department (HOSPITAL_COMMUNITY)
Admission: EM | Admit: 2016-12-07 | Discharge: 2016-12-07 | Disposition: A | Discharge status: Home / Self Care | Payer: Medicare Other | Attending: Emergency Medicine | Admitting: Emergency Medicine

## 2016-12-07 ENCOUNTER — Encounter (HOSPITAL_COMMUNITY): Payer: Self-pay | Admitting: Emergency Medicine

## 2016-12-07 DIAGNOSIS — I97621 Postprocedural hematoma of a circulatory system organ or structure following other procedure: Secondary | ICD-10-CM | POA: Insufficient documentation

## 2016-12-07 DIAGNOSIS — Z7982 Long term (current) use of aspirin: Secondary | ICD-10-CM | POA: Diagnosis not present

## 2016-12-07 DIAGNOSIS — I10 Essential (primary) hypertension: Secondary | ICD-10-CM | POA: Diagnosis not present

## 2016-12-07 DIAGNOSIS — M25552 Pain in left hip: Secondary | ICD-10-CM | POA: Diagnosis present

## 2016-12-07 DIAGNOSIS — Z85828 Personal history of other malignant neoplasm of skin: Secondary | ICD-10-CM | POA: Diagnosis not present

## 2016-12-07 MED ORDER — ACETAMINOPHEN 325 MG PO TABS
650.0000 mg | ORAL_TABLET | Freq: Once | ORAL | Status: AC
  Administered 2016-12-07: 650 mg via ORAL
  Filled 2016-12-07: qty 2

## 2016-12-07 NOTE — ED Provider Notes (Signed)
[facility] DEPT Provider Note   CSN: [id number] Arrival date & time: 12/07/16  1955     History   Chief Complaint Chief Complaint  Patient presents with  . Post-op Problem    HPI Barbara [name] is a 66 y.o. female.  HPI   Patient is a 66 year old female with a history of an undetermined autoimmune disorder presenting status post a surgical removal of a pain in her left femur earlier this morning for surgical site bleeding. Patient reports that she had minimal bleeding all day until around 7pm on 12-07-2016 when she noted that she began to soak through her surgical site bandage with blood. Patient and her husband attempted direct compression to the site before calling EMS. Patient was ambulatory today mostly to use the bathroom. Patient denies any surgical site pain, however reports that her left leg is beginning to ache. Patient denies any headaches, chest pain, nausea or vomiting. Patient reports that she has had other episodes in the past the surgical site bleeding such as after a Procedure, however has no history of platelet or coagulation disorders.  Past Medical History:  Diagnosis Date  . Anxiety   . Arthritis    knees  . Cancer ([place])    basal cell- nose  . GERD (gastroesophageal reflux disease)   . Hyperlipidemia   . Hypertension   . Neuromuscular disorder ([place])    benign tremor- takes Propanolol  . PONV (postoperative nausea and vomiting)    "BP DROPS ALSO"  . Trigeminal neuralgia of right side of face     Patient Active Problem List   Diagnosis Date Noted  . Visual field defect   . Chest pain 09/16/2016  . Essential tremor 09/16/2016  . Right facial numbness 09/16/2016  . Bilateral scleritis 08/29/2016  . Morbid obesity due to excess calories ([place]) 06/01/2016  . Dyspnea on exertion 05/13/2016  . Hoarseness 03/17/2016  . Gastroesophageal reflux disease without esophagitis 03/17/2016  . Globus sensation 03/17/2016  . Gastritis 02/25/2016  . Atypical chest  pain 02/24/2016  . Painful orthopaedic hardware ([place]) 04/15/2013  . VITAMIN D DEFICIENCY 12/09/2009  . HYPERCHOLESTEROLEMIA 12/09/2009  . Essential hypertension 12/09/2009  . Allergic rhinitis 12/09/2009  . NEPHROLITHIASIS 12/09/2009  . CERVICAL POLYP 12/09/2009  . OSTEOARTHRITIS 12/09/2009  . Cough variant asthma vs UACS  12/09/2009    Past Surgical History:  Procedure Laterality Date  . [place] STUDY N/A 04/17/2016   Procedure: [place] STUDY;  Surgeon: Manus Gunning, MD;  Location: [facility];  Service: Gastroenterology;  Laterality: N/A;  . ESOPHAGEAL MANOMETRY N/A 04/17/2016   Procedure: ESOPHAGEAL MANOMETRY (EM);  Surgeon: Manus Gunning, MD;  Location: [facility];  Service: Gastroenterology;  Laterality: N/A;  . fractured femur  05/2012   left - rod -screws placed  . HARDWARE REMOVAL Left 04/16/2013   Procedure: REMOVAL OF HARDWARE OF LEFT KNEE (DISTAL INTERLOC SCREW);  Surgeon: Gearlean Alf, MD;  Location: [facility];  Service: Orthopedics;  Laterality: Left;  . KNEE SURGERY     x 3 left / x1 rt knee  . LEFT HEART CATH AND CORONARY ANGIOGRAPHY N/A 09/18/2016   Procedure: Left Heart Cath and Coronary Angiography;  Surgeon: Sherren Mocha, MD;  Location: [facility];  Service: Cardiovascular;  Laterality: N/A;  . NASAL SINUS SURGERY     2000  . OTHER SURGICAL HISTORY  2014   titanium rod in left femur used for fracture repair  . SHOULDER SURGERY  bilateral shoulders   . WISDOM TOOTH EXTRACTION      OB History    No data available       Home Medications    Prior to Admission medications   Medication Sig Start Date End Date Taking? Authorizing Provider  aspirin EC 81 MG tablet Take 81 mg by mouth at bedtime.     [provider]  carboxymethylcellulose (REFRESH PLUS) 0.5 % SOLN Place 1 drop into both eyes 2 (two) times daily.     [provider]  folic acid (FOLVITE) 1 MG tablet Take 1 mg by mouth daily. 08/29/16    [provider]  levocetirizine (XYZAL) 5 MG tablet Take 5 mg by mouth daily as needed for allergies.     [provider]  milk thistle 175 MG tablet Take 175 mg by mouth daily.    [provider]  omeprazole (PRILOSEC) 40 MG capsule Take 1 capsule (40 mg total) by mouth 2 (two) times daily. 07/28/16   Tanda Rockers, MD  prednisoLONE acetate (PRED FORTE) 1 % ophthalmic suspension Place 1 drop into both eyes daily.     [provider]  Probiotic Product (ALIGN) 4 MG CAPS Take 1 capsule by mouth daily.    [provider]  propranolol (INDERAL) 10 MG tablet Take 10 mg by mouth 2 (two) times daily.     [provider]  psyllium (HYDROCIL/METAMUCIL) 95 % PACK Take 1 packet by mouth 2 (two) times daily.    [provider]  Red Yeast Rice Extract 300 MG CAPS Take 600 mg by mouth daily.    [provider]  White Petrolatum-Mineral Oil (SYSTANE NIGHTTIME) OINT Apply to eye as needed.    [provider]    Family History Family History  Problem Relation Age of Onset  . Stomach cancer Mother   . Heart disease Mother 81       First MI early 29s, CABG   . Lung cancer Father        smoked  . Allergies Father   . Heart attack Father 74  . Brain cancer Brother   . Colon cancer Neg Hx   . Esophageal cancer Neg Hx   . Rectal cancer Neg Hx     Social History Social History  Substance Use Topics  . Smoking status: Never Smoker  . Smokeless tobacco: Never Used  . Alcohol use Yes     Comment: RARE     Allergies   Ace inhibitors; Amoxicillin; Celebrex [celecoxib]; Enalapril; Fentanyl; Fish oil; Gabapentin; Lanolin acid [lanolin]; Nickel; Other; Penicillins; Propoxyphene hcl; Sulfonamide derivatives; and Synvisc [hylan g-f 20]   Review of Systems Review of Systems  Gastrointestinal: Negative for abdominal pain, nausea and vomiting.  Musculoskeletal:       Pain in left leg.  Skin: Positive for wound.    Neurological: Negative for headaches.  Hematological: Bruises/bleeds easily.     Physical Exam Updated Vital Signs BP (!) 142/85 (BP Location: Left Arm)   Pulse 90   Temp 97.9 F (36.6 C) (Oral)   Resp 20   SpO2 94%   Physical Exam  Constitutional: She appears well-developed and well-nourished. No distress.  Sitting comfortably in bed.  HENT:  Head: Normocephalic and atraumatic.  Eyes: Conjunctivae are normal. Right eye exhibits no discharge. Left eye exhibits no discharge.  EOMs normal to gross examination.  Neck: Normal range of motion.  Cardiovascular: Normal rate, regular rhythm, normal heart sounds and intact distal pulses.  Intact, 2+ dorsalis pedis pulses bilaterally.  Pulmonary/Chest: Effort normal and breath sounds normal.  Normal respiratory effort. Patient converses comfortably. No audible wheeze or stridor.  Abdominal: She exhibits no distension.  Musculoskeletal: Normal range of motion.  Neurological: She is alert.  Cranial nerves intact to gross observation. Patient moves extremities with good coordination.  Skin: Skin is warm and dry. She is not diaphoretic.  Surgical incision approximately 3 cm long with intact running suture in left groin. No active bleeding on exam. Subcutaneous hematoma present. No erythema of surgical wound.  Psychiatric: She has a normal mood and affect. Her behavior is normal. Judgment and thought content normal.  Nursing note and vitals reviewed.    ED Treatments / Results  Labs (all labs ordered are listed, but only abnormal results are displayed) Labs Reviewed - No data to display  EKG  EKG Interpretation None       Radiology No results found.  Procedures Procedures (including critical care time)  Medications Ordered in ED Medications - No data to display   Initial Impression / Assessment and Plan / ED Course  I have reviewed the triage vital signs and the nursing notes.  Pertinent labs & imaging results that  were available during my care of the patient were reviewed by me and considered in my medical decision making (see chart for details).  Clinical Course as of Dec 08 2346  Thu Dec 07, 2016  2226 Patient seen and evaluated. Patient well-appearing and in no active bleeding at this time. Suspect venous bleeding. Will apply saline bag to postop site. Discussed case with Dr. Theotis Burrow.  [AM]    Clinical Course User Index [AM] Albesa Seen, PA-C   Final Clinical Impressions(s) / ED Diagnoses   Final diagnoses:  None   MDM  Patient is a 66 year old female with a history of an undetermined autoimmune disorder presenting status post a surgical removal of a pain in her left femur earlier this morning for surgical site bleeding. Patient well-appearing and in no acute distress. Patient not bleeding on presentation emergency department. Saline bag applied over surgical site wound for 20-30 minutes. Compression dressing reapplied there appeared to be no additional bleeding. Appropriate return depressions given for additional bleeding, surgical site erythema, or purulent drainage. Patient is understanding and agrees with plan of care.  This is a supervised visit with Dr. Theotis Burrow. Evaluation, management, and discharge planning discussed with this attending physician.  New Prescriptions New Prescriptions   No medications on file     Tamala Julian 12/07/16 2353    Little, Wenda Overland, MD 12/09/16 1201

## 2016-12-07 NOTE — Discharge Instructions (Signed)
Please see the information and instructions below regarding your visit.  Your diagnoses today include:  1. Postoperative hematoma involving circulatory system following non-circulatory system procedure    You likely had some venous bleeding underneath your surgical site. It is very reassuring that it is resolved.  Tests performed today include: See side panel of your discharge paperwork for testing performed today. Vital signs are listed at the bottom of these instructions.   Medications prescribed:    Take any prescribed medications only as prescribed, and any over the counter medications only as directed on the packaging.  Home care instructions:  Please follow any educational materials contained in this packet.   Please keep the compression dressing on your wound for at least the next 48 hours unless he sees signs of blood seeping. You may apply ice packs on top of the dressing for comfort and to help hematoma.  Follow-up instructions: Please follow-up with your surgical office per their recommendations for further evaluation of your symptoms if they are not completely improved.    Return instructions:  Please return to the Emergency Department if you experience worsening symptoms.  Please return the emergency Department if you have any worsening bleeding, redness around the surgical incision site, or pus draining out of the wound. Please return if you have any other emergent concerns.  Additional Information:   Your vital signs today were: BP (!) 142/85 (BP Location: Left Arm)    Pulse 90    Temp 97.9 F (36.6 C) (Oral)    Resp 20    SpO2 94%  If your blood pressure (BP) was elevated on multiple readings during this visit above 130 for the top number or above 80 for the bottom number, please have this repeated by your primary care provider within one month. --------------  Thank you for allowing [place] to participate in your care today. It was a pleasure taking care of you  today!

## 2016-12-07 NOTE — ED Notes (Addendum)
Per PA, saturated dressing was removed from surgical incision site and was replaced with a new non adherent dressing. A saline bag was used to put pressure on the wound. Bleeding appears to have stopped

## 2016-12-07 NOTE — ED Triage Notes (Signed)
Pt from home via EMS following surgery in upper left thigh/ pelvic area. Surgery, which was to remove hardware from previous (5 years ago) femur fx to which pt had developed an immune reaction to. Surgery was at 0930 this morning. Pt has discoloration to scleras of eyes which is a symptom of her immune reaction to hardware. Pt is here because incision site began bleeding around 1845. Area bandaged by EMS. Pt able to ambulate to bathroom.

## 2017-01-25 ENCOUNTER — Encounter: Payer: Self-pay | Admitting: Neurology

## 2017-01-25 ENCOUNTER — Other Ambulatory Visit: Payer: Self-pay | Admitting: Family Medicine

## 2017-01-25 ENCOUNTER — Ambulatory Visit: Payer: Medicare Other | Admitting: Neurology

## 2017-01-25 ENCOUNTER — Other Ambulatory Visit (HOSPITAL_COMMUNITY)
Admission: RE | Admit: 2017-01-25 | Discharge: 2017-01-25 | Disposition: A | Discharge status: Home / Self Care | Payer: Medicare Other | Source: Ambulatory Visit | Attending: Family Medicine | Admitting: Family Medicine

## 2017-01-25 VITALS — BP 161/86 | HR 67 | Ht 62.0 in | Wt 186.8 lb

## 2017-01-25 DIAGNOSIS — Z01411 Encounter for gynecological examination (general) (routine) with abnormal findings: Secondary | ICD-10-CM | POA: Insufficient documentation

## 2017-01-25 DIAGNOSIS — G250 Essential tremor: Secondary | ICD-10-CM | POA: Diagnosis not present

## 2017-01-25 DIAGNOSIS — R51 Headache: Secondary | ICD-10-CM

## 2017-01-25 DIAGNOSIS — R519 Headache, unspecified: Secondary | ICD-10-CM | POA: Insufficient documentation

## 2017-01-25 NOTE — Progress Notes (Addendum)
PATIENT: Barbara [name] DOB: 11/03/1950  Chief Complaint  Patient presents with  . Meningioma    She is here to follow up from her MRI findings of meningioma in July 2018.  She has been hypertensive recently and in the last week has been experiencing daily headaches.  Normally, she estimates having one headache per week.  She typically uses ibuprofen for pain relief.  . Tremors    She uses propranolol for the tremors in her bilateral hands.  Marland Kitchen PCP    Jonathon Jordan, MD     HISTORICAL  Barbara [name] is a 67 year old female, seen in refer by her primary care doctor  Jonathon Jordan for evaluation of meningioma, tremor, initial evaluation was on January 25 2017.  I reviewed and summarized the referring note, she has history of hypertension, hyperlipidemia, allergies, endometrial polyps, shoulder surgery, knee surgery, sinus surgery, fractured of left femur  She was diagnosed with diffuse anterior bilateral nuclear sclerosis, was treated by [facility] ophthalmologist Dr. Manuella Ghazi, had to round of steroid tapering, the first round was in July 2018 last about 3 weeks, most recent 3 weeks treatment in October 2018.  Since her steroid treatment in July, she complains of frequent headaches, later noted it was associated with elevated blood pressure, she also noticed worsening headache after finishing steroid tapering, bilateral frontal pressure pain,  She could not tolerate steroid sparing agent methotrexate or Imuran,  She presented to the emergency room in July 2018 because of headache, transient left-sided face numbness, this has led to MRI of the brain, with personally reviewed MRI of the brain, there was an time 8 times 7 mm meningioma, no significant mass effect,  Her headache overall has much improved, she has history of bilateral hands essential tremor, helped by Inderal 10 mg twice a day, today's blood pressure was elevated 161/86, heart rate of 67   REVIEW OF SYSTEMS: Full 14 system  review of systems performed and notable only for as above  ALLERGIES: Allergies  Allergen Reactions  . Ace Inhibitors     Pt unsure- nausea or hives  . Amoxicillin Hives  . Azathioprine Other (See Comments)    "cardiac issues"  . Celebrex [Celecoxib] Other (See Comments)    Severe stomach cramps  . Enalapril Other (See Comments)    congestion  . Fentanyl Nausea And Vomiting  . Fish Oil Other (See Comments)    Broke out with blisters  . Gabapentin     Pain and swelling   . Lanolin Acid [Lanolin]     rash  . Nickel     rash  . Other Nausea And Vomiting    Narcotic Pain Killers- vomitting, nausea constantly  . Penicillins Hives    Has patient had a PCN reaction causing immediate rash, facial/tongue/throat swelling, SOB or lightheadedness with hypotension: unknown Has patient had a PCN reaction causing severe rash involving mucus membranes or skin necrosis: no Has patient had a PCN reaction that required hospitalization - no, was at MD office Has patient had a PCN reaction occurring within the last 10 years: no If all of the above answers are "NO", then may proceed with Cephalosporin use.   . Prednisone Other (See Comments)    High blood pressure.  . Propoxyphene Hcl Nausea Only  . Sulfonamide Derivatives Nausea Only and Other (See Comments)    Stomach cramping  . Synvisc [Hylan G-F 20]     Made knee swell up at injection site    HOME MEDICATIONS:  Current Outpatient Medications  Medication Sig Dispense Refill  . aspirin EC 81 MG tablet Take 81 mg by mouth at bedtime.     . carboxymethylcellulose (REFRESH PLUS) 0.5 % SOLN Place 1 drop into both eyes 2 (two) times daily.     . folic acid (FOLVITE) 1 MG tablet Take 1 mg by mouth daily.  3  . levocetirizine (XYZAL) 5 MG tablet Take 5 mg by mouth daily as needed for allergies.     . milk thistle 175 MG tablet Take 175 mg by mouth daily.    Marland Kitchen omeprazole (PRILOSEC) 40 MG capsule Take 1 capsule (40 mg total) by mouth 2 (two)  times daily.    . prednisoLONE acetate (PRED FORTE) 1 % ophthalmic suspension Place 1 drop into both eyes daily.     . Probiotic Product (ALIGN) 4 MG CAPS Take 1 capsule by mouth daily.    . propranolol (INDERAL) 10 MG tablet Take 10 mg by mouth 2 (two) times daily.     . psyllium (HYDROCIL/METAMUCIL) 95 % PACK Take 1 packet by mouth 2 (two) times daily.    . Red Yeast Rice Extract 300 MG CAPS Take 600 mg by mouth daily.    Dema Severin Petrolatum-Mineral Oil (SYSTANE NIGHTTIME) OINT Apply to eye as needed.     Current Facility-Administered Medications  Medication Dose Route Frequency Provider Last Rate Last Dose  . 0.9 %  sodium chloride infusion  500 mL Intravenous Continuous Armbruster, [name], MD        PAST MEDICAL HISTORY: Past Medical History:  Diagnosis Date  . Anxiety   . Arthritis    knees  . Cancer ([place])    basal cell- nose  . GERD (gastroesophageal reflux disease)   . Hyperlipidemia   . Hypertension   . Meningioma ([place])   . Neuromuscular disorder ([place])    benign tremor- takes Propanolol  . PONV (postoperative nausea and vomiting)    "BP DROPS ALSO"  . Trigeminal neuralgia of right side of face     PAST SURGICAL HISTORY: Past Surgical History:  Procedure Laterality Date  . [place] STUDY N/A 04/17/2016   Procedure: [place] STUDY;  Surgeon: Manus Gunning, MD;  [facility];  Service: Gastroenterology;  Laterality: N/A;  . ESOPHAGEAL MANOMETRY N/A 04/17/2016   Procedure: ESOPHAGEAL MANOMETRY (EM);  Surgeon: Manus Gunning, MD;  [facility];  Service: Gastroenterology;  Laterality: N/A;  . fractured femur  05/2012   left - rod -screws placed  . HARDWARE REMOVAL Left 04/16/2013   Procedure: REMOVAL OF HARDWARE OF LEFT KNEE (DISTAL INTERLOC SCREW);  Surgeon: Gearlean Alf, MD;  [facility];  Service: Orthopedics;  Laterality: Left;  . KNEE SURGERY     x 3 left / x1 rt knee  . LEFT HEART CATH AND CORONARY ANGIOGRAPHY N/A  09/18/2016   Procedure: Left Heart Cath and Coronary Angiography;  Surgeon: Sherren Mocha, MD;  Location: [facility];  Service: Cardiovascular;  Laterality: N/A;  . NASAL SINUS SURGERY     2000  . OTHER SURGICAL HISTORY  2014   titanium rod in left femur used for fracture repair  . SHOULDER SURGERY     bilateral shoulders   . WISDOM TOOTH EXTRACTION      FAMILY HISTORY: Family History  Problem Relation Age of Onset  . Stomach cancer Mother   . Heart disease Mother 61       First MI early 64s,  CABG   . Lung cancer Father        smoked  . Allergies Father   . Heart attack Father 31  . Brain cancer Brother   . Colon cancer Neg Hx   . Esophageal cancer Neg Hx   . Rectal cancer Neg Hx     SOCIAL HISTORY:  Social History   Socioeconomic History  . Marital status: Married    Spouse name: Not on file  . Number of children: 2  . Years of education: college  . Highest education level: Bachelor's degree (e.g., BA, AB, BS)  Social Needs  . Financial resource strain: Not on file  . Food insecurity - worry: Not on file  . Food insecurity - inability: Not on file  . Transportation needs - medical: Not on file  . Transportation needs - non-medical: Not on file  Occupational History  . Occupation: retired Pharmacist, hospital  Tobacco Use  . Smoking status: Never Smoker  . Smokeless tobacco: Never Used  Substance and Sexual Activity  . Alcohol use: Yes    Comment: RARE  . Drug use: No  . Sexual activity: Not on file  Other Topics Concern  . Not on file  Social History Narrative   Lives at home at home with husband.    Right-handed.   No caffeine use.     PHYSICAL EXAM   Vitals:   01/25/17 0923  BP: (!) 161/86  Pulse: 67  Weight: 186 lb 12 oz (84.7 kg)  Height: 5' 2" (1.575 m)    Not recorded      Body mass index is 34.16 kg/m.  PHYSICAL EXAMNIATION:  Gen: NAD, conversant, well nourised, obese, well groomed                     Cardiovascular: Regular rate  rhythm, no peripheral edema, warm, nontender. Eyes: Conjunctivae clear without exudates or hemorrhage Neck: Supple, no carotid bruits. Pulmonary: Clear to auscultation bilaterally   NEUROLOGICAL EXAM:  MENTAL STATUS: Speech:    Speech is normal; fluent and spontaneous with normal comprehension.  Cognition:     Orientation to time, place and person     Normal recent and remote memory     Normal Attention span and concentration     Normal Language, naming, repeating,spontaneous speech     Fund of knowledge   CRANIAL NERVES: CN II: Visual fields are full to confrontation. Fundoscopic exam is normal with sharp discs and no vascular changes. Pupils are round equal and briskly reactive to light. CN III, IV, VI: extraocular movement are normal. No ptosis. CN V: Facial sensation is intact to pinprick in all 3 divisions bilaterally. Corneal responses are intact.  CN VII: Face is symmetric with normal eye closure and smile. CN VIII: Hearing is normal to rubbing fingers CN IX, X: Palate elevates symmetrically. Phonation is normal. CN XI: Head turning and shoulder shrug are intact CN XII: Tongue is midline with normal movements and no atrophy.  MOTOR: There is no pronator drift of out-stretched arms. Muscle bulk and tone are normal. Muscle strength is normal.  REFLEXES: Reflexes are 2+ and symmetric at the biceps, triceps, knees, and ankles. Plantar responses are flexor.  SENSORY: Intact to light touch, pinprick, positional sensation and vibratory sensation are intact in fingers and toes.  COORDINATION: Rapid alternating movements and fine finger movements are intact. There is no dysmetria on finger-to-nose and heel-knee-shin.    GAIT/STANCE: Posture is normal. Gait is steady with normal steps,  base, arm swing, and turning. Heel and toe walking are normal. Tandem gait is normal.  Romberg is absent.   DIAGNOSTIC DATA (LABS, IMAGING, TESTING) - I reviewed patient records, labs, notes,  testing and imaging myself where available.   ASSESSMENT AND PLAN  Barbara [name] is a 66 y.o. female   Intermittent headache that is associated with steroid treatment, and elevated blood pressure, overall has improved,  This is not related to the incidental finding of small anterior falx meningioma, which has no side effect,  She may take as needed Tylenol or ibuprofen,  Normal ESR C-reactive protein, no evidence of temporal arteritis Essential tremor, elevated blood pressure  She will benefit higher dose of Inderal treatment, please also repeat TSH,   Marcial Pacas, M.D. Ph.D.  [facility] [address], [place], [place] [address] Ph: [phone] Fax: [phone]  CC: Jonathon Jordan, MD

## 2017-01-25 NOTE — Addendum Note (Signed)
Addended by: Marcial Pacas on: 01/25/2017 11:05 AM   Modules accepted: Level of Service

## 2017-01-26 ENCOUNTER — Ambulatory Visit: Payer: Medicare Other | Admitting: Internal Medicine

## 2017-01-26 ENCOUNTER — Encounter: Payer: Self-pay | Admitting: Internal Medicine

## 2017-01-26 VITALS — BP 132/72 | HR 80 | Ht 62.0 in | Wt 187.0 lb

## 2017-01-26 DIAGNOSIS — I10 Essential (primary) hypertension: Secondary | ICD-10-CM

## 2017-01-26 DIAGNOSIS — E782 Mixed hyperlipidemia: Secondary | ICD-10-CM | POA: Diagnosis not present

## 2017-01-26 DIAGNOSIS — R072 Precordial pain: Secondary | ICD-10-CM | POA: Diagnosis not present

## 2017-01-26 MED ORDER — AMLODIPINE BESYLATE 2.5 MG PO TABS: 2.5000 mg | ORAL_TABLET | Freq: Every day | ORAL | 11 refills | Status: DC

## 2017-01-26 NOTE — Progress Notes (Signed)
Cardiology Office Note   Date:  01/26/2017   ID:  MEKO BELLANGER, DOB 08-Feb-1951, MRN [id number]  PCP:  Jonathon Jordan, MD  Cardiologist:   Dorris Carnes, MD   F/U of Chest pain   History of Present Illness: Barbara [name] is a 66 y.o. female with a history of HTN, HL, GERD   Admitted to [facility] with CP  Trop negative  CXR with thoracic atherosclerosis  Echo:  LVEF normal  Gr II diastolic dysfunction    Hosp this summer with CP  Ended up getting cath  Normal coronary arteries  Normal pressuress  Attrib symptoms to meds  (prednisone, MTX)  Stopped prednisone cold [place]  Had HA  Went to ED     BP was all over the place   130s to 170 ss  Since seen she has remained active   Chest has been doing good but yesterday with frustration Could feel tightness in chest   Current Meds  Medication Sig  . carboxymethylcellulose (REFRESH PLUS) 0.5 % SOLN Place 1 drop into both eyes 2 (two) times daily.   Marland Kitchen omeprazole (PRILOSEC) 40 MG capsule Take 1 capsule (40 mg total) by mouth 2 (two) times daily.   Current Facility-Administered Medications for the 01/26/17 encounter (Office Visit) with Fay Records, MD  Medication  . 0.9 %  sodium chloride infusion     Allergies:   Ace inhibitors; Amoxicillin; Azathioprine; Celebrex [celecoxib]; Enalapril; Fentanyl; Fish oil; Gabapentin; Lanolin acid [lanolin]; Levofloxacin; Methotrexate derivatives; Nickel; Other; Penicillins; Prednisone; Propoxyphene hcl; Sulfonamide derivatives; and Synvisc [hylan g-f 20]   Past Medical History:  Diagnosis Date  . Anxiety   . Arthritis    knees  . Cancer ([place])    basal cell- nose  . GERD (gastroesophageal reflux disease)   . Hyperlipidemia   . Hypertension   . Meningioma ([place])   . Neuromuscular disorder ([place])    benign tremor- takes Propanolol  . PONV (postoperative nausea and vomiting)    "BP DROPS ALSO"  . Trigeminal neuralgia of right side of face     Past Surgical History:  Procedure Laterality Date   . [place] STUDY N/A 04/17/2016   Performed by Manus Gunning, MD at [place]  . ESOPHAGEAL MANOMETRY (EM) N/A 04/17/2016   Performed by Manus Gunning, MD at [place]  . fractured femur  05/2012   left - rod -screws placed  . KNEE SURGERY     x 3 left / x1 rt knee  . Left Heart Cath and Coronary Angiography N/A 09/18/2016   Performed by Sherren Mocha, MD at [place]  . NASAL SINUS SURGERY     2000  . OTHER SURGICAL HISTORY  2014   titanium rod in left femur used for fracture repair  . REMOVAL OF HARDWARE OF LEFT KNEE (DISTAL INTERLOC SCREW) Left 04/16/2013   Performed by Gearlean Alf, MD at [facility]  . SHOULDER SURGERY     bilateral shoulders   . WISDOM TOOTH EXTRACTION       Social History:  The patient  reports that  has never smoked. she has never used smokeless tobacco. She reports that she drinks alcohol. She reports that she does not use drugs.   Family History:  The patient's family history includes Allergies in her father; Brain cancer in her brother; Heart attack (age of onset: 66) in her father; Heart disease (age of onset: 56) in her mother; Lung cancer  in her father; Stomach cancer in her mother.    ROS:  Please see the history of present illness. All other systems are reviewed and  Negative to the above problem except as noted.    PHYSICAL EXAM: VS:  BP 132/72   Pulse 80   Ht 5\' 2"  (1.575 m)   Wt 187 lb (84.8 kg)   SpO2 98%   BMI 34.20 kg/m   GEN: Well nourished, well developed, in no acute distress  HEENT: normal  Neck:  JVP normal  No carotid bruits, or masses Cardiac: RRR; no murmurs, rubs, or gallops,no edema  Respiratory:  clear to auscultation bilaterally, normal work of breathing GI: soft, nontender, nondistended, + BS  No hepatomegaly  MS: no deformity Moving all extremities   Skin: warm and dry, no rash Neuro:  Strength and sensation are intact Psych: euthymic mood, full affect   EKG:  EKG is not ordered  today.   Lipid Panel    Component Value Date/Time   CHOL 184 09/17/2016 0243   TRIG 119 09/17/2016 0243   HDL 44 09/17/2016 0243   CHOLHDL 4.2 09/17/2016 0243   VLDL 24 09/17/2016 0243   LDLCALC 116 (H) 09/17/2016 0243      Wt Readings from Last 3 Encounters:  01/26/17 187 lb (84.8 kg)  01/25/17 186 lb 12 oz (84.7 kg)  09/16/16 186 lb 14.4 oz (84.8 kg)      ASSESSMENT AND PLAN:  1  Chest pressure  Rare  Cath normal  2  HTN  Labile  Will add amlodipine 2.5  Conitinue inderal 10 bid F/u in January  3  HL  Currently on red yeast rice  LDL is not optimal  Will review on next visit  She is sensitive to many meds   Cath  Without dz.     Current medicines are reviewed at length with the patient today.  The patient does not have concerns regarding medicines.  Signed, Dorris Carnes, MD  01/26/2017 12:10 PM    [facility] [place], [place], [place]  [address] Phone: [phone]; Fax: [phone]

## 2017-01-26 NOTE — Patient Instructions (Signed)
Your physician has recommended you make the following change in your medication:  1.) start amlodipine (Norvasc) 2.5 mg daily  Your physician recommends that you schedule a follow-up appointment in: January with Dr. Harrington Challenger.   Bring your list of blood pressures with you.

## 2017-01-29 LAB — CYTOLOGY - PAP: Diagnosis: NEGATIVE

## 2017-01-30 ENCOUNTER — Telehealth: Payer: Self-pay | Admitting: Internal Medicine

## 2017-01-30 NOTE — Telephone Encounter (Signed)
I would give a little time   Call in 10 days

## 2017-01-30 NOTE — Telephone Encounter (Signed)
New Message  Pt c/o medication issue:  1. Name of Medication: Amlodipine  2. How are you currently taking this medication (dosage and times per day)? 2.5mg    3. Are you having a reaction (difficulty breathing--STAT)? Ankle swelling   4. What is your medication issue? Pt would like to discuss the swelling. And would like to know if she needs to take her medication .please call back to discuss

## 2017-01-30 NOTE — Telephone Encounter (Signed)
Patient started amlodipine 2.5 mg Saturday.  Yesterday started swelling in ankles.  Wend down overnight, still little puffy today.   Advised to hold amlodipine today, will forward to Dr. Harrington Challenger for further recommendations.  BP Sat and on Sun AM was 140s-150/.  Sunday night and yesterday 131/80, 131/79.  Hasn't checked today.  She is hoping her BP was affected by the prednisone she was taking and that it will normalize.  Pt has f/u in January.   Aware I will call her back after Dr. Harrington Challenger reviews.

## 2017-01-31 NOTE — Telephone Encounter (Signed)
130/75-80 yesterday evening.  She wants to try taking for 5 more days and call instead of 10.  The swelling made it painful to walk.   Advised her to call next week with update on swelling and BPs.  She will continue amlodipine 2.5 mg daily until she calls with update.

## 2017-03-28 ENCOUNTER — Inpatient Hospital Stay (HOSPITAL_COMMUNITY)
Admission: EM | Admit: 2017-03-28 | Discharge: 2017-03-31 | DRG: 493 | Disposition: A | Discharge status: Skilled Nursing Facility | Payer: Medicare Other | Attending: Orthopedic Surgery | Admitting: Orthopedic Surgery

## 2017-03-28 ENCOUNTER — Encounter (HOSPITAL_COMMUNITY)
Admission: EM | Disposition: A | Discharge status: Skilled Nursing Facility | Payer: Self-pay | Source: Home / Self Care | Attending: Orthopedic Surgery

## 2017-03-28 ENCOUNTER — Inpatient Hospital Stay (HOSPITAL_COMMUNITY): Payer: Medicare Other | Admitting: Anesthesiology

## 2017-03-28 ENCOUNTER — Emergency Department (HOSPITAL_COMMUNITY): Payer: Medicare Other

## 2017-03-28 ENCOUNTER — Inpatient Hospital Stay (HOSPITAL_COMMUNITY): Payer: Medicare Other

## 2017-03-28 ENCOUNTER — Encounter (HOSPITAL_COMMUNITY): Payer: Self-pay | Admitting: Emergency Medicine

## 2017-03-28 DIAGNOSIS — M170 Bilateral primary osteoarthritis of knee: Secondary | ICD-10-CM | POA: Diagnosis present

## 2017-03-28 DIAGNOSIS — Z85828 Personal history of other malignant neoplasm of skin: Secondary | ICD-10-CM | POA: Diagnosis not present

## 2017-03-28 DIAGNOSIS — Z801 Family history of malignant neoplasm of trachea, bronchus and lung: Secondary | ICD-10-CM | POA: Diagnosis not present

## 2017-03-28 DIAGNOSIS — W19XXXA Unspecified fall, initial encounter: Secondary | ICD-10-CM

## 2017-03-28 DIAGNOSIS — Z419 Encounter for procedure for purposes other than remedying health state, unspecified: Secondary | ICD-10-CM

## 2017-03-28 DIAGNOSIS — Z808 Family history of malignant neoplasm of other organs or systems: Secondary | ICD-10-CM | POA: Diagnosis not present

## 2017-03-28 DIAGNOSIS — Z87442 Personal history of urinary calculi: Secondary | ICD-10-CM

## 2017-03-28 DIAGNOSIS — Z888 Allergy status to other drugs, medicaments and biological substances status: Secondary | ICD-10-CM | POA: Diagnosis not present

## 2017-03-28 DIAGNOSIS — I10 Essential (primary) hypertension: Secondary | ICD-10-CM | POA: Diagnosis present

## 2017-03-28 DIAGNOSIS — M79605 Pain in left leg: Secondary | ICD-10-CM | POA: Diagnosis present

## 2017-03-28 DIAGNOSIS — D62 Acute posthemorrhagic anemia: Secondary | ICD-10-CM | POA: Diagnosis not present

## 2017-03-28 DIAGNOSIS — S82142A Displaced bicondylar fracture of left tibia, initial encounter for closed fracture: Principal | ICD-10-CM | POA: Diagnosis present

## 2017-03-28 DIAGNOSIS — Z882 Allergy status to sulfonamides status: Secondary | ICD-10-CM

## 2017-03-28 DIAGNOSIS — S82202A Unspecified fracture of shaft of left tibia, initial encounter for closed fracture: Secondary | ICD-10-CM | POA: Diagnosis present

## 2017-03-28 DIAGNOSIS — G250 Essential tremor: Secondary | ICD-10-CM | POA: Diagnosis present

## 2017-03-28 DIAGNOSIS — Z880 Allergy status to penicillin: Secondary | ICD-10-CM | POA: Diagnosis not present

## 2017-03-28 DIAGNOSIS — K219 Gastro-esophageal reflux disease without esophagitis: Secondary | ICD-10-CM | POA: Diagnosis present

## 2017-03-28 DIAGNOSIS — Z885 Allergy status to narcotic agent status: Secondary | ICD-10-CM

## 2017-03-28 DIAGNOSIS — R110 Nausea: Secondary | ICD-10-CM | POA: Diagnosis not present

## 2017-03-28 DIAGNOSIS — E785 Hyperlipidemia, unspecified: Secondary | ICD-10-CM | POA: Diagnosis present

## 2017-03-28 DIAGNOSIS — Z79899 Other long term (current) drug therapy: Secondary | ICD-10-CM

## 2017-03-28 DIAGNOSIS — Z6834 Body mass index (BMI) 34.0-34.9, adult: Secondary | ICD-10-CM | POA: Diagnosis not present

## 2017-03-28 DIAGNOSIS — Z881 Allergy status to other antibiotic agents status: Secondary | ICD-10-CM

## 2017-03-28 DIAGNOSIS — T428X5A Adverse effect of antiparkinsonism drugs and other central muscle-tone depressants, initial encounter: Secondary | ICD-10-CM | POA: Diagnosis not present

## 2017-03-28 DIAGNOSIS — Z91048 Other nonmedicinal substance allergy status: Secondary | ICD-10-CM | POA: Diagnosis not present

## 2017-03-28 DIAGNOSIS — Z800 Family history of malignant neoplasm of digestive organs: Secondary | ICD-10-CM | POA: Diagnosis not present

## 2017-03-28 DIAGNOSIS — Y92009 Unspecified place in unspecified non-institutional (private) residence as the place of occurrence of the external cause: Secondary | ICD-10-CM | POA: Diagnosis not present

## 2017-03-28 DIAGNOSIS — E559 Vitamin D deficiency, unspecified: Secondary | ICD-10-CM | POA: Diagnosis present

## 2017-03-28 DIAGNOSIS — Z8249 Family history of ischemic heart disease and other diseases of the circulatory system: Secondary | ICD-10-CM

## 2017-03-28 DIAGNOSIS — E669 Obesity, unspecified: Secondary | ICD-10-CM

## 2017-03-28 DIAGNOSIS — W109XXA Fall (on) (from) unspecified stairs and steps, initial encounter: Secondary | ICD-10-CM | POA: Diagnosis present

## 2017-03-28 DIAGNOSIS — Z9109 Other allergy status, other than to drugs and biological substances: Secondary | ICD-10-CM | POA: Diagnosis present

## 2017-03-28 DIAGNOSIS — S82252A Displaced comminuted fracture of shaft of left tibia, initial encounter for closed fracture: Secondary | ICD-10-CM

## 2017-03-28 HISTORY — PX: EXTERNAL FIXATION LEG: SHX1549

## 2017-03-28 HISTORY — DX: Other allergy status, other than to drugs and biological substances: Z91.09

## 2017-03-28 LAB — BASIC METABOLIC PANEL
Anion gap: 9 (ref 5–15)
BUN: 13 mg/dL (ref 6–20)
CO2: 22 mmol/L (ref 22–32)
Calcium: 8 mg/dL — ABNORMAL LOW (ref 8.9–10.3)
Chloride: 108 mmol/L (ref 101–111)
Creatinine, Ser: 0.7 mg/dL (ref 0.44–1.00)
GFR calc Af Amer: >60 mL/min (ref >60)
GFR calc non Af Amer: >60 mL/min (ref >60)
Glucose, Bld: 132 mg/dL — ABNORMAL HIGH (ref 65–99)
Potassium: 3.7 mmol/L (ref 3.5–5.1)
Sodium: 139 mmol/L (ref 135–145)

## 2017-03-28 LAB — CBC WITH DIFFERENTIAL/PLATELET
Basophils Absolute: 0 10*3/uL (ref 0.0–0.1)
Basophils Relative: 0 %
Eosinophils Absolute: 0.1 10*3/uL (ref 0.0–0.7)
Eosinophils Relative: 1 %
HCT: 34.7 % — ABNORMAL LOW (ref 36.0–46.0)
Hemoglobin: 11.4 g/dL — ABNORMAL LOW (ref 12.0–15.0)
Lymphocytes Relative: 21 %
Lymphs Abs: 2 10*3/uL (ref 0.7–4.0)
MCH: 28.9 pg (ref 26.0–34.0)
MCHC: 32.9 g/dL (ref 30.0–36.0)
MCV: 87.8 fL (ref 78.0–100.0)
Monocytes Absolute: 0.6 10*3/uL (ref 0.1–1.0)
Monocytes Relative: 6 %
Neutro Abs: 6.7 10*3/uL (ref 1.7–7.7)
Neutrophils Relative %: 72 %
Platelets: 236 10*3/uL (ref 150–400)
RBC: 3.95 MIL/uL (ref 3.87–5.11)
RDW: 13.3 % (ref 11.5–15.5)
WBC: 9.4 10*3/uL (ref 4.0–10.5)

## 2017-03-28 LAB — SURGICAL PCR SCREEN
MRSA, PCR: NEGATIVE
Staphylococcus aureus: NEGATIVE

## 2017-03-28 SURGERY — EXTERNAL FIXATION, LOWER EXTREMITY
Anesthesia: Spinal | Laterality: Left

## 2017-03-28 MED ORDER — PROPOFOL 10 MG/ML IV BOLUS
INTRAVENOUS | Status: AC
  Filled 2017-03-28: qty 20

## 2017-03-28 MED ORDER — PANTOPRAZOLE SODIUM 40 MG PO TBEC
40.0000 mg | DELAYED_RELEASE_TABLET | Freq: Every day | ORAL | Status: DC
Start: 2017-03-28 — End: 2017-03-31
  Administered 2017-03-28 – 2017-03-31 (×4): 40 mg via ORAL
  Filled 2017-03-28 (×4): qty 1

## 2017-03-28 MED ORDER — PHENYLEPHRINE HCL 10 MG/ML IJ SOLN
INTRAMUSCULAR | Status: DC | PRN
  Administered 2017-03-28 (×5): 80 ug via INTRAVENOUS

## 2017-03-28 MED ORDER — HYDROMORPHONE HCL 1 MG/ML IJ SOLN: 1.0000 mg | INTRAMUSCULAR | Status: DC | PRN

## 2017-03-28 MED ORDER — ONDANSETRON HCL 4 MG/2ML IJ SOLN
INTRAMUSCULAR | Status: DC | PRN
  Administered 2017-03-28: 4 mg via INTRAVENOUS

## 2017-03-28 MED ORDER — TRAMADOL HCL 50 MG PO TABS
50.0000 mg | ORAL_TABLET | Freq: Four times a day (QID) | ORAL | Status: DC | PRN
  Administered 2017-03-28 – 2017-03-29 (×3): 50 mg via ORAL
  Filled 2017-03-28 (×4): qty 1

## 2017-03-28 MED ORDER — LACTATED RINGERS IV SOLN
INTRAVENOUS | Status: DC
  Administered 2017-03-29: 05:00:00 via INTRAVENOUS

## 2017-03-28 MED ORDER — AMLODIPINE BESYLATE 2.5 MG PO TABS
2.5000 mg | ORAL_TABLET | Freq: Every day | ORAL | Status: DC
  Administered 2017-03-28 – 2017-03-31 (×4): 2.5 mg via ORAL
  Filled 2017-03-28 (×4): qty 1

## 2017-03-28 MED ORDER — ACETAMINOPHEN 325 MG PO TABS
650.0000 mg | ORAL_TABLET | ORAL | Status: DC | PRN
  Administered 2017-03-28: 650 mg via ORAL
  Filled 2017-03-28: qty 2

## 2017-03-28 MED ORDER — PHENYLEPHRINE HCL 10 MG/ML IJ SOLN
INTRAVENOUS | Status: DC | PRN
  Administered 2017-03-28: 30 ug/min via INTRAVENOUS

## 2017-03-28 MED ORDER — LORATADINE 10 MG PO TABS: 10.0000 mg | ORAL_TABLET | Freq: Every day | ORAL | Status: DC | PRN

## 2017-03-28 MED ORDER — PROPOFOL 10 MG/ML IV BOLUS
INTRAVENOUS | Status: DC | PRN
  Administered 2017-03-28: 20 mg via INTRAVENOUS

## 2017-03-28 MED ORDER — PROPOFOL 500 MG/50ML IV EMUL
INTRAVENOUS | Status: DC | PRN
  Administered 2017-03-28: 45 ug/kg/min via INTRAVENOUS

## 2017-03-28 MED ORDER — ACETAMINOPHEN 10 MG/ML IV SOLN
INTRAVENOUS | Status: AC
  Administered 2017-03-28: 1000 mg via INTRAVENOUS
  Filled 2017-03-28: qty 100

## 2017-03-28 MED ORDER — ONDANSETRON HCL 4 MG/2ML IJ SOLN
4.0000 mg | Freq: Once | INTRAMUSCULAR | Status: AC
  Administered 2017-03-28: 4 mg via INTRAVENOUS
  Filled 2017-03-28: qty 2

## 2017-03-28 MED ORDER — ONDANSETRON HCL 4 MG PO TABS
4.0000 mg | ORAL_TABLET | Freq: Four times a day (QID) | ORAL | Status: DC | PRN
Start: 2017-03-28 — End: 2017-03-31

## 2017-03-28 MED ORDER — KETOROLAC TROMETHAMINE 15 MG/ML IJ SOLN: 7.5000 mg | Freq: Four times a day (QID) | INTRAMUSCULAR | Status: DC | PRN

## 2017-03-28 MED ORDER — MORPHINE SULFATE (PF) 4 MG/ML IV SOLN: 4.0000 mg | Freq: Once | INTRAVENOUS | Status: DC

## 2017-03-28 MED ORDER — LACTATED RINGERS IV SOLN
INTRAVENOUS | Status: DC
  Administered 2017-03-28: 75 mL/h via INTRAVENOUS

## 2017-03-28 MED ORDER — METOCLOPRAMIDE HCL 5 MG/ML IJ SOLN: 10.0000 mg | Freq: Once | INTRAMUSCULAR | Status: DC | PRN

## 2017-03-28 MED ORDER — DIPHENHYDRAMINE HCL 50 MG/ML IJ SOLN
INTRAMUSCULAR | Status: DC | PRN
  Administered 2017-03-28: 25 mg via INTRAVENOUS

## 2017-03-28 MED ORDER — ACETAMINOPHEN 325 MG PO TABS
650.0000 mg | ORAL_TABLET | Freq: Once | ORAL | Status: AC
  Administered 2017-03-28: 650 mg via ORAL
  Filled 2017-03-28: qty 2

## 2017-03-28 MED ORDER — MIDAZOLAM HCL 2 MG/2ML IJ SOLN
INTRAMUSCULAR | Status: AC
  Filled 2017-03-28: qty 2

## 2017-03-28 MED ORDER — FOLIC ACID 1 MG PO TABS
1.0000 mg | ORAL_TABLET | Freq: Every day | ORAL | Status: DC
  Administered 2017-03-28 – 2017-03-31 (×4): 1 mg via ORAL
  Filled 2017-03-28 (×4): qty 1

## 2017-03-28 MED ORDER — SENNA 8.6 MG PO TABS
1.0000 | ORAL_TABLET | Freq: Two times a day (BID) | ORAL | Status: DC
  Administered 2017-03-30 – 2017-03-31 (×3): 8.6 mg via ORAL
  Filled 2017-03-28 (×4): qty 1

## 2017-03-28 MED ORDER — ENOXAPARIN SODIUM 40 MG/0.4ML ~~LOC~~ SOLN
40.0000 mg | SUBCUTANEOUS | Status: DC
  Administered 2017-03-29 – 2017-03-31 (×3): 40 mg via SUBCUTANEOUS
  Filled 2017-03-28 (×3): qty 0.4

## 2017-03-28 MED ORDER — ACETAMINOPHEN 10 MG/ML IV SOLN
1000.0000 mg | Freq: Once | INTRAVENOUS | Status: AC
  Administered 2017-03-28: 1000 mg via INTRAVENOUS

## 2017-03-28 MED ORDER — DOCUSATE SODIUM 100 MG PO CAPS
100.0000 mg | ORAL_CAPSULE | Freq: Two times a day (BID) | ORAL | Status: DC
  Administered 2017-03-30 – 2017-03-31 (×3): 100 mg via ORAL
  Filled 2017-03-28 (×4): qty 1

## 2017-03-28 MED ORDER — LIDOCAINE HCL (CARDIAC) 20 MG/ML IV SOLN
INTRAVENOUS | Status: DC | PRN
  Administered 2017-03-28: 40 mg via INTRATRACHEAL

## 2017-03-28 MED ORDER — METHOCARBAMOL 500 MG PO TABS
500.0000 mg | ORAL_TABLET | Freq: Four times a day (QID) | ORAL | Status: DC | PRN
  Administered 2017-03-29: 500 mg via ORAL
  Filled 2017-03-28: qty 1

## 2017-03-28 MED ORDER — LEVOCETIRIZINE DIHYDROCHLORIDE 5 MG PO TABS: 5.0000 mg | ORAL_TABLET | Freq: Every day | ORAL | Status: DC | PRN

## 2017-03-28 MED ORDER — FENTANYL CITRATE (PF) 100 MCG/2ML IJ SOLN
50.0000 ug | Freq: Once | INTRAMUSCULAR | Status: AC
  Administered 2017-03-28: 50 ug via INTRAVENOUS
  Filled 2017-03-28: qty 2

## 2017-03-28 MED ORDER — LACTATED RINGERS IV SOLN: INTRAVENOUS | Status: DC

## 2017-03-28 MED ORDER — FENTANYL CITRATE (PF) 250 MCG/5ML IJ SOLN
INTRAMUSCULAR | Status: AC
  Filled 2017-03-28: qty 5

## 2017-03-28 MED ORDER — VANCOMYCIN HCL IN DEXTROSE 1-5 GM/200ML-% IV SOLN
1000.0000 mg | Freq: Two times a day (BID) | INTRAVENOUS | Status: AC
  Administered 2017-03-28: 1000 mg via INTRAVENOUS
  Filled 2017-03-28: qty 200

## 2017-03-28 MED ORDER — METOCLOPRAMIDE HCL 5 MG/ML IJ SOLN: 5.0000 mg | Freq: Three times a day (TID) | INTRAMUSCULAR | Status: DC | PRN

## 2017-03-28 MED ORDER — SODIUM CHLORIDE 0.9 % IR SOLN
Status: DC | PRN
  Administered 2017-03-28: 1000 mL

## 2017-03-28 MED ORDER — PROPRANOLOL HCL 20 MG PO TABS
10.0000 mg | ORAL_TABLET | Freq: Two times a day (BID) | ORAL | Status: DC
  Administered 2017-03-28 – 2017-03-31 (×5): 10 mg via ORAL
  Filled 2017-03-28 (×6): qty 1

## 2017-03-28 MED ORDER — POLYETHYLENE GLYCOL 3350 17 G PO PACK: 17.0000 g | PACK | Freq: Every day | ORAL | Status: DC | PRN

## 2017-03-28 MED ORDER — DIPHENHYDRAMINE HCL 12.5 MG/5ML PO ELIX
12.5000 mg | ORAL_SOLUTION | ORAL | Status: DC | PRN
Start: 2017-03-28 — End: 2017-03-31

## 2017-03-28 MED ORDER — ACETAMINOPHEN 650 MG RE SUPP: 650.0000 mg | RECTAL | Status: DC | PRN

## 2017-03-28 MED ORDER — SORBITOL 70 % SOLN: 30.0000 mL | Freq: Every day | Status: DC | PRN

## 2017-03-28 MED ORDER — CHLORHEXIDINE GLUCONATE 4 % EX LIQD
60.0000 mL | Freq: Once | CUTANEOUS | Status: DC
  Filled 2017-03-28: qty 60

## 2017-03-28 MED ORDER — PHENYLEPHRINE 40 MCG/ML (10ML) SYRINGE FOR IV PUSH (FOR BLOOD PRESSURE SUPPORT)
PREFILLED_SYRINGE | INTRAVENOUS | Status: AC
  Filled 2017-03-28: qty 10

## 2017-03-28 MED ORDER — ACETAMINOPHEN 500 MG PO TABS
1000.0000 mg | ORAL_TABLET | Freq: Three times a day (TID) | ORAL | Status: DC
  Administered 2017-03-28 – 2017-03-31 (×9): 1000 mg via ORAL
  Filled 2017-03-28 (×10): qty 2

## 2017-03-28 MED ORDER — BUPIVACAINE IN DEXTROSE 0.75-8.25 % IT SOLN
INTRATHECAL | Status: DC | PRN
  Administered 2017-03-28: 13.5 mg via INTRATHECAL

## 2017-03-28 MED ORDER — METHOCARBAMOL 1000 MG/10ML IJ SOLN
500.0000 mg | Freq: Four times a day (QID) | INTRAVENOUS | Status: DC | PRN
  Filled 2017-03-28: qty 5

## 2017-03-28 MED ORDER — ONDANSETRON HCL 4 MG/2ML IJ SOLN
4.0000 mg | Freq: Four times a day (QID) | INTRAMUSCULAR | Status: DC | PRN
  Administered 2017-03-29 (×2): 4 mg via INTRAVENOUS
  Filled 2017-03-28 (×2): qty 2

## 2017-03-28 MED ORDER — ACETAMINOPHEN 500 MG PO TABS
1000.0000 mg | ORAL_TABLET | Freq: Once | ORAL | Status: AC
  Administered 2017-03-28: 1000 mg via ORAL
  Filled 2017-03-28: qty 2

## 2017-03-28 MED ORDER — PSYLLIUM 95 % PO PACK
1.0000 | PACK | Freq: Two times a day (BID) | ORAL | Status: DC
  Administered 2017-03-28 – 2017-03-31 (×4): 1 via ORAL
  Filled 2017-03-28 (×6): qty 1

## 2017-03-28 MED ORDER — VANCOMYCIN HCL IN DEXTROSE 1-5 GM/200ML-% IV SOLN
1000.0000 mg | INTRAVENOUS | Status: AC
  Administered 2017-03-28: 1000 mg via INTRAVENOUS
  Filled 2017-03-28: qty 200

## 2017-03-28 MED ORDER — METOCLOPRAMIDE HCL 5 MG PO TABS: 5.0000 mg | ORAL_TABLET | Freq: Three times a day (TID) | ORAL | Status: DC | PRN

## 2017-03-28 MED ORDER — PROPOFOL 1000 MG/100ML IV EMUL
INTRAVENOUS | Status: AC
  Filled 2017-03-28: qty 100

## 2017-03-28 SURGICAL SUPPLY — 68 items
5 HOLE PIN CLAMP ×2 IMPLANT
APEX PIN ×12 IMPLANT
BANDAGE ACE 4X5 VEL STRL LF (GAUZE/BANDAGES/DRESSINGS) ×3 IMPLANT
BANDAGE ACE 6X5 VEL STRL LF (GAUZE/BANDAGES/DRESSINGS) ×3 IMPLANT
BANDAGE ELASTIC 6 VELCRO ST LF (GAUZE/BANDAGES/DRESSINGS) ×2 IMPLANT
BANDAGE ESMARK 6X9 LF (GAUZE/BANDAGES/DRESSINGS) ×1 IMPLANT
BIT DRILL CANN 2.7 (BIT) ×2
BIT DRILL CANN 2.7MM (BIT) ×1
BIT DRILL SRG 2.7XCANN AO CPLG (BIT) IMPLANT
BIT DRL SRG 2.7XCANN AO CPLNG (BIT) ×1
BNDG CMPR 9X6 STRL LF SNTH (GAUZE/BANDAGES/DRESSINGS) ×1
BNDG COHESIVE 6X5 TAN STRL LF (GAUZE/BANDAGES/DRESSINGS) IMPLANT
BNDG ESMARK 6X9 LF (GAUZE/BANDAGES/DRESSINGS) ×3
BNDG GAUZE ELAST 4 BULKY (GAUZE/BANDAGES/DRESSINGS) ×4 IMPLANT
BRUSH SCRUB SURG 4.25 DISP (MISCELLANEOUS) ×2 IMPLANT
CAP EXTERNAL F/PIN ×2 IMPLANT
COVER SURGICAL LIGHT HANDLE (MISCELLANEOUS) ×6 IMPLANT
DRAPE C-ARM 42X72 X-RAY (DRAPES) ×3 IMPLANT
DRAPE C-ARMOR (DRAPES) ×3 IMPLANT
DRAPE HALF SHEET 40X57 (DRAPES) ×2 IMPLANT
DRAPE U-SHAPE 47X51 STRL (DRAPES) ×3 IMPLANT
ELECT CAUTERY BLADE 6.4 (BLADE) ×2 IMPLANT
ELECT REM PT RETURN 9FT ADLT (ELECTROSURGICAL) ×3
ELECTRODE REM PT RTRN 9FT ADLT (ELECTROSURGICAL) ×1 IMPLANT
GAUZE SPONGE 4X4 12PLY STRL (GAUZE/BANDAGES/DRESSINGS) ×3 IMPLANT
GAUZE XEROFORM 1X8 LF (GAUZE/BANDAGES/DRESSINGS) ×2 IMPLANT
GAUZE XEROFORM 5X9 LF (GAUZE/BANDAGES/DRESSINGS) ×3 IMPLANT
GLOVE BIO SURGEON STRL SZ7.5 (GLOVE) ×3 IMPLANT
GLOVE BIO SURGEON STRL SZ8 (GLOVE) ×3 IMPLANT
GLOVE BIOGEL PI IND STRL 8 (GLOVE) ×2 IMPLANT
GLOVE BIOGEL PI INDICATOR 8 (GLOVE) ×4
GOWN STRL REUS W/ TWL LRG LVL3 (GOWN DISPOSABLE) ×2 IMPLANT
GOWN STRL REUS W/ TWL XL LVL3 (GOWN DISPOSABLE) ×1 IMPLANT
GOWN STRL REUS W/TWL LRG LVL3 (GOWN DISPOSABLE) ×6
GOWN STRL REUS W/TWL XL LVL3 (GOWN DISPOSABLE) ×9
HYDROSET 10CC (Ophthalmic Related) ×2 IMPLANT
K-WIRE ORTHOPEDIC 1.4X150L (WIRE) ×3
KIT BASIN OR (CUSTOM PROCEDURE TRAY) ×3 IMPLANT
KIT ROOM TURNOVER OR (KITS) ×3 IMPLANT
KWIRE ORTHOPEDIC 1.4X150L (WIRE) IMPLANT
MANIFOLD NEPTUNE II (INSTRUMENTS) ×3 IMPLANT
NS IRRIG 1000ML POUR BTL (IV SOLUTION) ×3 IMPLANT
PACK ORTHO EXTREMITY (CUSTOM PROCEDURE TRAY) ×3 IMPLANT
PAD ARMBOARD 7.5X6 YLW CONV (MISCELLANEOUS) ×6 IMPLANT
PADDING CAST COTTON 6X4 STRL (CAST SUPPLIES) ×6 IMPLANT
PIN APEX 180X50X5XST SLF (PIN) IMPLANT
PIN APEX 5X150 (EXFIX) ×4 IMPLANT
PIN APEX 5X180 (PIN) ×6
PIN CLAMP 5H 30DEG POST (EXFIX) ×1 IMPLANT
PIN TO ROD ×4 IMPLANT
PIN TO ROD COUPLING INVERT (EXFIX) ×8 IMPLANT
ROD ×4 IMPLANT
ROD HOFFMANN3 CONNECT 11X300 (EXFIX) ×2 IMPLANT
ROD TIBIA SEMI CRC CVD 11X220 (EXFIX) ×1 IMPLANT
ROD TO ROD ×4 IMPLANT
ROD TO ROD COUPLING EXFIX (EXFIX) ×8 IMPLANT
SCREW 70X4.0MM (Screw) ×2 IMPLANT
SEMI CIRCULAR ROD ×2 IMPLANT
SPONGE LAP 18X18 X RAY DECT (DISPOSABLE) IMPLANT
STOCKINETTE IMPERVIOUS LG (DRAPES) IMPLANT
SUCTION FRAZIER HANDLE 10FR (MISCELLANEOUS) ×2
SUCTION TUBE FRAZIER 10FR DISP (MISCELLANEOUS) IMPLANT
SUT ETHILON 3 0 PS 1 (SUTURE) ×4 IMPLANT
TOWEL OR 17X26 10 PK STRL BLUE (TOWEL DISPOSABLE) ×6 IMPLANT
TUBE CONNECTING 12'X1/4 (SUCTIONS) ×1
TUBE CONNECTING 12X1/4 (SUCTIONS) ×1 IMPLANT
UNDERPAD 30X30 (UNDERPADS AND DIAPERS) ×3 IMPLANT
YANKAUER SUCT BULB TIP NO VENT (SUCTIONS) ×2 IMPLANT

## 2017-03-28 NOTE — Anesthesia Postprocedure Evaluation (Signed)
Anesthesia Post Note  Patient: Barbara [name]  Procedure(s) Performed: EXTERNAL FIXATION LEG (Left )     Patient location during evaluation: PACU Anesthesia Type: Spinal and Regional Level of consciousness: awake and alert, patient cooperative and oriented Pain management: pain level controlled Vital Signs Assessment: post-procedure vital signs reviewed and stable Respiratory status: spontaneous breathing, nonlabored ventilation, respiratory function stable and patient connected to nasal cannula oxygen Cardiovascular status: blood pressure returned to baseline and stable Postop Assessment: spinal receding and no apparent nausea or vomiting Anesthetic complications: no    Last Vitals:  Vitals:   03/28/17 1100 03/28/17 1500  BP:  127/72  Pulse: 94 93  Resp:  16  Temp:  36.5 C  SpO2: 94% 100%    Last Pain:  Vitals:   03/28/17 1500  TempSrc:   PainSc: 0-No pain                 Mikahla Wisor,E. Lanissa Cashen

## 2017-03-28 NOTE — ED Notes (Signed)
Pt to xray at this time.

## 2017-03-28 NOTE — H&P (Signed)
ORTHOPAEDIC CONSULTATION  REQUESTING PHYSICIAN: Horton, Barbette Hair, MD  Chief Complaint: Left leg pain from fall.  Assessment / Plan: Principal Problem:   Closed left tibial fracture Active Problems:   Essential hypertension  Closed left tibial shaft, plateau fracture The patient appears to be medically stable.  Pain is fairly well controlled with Tylenol.  She has intolerance to narcotic medicines.  The patient has had significant allergies to hardware and resolution after removal including IM nail.  Therefore, the patient would like to avoid traditional surgical repair, IM nail, ORIF.  We discussed splinting/casting versus ORIF, IM nail, external fixation along with the details risks and benefits of same at length.    Patient would like to proceed with external fixation, possible small mini open lateral incision likely with screw/washer, possible cementing.  The risks benefits and alternatives of the procedure were discussed with the patient including but not limited to infection, bleeding, nerve injury, the need for revision surgery, blood clots, cardiopulmonary complications, morbidity, mortality, among others.  The patient verbalizes understanding and wishes to proceed.     N.p.o. Maintain knee immobilizer Weight Bearing Status: Non Weight Bearing (NWB) LLE VTE px: SCD's  HPI: Barbara [name] is a 67 y.o. female who complains of left leg pain and inability to bear weight after she slipped on the bottom 2 stairs at her home/missed a step.  She presented to the emergency department where x-rays showed closed fractures of her lateral tibial plateau and tibia shaft.  Orthopedics was consulted for evaluation.   Today her pain is controlled with Tylenol.  Last meal last night. Denies history of MI, CVA, DVT, PE.  Previously ambulatory without aid.    Past Medical History:  Diagnosis Date  . Anxiety   . Arthritis    knees  . Cancer ([place])    basal cell- nose  . GERD  (gastroesophageal reflux disease)   . Hyperlipidemia   . Hypertension   . Meningioma ([place])   . Neuromuscular disorder ([place])    benign tremor- takes Propanolol  . PONV (postoperative nausea and vomiting)    "BP DROPS ALSO"  . Trigeminal neuralgia of right side of face    Past Surgical History:  Procedure Laterality Date  . [place] STUDY N/A 04/17/2016   Procedure: [place] STUDY;  Surgeon: Manus Gunning, MD;  Location: [facility];  Service: Gastroenterology;  Laterality: N/A;  . ESOPHAGEAL MANOMETRY N/A 04/17/2016   Procedure: ESOPHAGEAL MANOMETRY (EM);  Surgeon: Manus Gunning, MD;  Location: [facility];  Service: Gastroenterology;  Laterality: N/A;  . fractured femur  05/2012   left - rod -screws placed  . HARDWARE REMOVAL Left 04/16/2013   Procedure: REMOVAL OF HARDWARE OF LEFT KNEE (DISTAL INTERLOC SCREW);  Surgeon: Gearlean Alf, MD;  Location: [facility];  Service: Orthopedics;  Laterality: Left;  . KNEE SURGERY     x 3 left / x1 rt knee  . LEFT HEART CATH AND CORONARY ANGIOGRAPHY N/A 09/18/2016   Procedure: Left Heart Cath and Coronary Angiography;  Surgeon: Sherren Mocha, MD;  Location: [facility];  Service: Cardiovascular;  Laterality: N/A;  . NASAL SINUS SURGERY     2000  . OTHER SURGICAL HISTORY  2014   titanium rod in left femur used for fracture repair  . SHOULDER SURGERY     bilateral shoulders   . WISDOM TOOTH EXTRACTION     Social History   Socioeconomic History  . Marital status:  Married    Spouse name: None  . Number of children: 2  . Years of education: college  . Highest education level: Bachelor's degree (e.g., BA, AB, BS)  Social Needs  . Financial resource strain: None  . Food insecurity - worry: None  . Food insecurity - inability: None  . Transportation needs - medical: None  . Transportation needs - non-medical: None  Occupational History  . Occupation: retired Pharmacist, hospital  Tobacco Use  . Smoking status: Never Smoker  .  Smokeless tobacco: Never Used  Substance and Sexual Activity  . Alcohol use: Yes    Comment: RARE  . Drug use: No  . Sexual activity: None  Other Topics Concern  . None  Social History Narrative   Lives at home at home with husband.    Right-handed.   No caffeine use.   Family History  Problem Relation Age of Onset  . Stomach cancer Mother   . Heart disease Mother 43       First MI early 31s, CABG   . Lung cancer Father        smoked  . Allergies Father   . Heart attack Father 58  . Brain cancer Brother   . Colon cancer Neg Hx   . Esophageal cancer Neg Hx   . Rectal cancer Neg Hx    Allergies  Allergen Reactions  . Ace Inhibitors     Pt unsure- nausea or hives  . Amoxicillin Hives  . Azathioprine Other (See Comments)    "cardiac issues"  . Celebrex [Celecoxib] Other (See Comments)    Severe stomach cramps  . Enalapril Other (See Comments)    congestion  . Fentanyl Nausea And Vomiting  . Fish Oil Other (See Comments)    Broke out with blisters  . Gabapentin     Pain and swelling   . Lanolin Acid [Lanolin]     rash  . Levofloxacin Other (See Comments)    "sore ankles"  . Methotrexate Derivatives Other (See Comments)    "cardiac issues"  . Nickel     rash  . Other Nausea And Vomiting    Narcotic Pain Killers- vomitting, nausea constantly  . Penicillins Hives    Has patient had a PCN reaction causing immediate rash, facial/tongue/throat swelling, SOB or lightheadedness with hypotension: unknown Has patient had a PCN reaction causing severe rash involving mucus membranes or skin necrosis: no Has patient had a PCN reaction that required hospitalization - no, was at MD office Has patient had a PCN reaction occurring within the last 10 years: no If all of the above answers are "NO", then may proceed with Cephalosporin use.   . Prednisone Other (See Comments)    High blood pressure.  . Propoxyphene Hcl Nausea Only  . Sulfonamide Derivatives Nausea Only and Other  (See Comments)    Stomach cramping  . Synvisc [Hylan G-F 20]     Made knee swell up at injection site   Prior to Admission medications   Medication Sig Start Date End Date Taking? Authorizing Provider  amLODipine (NORVASC) 2.5 MG tablet Take 1 tablet (2.5 mg total) daily by mouth. 01/26/17  Yes Fay Records, MD  folic acid (FOLVITE) 1 MG tablet Take 1 mg by mouth daily. 08/29/16  Yes [provider]  levocetirizine (XYZAL) 5 MG tablet Take 5 mg by mouth daily as needed for allergies.    Yes [provider]  milk thistle 175 MG tablet Take 175 mg by mouth  daily.   Yes [provider]  omeprazole (PRILOSEC) 40 MG capsule Take 1 capsule (40 mg total) by mouth 2 (two) times daily. 07/28/16  Yes Tanda Rockers, MD  propranolol (INDERAL) 10 MG tablet Take 10 mg by mouth 2 (two) times daily.    Yes [provider]  psyllium (HYDROCIL/METAMUCIL) 95 % PACK Take 1 packet by mouth 2 (two) times daily.   Yes [provider]  Red Yeast Rice Extract 300 MG CAPS Take 600 mg by mouth daily.   Yes [provider]   Dg Tibia/fibula Left  Result Date: 03/28/2017 CLINICAL DATA:  67 year old female with fall and left lower extremity pain. EXAM: LEFT TIBIA AND FIBULA - 2 VIEW COMPARISON:  None FINDINGS: There is a mildly displaced comminuted fracture of the proximal and mid tibial diaphysis. There is an oblique component of the fracture with 7 mm distraction gap and approximately 8 mm lateral displacement of the distal fragment. Faint linear lucency extending from the fracture proximally to the proximal tibial metadiaphysis likely component of the fracture. There is apparent discontinuity of the lateral cortex of the proximal tibial metaphysis with sclerotic changes and depression of the lateral tibial plateau most consistent with a depressed lateral tibial plateau fracture. Further evaluation with dedicated knee radiograph recommended. The bones are osteopenic. No  dislocation. The soft tissues are grossly unremarkable. IMPRESSION: Comminuted and mildly displaced fracture of the proximal to mid tibial diaphysis and depressed fracture of the lateral tibial plateau. No dislocation. Further evaluation with dedicated knee radiograph recommended. Electronically Signed   By: Anner Crete M.D.   On: 03/28/2017 02:28   Ct Tibia Fibula Left Wo Contrast  Result Date: 03/28/2017 CLINICAL DATA:  67 year old female with left lower extremity fracture. EXAM: CT OF THE LOWER LEFT EXTREMITY WITHOUT CONTRAST TECHNIQUE: Multidetector CT imaging of the lower left extremity was performed according to the standard protocol. COMPARISON:  Left lower extremity radiograph dated 03/28/2017 FINDINGS: Bones/Joint/Cartilage There is a comminuted fracture of the proximal to mid tibial diaphysis with approximately 8 mm lateral displacement of the distal fracture fragment. There is a comminuted intra-articular fracture of the proximal tibia with fractures of the medial and lateral tibial plateau. There is mildly displaced and depressed fracture of the lateral tibial plateau. There is extension of the fracture to the intercondylar eminence. Mildly displaced fracture of the proximal fibula. There is a nondisplaced oblique fracture of the distal fibula extending into the anterior aspect of the lateral malleolus. There is advanced osteopenia. There is osteoarthritic changes of the knee. No dislocation. There is a moderate lipohemarthrosis of the knee joint. Ligaments Suboptimally assessed by CT. Muscles and Tendons No large hematoma. Soft tissues Diffuse subcutaneous edema of the anterior leg. IMPRESSION: 1. Severely comminuted intra-articular fractures of the proximal tibia with involvement of the lateral and tibial plateau. 2. Mildly displaced comminuted fracture of the mid tibia. 3. Mildly displaced fracture of the fibular head as well as a nondisplaced oblique fracture of the distal fibula and lateral  malleolus. 4. No dislocation. 5. Osteoarthritic changes of the knee. Advanced osteopenia. Moderate knee lipohemarthrosis. 6. Electronically Signed   By: Anner Crete M.D.   On: 03/28/2017 04:55    Positive ROS: All other systems have been reviewed and were otherwise negative with the exception of those mentioned in the HPI and as above.  Objective: Labs cbc Recent Labs    03/28/17 0135  WBC 9.4  HGB 11.4*  HCT 34.7*  PLT 236  Labs inflam No results for input(s): CRP in the last 72 hours.  Invalid input(s): ESR  Labs coag No results for input(s): INR, PTT in the last 72 hours.  Invalid input(s): PT  Recent Labs    03/28/17 0135  NA 139  K 3.7  CL 108  CO2 22  GLUCOSE 132*  BUN 13  CREATININE 0.70  CALCIUM 8.0*    Physical Exam: Vitals:   03/28/17 0815 03/28/17 0830  BP: 134/76 124/80  Pulse: 88 82  Resp:    Temp:    SpO2: 95% 95%   General: Alert, no acute distress.  Calm, conversant.  Knee immobilizer in place.  Husband at bedside. Mental status: Alert and Oriented x3 Neurologic: Speech Clear and organized, no gross focal findings or movement disorder appreciated. Respiratory: No cyanosis, no use of accessory musculature Cardiovascular: No pedal edema GI: Abdomen is soft and non-tender, non-distended. Skin: Warm and dry.  No lesions in the area of chief complaint Extremities: Warm and well perfused w/o edema Psychiatric: Patient is competent for consent with normal mood and affect  MUSCULOSKELETAL:  Left knee with moderate effusion.  Moderate swelling-compartments are compressible.  Dorsiflexion/plantarflexion, EHL/FHL intact.  Sensation intact distally.  Foot warm with palpable pulses distally. Other extremities are atraumatic with painless ROM and NVI.   Prudencio Burly III PA-C 03/28/2017 8:39 AM

## 2017-03-28 NOTE — ED Notes (Signed)
Ortho at bedside discussing options with MD.

## 2017-03-28 NOTE — ED Triage Notes (Signed)
Patient arrived with EMS from home , she missed her step and fell while going down the stairs at home , reports left lower knee pain with mild swelling . She received Zofran 4 mg IV and Fentanyl 100 mcg by EMS prior to arrival .

## 2017-03-28 NOTE — Interval H&P Note (Signed)
History and Physical Interval Note:  03/28/2017 12:22 PM  Barbara [name]  has presented today for surgery, with the diagnosis of left leg fracture  The various methods of treatment have been discussed with the patient and family. After consideration of risks, benefits and other options for treatment, the patient has consented to  Procedure(s): EXTERNAL FIXATION LEG (Left) as a surgical intervention .  The patient's history has been reviewed, patient examined, no change in status, stable for surgery.  I have reviewed the patient's chart and labs.  Questions were answered to the patient's satisfaction.     Doyt Castellana D

## 2017-03-28 NOTE — ED Notes (Signed)
Patient transported to X-ray 

## 2017-03-28 NOTE — Anesthesia Preprocedure Evaluation (Signed)
Anesthesia Evaluation  Patient identified by MRN, date of birth, ID band Patient awake    Reviewed: Allergy & Precautions, NPO status , Patient's Chart, lab work & pertinent test results  History of Anesthesia Complications (+) PONV and history of anesthetic complications  Airway Mallampati: II  TM Distance: >3 FB Neck ROM: Full    Dental no notable dental hx. (+) Teeth Intact   Pulmonary asthma ,    Pulmonary exam normal breath sounds clear to auscultation       Cardiovascular hypertension, Pt. on medications  Rhythm:Regular Rate:Normal     Neuro/Psych  Headaches, Anxiety Trigeminal neuralgia  Neuromuscular disease    GI/Hepatic Neg liver ROS, GERD  Medicated and Controlled,  Endo/Other  Hyperlipidemia Obesity  Renal/GU Renal diseaseHx/o renal calculus  negative genitourinary   Musculoskeletal  (+) Arthritis , Osteoarthritis,    Abdominal (+) + obese,   Peds  Hematology negative hematology ROS (+)   Anesthesia Other Findings Allergic to Nickel and/or Titanium- develops autoimmune reaction. Vision, chest discomfort, pain.  Reproductive/Obstetrics                             Anesthesia Physical Anesthesia Plan  ASA: II  Anesthesia Plan: Spinal   Post-op Pain Management:  Regional for Post-op pain   Induction:   PONV Risk Score and Plan: Ondansetron, Propofol infusion, Midazolam and Treatment may vary due to age or medical condition  Airway Management Planned: Natural Airway, Nasal Cannula and Simple Face Mask  Additional Equipment:   Intra-op Plan:   Post-operative Plan:   Informed Consent: I have reviewed the patients History and Physical, chart, labs and discussed the procedure including the risks, benefits and alternatives for the proposed anesthesia with the patient or authorized representative who has indicated his/her understanding and acceptance.   Dental advisory  given  Plan Discussed with: CRNA, Anesthesiologist and Surgeon  Anesthesia Plan Comments:         Anesthesia Quick Evaluation

## 2017-03-28 NOTE — Anesthesia Procedure Notes (Signed)
Spinal  Patient location during procedure: OR End time: 03/28/2017 1:26 PM Staffing Anesthesiologist: Annye Asa, MD Performed: anesthesiologist  Preanesthetic Checklist Completed: patient identified, site marked, surgical consent, pre-op evaluation, timeout performed, IV checked, risks and benefits discussed and monitors and equipment checked Spinal Block Patient position: left lateral decubitus Prep: site prepped and draped and DuraPrep Patient monitoring: heart rate, cardiac monitor, continuous pulse ox and blood pressure Approach: midline Location: L3-4 Injection technique: single-shot Needle Needle type: Pencan  Needle gauge: 24 G Needle length: 9 cm Additional Notes Pt identified in Operating room.  Monitors applied. Working IV access confirmed. Sterile prep, drape lumbar spine.  1% lido local L 3,4.  #24ga Pencan into clear CSF L 3,4.  13.5 mg 0.75% Bupivacaine with dextrose injected with asp CSF beginning and end of injection.  Patient asymptomatic, VSS, no heme aspirated, tolerated well.  Jenita Seashore, MD

## 2017-03-28 NOTE — Transfer of Care (Signed)
Immediate Anesthesia Transfer of Care Note  Patient: Barbara [name]  Procedure(s) Performed: EXTERNAL FIXATION LEG (Left )  Patient Location: PACU  Anesthesia Type:MAC and Spinal  Level of Consciousness: awake, alert  and oriented  Airway & Oxygen Therapy: Patient Spontanous Breathing and Patient connected to nasal cannula oxygen  Post-op Assessment: Report given to RN and Post -op Vital signs reviewed and stable  Post vital signs: Reviewed and stable  Last Vitals:  Vitals:   03/28/17 1100 03/28/17 1500  BP:  (P) 127/72  Pulse: 94 (P) 93  Resp:  (P) 16  Temp:  (P) 36.5 C  SpO2: 94% (P) 100%    Last Pain:  Vitals:   03/28/17 1500  TempSrc:   PainSc: (P) 0-No pain      Patients Stated Pain Goal: 4 (70/62/37 6283)  Complications: No apparent anesthesia complications

## 2017-03-28 NOTE — ED Provider Notes (Signed)
[facility] Provider Note   CSN: [id number] Arrival date & time: 03/28/17  0100     History   Chief Complaint Chief Complaint  Patient presents with  . Fall    Left Lower Leg Injury    HPI Barbara [name] is a 67 y.o. female.  HPI  This is 67 year old female with history of hypertension, hyperlipidemia who presents with left leg pain.  Patient reports that she was going to bed upstairs when she forgot something.  She turned around and missed stepped down a step.  She reports pain in the left lower leg.  Previously reports that she had a femur injury in the same leg.  Denies any numbness or tingling of the leg.  Denies hitting her head or loss of consciousness.  She is not on any blood thinners.  Currently rates her pain at 10 out of 10.  However, she does not tolerate narcotic pain medication and reports nausea after receiving fentanyl by EMS.  Past Medical History:  Diagnosis Date  . Anxiety   . Arthritis    knees  . Cancer ([place])    basal cell- nose  . GERD (gastroesophageal reflux disease)   . Hyperlipidemia   . Hypertension   . Meningioma ([place])   . Neuromuscular disorder ([place])    benign tremor- takes Propanolol  . PONV (postoperative nausea and vomiting)    "BP DROPS ALSO"  . Trigeminal neuralgia of right side of face     Patient Active Problem List   Diagnosis Date Noted  . Closed left tibial fracture 03/28/2017  . Nonintractable headache 01/25/2017  . Visual field defect   . Chest pain 09/16/2016  . Essential tremor 09/16/2016  . Right facial numbness 09/16/2016  . Bilateral scleritis 08/29/2016  . Morbid obesity due to excess calories ([place]) 06/01/2016  . Dyspnea on exertion 05/13/2016  . Hoarseness 03/17/2016  . Gastroesophageal reflux disease without esophagitis 03/17/2016  . Globus sensation 03/17/2016  . Gastritis 02/25/2016  . Atypical chest pain 02/24/2016  . Painful orthopaedic hardware ([place]) 04/15/2013  .  VITAMIN D DEFICIENCY 12/09/2009  . HYPERCHOLESTEROLEMIA 12/09/2009  . Essential hypertension 12/09/2009  . Allergic rhinitis 12/09/2009  . NEPHROLITHIASIS 12/09/2009  . CERVICAL POLYP 12/09/2009  . OSTEOARTHRITIS 12/09/2009  . Cough variant asthma vs UACS  12/09/2009    Past Surgical History:  Procedure Laterality Date  . [place] STUDY N/A 04/17/2016   Procedure: [place] STUDY;  Surgeon: Manus Gunning, MD;  Location: [facility];  Service: Gastroenterology;  Laterality: N/A;  . ESOPHAGEAL MANOMETRY N/A 04/17/2016   Procedure: ESOPHAGEAL MANOMETRY (EM);  Surgeon: Manus Gunning, MD;  Location: [facility];  Service: Gastroenterology;  Laterality: N/A;  . fractured femur  05/2012   left - rod -screws placed  . HARDWARE REMOVAL Left 04/16/2013   Procedure: REMOVAL OF HARDWARE OF LEFT KNEE (DISTAL INTERLOC SCREW);  Surgeon: Gearlean Alf, MD;  Location: [facility];  Service: Orthopedics;  Laterality: Left;  . KNEE SURGERY     x 3 left / x1 rt knee  . LEFT HEART CATH AND CORONARY ANGIOGRAPHY N/A 09/18/2016   Procedure: Left Heart Cath and Coronary Angiography;  Surgeon: Sherren Mocha, MD;  Location: [facility];  Service: Cardiovascular;  Laterality: N/A;  . NASAL SINUS SURGERY     2000  . OTHER SURGICAL HISTORY  2014   titanium rod in left femur used for fracture repair  . SHOULDER SURGERY  bilateral shoulders   . WISDOM TOOTH EXTRACTION      OB History    No data available       Home Medications    Prior to Admission medications   Medication Sig Start Date End Date Taking? Authorizing Provider  amLODipine (NORVASC) 2.5 MG tablet Take 1 tablet (2.5 mg total) daily by mouth. 01/26/17  Yes Fay Records, MD  folic acid (FOLVITE) 1 MG tablet Take 1 mg by mouth daily. 08/29/16  Yes [provider]  levocetirizine (XYZAL) 5 MG tablet Take 5 mg by mouth daily as needed for allergies.    Yes [provider]  milk thistle 175 MG tablet Take  175 mg by mouth daily.   Yes [provider]  omeprazole (PRILOSEC) 40 MG capsule Take 1 capsule (40 mg total) by mouth 2 (two) times daily. 07/28/16  Yes Tanda Rockers, MD  propranolol (INDERAL) 10 MG tablet Take 10 mg by mouth 2 (two) times daily.    Yes [provider]  psyllium (HYDROCIL/METAMUCIL) 95 % PACK Take 1 packet by mouth 2 (two) times daily.   Yes [provider]  Red Yeast Rice Extract 300 MG CAPS Take 600 mg by mouth daily.   Yes [provider]    Family History Family History  Problem Relation Age of Onset  . Stomach cancer Mother   . Heart disease Mother 49       First MI early 61s, CABG   . Lung cancer Father        smoked  . Allergies Father   . Heart attack Father 84  . Brain cancer Brother   . Colon cancer Neg Hx   . Esophageal cancer Neg Hx   . Rectal cancer Neg Hx     Social History Social History   Tobacco Use  . Smoking status: Never Smoker  . Smokeless tobacco: Never Used  Substance Use Topics  . Alcohol use: Yes    Comment: RARE  . Drug use: No     Allergies   Ace inhibitors; Amoxicillin; Azathioprine; Celebrex [celecoxib]; Enalapril; Fentanyl; Fish oil; Gabapentin; Lanolin acid [lanolin]; Levofloxacin; Methotrexate derivatives; Nickel; Other; Penicillins; Prednisone; Propoxyphene hcl; Sulfonamide derivatives; and Synvisc [hylan g-f 20]   Review of Systems Review of Systems  Constitutional: Negative for fever.  Musculoskeletal:       Left leg pain  Skin: Negative for wound.  Neurological: Negative for weakness and numbness.  All other systems reviewed and are negative.    Physical Exam Updated Vital Signs BP (!) 144/76 (BP Location: Right Arm)   Pulse 87   Temp 98 F (36.7 C) (Oral)   Resp 18   SpO2 93%   Physical Exam  Constitutional: She is oriented to person, place, and time. She appears well-developed and well-nourished. No distress.  HENT:  Head: Normocephalic and atraumatic.    Cardiovascular: Normal rate, regular rhythm and normal heart sounds.  Pulmonary/Chest: Effort normal and breath sounds normal. No respiratory distress. She has no wheezes.  Abdominal: Soft. There is no tenderness.  Musculoskeletal:  Tenderness to palpation with swelling noted over the mid proximal left tibia, decreased range of motion at the knee secondary to pain, 2+ DP pulse, compartment soft  Neurological: She is alert and oriented to person, place, and time.  Skin: Skin is warm and dry.  Psychiatric: She has a normal mood and affect.  Nursing note and vitals reviewed.    ED Treatments / Results  Labs (all  labs ordered are listed, but only abnormal results are displayed) Labs Reviewed  CBC WITH DIFFERENTIAL/PLATELET - Abnormal; Notable for the following components:      Result Value   Hemoglobin 11.4 (*)    HCT 34.7 (*)    All other components within normal limits  BASIC METABOLIC PANEL - Abnormal; Notable for the following components:   Glucose, Bld 132 (*)    Calcium 8.0 (*)    All other components within normal limits    EKG  EKG Interpretation None       Radiology Dg Tibia/fibula Left  Result Date: 03/28/2017 CLINICAL DATA:  67 year old female with fall and left lower extremity pain. EXAM: LEFT TIBIA AND FIBULA - 2 VIEW COMPARISON:  None FINDINGS: There is a mildly displaced comminuted fracture of the proximal and mid tibial diaphysis. There is an oblique component of the fracture with 7 mm distraction gap and approximately 8 mm lateral displacement of the distal fragment. Faint linear lucency extending from the fracture proximally to the proximal tibial metadiaphysis likely component of the fracture. There is apparent discontinuity of the lateral cortex of the proximal tibial metaphysis with sclerotic changes and depression of the lateral tibial plateau most consistent with a depressed lateral tibial plateau fracture. Further evaluation with dedicated knee radiograph  recommended. The bones are osteopenic. No dislocation. The soft tissues are grossly unremarkable. IMPRESSION: Comminuted and mildly displaced fracture of the proximal to mid tibial diaphysis and depressed fracture of the lateral tibial plateau. No dislocation. Further evaluation with dedicated knee radiograph recommended. Electronically Signed   By: Anner Crete M.D.   On: 03/28/2017 02:28   Ct Tibia Fibula Left Wo Contrast  Result Date: 03/28/2017 CLINICAL DATA:  67 year old female with left lower extremity fracture. EXAM: CT OF THE LOWER LEFT EXTREMITY WITHOUT CONTRAST TECHNIQUE: Multidetector CT imaging of the lower left extremity was performed according to the standard protocol. COMPARISON:  Left lower extremity radiograph dated 03/28/2017 FINDINGS: Bones/Joint/Cartilage There is a comminuted fracture of the proximal to mid tibial diaphysis with approximately 8 mm lateral displacement of the distal fracture fragment. There is a comminuted intra-articular fracture of the proximal tibia with fractures of the medial and lateral tibial plateau. There is mildly displaced and depressed fracture of the lateral tibial plateau. There is extension of the fracture to the intercondylar eminence. Mildly displaced fracture of the proximal fibula. There is a nondisplaced oblique fracture of the distal fibula extending into the anterior aspect of the lateral malleolus. There is advanced osteopenia. There is osteoarthritic changes of the knee. No dislocation. There is a moderate lipohemarthrosis of the knee joint. Ligaments Suboptimally assessed by CT. Muscles and Tendons No large hematoma. Soft tissues Diffuse subcutaneous edema of the anterior leg. IMPRESSION: 1. Severely comminuted intra-articular fractures of the proximal tibia with involvement of the lateral and tibial plateau. 2. Mildly displaced comminuted fracture of the mid tibia. 3. Mildly displaced fracture of the fibular head as well as a nondisplaced oblique  fracture of the distal fibula and lateral malleolus. 4. No dislocation. 5. Osteoarthritic changes of the knee. Advanced osteopenia. Moderate knee lipohemarthrosis. 6. Electronically Signed   By: Anner Crete M.D.   On: 03/28/2017 04:55    Procedures Procedures (including critical care time)  Medications Ordered in ED Medications  morphine 4 MG/ML injection 4 mg (4 mg Intravenous Not Given 03/28/17 0133)  ondansetron (ZOFRAN) injection 4 mg (4 mg Intravenous Given 03/28/17 0135)  acetaminophen (TYLENOL) tablet 1,000 mg (1,000 mg Oral Given 03/28/17 0135)  fentaNYL (SUBLIMAZE) injection 50 mcg (50 mcg Intravenous Given 03/28/17 0426)  ondansetron (ZOFRAN) injection 4 mg (4 mg Intravenous Given 03/28/17 0427)  acetaminophen (TYLENOL) tablet 650 mg (650 mg Oral Given 03/28/17 6812)     Initial Impression / Assessment and Plan / ED Course  I have reviewed the triage vital signs and the nursing notes.  Pertinent labs & imaging results that were available during my care of the patient were reviewed by me and considered in my medical decision making (see chart for details).  Clinical Course as of Mar 29 623  Wed Mar 28, 2017  0245 Spoke with Dr. Noemi Chapel, on-call for Dr. Percell Miller.  Requesting long-leg knee brace.  Request hospitalist admission if possible.  Discussed with Dr. Myna Hidalgo who does not believe the patient has a medical reason for admission.  [CH]  [phone] Dr. Noemi Chapel will have the patient evaluated by orthopedics for admission.  Patient pain is controlled after placement of knee immobilizer.  CT completed.  [CH]    Clinical Course User Index [CH] Sosha Shepherd, Barbette Hair, MD    Patient presents with left leg injury.  Reports mechanical fall.  She is tenderness on exam.  She is declining any narcotic pain medication.  She was given 1 dose of Tylenol with a small sip of water.  X-ray shows a comminuted mid shaft tibia fracture as well as a tibial plateau fracture.  CT scan ordered for further  delineation.  See clinical course above.  No evidence of compartment syndrome at this time.  She will need admission for further monitoring for compartment syndrome and will likely need definitive repair.  Final Clinical Impressions(s) / ED Diagnoses   Final diagnoses:  Fall, initial encounter  Closed displaced comminuted fracture of shaft of left tibia, initial encounter  Tibial plateau fracture, left, closed, initial encounter    ED Discharge Orders    None       Starlynn Klinkner, Barbette Hair, MD 03/28/17 [phone]

## 2017-03-28 NOTE — ED Notes (Signed)
Husband, Jeff's cell phone [phone].  Requests that we call him if wife goes up to surgery.

## 2017-03-29 ENCOUNTER — Other Ambulatory Visit: Payer: Self-pay

## 2017-03-29 ENCOUNTER — Encounter (HOSPITAL_COMMUNITY): Payer: Self-pay | Admitting: General Practice

## 2017-03-29 MED ORDER — DOCUSATE SODIUM 100 MG PO CAPS
100.0000 mg | ORAL_CAPSULE | Freq: Two times a day (BID) | ORAL | 0 refills | Status: DC
Start: 2017-03-29 — End: 2017-05-02

## 2017-03-29 MED ORDER — TRAMADOL HCL 50 MG PO TABS: 50.0000 mg | ORAL_TABLET | Freq: Four times a day (QID) | ORAL | 0 refills | Status: DC | PRN

## 2017-03-29 MED ORDER — METHOCARBAMOL 500 MG PO TABS: 500.0000 mg | ORAL_TABLET | Freq: Three times a day (TID) | ORAL | 0 refills | Status: DC | PRN

## 2017-03-29 MED ORDER — ACETAMINOPHEN 500 MG PO TABS: 1000.0000 mg | ORAL_TABLET | Freq: Three times a day (TID) | ORAL | 0 refills | Status: AC

## 2017-03-29 MED ORDER — ONDANSETRON HCL 4 MG PO TABS: 4.0000 mg | ORAL_TABLET | Freq: Three times a day (TID) | ORAL | 0 refills | Status: DC | PRN

## 2017-03-29 MED ORDER — ENOXAPARIN SODIUM 40 MG/0.4ML ~~LOC~~ SOLN: 40.0000 mg | SUBCUTANEOUS | 0 refills | Status: DC

## 2017-03-29 NOTE — Evaluation (Signed)
Occupational Therapy Evaluation Patient Details Name: Barbara [name] MRN: [id number] DOB: 06-11-1950 Today's Date: 03/29/2017    History of Present Illness  67 year old female with who presents with left leg pain after fall down 2 steps at home, xrays=mildly displaced comminuted fx L tibial shaft; pt is s/p external fixation L tibial fx;   PMHx: hypertension, hyperlipidemia, L femur fx s/p ORIF and subsequent hardware removal , bil shoulder surgers   Clinical Impression   This 67 y/o F presents with the above. At baseline Pt is independent with ADLs and functional mobility. Pt motivated to work and progress with therapy to return home. Pt presenting with increased pain, decreased dynamic balance and activity tolerance. Pt completing stand pivot transfers at RW level with MinA+2 this session, requiring MaxA for toileting and LB ADLs. Pt demonstrates good understanding of NWB status in LLE and is able to maintain during functional transfer completion. Pt will benefit from continued acute OT services and recommend additional [facility] services after discharge to maximize Pt's safety and independence with ADLs and mobility.     Follow Up Recommendations  Home health OT;Supervision/Assistance - 24 hour    Equipment Recommendations  3 in 1 bedside commode           Precautions / Restrictions Precautions Precautions: Fall Restrictions Weight Bearing Restrictions: Yes LLE Weight Bearing: Non weight bearing      Mobility Bed Mobility Overal bed mobility: Needs Assistance Bed Mobility: Sit to Supine     Supine to sit: Min assist Sit to supine: Min assist   General bed mobility comments: assist with LLE , incr time needed, cues for positioning  Transfers Overall transfer level: Needs assistance Equipment used: Rolling walker (2 wheeled) Transfers: Sit to/from [facility] Sit to Stand: Min assist;+2 safety/equipment;+2 physical assistance Stand pivot transfers: Min  assist;+2 physical assistance;+2 safety/equipment       General transfer comment: cues for hand placement and NWB and RW safety, pt is able to maintain NWB LLE, incr time needed; transfers EOB>BSC>recliner     Balance Overall balance assessment: History of Falls         Standing balance support: During functional activity;Single extremity supported;Bilateral upper extremity supported Standing balance-Leahy Scale: Poor Standing balance comment: reliant on UE support                            ADL either performed or assessed with clinical judgement   ADL Overall ADL's : Needs assistance/impaired Eating/Feeding: Modified independent;Sitting   Grooming: Set up;Sitting   Upper Body Bathing: Min guard;Sitting   Lower Body Bathing: Moderate assistance;Sit to/from stand   Upper Body Dressing : Set up;Sitting   Lower Body Dressing: Maximal assistance;Sit to/from stand   Toilet Transfer: Minimal assistance;+2 for safety/equipment;+2 for physical assistance;Stand-pivot;BSC;RW   Toileting- Clothing Manipulation and Hygiene: Maximal assistance;Sit to/from stand;+2 for safety/equipment Toileting - Clothing Manipulation Details (indicate cue type and reason): Pt able to maintain standing with MinA while additional therapist assists with peri-care; Pt able to cleanse peri area, requires assist to cleanse buttocks      Functional mobility during ADLs: Minimal assistance;+2 for physical assistance;+2 for safety/equipment;Rolling walker General ADL Comments: Pt completes stand pivot transfers EOB>BSC>recliner with MinA+2, demonstrates good understanding and carryover of NWB status during mobility and functional task completion                          Pertinent Vitals/Pain Pain  Assessment: 0-10 Pain Score: 5  Pain Location: LLE Pain Descriptors / Indicators: Grimacing;Guarding Pain Intervention(s): Limited activity within patient's tolerance;Monitored during  session;Premedicated before session;Repositioned          Extremity/Trunk Assessment Upper Extremity Assessment Upper Extremity Assessment: Overall WFL for tasks assessed   Lower Extremity Assessment Lower Extremity Assessment: Defer to PT evaluation LLE Deficits / Details: unable to test ankle/foot d/t  pain; knee flexion AAROM grossly 20* to 50*, limited by pain; hip grossly WFL; strength grossly 2+/5, limited by anticipated post op pain LLE: Unable to fully assess due to immobilization       Communication Communication Communication: No difficulties   Cognition Arousal/Alertness: Awake/alert Behavior During Therapy: WFL for tasks assessed/performed Overall Cognitive Status: Within Functional Limits for tasks assessed                                           Exercises Exercises: (encouraged L ankle/foot movement as tol)         Home Living Family/patient expects to be discharged to:: Private residence Living Arrangements: Spouse/significant other Available Help at Discharge: Family;Available 24 hours/day Type of Home: House Home Access: Stairs to enter [employer] of Steps: 2(checking on ramp) Entrance Stairs-Rails: Left Home Layout: Two level;Bed/bath upstairs;1/2 bath on main level     Bathroom Shower/Tub: Occupational psychologist: (comfort height )     Home Equipment: Walker - 2 wheels;Cane - single point;Wheelchair - manual;Grab bars - tub/shower;Shower seat - built in;Shower seat   Additional Comments: husband is retired but has recent hernia (scheduled for surgery 2/8) per pt; he is unable to provide physical assist      Prior Functioning/Environment Level of Independence: Independent                 OT Problem List: Decreased strength;Impaired balance (sitting and/or standing);Pain;Decreased range of motion;Decreased activity tolerance;Decreased knowledge of use of DME or AE      OT Treatment/Interventions:  Self-care/ADL training;Therapeutic activities;DME and/or AE instruction;Balance training;Therapeutic exercise;Energy conservation;Patient/family education    OT Goals(Current goals can be found in the care plan section) Acute Rehab OT Goals Patient Stated Goal: to go home if at all possible OT Goal Formulation: With patient Time For Goal Achievement: 04/12/17 Potential to Achieve Goals: Good  OT Frequency: Min 2X/week               Co-evaluation PT/OT/SLP Co-Evaluation/Treatment: Yes Reason for Co-Treatment: For patient/therapist safety PT goals addressed during session: Mobility/safety with mobility;Proper use of DME OT goals addressed during session: ADL's and self-care;Proper use of Adaptive equipment and DME      AM-PAC PT "6 Clicks" Daily Activity     Outcome Measure Help from another person eating meals?: None Help from another person taking care of personal grooming?: A Little Help from another person toileting, which includes using toliet, bedpan, or urinal?: A Lot Help from another person bathing (including washing, rinsing, drying)?: A Lot Help from another person to put on and taking off regular upper body clothing?: None Help from another person to put on and taking off regular lower body clothing?: A Lot 6 Click Score: 17   End of Session Equipment Utilized During Treatment: Gait belt;Rolling walker;Other (comment)(L ex-fix ) Nurse Communication: Mobility status  Activity Tolerance: Patient tolerated treatment well;Other (comment)(limited by nausea) Patient left: in chair;with call bell/phone within reach  OT Visit  Diagnosis: History of falling (Z91.81);Other abnormalities of gait and mobility (R26.89);Pain Pain - Right/Left: Left Pain - part of body: Leg;Ankle and joints of foot                Time: 8592-7639 OT Time Calculation (min): 32 min Charges:  OT General Charges $OT Visit: 1 Visit OT Evaluation $OT Eval Moderate Complexity: 1 Mod G-Codes:      Lou Cal, OT Pager [phone] 03/29/2017   Raymondo Band 03/29/2017, 3:38 PM

## 2017-03-29 NOTE — Progress Notes (Signed)
   Assessment / Plan: 1 Day Post-Op  S/P Procedure(s) (LRB): EXTERNAL FIXATION LEG (Left) by Dr. Ernesta Amble. Percell Miller and Dr. Doreatha Martin on 03/28/2017  Principal Problem:   Closed left tibial fracture Active Problems:   Essential hypertension   Closed fracture of left tibial plateau   Closed left tibial shaft, plateau fracture Doing well postop day 1. Pain under decent control with Tylenol and tramadol. Dressings with moderate bloody drainage-appears stable but will follow.   Mobilize with therapy Incentive Spirometry Elevate and Apply ice  Weight Bearing: Non Weight Bearing (NWB) LLE Dressings: Reinforce as needed-possibly will change 03/30/2017.   VTE prophylaxis: Lovenox, SCD, ambulation Dispo: Therapy evaluations pending.  Patient would like to be able to go home if able to mobilize some and meet basic needs.  Remain inpatient for now for therapy, mobilization/pain control  Subjective: Patient reports pain as moderate.  Tolerating diet.  Urinating.  No CP, SOB.  Not yet OOB.  Objective:   VITALS:   Vitals:   03/28/17 1720 03/28/17 2213 03/29/17 0052 03/29/17 0515  BP: 127/70 (!) 142/77 122/72 130/80  Pulse: (!) 102 100 84 90  Resp:  18 16 16   Temp: 99.3 F (37.4 C) 98.2 F (36.8 C) 98.5 F (36.9 C) 98.2 F (36.8 C)  TempSrc: Oral Oral Oral Oral  SpO2: 96% 93% 95% 95%   CBC Latest Ref Rng & Units 03/28/2017 09/18/2016 09/17/2016  WBC 4.0 - 10.5 K/uL 9.4 7.7 9.3  Hemoglobin 12.0 - 15.0 g/dL 11.4(L) 12.9 12.5  Hematocrit 36.0 - 46.0 % 34.7(L) 40.6 39.9  Platelets 150 - 400 K/uL 236 255 260   BMP Latest Ref Rng & Units 03/28/2017 09/18/2016 09/17/2016  Glucose 65 - 99 mg/dL 132(H) 101(H) 93  BUN 6 - 20 mg/dL 13 13 14   Creatinine 0.44 - 1.00 mg/dL 0.70 0.89 0.86  Sodium 135 - 145 mmol/L 139 138 140  Potassium 3.5 - 5.1 mmol/L 3.7 3.9 3.9  Chloride 101 - 111 mmol/L 108 104 107  CO2 22 - 32 mmol/L 22 29 27   Calcium 8.9 - 10.3 mg/dL 8.0(L) 8.7(L) 8.4(L)   Intake/Output    01/16 0701 - 01/17 0700   I.V. 848.3   Total Intake 848.3   Urine 400   Blood 25   Total Output 425   Net +423.3       Urine Occurrence 3 x      Physical Exam: General: NAD.  Upright in bed.  Calm, conversant. MSK Compartments compressible. Neurovascularly intact Sensation intact distally Intact pulses distally Dorsiflexion/Plantar flexion intact Incision: dressing with moderate bloody drainage-appears stable.   Barbara Elizabeth Martensen III, PA-C 03/29/2017, 6:59 AM

## 2017-03-29 NOTE — Progress Notes (Signed)
Physical Therapy Treatment Patient Details Name: Barbara [name] MRN: [id number] DOB: [birth year] Today's Date: 03/29/2017    History of Present Illness  67 year old female with who presents with left leg pain after fall down 2 steps at home, xrays=mildly displaced comminuted fx L tibial shaft; pt is s/p external fixation L tibial fx;   PMHx: hypertension, hyperlipidemia, L femur fx s/p ORIF and subsequent hardware removal , bil shoulder surgers    PT Comments    Pt requesting back to bed d/t LLE discomfort increasing; pt progressing toward goals, able to take a few steps with RW, maintains NWB with cues; will continue POC with goal of home with HHPT   Follow Up Recommendations  Home health PT;Supervision for mobility/OOB     Equipment Recommendations  None recommended by PT    Recommendations for Other Services       Precautions / Restrictions Precautions Precautions: Fall Restrictions Weight Bearing Restrictions: Yes LLE Weight Bearing: Non weight bearing    Mobility  Bed Mobility Overal bed mobility: Needs Assistance Bed Mobility: Sit to Supine     Supine to sit: Min assist Sit to supine: Min assist   General bed mobility comments: assist with LLE , incr time needed, cues for positioning  Transfers Overall transfer level: Needs assistance Equipment used: Rolling walker (2 wheeled) Transfers: Sit to/from [facility] Sit to Stand: Min assist;Min guard Stand pivot transfers: Min assist;Min guard       General transfer comment: cues for hand placement and NWB and RW safety, pt is able to maintain NWB LLE, incr time needed  Ambulation/Gait Ambulation/Gait assistance: Min assist Ambulation Distance (Feet): 4 Feet Assistive device: Rolling walker (2 wheeled) Gait Pattern/deviations: Trunk flexed     General Gait Details: small steps from chair to [facility], cues for trunk extension, NWB LLE, assist for balance   Stairs            Wheelchair  Mobility    Modified Rankin (Stroke Patients Only)       Balance Overall balance assessment: History of Falls         Standing balance support: During functional activity;Single extremity supported;Bilateral upper extremity supported Standing balance-Leahy Scale: Poor Standing balance comment: reliant on UE support                             Cognition Arousal/Alertness: Awake/alert Behavior During Therapy: WFL for tasks assessed/performed Overall Cognitive Status: Within Functional Limits for tasks assessed                                        Exercises      General Comments        Pertinent Vitals/Pain Pain Assessment: 0-10 Pain Score: 4  Pain Location: LLE Pain Descriptors / Indicators: Grimacing;Guarding Pain Intervention(s): Limited activity within patient's tolerance;Monitored during session;Repositioned    Home Living Family/patient expects to be discharged to:: Private residence Living Arrangements: Spouse/significant other Available Help at Discharge: Family;Available 24 hours/day Type of Home: House Home Access: Stairs to enter Entrance Stairs-Rails: Left Home Layout: Two level;Bed/bath upstairs;1/2 bath on main level Home Equipment: Walker - 2 wheels;Cane - single point;Wheelchair - manual;Grab bars - tub/shower;Shower seat - built in;Shower seat Additional Comments: husband is retired but has recent hernia (scheduled for surgery 2/8) per pt; he is unable to provide physical assist  Prior Function Level of Independence: Independent          PT Goals (current goals can now be found in the care plan section) Acute Rehab PT Goals Patient Stated Goal: to go home if at all possible PT Goal Formulation: With patient Time For Goal Achievement: 04/05/17 Potential to Achieve Goals: Good Progress towards PT goals: Progressing toward goals    Frequency    Min 6X/week      PT Plan Current plan remains appropriate     Co-evaluation PT/OT/SLP Co-Evaluation/Treatment: Yes Reason for Co-Treatment: For patient/therapist safety PT goals addressed during session: Mobility/safety with mobility;Proper use of DME        AM-PAC PT "6 Clicks" Daily Activity  Outcome Measure  Difficulty turning over in bed (including adjusting bedclothes, sheets and blankets)?: Unable Difficulty moving from lying on back to sitting on the side of the bed? : Unable Difficulty sitting down on and standing up from a chair with arms (e.g., wheelchair, bedside commode, etc,.)?: Unable Help needed moving to and from a bed to chair (including a wheelchair)?: A Little Help needed walking in hospital room?: A Little Help needed climbing 3-5 steps with a railing? : A Lot 6 Click Score: 11    End of Session Equipment Utilized During Treatment: Gait belt Activity Tolerance: Patient tolerated treatment well Patient left: in bed;with call bell/phone within reach;with bed alarm set   PT Visit Diagnosis: Pain;History of falling (Z91.81);Unsteadiness on feet (R26.81) Pain - Right/Left: Left Pain - part of body: Leg     Time: 9563-8756 PT Time Calculation (min) (ACUTE ONLY): 26 min  Charges:  $Therapeutic Activity: 23-37 mins                    G CodesKenyon Ana, PT Pager: [phone] 03/29/2017    Kenyon Ana 03/29/2017, 3:10 PM

## 2017-03-29 NOTE — Evaluation (Signed)
Physical Therapy Evaluation Patient Details Name: Barbara [name] MRN: [id number] DOB: [birth year] Today's Date: 03/29/2017   History of Present Illness   67 year old female with who presents with left leg pain after fall down 2 steps at home, xrays=mildly displaced comminuted fx L tibial shaft; pt is s/p external fixation L tibial fx;   PMHx: hypertension, hyperlipidemia, L femur fx s/p ORIF and subsequent hardware removal , bil shoulder surgers  Clinical Impression  Pt admitted with above diagnosis. Pt currently with functional limitations due to the deficits listed below (see PT Problem List). Pt is independent at her baseline and is motivated to d/c home is possible, pt min assist today for stand pivot transfers and able to maintain NWB with cues, although gait limited by severe nausea;   Pt will benefit from skilled PT to increase their independence and safety with mobility to allow discharge to the venue listed below.       Follow Up Recommendations Home health PT;Supervision for mobility/OOB    Equipment Recommendations  None recommended by PT    Recommendations for Other Services       Precautions / Restrictions Precautions Precautions: Fall Restrictions Weight Bearing Restrictions: Yes LLE Weight Bearing: Non weight bearing      Mobility  Bed Mobility Overal bed mobility: Needs Assistance Bed Mobility: Supine to Sit     Supine to sit: Min assist     General bed mobility comments: assist with LLE, cues for hand placement, pt uses rail to bring trunk to upright, incr time needed  Transfers Overall transfer level: Needs assistance Equipment used: Rolling walker (2 wheeled) Transfers: Sit to/from [facility] Sit to Stand: Min assist Stand pivot transfers: Min assist       General transfer comment: cues for hand placement and NWB and RW safety, pt is able to maintain NWB LLE, incr time needed  Ambulation/Gait             General Gait  Details: NT today (other than pivotal steps to chair) d/t severe nausea  Stairs            Wheelchair Mobility    Modified Rankin (Stroke Patients Only)       Balance Overall balance assessment: History of Falls         Standing balance support: During functional activity;Single extremity supported;Bilateral upper extremity supported Standing balance-Leahy Scale: Poor Standing balance comment: reliant on UE support                              Pertinent Vitals/Pain Pain Assessment: 0-10 Pain Score: 2  Pain Location: LLE Pain Descriptors / Indicators: Discomfort Pain Intervention(s): Premedicated before session;Monitored during session;Repositioned    Home Living Family/patient expects to be discharged to:: Private residence Living Arrangements: Spouse/significant other Available Help at Discharge: Family;Available 24 hours/day Type of Home: House Home Access: Stairs to enter Entrance Stairs-Rails: Left Entrance Stairs-Number of Steps: 2(checking on ramp) Home Layout: Two level;Bed/bath upstairs;1/2 bath on main level Home Equipment: Walker - 2 wheels;Cane - single point;Wheelchair - manual;Grab bars - tub/shower;Shower seat - built in;Shower seat Additional Comments: husband is retired but has recent hernia (scheduled for surgery 2/8) per pt; he is unable to provide physical assist    Prior Function Level of Independence: Independent               Hand Dominance        Extremity/Trunk Assessment   Upper Extremity  Assessment Upper Extremity Assessment: Defer to OT evaluation    Lower Extremity Assessment Lower Extremity Assessment: LLE deficits/detail LLE Deficits / Details: unable to test ankle/foot d/t  pain; knee flexion AAROM grossly 20* to 50*, limited by pain; hip grossly WFL; strength grossly 2+/5, limited by anticipated post op pain LLE: Unable to fully assess due to immobilization       Communication   Communication: No  difficulties  Cognition Arousal/Alertness: Awake/alert Behavior During Therapy: WFL for tasks assessed/performed Overall Cognitive Status: Within Functional Limits for tasks assessed                                        General Comments      Exercises     Assessment/Plan    PT Assessment Patient needs continued PT services  PT Problem List Decreased strength;Decreased activity tolerance;Decreased balance;Decreased range of motion;Decreased mobility;Decreased knowledge of use of DME;Pain;Decreased knowledge of precautions       PT Treatment Interventions DME instruction;Stair training;Functional mobility training;Therapeutic activities;Therapeutic exercise;Patient/family education;Gait training    PT Goals (Current goals can be found in the Care Plan section)  Acute Rehab PT Goals Patient Stated Goal: to go home if at all possible PT Goal Formulation: With patient Time For Goal Achievement: 04/05/17 Potential to Achieve Goals: Good    Frequency Min 6X/week   Barriers to discharge        Co-evaluation PT/OT/SLP Co-Evaluation/Treatment: Yes Reason for Co-Treatment: For patient/therapist safety PT goals addressed during session: Mobility/safety with mobility;Proper use of DME         AM-PAC PT "6 Clicks" Daily Activity  Outcome Measure Difficulty turning over in bed (including adjusting bedclothes, sheets and blankets)?: Unable Difficulty moving from lying on back to sitting on the side of the bed? : Unable Difficulty sitting down on and standing up from a chair with arms (e.g., wheelchair, bedside commode, etc,.)?: Unable Help needed moving to and from a bed to chair (including a wheelchair)?: A Little Help needed walking in hospital room?: A Little Help needed climbing 3-5 steps with a railing? : A Lot 6 Click Score: 11    End of Session Equipment Utilized During Treatment: Gait belt Activity Tolerance: Patient tolerated treatment well;Other  (comment)(limited by nausea) Patient left: in chair;with call bell/phone within reach   PT Visit Diagnosis: Pain;History of falling (Z91.81);Unsteadiness on feet (R26.81) Pain - Right/Left: Left Pain - part of body: Leg    Time: 8786-7672 PT Time Calculation (min) (ACUTE ONLY): 31 min   Charges:   PT Evaluation $PT Eval Low Complexity: 1 Low     PT G CodesKenyon Ana, PT Pager: [phone] 03/29/2017   [facility] 03/29/2017, 1:18 PM

## 2017-03-30 ENCOUNTER — Encounter (HOSPITAL_COMMUNITY): Payer: Self-pay | Admitting: Orthopedic Surgery

## 2017-03-30 LAB — CBC
HCT: 29.4 % — ABNORMAL LOW (ref 36.0–46.0)
Hemoglobin: 9.4 g/dL — ABNORMAL LOW (ref 12.0–15.0)
MCH: 28.4 pg (ref 26.0–34.0)
MCHC: 32 g/dL (ref 30.0–36.0)
MCV: 88.8 fL (ref 78.0–100.0)
Platelets: 209 10*3/uL (ref 150–400)
RBC: 3.31 MIL/uL — ABNORMAL LOW (ref 3.87–5.11)
RDW: 13.8 % (ref 11.5–15.5)
WBC: 9.6 10*3/uL (ref 4.0–10.5)

## 2017-03-30 NOTE — Clinical Social Work Note (Signed)
Clinical Social Work Assessment  Patient Details  Name: Barbara [name] MRN: [id number] Date of Birth: 12/03/1950  Date of referral:  03/30/17               Reason for consult:  Facility Placement                Permission sought to share information with:  Chartered certified accountant granted to share information::  Yes, Verbal Permission Granted  Name::     Leasia Swann  Agency::  SNF  Relationship::  spouse  Contact Information:     Housing/Transportation Living arrangements for the past 2 months:  [place] of Information:  Patient, Spouse Patient Interpreter Needed:  None Criminal Activity/Legal Involvement Pertinent to Current Situation/Hospitalization:  No - Comment as needed Significant Relationships:  Spouse, Other Family Members Lives with:  Spouse Do you feel safe going back to the place where you live?  No Need for family participation in patient care:  Yes (Comment)  Care giving concerns:  Pt from home with spouse and was independent prior to hospitalization. Pt will need short term rehab and pt and spouse in agreement with clinical recommendations.  CSW discused SNF process and options. Pt has never experienced SNF and has been involved with other family members when placement needed. Patient and spouse voiced understanding and all questions answered.  CSW obtained permission to send to SNF in [place] and family interested in [place].  CSW will f/u.  Social Worker assessment / plan:  CSW will assist with providing SNF offers to patient. CSW will assist with disposition.   Employment status:  Retired Forensic scientist:    PT Recommendations:  [place] / Referral to community resources:  [place]  Patient/Family's Response to care:  Patient/spouse thanked CSW for meeting and discussing disposition. No other issues or concerns identified.  Patient/Family's Understanding of and  Emotional Response to Diagnosis, Current Treatment, and Prognosis:  Patient/spouse has good understanding of impairment and acknowledge that spouse will be unable to care her her at discharge as he will also have some pending medical care needed. Pt has steps to get into home and has two level home. It would be difficult for patient to return home at this time and receive care, per patient/family. They are in agreement with receiving short term rehab at discharge from hospital. Pt concerned about home equipment when completed care at SNF.  CSW advised that patient will get assistance by nursing facility for home needs prior to discharge.  Patient/spouse in agreement and voiced understanding.  No other issues or concerns identified.  Emotional Assessment Appearance:  Appears stated age Attitude/Demeanor/Rapport:  (Cooperative) Affect (typically observed):  Accepting, Appropriate Orientation:  Oriented to Situation, Oriented to  Time, Oriented to Place, Oriented to Self Alcohol / Substance use:  Not Applicable Psych involvement (Current and /or in the community):  No (Comment)  Discharge Needs  Concerns to be addressed:  Discharge Planning Concerns Readmission within the last 30 days:  No Current discharge risk:  Dependent with Mobility, Physical Impairment Barriers to Discharge:  No Barriers Identified   Normajean Baxter, LCSW 03/30/2017, 10:50 AM

## 2017-03-30 NOTE — NC FL2 (Signed)
[place] LEVEL OF CARE SCREENING TOOL     IDENTIFICATION  Patient Name: Barbara [name] Birthdate: November 06, 1950 Sex: female Admission Date (Current Location): 03/28/2017  [facility] and [place] Number:  Herbalist and Address:  [facility], [place] [address], [place], [place] [address]      Provider Number: [id number]  Attending Physician Name and Address:  Renette Butters, MD  Relative Name and Phone Number:  Vikkie Goeden    Current Level of Care: Hospital Recommended Level of Care: [place] Prior Approval Number:    Date Approved/Denied:   PASRR Number: [id number]  Discharge Plan: SNF    Current Diagnoses: Patient Active Problem List   Diagnosis Date Noted  . Closed left tibial fracture 03/28/2017  . Closed fracture of left tibial plateau 03/28/2017  . Nonintractable headache 01/25/2017  . Visual field defect   . Chest pain 09/16/2016  . Essential tremor 09/16/2016  . Right facial numbness 09/16/2016  . Bilateral scleritis 08/29/2016  . Morbid obesity due to excess calories ([place]) 06/01/2016  . Dyspnea on exertion 05/13/2016  . Hoarseness 03/17/2016  . Gastroesophageal reflux disease without esophagitis 03/17/2016  . Globus sensation 03/17/2016  . Gastritis 02/25/2016  . Atypical chest pain 02/24/2016  . Painful orthopaedic hardware ([place]) 04/15/2013  . VITAMIN D DEFICIENCY 12/09/2009  . HYPERCHOLESTEROLEMIA 12/09/2009  . Essential hypertension 12/09/2009  . Allergic rhinitis 12/09/2009  . NEPHROLITHIASIS 12/09/2009  . CERVICAL POLYP 12/09/2009  . OSTEOARTHRITIS 12/09/2009  . Cough variant asthma vs UACS  12/09/2009    Orientation RESPIRATION BLADDER Height & Weight     Self, Time, Situation, Place  Normal Continent Weight:   Height:     BEHAVIORAL SYMPTOMS/MOOD NEUROLOGICAL BOWEL NUTRITION STATUS      Continent Diet(See DC Summary)  AMBULATORY STATUS COMMUNICATION OF NEEDS Skin   Extensive  Assist Verbally Surgical wounds                       Personal Care Assistance Level of Assistance  Dressing, Bathing, Feeding Bathing Assistance: Maximum assistance Feeding assistance: Limited assistance Dressing Assistance: Maximum assistance     Functional Limitations Info  Sight, Hearing, Speech Sight Info: Adequate Hearing Info: Adequate Speech Info: Adequate    SPECIAL CARE FACTORS FREQUENCY  PT (By licensed PT), OT (By licensed OT)     PT Frequency: 5x week OT Frequency: 5x week            Contractures      Additional Factors Info  Code Status, Allergies Code Status Info: Full Allergies Info: ACE INHIBITORS, AMOXICILLIN, AZATHIOPRINE, CELEBREX CELECOXIB, ENALAPRIL, FENTANYL, FISH OIL, GABAPENTIN, LANOLIN ACID LANOLIN, LEVOFLOXACIN, METHOTREXATE DERIVATIVES, NICKEL, OTHER, PENICILLINS, PREDNISONE, PROPOXYPHENE HCL, SULFONAMIDE DERIVATIVES, SYNVISC HYLAN G-F 20            Current Medications (03/30/2017):  This is the current hospital active medication list Current Facility-Administered Medications  Medication Dose Route Frequency Provider Last Rate Last Dose  . acetaminophen (TYLENOL) tablet 650 mg  650 mg Oral Q4H PRN Prudencio Burly III, PA-C   650 mg at 03/28/17 1759   Or  . acetaminophen (TYLENOL) suppository 650 mg  650 mg Rectal Q4H PRN Prudencio Burly III, PA-C      . acetaminophen (TYLENOL) tablet 1,000 mg  1,000 mg Oral Q8H Martensen, Charna Elizabeth III, PA-C   1,000 mg at 03/30/17 0530  . amLODipine (NORVASC) tablet 2.5 mg  2.5 mg Oral Daily  Prudencio Burly III, PA-C   2.5 mg at 03/30/17 1017  . diphenhydrAMINE (BENADRYL) 12.5 MG/5ML elixir 12.5-25 mg  12.5-25 mg Oral Q4H PRN Prudencio Burly III, PA-C      . docusate sodium (COLACE) capsule 100 mg  100 mg Oral BID Prudencio Burly III, PA-C   100 mg at 03/30/17 1017  . enoxaparin (LOVENOX) injection 40 mg  40 mg Subcutaneous Q24H Prudencio Burly III, PA-C    40 mg at 03/30/17 1016  . folic acid (FOLVITE) tablet 1 mg  1 mg Oral Daily Prudencio Burly III, PA-C   1 mg at 03/30/17 1018  . HYDROmorphone (DILAUDID) injection 1 mg  1 mg Intravenous Q3H PRN Prudencio Burly III, PA-C      . ketorolac (TORADOL) 15 MG/ML injection 7.5 mg  7.5 mg Intravenous Q6H PRN Prudencio Burly III, PA-C      . lactated ringers infusion   Intravenous Continuous Prudencio Burly III, PA-C 100 mL/hr at 03/29/17 0458    . loratadine (CLARITIN) tablet 10 mg  10 mg Oral Daily PRN Renette Butters, MD      . methocarbamol (ROBAXIN) tablet 500 mg  500 mg Oral Q6H PRN Prudencio Burly III, PA-C   500 mg at 03/29/17 6295   Or  . methocarbamol (ROBAXIN) 500 mg in dextrose 5 % 50 mL IVPB  500 mg Intravenous Q6H PRN Prudencio Burly III, PA-C      . metoCLOPramide (REGLAN) tablet 5-10 mg  5-10 mg Oral Q8H PRN Prudencio Burly III, PA-C       Or  . metoCLOPramide (REGLAN) injection 5-10 mg  5-10 mg Intravenous Q8H PRN Prudencio Burly III, PA-C      . ondansetron (ZOFRAN) tablet 4 mg  4 mg Oral Q6H PRN Prudencio Burly III, PA-C       Or  . ondansetron (ZOFRAN) injection 4 mg  4 mg Intravenous Q6H PRN Prudencio Burly III, PA-C   4 mg at 03/29/17 1844  . pantoprazole (PROTONIX) EC tablet 40 mg  40 mg Oral Daily Prudencio Burly III, PA-C   40 mg at 03/30/17 1017  . polyethylene glycol (MIRALAX / GLYCOLAX) packet 17 g  17 g Oral Daily PRN Prudencio Burly III, PA-C      . propranolol (INDERAL) tablet 10 mg  10 mg Oral BID Prudencio Burly III, PA-C   10 mg at 03/30/17 1017  . psyllium (HYDROCIL/METAMUCIL) packet 1 packet  1 packet Oral BID Prudencio Burly III, PA-C   1 packet at 03/30/17 1017  . senna (SENOKOT) tablet 8.6 mg  1 tablet Oral BID Prudencio Burly III, PA-C   8.6 mg at 03/30/17 1016  . sorbitol 70 % solution 30 mL  30 mL Oral Daily PRN Prudencio Burly III,  PA-C      . traMADol Veatrice Bourbon) tablet 50 mg  50 mg Oral Q6H PRN Prudencio Burly III, PA-C   50 mg at 03/29/17 1730     Discharge Medications: Please see discharge summary for a list of discharge medications.  Relevant Imaging Results:  Relevant Lab Results:   Additional Information SS#: [id number]  [place], LCSW

## 2017-03-30 NOTE — Care Management (Signed)
Case manager spoke with patient and her husband concerning discharge plan. Patient says that she will need shortterm rehab and will discuss with Education officer, museum. Case manager informed patient that social worker will follow up.

## 2017-03-30 NOTE — Progress Notes (Signed)
Occupational Therapy Treatment Patient Details Name: Barbara [name] MRN: [id number] DOB: [birth year] Today's Date: 03/30/2017    History of present illness  67 year old female with who presents with left leg pain after fall down 2 steps at home, xrays=mildly displaced comminuted fx L tibial shaft; pt is s/p external fixation L tibial fx;   PMHx: hypertension, hyperlipidemia, L femur fx s/p ORIF and subsequent hardware removal , bil shoulder surgers   OT comments  Pt progressing towards goals. Completing functional mobility within room at RW level with MinA+2. Pt continues to require MaxA for toileting and LB ADLs, presenting with increased pain and decreased dynamic balance. Pt reporting spouse is unable to provide necessary assist at time of discharge. D/c plan updated to reflect as Pt now open to SNF level therapies. Given Pt's current level of assist feel this recommendation is appropriate. Will continue to follow acutely to maximize her safety and independence with ADLs and mobility.    Follow Up Recommendations  SNF;Supervision/Assistance - 24 hour    Equipment Recommendations  3 in 1 bedside commode          Precautions / Restrictions Precautions Precautions: Fall Restrictions Weight Bearing Restrictions: Yes LLE Weight Bearing: Non weight bearing       Mobility Bed Mobility Overal bed mobility: Needs Assistance Bed Mobility: Sit to Supine     Supine to sit: Min assist Sit to supine: Min assist   General bed mobility comments: assist to mobilize L LE   Transfers Overall transfer level: Needs assistance Equipment used: Rolling walker (2 wheeled) Transfers: Sit to/from [facility] Sit to Stand: Min assist;+2 safety/equipment;+2 physical assistance Stand pivot transfers: Min assist;+2 physical assistance;+2 safety/equipment       General transfer comment: cues for safe hand placement with carry over demonstrated; assist to steady and manage L LE with  postural changes    Balance Overall balance assessment: History of Falls         Standing balance support: During functional activity;Single extremity supported;Bilateral upper extremity supported Standing balance-Leahy Scale: Poor Standing balance comment: reliant on UE support                            ADL either performed or assessed with clinical judgement   ADL Overall ADL's : Needs assistance/impaired                         Toilet Transfer: Minimal assistance;+2 for safety/equipment;+2 for physical assistance;BSC;RW;Ambulation Toilet Transfer Details (indicate cue type and reason): BSC over toilet  Toileting- Clothing Manipulation and Hygiene: Maximal assistance;Sit to/from stand;+2 for safety/equipment;+2 for physical assistance Toileting - Clothing Manipulation Details (indicate cue type and reason): Pt able to maintain standing with MinA while additional therapist assists with peri-care      Functional mobility during ADLs: Minimal assistance;+2 for physical assistance;+2 for safety/equipment;Rolling walker                         Cognition Arousal/Alertness: Awake/alert Behavior During Therapy: WFL for tasks assessed/performed Overall Cognitive Status: Within Functional Limits for tasks assessed  Pertinent Vitals/ Pain       Pain Assessment: Faces Faces Pain Scale: Hurts even more Pain Location: LLE (thigh/groin cramping) Pain Descriptors / Indicators: Grimacing;Guarding;Sore;Cramping Pain Intervention(s): Limited activity within patient's tolerance;Monitored during session;RN gave pain meds during session                                                          Frequency  Min 2X/week        Progress Toward Goals  OT Goals(current goals can now be found in the care plan section)  Progress towards OT goals:  Progressing toward goals  Acute Rehab OT Goals Patient Stated Goal: to go home if at all possible OT Goal Formulation: With patient Time For Goal Achievement: 04/12/17 Potential to Achieve Goals: Good  Plan Discharge plan needs to be updated    Co-evaluation    PT/OT/SLP Co-Evaluation/Treatment: Yes Reason for Co-Treatment: To address functional/ADL transfers;For patient/therapist safety PT goals addressed during session: Mobility/safety with mobility;Proper use of DME;Strengthening/ROM        AM-PAC PT "6 Clicks" Daily Activity     Outcome Measure   Help from another person eating meals?: None Help from another person taking care of personal grooming?: A Little Help from another person toileting, which includes using toliet, bedpan, or urinal?: A Lot Help from another person bathing (including washing, rinsing, drying)?: A Lot Help from another person to put on and taking off regular upper body clothing?: None Help from another person to put on and taking off regular lower body clothing?: A Lot 6 Click Score: 17    End of Session Equipment Utilized During Treatment: Gait belt;Rolling walker;Other (comment)(L ex-fix)  OT Visit Diagnosis: History of falling (Z91.81);Other abnormalities of gait and mobility (R26.89);Pain Pain - Right/Left: Left Pain - part of body: Leg;Ankle and joints of foot   Activity Tolerance Patient tolerated treatment well   Patient Left in bed;with call bell/phone within reach   Nurse Communication Mobility status        Time: 2952-8413 OT Time Calculation (min): 36 min  Charges: OT General Charges $OT Visit: 1 Visit OT Treatments $Self Care/Home Management : 8-22 mins  Lou Cal, OT Pager [phone] 03/30/2017    Raymondo Band 03/30/2017, 5:04 PM

## 2017-03-30 NOTE — Plan of Care (Signed)
  Progressing Education: Knowledge of General Education information will improve 03/30/2017 1546 - Progressing by Rance Muir, RN Health Behavior/Discharge Planning: Ability to manage health-related needs will improve 03/30/2017 1546 - Progressing by Rance Muir, RN Clinical Measurements: Ability to maintain clinical measurements within normal limits will improve 03/30/2017 1546 - Progressing by Rance Muir, RN Will remain free from infection 03/30/2017 1546 - Progressing by Rance Muir, RN Diagnostic test results will improve 03/30/2017 1546 - Progressing by Rance Muir, RN Respiratory complications will improve 03/30/2017 1546 - Progressing by Rance Muir, RN Cardiovascular complication will be avoided 03/30/2017 1546 - Progressing by Rance Muir, RN Activity: Risk for activity intolerance will decrease 03/30/2017 1546 - Progressing by Rance Muir, RN Nutrition: Adequate nutrition will be maintained 03/30/2017 1546 - Progressing by Rance Muir, RN Coping: Level of anxiety will decrease 03/30/2017 1546 - Progressing by Rance Muir, RN Elimination: Will not experience complications related to bowel motility 03/30/2017 1546 - Progressing by Rance Muir, RN Will not experience complications related to urinary retention 03/30/2017 1546 - Progressing by Rance Muir, RN Pain Managment: General experience of comfort will improve 03/30/2017 1546 - Progressing by Rance Muir, RN Safety: Ability to remain free from injury will improve 03/30/2017 1546 - Progressing by Rance Muir, RN Skin Integrity: Risk for impaired skin integrity will decrease 03/30/2017 1546 - Progressing by Rance Muir, RN

## 2017-03-30 NOTE — Social Work (Addendum)
CSW received a call from SNF advising that they have received Insurance Auth for SNF placement at [facility]  CSW f/u for disposition.  Elissa Hefty, LCSW Clinical Social Worker [phone]

## 2017-03-30 NOTE — Progress Notes (Signed)
Physical Therapy Treatment Patient Details Name: DAMIAN HOFSTRA MRN: [id number] DOB: [birth year] Today's Date: 03/30/2017    History of Present Illness  67 year old female with who presents with left leg pain after fall down 2 steps at home, xrays=mildly displaced comminuted fx L tibial shaft; pt is s/p external fixation L tibial fx;   PMHx: hypertension, hyperlipidemia, L femur fx s/p ORIF and subsequent hardware removal , bil shoulder surgers    PT Comments    Patient is making progress toward mobility goals however does demonstrate decreased activity tolerance and strength. Pt required +2 for managing NWB L LE with bed mobility and functional transfers. Husband cannot provide assistance due to upcoming hernia surgery. Recommending [facility]NF for further skilled PT services to maximize independence and safety with mobility.   Follow Up Recommendations  Supervision for mobility/OOB;SNF     Equipment Recommendations  Other (comment)(TBD next venue)    Recommendations for Other Services       Precautions / Restrictions Precautions Precautions: Fall Restrictions Weight Bearing Restrictions: Yes LLE Weight Bearing: Non weight bearing    Mobility  Bed Mobility Overal bed mobility: Needs Assistance Bed Mobility: Sit to Supine     Supine to sit: Min assist Sit to supine: Min assist   General bed mobility comments: assist to mobilize L LE   Transfers Overall transfer level: Needs assistance Equipment used: Rolling walker (2 wheeled) Transfers: Sit to/from [facility] Sit to Stand: Min assist;+2 safety/equipment;+2 physical assistance Stand pivot transfers: Min assist;+2 physical assistance;+2 safety/equipment       General transfer comment: cues for safe hand placement with carry over demonstrated; assist to steady and manage L LE with postural changes  Ambulation/Gait Ambulation/Gait assistance: Min assist(chair follow) Ambulation Distance (Feet): (42ft, 76ft,  then 52ft) Assistive device: Rolling walker (2 wheeled) Gait Pattern/deviations: Trunk flexed;Step-to pattern Gait velocity: decreased   General Gait Details: cues for forward gaze/posture; pt with safe use of AD and good ability to maintain NWB status   Stairs            Wheelchair Mobility    Modified Rankin (Stroke Patients Only)       Balance Overall balance assessment: History of Falls         Standing balance support: During functional activity;Single extremity supported;Bilateral upper extremity supported Standing balance-Leahy Scale: Poor Standing balance comment: reliant on UE support                             Cognition Arousal/Alertness: Awake/alert Behavior During Therapy: WFL for tasks assessed/performed Overall Cognitive Status: Within Functional Limits for tasks assessed                                        Exercises      General Comments        Pertinent Vitals/Pain Pain Assessment: Faces Faces Pain Scale: Hurts even more Pain Location: LLE (thigh/groin cramping) Pain Descriptors / Indicators: Grimacing;Guarding;Sore;Cramping Pain Intervention(s): Limited activity within patient's tolerance;Monitored during session;Repositioned;RN gave pain meds during session    Home Living                      Prior Function            PT Goals (current goals can now be found in the care plan section) Acute Rehab PT Goals PT  Goal Formulation: With patient Time For Goal Achievement: 04/05/17 Potential to Achieve Goals: Good Progress towards PT goals: Progressing toward goals    Frequency    Min 6X/week      PT Plan Discharge plan needs to be updated    Co-evaluation PT/OT/SLP Co-Evaluation/Treatment: Yes Reason for Co-Treatment: To address functional/ADL transfers;For patient/therapist safety PT goals addressed during session: Mobility/safety with mobility;Proper use of DME;Strengthening/ROM         AM-PAC PT "6 Clicks" Daily Activity  Outcome Measure  Difficulty turning over in bed (including adjusting bedclothes, sheets and blankets)?: Unable Difficulty moving from lying on back to sitting on the side of the bed? : Unable Difficulty sitting down on and standing up from a chair with arms (e.g., wheelchair, bedside commode, etc,.)?: Unable Help needed moving to and from a bed to chair (including a wheelchair)?: A Little Help needed walking in hospital room?: A Little Help needed climbing 3-5 steps with a railing? : A Lot 6 Click Score: 11    End of Session Equipment Utilized During Treatment: Gait belt Activity Tolerance: Patient tolerated treatment well Patient left: in bed;with call bell/phone within reach;with SCD's reapplied Nurse Communication: Mobility status PT Visit Diagnosis: Pain;History of falling (Z91.81);Unsteadiness on feet (R26.81) Pain - Right/Left: Left Pain - part of body: Leg     Time: 0034-9179 PT Time Calculation (min) (ACUTE ONLY): 36 min  Charges:  $Gait Training: 8-22 mins                    G Codes:       Earney Navy, PTA Pager: [phone]     Darliss Cheney 03/30/2017, 4:10 PM

## 2017-03-30 NOTE — Social Work (Signed)
CSW discussed SNF bed offers with patient and spouse. Patient has accepted bed offer from [facility].  Pt indicated that they were being discharged over the weekend. CSW discussed with RN and she will f/u for clarification and will advise family of discharge day.   CSW contacted [place] and confirmed bed offer. SNF will obtain Insurance Auth for placement.  CSW will f/u for disposition.  Elissa Hefty, LCSW Clinical Social Worker [phone]

## 2017-03-30 NOTE — Progress Notes (Signed)
   Assessment / Plan: 2 Days Post-Op  S/P Procedure(s) (LRB): EXTERNAL FIXATION LEG (Left) by Dr. Ernesta Amble. Percell Miller and Dr. Doreatha Martin on 03/28/2017  Principal Problem:   Closed left tibial fracture Active Problems:   Essential hypertension   Closed fracture of left tibial plateau   Closed left tibial shaft, plateau fracture Doing well postop day 2. Pain under decent control with Tylenol.  Tramadol may have caused some nausea yesterday - this has improved. Dressings changed this morning   Mobilize with therapy Incentive Spirometry Elevate and Apply ice  Weight Bearing: Non Weight Bearing (NWB) LLE Dressings: Reinforce as needed-possibly will change 03/30/2017.   VTE prophylaxis: Lovenox, SCD, ambulation Dispo: Therapy evaluations ongoing.  Patient and husband thinking that short stay in SNF may be best/safest option at this point, but she will work with therapy and discuss with social work today.  Okay for d/c today if placement is arranged.  Subjective: Patient reports pain as moderate.  Tolerating diet.  Urinating.  No CP, SOB.  Not yet OOB.  Objective:   VITALS:   Vitals:   03/29/17 0515 03/29/17 1300 03/29/17 1947 03/30/17 0520  BP: 130/80 125/71 124/66 121/63  Pulse: 90 91 85 84  Resp: 16 16 16    Temp: 98.2 F (36.8 C) 98.3 F (36.8 C) 98.3 F (36.8 C) 99 F (37.2 C)  TempSrc: Oral Oral Oral Oral  SpO2: 95% 96% 95% 96%   CBC Latest Ref Rng & Units 03/28/2017 09/18/2016 09/17/2016  WBC 4.0 - 10.5 K/uL 9.4 7.7 9.3  Hemoglobin 12.0 - 15.0 g/dL 11.4(L) 12.9 12.5  Hematocrit 36.0 - 46.0 % 34.7(L) 40.6 39.9  Platelets 150 - 400 K/uL 236 255 260   BMP Latest Ref Rng & Units 03/28/2017 09/18/2016 09/17/2016  Glucose 65 - 99 mg/dL 132(H) 101(H) 93  BUN 6 - 20 mg/dL 13 13 14   Creatinine 0.44 - 1.00 mg/dL 0.70 0.89 0.86  Sodium 135 - 145 mmol/L 139 138 140  Potassium 3.5 - 5.1 mmol/L 3.7 3.9 3.9  Chloride 101 - 111 mmol/L 108 104 107  CO2 22 - 32 mmol/L 22 29 27   Calcium 8.9 -  10.3 mg/dL 8.0(L) 8.7(L) 8.4(L)   Intake/Output      01/17 0701 - 01/18 0700 01/18 0701 - 01/19 0700   P.O. 480    I.V.     Total Intake 480    Urine     Blood     Total Output     Net +480         Urine Occurrence 2 x       Physical Exam: General: NAD.  Upright in bed.  Calm, conversant. MSK Compartments compressible. Neurovascularly intact Sensation intact distally Intact pulses distally Dorsiflexion/Plantar flexion intact Incision: dressings changed this morning.   Prudencio Burly III, PA-C 03/30/2017, 7:39 AM

## 2017-03-31 ENCOUNTER — Encounter (HOSPITAL_COMMUNITY): Payer: Self-pay | Admitting: Orthopedic Surgery

## 2017-03-31 DIAGNOSIS — Z9109 Other allergy status, other than to drugs and biological substances: Secondary | ICD-10-CM

## 2017-03-31 HISTORY — DX: Other allergy status, other than to drugs and biological substances: Z91.09

## 2017-03-31 MED ORDER — CYCLOBENZAPRINE HCL 5 MG PO TABS: 5.0000 mg | ORAL_TABLET | Freq: Three times a day (TID) | ORAL | 0 refills | Status: DC | PRN

## 2017-03-31 MED ORDER — CYCLOBENZAPRINE HCL 10 MG PO TABS
5.0000 mg | ORAL_TABLET | Freq: Once | ORAL | Status: AC
  Administered 2017-03-31: 5 mg via ORAL
  Filled 2017-03-31: qty 1

## 2017-03-31 MED ORDER — CYCLOBENZAPRINE HCL 10 MG PO TABS
5.0000 mg | ORAL_TABLET | Freq: Three times a day (TID) | ORAL | Status: DC | PRN
  Administered 2017-03-31: 5 mg via ORAL
  Filled 2017-03-31: qty 1

## 2017-03-31 NOTE — Progress Notes (Signed)
Report called to [place] at [place]. All questions/concerns addressed. IV removed. VSS. Awaiting [facility] transport for discharge.

## 2017-03-31 NOTE — Progress Notes (Signed)
[facility]thopedic [facility]uma Service Progress Note Weekend Coverage   Patient ID: DANYLE BOENING MRN: [id number] DOB/AGE: [birth year] 67 y.o.  Subjective:  Doing well this morning Sitting in bedside chair  Ready to go to skilled nursing center  Not tolerating Robaxin.  Making her quite nauseous  Patient has done due diligence in terms of picking up her metal allergy.  Patient and her husband showed me the results of her previous metal allergy test which shows she is highly reactive to nickel. 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  She still has some vision problems but her symptoms nearly resolved immediately 2 days after removal of the nail.  Pain is tolerable on Tylenol Patient is highly sensitive to opioids  No other complaints this morning   Review of Systems  Constitutional: Negative for chills and fever.  Respiratory: Negative for shortness of breath and wheezing.   Cardiovascular: Negative for chest pain and palpitations.  Gastrointestinal: Negative for nausea and vomiting.  Neurological: Negative for tingling and sensory change.    Objective:   VITALS:   Vitals:   03/30/17 1924 03/30/17 2245 03/31/17 0438 03/31/17 0945  BP: 123/67 118/60 (!) 143/78 140/80  Pulse: 91 92 85 92  Resp:      Temp: 98.4 F (36.9 C)  98.6 F (37 C)   TempSrc: Oral  Oral   SpO2: 93%  94%     Estimated body mass index is 34.2 kg/m as calculated from the following:   Height as of 01/26/17: 5\' 2"  (1.575 m).   Weight as of 01/26/17: 84.8 kg (187 lb).   Intake/Output      01/18 0701 - 01/19 0700 01/19 0701 - 01/20 0700   P.O. 480    Total Intake 480    Net +480         Urine Occurrence 3 x      LABS  No results found for this or any previous visit (from the past 24 hour(s)).   PHYSICAL EXAM:   Gen: Awake and alert, resting comfortably in bed, no acute distress, husband is at  bedside Lungs: Breathing is unlabored, lungs are clear Cardiac: Regular rate and rhythm Ext:     Left lower extremity    Dressings are saturated, dressings were removed  Surgical wounds are healing nicely, scant serosanguineous drainage  Pin sites look good, no erythema  DPN, SPN, TN sensory functions are intact  EHL, FHL, anterior tibialis, posterior tibialis, peroneals and gastrocsoleus complex motor functions are grossly intact  Swelling is moderate.  Moderate ecchymosis to the left lower leg  Extremity is warm  + DP pulse  Compartments are soft and nontender, no pain with passive stretching  Assessment/Plan: 3 Days Post-Op   Principal Problem:   Closed left tibial fracture Active Problems:   Essential hypertension   Closed fracture of left tibial plateau   Anti-infectives (From admission, onward)   Start     Dose/Rate Route Frequency Ordered Stop   03/29/17 0000  vancomycin (VANCOCIN) IVPB 1000 mg/200 mL premix     1,000 mg 200 mL/hr over 60 Minutes Intravenous Every 12 hours 03/28/17 1547 03/29/17 0015   03/28/17 0900  vancomycin (VANCOCIN) IVPB 1000 mg/200 mL premix     1,000 mg 200 mL/hr over 60 Minutes Intravenous On call to O.R. 03/28/17 4098 03/28/17 1417    .  POD/HD#: 31  67 year old female status post fall with closed left tibial  shaft and tibial plateau fractures  -Fall  -Closed left tibial shaft and tibial plateau fractures treated with limited percutaneous fixation and external fixation due to severe nickel allergy  Nonweightbearing left leg  Range of motion tolerated left knee and ankle  Skilled nursing center for further therapy  Continue with ice and elevation for swelling and pain control   Dressings were changed today.  Reviewed pin care and dressing care with husband and patient  New Ace wrap applied   Dressing changes starting on 04/02/2017   See pin care instructions below Discharge Pin Site Instructions  Dress pins daily with Kerlix roll  starting on POD 2. Wrap the Kerlix so that it tamps the skin down around the pin-skin interface to prevent/limit motion of the skin relative to the pin.  (Pin-skin motion is the primary cause of pain and infection related to external fixator pin sites).  Remove any crust or coagulum that may obstruct drainage with a saline moistened gauze or soap and water.  After POD 3, if there is no discernable drainage on the pin site dressing, the interval for change can by increased to every other day.  You may shower with the fixator, cleaning all pin sites gently with soap and water.  If you have a surgical wound this needs to be completely dry and without drainage before showering.  The extremity can be lifted by the fixator to facilitate wound care and transfers.  Notify the office/Doctor if you experience increasing drainage, redness, or pain from a pin site, or if you notice purulent (thick, snot-like) drainage.  - Pain management:  Primarily Tylenol  Really intolerant of opioids  Tramadol for severe breakthrough pain  - ABL anemia/Hemodynamics  Stable  Blood pressure and heart rate look good  - Medical issues   Home meds  - DVT/PE prophylaxis:  Lovenox x 30 days  - Activity:  Nonweightbearing left leg  Up with assistance  - FEN/GI prophylaxis/Foley/Lines:  Diet as tolerated  -Ex-fix/Splint care:  See instructions above for external fixator care  - Dispo:  Discharged to skilled nursing center today   Follow-up with Dr. Percell Miller in 10 days, call to schedule appointment   Jari Pigg, PA-C [facility] [phone] ([phone] (O) [phone] (C) 03/31/2017, 9:50 AM

## 2017-03-31 NOTE — Clinical Social Work Placement (Signed)
Nurse to call report to [phone], Room 610A.  Transport to be set up after PT Session.    CLINICAL SOCIAL WORK PLACEMENT  NOTE  Date:  03/31/2017  Patient Details  Name: Barbara [name] MRN: [id number] Date of Birth: 1950/03/24  Clinical Social Work is seeking post-discharge placement for this patient at the [place] level of care (*CSW will initial, date and re-position this form in  chart as items are completed):  Yes   Patient/family provided with [facility] Work Department's list of facilities offering this level of care within the geographic area requested by the patient (or if unable, by the patient's family).  Yes   Patient/family informed of their freedom to choose among providers that offer the needed level of care, that participate in Medicare, Medicaid or managed care program needed by the patient, have an available bed and are willing to accept the patient.  Yes   Patient/family informed of [facility]'s ownership interest in [facility] and [facility], as well as of the fact that they are under no obligation to receive care at these facilities.  PASRR submitted to EDS on       PASRR number received on 03/30/17     Existing PASRR number confirmed on       FL2 transmitted to all facilities in geographic area requested by pt/family on 03/30/17     FL2 transmitted to all facilities within larger geographic area on       Patient informed that his/her managed care company has contracts with or will negotiate with certain facilities, including the following:        Yes   Patient/family informed of bed offers received.  Patient chooses bed at [facility]     Physician recommends and patient chooses bed at      Patient to be transferred to [facility] on 03/31/17.  Patient to be transferred to facility by [facility]     Patient family notified on 03/31/17 of transfer.  Name of family member notified:  spouse advised      PHYSICIAN       Additional Comment:    _______________________________________________ Geralynn Ochs, LCSW 03/31/2017, 11:53 AM

## 2017-03-31 NOTE — Discharge Summary (Signed)
[facility] (OTS)  Patient ID: Barbara Barbara [name] Barbara [name] MRN: [id number] DOB/AGE: [birth year] 68 y.o.  Admit date: 03/28/2017 Discharge date: 03/31/2017  Admission Diagnoses: Closed left tibia fracture  closed tibial plateau fracture Nickel allergy GERD Vitamin D deficiency Obesity Hypertension  Discharge Diagnoses:  Principal Problem:   Closed left tibial fracture Active Problems:   Vitamin D deficiency   Essential hypertension   Gastroesophageal reflux disease without esophagitis   Obesity   Closed fracture of left tibial plateau   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, nickel    Past Medical History:  Diagnosis Date  . 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 Anxiety   . Arthritis    knees  . Cancer ([place])    basal cell- nose  . GERD (gastroesophageal reflux disease)   . Hyperlipidemia   . Hypertension   . Meningioma ([place])   . Neuromuscular disorder ([place])    benign tremor- takes Propanolol  . PONV (postoperative nausea and vomiting)    "BP DROPS ALSO"  . Trigeminal neuralgia of right side of face      Procedures Performed: 03/28/2017-Dr. Percell Miller  Operative report is pending  Discharged Condition: good  Hospital Course:   67 year old white female admitted on 03/28/2017 with left tibial shaft and left tibial plateau fracture.  This was sustained after falling.  Patient was seen and evaluated and admitted to orthopedics.  She has an extensive history of a nickel allergy and due to this patient refused internal hardware.  Lengthy discussion was had and patient finally was agreeable to external fixator with limited percutaneous fixation of her fracture.  Risks and benefits were clearly discussed with the patient.  She was taken to the operating room on 03/28/2017 for surgery.  Patient tolerated the procedure well.  After surgery she was transferred to the PACU for recovery from anesthesia and then transferred to the orthopedic floor for observation, pain control and therapies.   Patient's hospital stay was relatively uncomplicated other than the fact that she was somewhat slow to mobilize.  After several therapy sessions it was felt that she would benefit from a short-term stay at a skilled nursing center for additional therapy.  Patient was covered with Lovenox for DVT and PE prophylaxis during her stay.  She also received routine perioperative antibiotics.  Her pain was controlled with Tylenol.  On a rare occasion she did have opioids they made her immediately feel ill.  She was also somewhat intolerant of Robaxin as this made her quite nauseous as well.  On postoperative day #3 patient was deemed to be stable to discharge to skilled nursing center.  She was tolerating a regular diet and voiding without difficulty.  Consults: None  Significant Diagnostic Studies: labs:   Results for Barbara Barbara [name], Barbara [name] (MRN [id number]) as of 03/31/2017 10:02  Ref. Range 03/30/2017 07:32  WBC Latest Ref Range: 4.0 - 10.5 K/uL 9.6  RBC Latest Ref Range: 3.87 - 5.11 MIL/uL 3.31 (L)  Hemoglobin Latest Ref Range: 12.0 - 15.0 g/dL 9.4 (L)  HCT Latest Ref Range: 36.0 - 46.0 % 29.4 (L)  MCV Latest Ref Range: 78.0 - 100.0 fL 88.8  MCH Latest Ref Range: 26.0 - 34.0 pg 28.4  MCHC Latest Ref Range: 30.0 - 36.0 g/dL 32.0  RDW Latest Ref Range: 11.5 - 15.5 % 13.8  Platelets Latest Ref Range: 150 - 400 K/uL 209   Treatments: IV hydration, antibiotics: vancomycin, analgesia: acetaminophen and tramadol, anticoagulation: LMW heparin, therapies: PT, OT, RN and SW and surgery: As  above  Discharge Exam:       Orthopedic Trauma Service Progress Note Weekend Coverage    Patient ID: Barbara Barbara [name] Barbara [name] MRN: [id number] DOB/AGE: 11-Feb-1951 67 y.o.   Subjective:   Doing well this morning Sitting in bedside chair   Ready to go to skilled nursing center   Not tolerating Robaxin.  Making her quite nauseous   Patient has done due diligence in terms of picking up her metal allergy.  Patient and her husband showed  me the results of her previous metal allergy test which shows she is highly reactive to nickel. 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  She still has some vision problems but her symptoms nearly resolved immediately 2 days after removal of the nail.   Pain is tolerable on Tylenol Patient is highly sensitive to opioids   No other complaints this morning     Review of Systems  Constitutional: Negative for chills and fever.  Respiratory: Negative for shortness of breath and wheezing.   Cardiovascular: Negative for chest pain and palpitations.  Gastrointestinal: Negative for nausea and vomiting.  Neurological: Negative for tingling and sensory change.      Objective:    VITALS:         Vitals:    03/30/17 1924 03/30/17 2245 03/31/17 0438 03/31/17 0945  BP: 123/67 118/60 (!) 143/78 140/80  Pulse: 91 92 85 92  Resp:          Temp: 98.4 F (36.9 C)   98.6 F (37 C)    TempSrc: Oral   Oral    SpO2: 93%   94%        Estimated body mass index is 34.2 kg/m as calculated from the following:   Height as of 01/26/17: 5\' 2"  (1.575 m).   Weight as of 01/26/17: 84.8 kg (187 lb).     Intake/Output      01/18 0701 - 01/19 0700 01/19 0701 - 01/20 0700   P.O. 480    Total Intake 480    Net +480         Urine Occurrence 3 x       LABS   Lab Results Last 24 Hours  No results found for this or any previous visit (from the past 24 hour(s)).       PHYSICAL EXAM:    Gen: Awake and alert, resting comfortably in bed, no acute distress, husband is at bedside Lungs: Breathing is unlabored, lungs are clear Cardiac: Regular rate and rhythm Ext:     Left lower extremity               Dressings are saturated, dressings were removed             Surgical wounds are healing nicely, scant serosanguineous drainage             Pin sites look good, no erythema             DPN, SPN, TN sensory functions are intact              EHL, FHL, anterior tibialis, posterior tibialis, peroneals and gastrocsoleus complex motor functions are grossly intact             Swelling is moderate.  Moderate ecchymosis to the left lower leg             Extremity is warm             +  DP pulse             Compartments are soft and nontender, no pain with passive stretching   Assessment/Plan: 3 Days Post-Op    Principal Problem:   Closed left tibial fracture Active Problems:   Essential hypertension   Closed fracture of left tibial plateau               Anti-infectives (From admission, onward)    Start     Dose/Rate Route Frequency Ordered Stop    03/29/17 0000   vancomycin (VANCOCIN) IVPB 1000 mg/200 mL premix     1,000 mg 200 mL/hr over 60 Minutes Intravenous Every 12 hours 03/28/17 1547 03/29/17 0015    03/28/17 0900   vancomycin (VANCOCIN) IVPB 1000 mg/200 mL premix     1,000 mg 200 mL/hr over 60 Minutes Intravenous On call to O.R. 03/28/17 4627 03/28/17 1417     .   POD/HD#: 84   67 year old female status post fall with closed left tibial shaft and tibial plateau fractures   -Fall   -Closed left tibial shaft and tibial plateau fractures treated with limited percutaneous fixation and external fixation due to severe nickel allergy             Nonweightbearing left leg             Range of motion tolerated left knee and ankle             Skilled nursing center for further therapy             Continue with ice and elevation for swelling and pain control               Dressings were changed today.  Reviewed pin care and dressing care with husband and patient             New Ace wrap applied               Dressing changes starting on 04/02/2017                         See pin care instructions below Discharge Pin Site Instructions   Dress pins daily with Kerlix roll starting on POD 2. Wrap the Kerlix so that it tamps the skin down around the pin-skin interface to prevent/limit motion of the skin relative to the pin.   (Pin-skin motion is the primary cause of pain and infection related to external fixator pin sites).   Remove any crust or coagulum that may obstruct drainage with a saline moistened gauze or soap and water.   After POD 3, if there is no discernable drainage on the pin site dressing, the interval for change can by increased to every other day.   You may shower with the fixator, cleaning all pin sites gently with soap and water.  If you have a surgical wound this needs to be completely dry and without drainage before showering.   The extremity can be lifted by the fixator to facilitate wound care and transfers.   Notify the office/Doctor if you experience increasing drainage, redness, or pain from a pin site, or if you notice purulent (thick, snot-like) drainage.   - Pain management:             Primarily Tylenol             Really intolerant of opioids  Tramadol for severe breakthrough pain   - ABL anemia/Hemodynamics             Stable             Blood pressure and heart rate look good   - Medical issues              Home meds   - DVT/PE prophylaxis:             Lovenox x 30 days   - Activity:             Nonweightbearing left leg             Up with assistance   - FEN/GI prophylaxis/Foley/Lines:             Diet as tolerated   -Ex-fix/Splint care:             See instructions above for external fixator care   - Dispo:             Discharged to skilled nursing center today              Follow-up with Dr. Percell Miller in 10 days, call to schedule appointment     Disposition: Skilled nursing  Allergies as of 03/31/2017      Reactions   Ace Inhibitors    Pt unsure- nausea or hives   Amoxicillin Hives   Azathioprine Other (See Comments)   "cardiac issues"   Celebrex [celecoxib] Other (See Comments)   Severe stomach cramps   Enalapril Other (See Comments)   congestion   Fentanyl Nausea And Vomiting   Fish Oil Other (See Comments)   Broke out with blisters    Gabapentin    Pain and swelling    Lanolin Acid [lanolin]    rash   Levofloxacin Other (See Comments)   "sore ankles"   Methotrexate Derivatives Other (See Comments)   "cardiac issues"   Nickel    rash   Other Nausea And Vomiting   Narcotic Pain Killers- vomitting, nausea constantly   Penicillins Hives   Has patient had a PCN reaction causing immediate rash, facial/tongue/throat swelling, SOB or lightheadedness with hypotension: unknown Has patient had a PCN reaction causing severe rash involving mucus membranes or skin necrosis: no Has patient had a PCN reaction that required hospitalization - no, was at MD office Has patient had a PCN reaction occurring within the last 10 years: no If all of the above answers are "NO", then may proceed with Cephalosporin use.   Prednisone Other (See Comments)   High blood pressure.   Propoxyphene Hcl Nausea Only   Sulfonamide Derivatives Nausea Only, Other (See Comments)   Stomach cramping   Synvisc [hylan G-f 20]    Made knee swell up at injection site      Medication List    TAKE these medications   acetaminophen 500 MG tablet Commonly known as:  TYLENOL Take 2 tablets (1,000 mg total) by mouth every 8 (eight) hours for 14 days. For Pain.   amLODipine 2.5 MG tablet Commonly known as:  NORVASC Take 1 tablet (2.5 mg total) daily by mouth.   cyclobenzaprine 5 MG tablet Commonly known as:  FLEXERIL Take 1 tablet (5 mg total) by mouth 3 (three) times daily as needed for muscle spasms.   docusate sodium 100 MG capsule Commonly known as:  COLACE Take 1 capsule (100 mg total) by mouth 2 (two) times daily. To prevent constipation while taking pain medication.  enoxaparin 40 MG/0.4ML injection Commonly known as:  LOVENOX Inject 0.4 mLs (40 mg total) into the skin daily for 30 doses. For 30 days post op for DVT prophylaxis   folic acid 1 MG tablet Commonly known as:  FOLVITE Take 1 mg by mouth daily.   levocetirizine 5 MG  tablet Commonly known as:  XYZAL Take 5 mg by mouth daily as needed for allergies.   milk thistle 175 MG tablet Take 175 mg by mouth daily.   omeprazole 40 MG capsule Commonly known as:  PRILOSEC Take 1 capsule (40 mg total) by mouth 2 (two) times daily.   ondansetron 4 MG tablet Commonly known as:  ZOFRAN Take 1 tablet (4 mg total) by mouth every 8 (eight) hours as needed for nausea or vomiting.   propranolol 10 MG tablet Commonly known as:  INDERAL Take 10 mg by mouth 2 (two) times daily.   psyllium 95 % Pack Commonly known as:  HYDROCIL/METAMUCIL Take 1 packet by mouth 2 (two) times daily.   Red Yeast Rice Extract 300 MG Caps Take 600 mg by mouth daily.   traMADol 50 MG tablet Commonly known as:  ULTRAM Take 1 tablet (50 mg total) by mouth every 6 (six) hours as needed for moderate pain.       Contact information for follow-up providers    Renette Butters, MD Follow up in 10 day(s).   Specialty:  Orthopedic Surgery Contact information: [address] [place] [address] [phone]            Contact information for after-discharge care    Destination    [facility] Follow up.   Service:  Skilled Nursing Contact information: [address] [place] [phone]                  Discharge Instructions and Plan:  67 year old female status post fall with closed left tibial shaft and tibial plateau fractures   -Fall   -Closed left tibial shaft and tibial plateau fractures treated with limited percutaneous fixation and external fixation due to severe nickel allergy             Nonweightbearing left leg             Range of motion tolerated left knee and ankle             Skilled nursing center for further therapy             Continue with ice and elevation for swelling and pain control               Dressings were changed today before discharge.  Reviewed pin care and dressing care with  husband and patient             New Ace wrap applied               Dressing changes starting on 04/02/2017                         See pin care instructions below Discharge Pin Site Instructions   Dress pins daily with Kerlix roll starting on POD 2. Wrap the Kerlix so that it tamps the skin down around the pin-skin interface to prevent/limit motion of the skin relative to the pin.  (Pin-skin motion is the primary cause of pain and infection related to external fixator pin sites).   Remove any crust or coagulum  that may obstruct drainage with a saline moistened gauze or soap and water.   After POD 3, if there is no discernable drainage on the pin site dressing, the interval for change can by increased to every other day.   You may shower with the fixator, cleaning all pin sites gently with soap and water.  If you have a surgical wound this needs to be completely dry and without drainage before showering.   The extremity can be lifted by the fixator to facilitate wound care and transfers.   Notify the office/Doctor if you experience increasing drainage, redness, or pain from a pin site, or if you notice purulent (thick, snot-like) drainage.   - Pain management:            Tylenol  Ultram for severe breakthrough pain   - ABL anemia/Hemodynamics             Stable             Blood pressure and heart rate look good   - Medical issues              Home meds   - DVT/PE prophylaxis:             Lovenox x 30 days   - Activity:             Nonweightbearing left leg             Up with assistance   - FEN/GI prophylaxis/Foley/Lines:             Diet as tolerated   -Ex-fix/Splint care:             See instructions above for external fixator care   - Dispo:             Discharged to skilled nursing center today              Follow-up with Dr. Percell Miller in 10 days, call to schedule appointment  Signed:  Jari Pigg, PA-C [facility] [phone] (P) 03/31/2017, 10:29  AM

## 2017-03-31 NOTE — Care Management Note (Signed)
Case Management Note  Patient Details  Name: Barbara [name] MRN: [id number] Date of Birth: [birth year]  Subjective/Objective:              Patient with order to discharge to [facility] (SNF). SNF discharge facilitated through [facility] ([place]). Please refer to [facility] notes for disposition plan and direct questions CSW on call accordingly. CM signing off      Action/Plan:   Expected Discharge Date:  03/31/17               Expected Discharge Plan:  [place]  In-House Referral:  Clinical Social Work  Discharge planning Services  CM Consult  Post Acute Care Choice:    Choice offered to:     DME Arranged:    DME Agency:     HH Arranged:    [place] Agency:     Status of Service:  Completed, signed off  If discussed at [employer], dates discussed:    Additional Comments:  Carles Collet, RN 03/31/2017, 10:33 AM

## 2017-04-02 NOTE — Op Note (Signed)
03/28/2017  7:58 AM  PATIENT:  Barbara [name]    PRE-OPERATIVE DIAGNOSIS:  left leg fracture  POST-OPERATIVE DIAGNOSIS:  Same  PROCEDURE:  EXTERNAL FIXATION LEG  SURGEON:  Christalynn Boise, Ernesta Amble, MD  ASSISTANT: Roxan Hockey, PA-C, he was present and scrubbed throughout the case, critical for completion in a timely fashion, and for retraction, instrumentation, and closure.   ANESTHESIA:   gen  PREOPERATIVE INDICATIONS:  Barbara [name] is a  67 y.o. female with a diagnosis of left leg fracture who failed conservative measures and elected for surgical management.    The risks benefits and alternatives were discussed with the patient preoperatively including but not limited to the risks of infection, bleeding, nerve injury, cardiopulmonary complications, the need for revision surgery, among others, and the patient was willing to proceed.  OPERATIVE IMPLANTS: stryker ex fix and cannulated screw  OPERATIVE FINDINGS: displaced palteau fx  BLOOD LOSS: min  COMPLICATIONS: none  TOURNIQUET TIME: none  OPERATIVE PROCEDURE:  Patient was identified in the preoperative holding area and site was marked by me She was transported to the operating theater and placed on the table in supine position taking care to pad all bony prominences. After a preincinduction time out anesthesia was induced. The left lower extremity was prepped and draped in normal sterile fashion and a pre-incision timeout was performed. She received vanc for preoperative antibiotics.   I examined her knee stability she was unstable to valgus with compression of her lateral plateau fracture.  At this point I made a lateral approach to her plateau fracture and open her lateral compartment to gain access to this fracture.  Used a tamp to elevate her lateral joint surface and injected Hydrocet to maintain this elevation I then placed a partially threaded cannulated screw with a washer to close down the split.  Next I assembled a  external fixator over top of her across her tibial shaft fracture.  I inserted a third pin proximally for X added stability and was happy with the alignment and stability on multiple x-rays.  Sterile dressings were applied after a closure of her lateral incision.  POST OPERATIVE PLAN: NWB, resume chemical dvt px

## 2017-04-12 ENCOUNTER — Ambulatory Visit: Payer: Medicare Other | Admitting: Internal Medicine

## 2017-04-20 ENCOUNTER — Ambulatory Visit: Payer: Medicare Other | Admitting: Internal Medicine

## 2017-04-30 NOTE — H&P (Signed)
[facility] [address] [place], [place] [address] [phone] A Division of [facility]  RE: Barbara [name], Barbara [name]   [id number]   26-Sep-2050  04-23-17 Reason for visit: Follow up plateau ORIF with a park screw and joint elevation and external fixator placement across a tibia fracture.   History of present illness: She has severe allergies to titanium and could not tolerate stainless steel either.  We are treating definitively with the ex-fix.  She also has right knee pain and is on Supartz number three.    EXAMINATION: Well appearing female in no apparent distress.  Pin sites are benign.  Incision is well healed.  Neurovascularly intact.    X-RAYS: X-rays show stable alignment.    ASSESSMENT/PLAN: Doing well at this time.  We are going to set her up for two weeks from now to remove her external fixator and remove the screw as well.  We place her in a boot.  I would love to use a bone stimulator to encourage healing.  We will also do Supartz injection number three to the right knee.  PROCEDURE NOTE: The patient's clinical condition is marked by substantial pain and/or significant functional disability.  Other conservative therapy has not provided relief, is contraindicated, or not appropriate.  There is a reasonable likelihood that injection will significantly improve the patient's pain and/or functional disability. Patient is seated on the exam table.  The right knee is prepped with Betadine and alcohol and injected with Supartz.  Patient tolerated the procedure without difficulty.    Ernesta Amble.  Percell Miller, M.D.  Electronically verified by Ernesta Amble. Percell Miller, M.D. TDM:jjh D 04-26-17 T 04-27-17

## 2017-05-02 ENCOUNTER — Encounter (HOSPITAL_BASED_OUTPATIENT_CLINIC_OR_DEPARTMENT_OTHER): Payer: Self-pay | Admitting: *Deleted

## 2017-05-02 ENCOUNTER — Other Ambulatory Visit: Payer: Self-pay

## 2017-05-08 ENCOUNTER — Encounter (HOSPITAL_BASED_OUTPATIENT_CLINIC_OR_DEPARTMENT_OTHER): Payer: Self-pay | Admitting: *Deleted

## 2017-05-08 ENCOUNTER — Ambulatory Visit (HOSPITAL_BASED_OUTPATIENT_CLINIC_OR_DEPARTMENT_OTHER): Payer: Medicare Other | Admitting: Anesthesiology

## 2017-05-08 ENCOUNTER — Ambulatory Visit (HOSPITAL_BASED_OUTPATIENT_CLINIC_OR_DEPARTMENT_OTHER)
Admission: RE | Admit: 2017-05-08 | Discharge: 2017-05-08 | Disposition: A | Discharge status: Home / Self Care | Payer: Medicare Other | Source: Ambulatory Visit | Attending: Orthopedic Surgery | Admitting: Orthopedic Surgery

## 2017-05-08 ENCOUNTER — Encounter (HOSPITAL_BASED_OUTPATIENT_CLINIC_OR_DEPARTMENT_OTHER)
Admission: RE | Disposition: A | Discharge status: Home / Self Care | Payer: Self-pay | Source: Ambulatory Visit | Attending: Orthopedic Surgery

## 2017-05-08 ENCOUNTER — Other Ambulatory Visit: Payer: Self-pay

## 2017-05-08 DIAGNOSIS — J45909 Unspecified asthma, uncomplicated: Secondary | ICD-10-CM | POA: Diagnosis not present

## 2017-05-08 DIAGNOSIS — R251 Tremor, unspecified: Secondary | ICD-10-CM | POA: Insufficient documentation

## 2017-05-08 DIAGNOSIS — K219 Gastro-esophageal reflux disease without esophagitis: Secondary | ICD-10-CM | POA: Insufficient documentation

## 2017-05-08 DIAGNOSIS — F419 Anxiety disorder, unspecified: Secondary | ICD-10-CM | POA: Diagnosis not present

## 2017-05-08 DIAGNOSIS — R51 Headache: Secondary | ICD-10-CM | POA: Insufficient documentation

## 2017-05-08 DIAGNOSIS — Z472 Encounter for removal of internal fixation device: Secondary | ICD-10-CM | POA: Diagnosis present

## 2017-05-08 DIAGNOSIS — M1990 Unspecified osteoarthritis, unspecified site: Secondary | ICD-10-CM | POA: Insufficient documentation

## 2017-05-08 DIAGNOSIS — I10 Essential (primary) hypertension: Secondary | ICD-10-CM | POA: Diagnosis not present

## 2017-05-08 DIAGNOSIS — S82192A Other fracture of upper end of left tibia, initial encounter for closed fracture: Secondary | ICD-10-CM

## 2017-05-08 HISTORY — PX: EXTERNAL FIXATION REMOVAL: SHX5040

## 2017-05-08 SURGERY — REMOVAL, EXTERNAL FIXATION DEVICE, LOWER EXTREMITY
Anesthesia: General | Site: Leg Lower | Laterality: Left

## 2017-05-08 MED ORDER — LACTATED RINGERS IV SOLN
INTRAVENOUS | Status: DC
  Administered 2017-05-08 (×2): via INTRAVENOUS

## 2017-05-08 MED ORDER — ONDANSETRON HCL 4 MG PO TABS: 4.0000 mg | ORAL_TABLET | Freq: Three times a day (TID) | ORAL | 0 refills | Status: DC | PRN

## 2017-05-08 MED ORDER — HYDROCODONE-ACETAMINOPHEN 5-325 MG PO TABS: 1.0000 | ORAL_TABLET | Freq: Four times a day (QID) | ORAL | 0 refills | Status: DC | PRN

## 2017-05-08 MED ORDER — VANCOMYCIN HCL IN DEXTROSE 1-5 GM/200ML-% IV SOLN
INTRAVENOUS | Status: AC
  Filled 2017-05-08: qty 200

## 2017-05-08 MED ORDER — ONDANSETRON HCL 4 MG/2ML IJ SOLN
INTRAMUSCULAR | Status: AC
  Filled 2017-05-08: qty 2

## 2017-05-08 MED ORDER — PHENYLEPHRINE HCL 10 MG/ML IJ SOLN
INTRAMUSCULAR | Status: DC | PRN
  Administered 2017-05-08: 80 ug via INTRAVENOUS

## 2017-05-08 MED ORDER — LIDOCAINE 2% (20 MG/ML) 5 ML SYRINGE
INTRAMUSCULAR | Status: DC | PRN
  Administered 2017-05-08: 60 mg via INTRAVENOUS

## 2017-05-08 MED ORDER — FENTANYL CITRATE (PF) 100 MCG/2ML IJ SOLN
INTRAMUSCULAR | Status: AC
  Filled 2017-05-08: qty 2

## 2017-05-08 MED ORDER — ONDANSETRON HCL 4 MG/2ML IJ SOLN
INTRAMUSCULAR | Status: DC | PRN
  Administered 2017-05-08: 4 mg via INTRAVENOUS

## 2017-05-08 MED ORDER — ONDANSETRON HCL 4 MG/2ML IJ SOLN: 4.0000 mg | Freq: Once | INTRAMUSCULAR | Status: DC | PRN

## 2017-05-08 MED ORDER — POVIDONE-IODINE 7.5 % EX SOLN: Freq: Once | CUTANEOUS | Status: DC

## 2017-05-08 MED ORDER — PROPOFOL 500 MG/50ML IV EMUL
INTRAVENOUS | Status: DC | PRN
  Administered 2017-05-08: 200 ug/kg/min via INTRAVENOUS

## 2017-05-08 MED ORDER — PROPOFOL 10 MG/ML IV BOLUS
INTRAVENOUS | Status: DC | PRN
  Administered 2017-05-08: 150 mg via INTRAVENOUS
  Administered 2017-05-08 (×2): 50 mg via INTRAVENOUS

## 2017-05-08 MED ORDER — VANCOMYCIN HCL IN DEXTROSE 1-5 GM/200ML-% IV SOLN
1000.0000 mg | INTRAVENOUS | Status: AC
  Administered 2017-05-08: 1000 mg via INTRAVENOUS

## 2017-05-08 MED ORDER — MIDAZOLAM HCL 2 MG/2ML IJ SOLN
1.0000 mg | INTRAMUSCULAR | Status: DC | PRN
  Administered 2017-05-08: 2 mg via INTRAVENOUS

## 2017-05-08 MED ORDER — FENTANYL CITRATE (PF) 100 MCG/2ML IJ SOLN: 50.0000 ug | INTRAMUSCULAR | Status: DC | PRN

## 2017-05-08 MED ORDER — DEXAMETHASONE SODIUM PHOSPHATE 10 MG/ML IJ SOLN
INTRAMUSCULAR | Status: AC
  Filled 2017-05-08: qty 1

## 2017-05-08 MED ORDER — PROPOFOL 10 MG/ML IV BOLUS
INTRAVENOUS | Status: AC
  Filled 2017-05-08: qty 20

## 2017-05-08 MED ORDER — MIDAZOLAM HCL 2 MG/2ML IJ SOLN
INTRAMUSCULAR | Status: AC
  Filled 2017-05-08: qty 2

## 2017-05-08 MED ORDER — ACETAMINOPHEN 500 MG PO TABS
ORAL_TABLET | ORAL | Status: AC
  Filled 2017-05-08: qty 2

## 2017-05-08 MED ORDER — ASPIRIN EC 81 MG PO TBEC: 81.0000 mg | DELAYED_RELEASE_TABLET | Freq: Every day | ORAL | 0 refills | Status: DC

## 2017-05-08 MED ORDER — ACETAMINOPHEN 500 MG PO TABS
1000.0000 mg | ORAL_TABLET | Freq: Once | ORAL | Status: AC
  Administered 2017-05-08: 1000 mg via ORAL

## 2017-05-08 MED ORDER — BUPIVACAINE HCL (PF) 0.5 % IJ SOLN
INTRAMUSCULAR | Status: AC
  Filled 2017-05-08: qty 60

## 2017-05-08 MED ORDER — LIDOCAINE 2% (20 MG/ML) 5 ML SYRINGE
INTRAMUSCULAR | Status: AC
Start: 2017-05-08 — End: ?
  Filled 2017-05-08: qty 5

## 2017-05-08 MED ORDER — LACTATED RINGERS IV SOLN
INTRAVENOUS | Status: DC
  Administered 2017-05-08: 10:00:00 via INTRAVENOUS

## 2017-05-08 MED ORDER — SCOPOLAMINE 1 MG/3DAYS TD PT72: 1.0000 | MEDICATED_PATCH | Freq: Once | TRANSDERMAL | Status: DC | PRN

## 2017-05-08 SURGICAL SUPPLY — 70 items
BANDAGE ACE 4X5 VEL STRL LF (GAUZE/BANDAGES/DRESSINGS) ×3 IMPLANT
BANDAGE ACE 6X5 VEL STRL LF (GAUZE/BANDAGES/DRESSINGS) ×2 IMPLANT
BLADE SURG 15 STRL LF DISP TIS (BLADE) ×1 IMPLANT
BLADE SURG 15 STRL SS (BLADE) ×3
BNDG CMPR 9X4 STRL LF SNTH (GAUZE/BANDAGES/DRESSINGS)
BNDG COHESIVE 4X5 TAN STRL (GAUZE/BANDAGES/DRESSINGS) ×3 IMPLANT
BNDG ESMARK 4X9 LF (GAUZE/BANDAGES/DRESSINGS) ×1 IMPLANT
CHLORAPREP W/TINT 26ML (MISCELLANEOUS) ×3 IMPLANT
CLOSURE STERI-STRIP 1/2X4 (GAUZE/BANDAGES/DRESSINGS)
CLSR STERI-STRIP ANTIMIC 1/2X4 (GAUZE/BANDAGES/DRESSINGS) ×1 IMPLANT
COVER BACK TABLE 60X90IN (DRAPES) ×3 IMPLANT
CUFF TOURNIQUET SINGLE 24IN (TOURNIQUET CUFF) IMPLANT
CUFF TOURNIQUET SINGLE 34IN LL (TOURNIQUET CUFF) IMPLANT
DECANTER SPIKE VIAL GLASS SM (MISCELLANEOUS) IMPLANT
DRAPE EXTREMITY T 121X128X90 (DRAPE) ×3 IMPLANT
DRAPE IMP U-DRAPE 54X76 (DRAPES) ×3 IMPLANT
DRAPE INCISE IOBAN 66X45 STRL (DRAPES) ×1 IMPLANT
DRAPE OEC MINIVIEW 54X84 (DRAPES) ×3 IMPLANT
DRAPE SURG 17X23 STRL (DRAPES) IMPLANT
DRAPE U-SHAPE 47X51 STRL (DRAPES) ×2 IMPLANT
DRSG EMULSION OIL 3X3 NADH (GAUZE/BANDAGES/DRESSINGS) ×3 IMPLANT
DRSG MEPILEX BORDER 4X8 (GAUZE/BANDAGES/DRESSINGS) IMPLANT
ELECT REM PT RETURN 9FT ADLT (ELECTROSURGICAL) ×3
ELECTRODE REM PT RTRN 9FT ADLT (ELECTROSURGICAL) ×1 IMPLANT
GAUZE SPONGE 4X4 12PLY STRL (GAUZE/BANDAGES/DRESSINGS) ×3 IMPLANT
GAUZE XEROFORM 1X8 LF (GAUZE/BANDAGES/DRESSINGS) IMPLANT
GLOVE BIO SURGEON STRL SZ7 (GLOVE) ×2 IMPLANT
GLOVE BIO SURGEON STRL SZ7.5 (GLOVE) ×6 IMPLANT
GLOVE BIOGEL PI IND STRL 8 (GLOVE) ×2 IMPLANT
GLOVE BIOGEL PI INDICATOR 8 (GLOVE) ×4
GOWN STRL REUS W/ TWL LRG LVL3 (GOWN DISPOSABLE) ×2 IMPLANT
GOWN STRL REUS W/ TWL XL LVL3 (GOWN DISPOSABLE) ×1 IMPLANT
GOWN STRL REUS W/TWL LRG LVL3 (GOWN DISPOSABLE)
GOWN STRL REUS W/TWL XL LVL3 (GOWN DISPOSABLE) ×6
IMMOBILIZER KNEE 22 UNIV (SOFTGOODS) ×2 IMPLANT
NDL HYPO 25X1 1.5 SAFETY (NEEDLE) ×1 IMPLANT
NEEDLE HYPO 25X1 1.5 SAFETY (NEEDLE) ×3 IMPLANT
NS IRRIG 1000ML POUR BTL (IV SOLUTION) ×3 IMPLANT
PACK BASIN DAY SURGERY FS (CUSTOM PROCEDURE TRAY) ×3 IMPLANT
PAD CAST 4YDX4 CTTN HI CHSV (CAST SUPPLIES) ×1 IMPLANT
PADDING CAST ABS 4INX4YD NS (CAST SUPPLIES) ×2
PADDING CAST ABS COTTON 4X4 ST (CAST SUPPLIES) ×1 IMPLANT
PADDING CAST COTTON 4X4 STRL (CAST SUPPLIES) ×3
PENCIL BUTTON HOLSTER BLD 10FT (ELECTRODE) ×3 IMPLANT
SHEET MEDIUM DRAPE 40X70 STRL (DRAPES) IMPLANT
SLEEVE SCD COMPRESS KNEE MED (MISCELLANEOUS) ×2 IMPLANT
SPONGE LAP 4X18 X RAY DECT (DISPOSABLE) ×3 IMPLANT
STOCKINETTE 4X48 STRL (DRAPES) IMPLANT
STOCKINETTE 6  STRL (DRAPES) ×2
STOCKINETTE 6 STRL (DRAPES) ×1 IMPLANT
STOCKINETTE IMPERVIOUS LG (DRAPES) ×1 IMPLANT
SUCTION FRAZIER HANDLE 10FR (MISCELLANEOUS)
SUCTION TUBE FRAZIER 10FR DISP (MISCELLANEOUS) IMPLANT
SUT ETHILON 3 0 PS 1 (SUTURE) ×2 IMPLANT
SUT MNCRL AB 4-0 PS2 18 (SUTURE) IMPLANT
SUT MON AB 2-0 CT1 36 (SUTURE) IMPLANT
SUT MON AB 4-0 PC3 18 (SUTURE) IMPLANT
SUT PROLENE 3 0 PS 2 (SUTURE) IMPLANT
SUT VIC AB 0 CT1 27 (SUTURE)
SUT VIC AB 0 CT1 27XCR 8 STRN (SUTURE) IMPLANT
SUT VIC AB 2-0 SH 27 (SUTURE)
SUT VIC AB 2-0 SH 27XBRD (SUTURE) IMPLANT
SUT VIC AB 3-0 FS2 27 (SUTURE) IMPLANT
SYR BULB 3OZ (MISCELLANEOUS) ×3 IMPLANT
SYR CONTROL 10ML LL (SYRINGE) ×2 IMPLANT
TOWEL OR 17X24 6PK STRL BLUE (TOWEL DISPOSABLE) ×3 IMPLANT
TOWEL OR NON WOVEN STRL DISP B (DISPOSABLE) ×1 IMPLANT
TUBE CONNECTING 20'X1/4 (TUBING) ×1
TUBE CONNECTING 20X1/4 (TUBING) ×1 IMPLANT
UNDERPAD 30X30 (UNDERPADS AND DIAPERS) ×3 IMPLANT

## 2017-05-08 NOTE — Transfer of Care (Signed)
Immediate Anesthesia Transfer of Care Note  Patient: Barbara [name]  Procedure(s) Performed: REMOVAL EXTERNAL FIXATION LEFT  LEG WITH HARDWARE REMOVAL (Left )  Patient Location: PACU  Anesthesia Type:General  Level of Consciousness: sedated  Airway & Oxygen Therapy: Patient Spontanous Breathing and Patient connected to face mask oxygen  Post-op Assessment: Report given to RN and Post -op Vital signs reviewed and stable  Post vital signs: Reviewed and stable  Last Vitals:  Vitals:   05/08/17 1027 05/08/17 1236  BP: (!) 152/79 110/64  Pulse: 79 76  Resp: 16 (P) 14  Temp: 36.7 C   SpO2: 100% 98%    Last Pain:  Vitals:   05/08/17 1027  TempSrc: Oral         Complications: No apparent anesthesia complications

## 2017-05-08 NOTE — Interval H&P Note (Signed)
History and Physical Interval Note:  05/08/2017 11:47 AM  Barbara [name]  has presented today for surgery, with the diagnosis of LEFT TIBIA FRACTURE  The various methods of treatment have been discussed with the patient and family. After consideration of risks, benefits and other options for treatment, the patient has consented to  Procedure(s): REMOVAL EXTERNAL FIXATION LEFT  LEG WITH HARDWARE REMOVAL (Left) as a surgical intervention .  The patient's history has been reviewed, patient examined, no change in status, stable for surgery.  I have reviewed the patient's chart and labs.  Questions were answered to the patient's satisfaction.     [facility], TIMOTHY D

## 2017-05-08 NOTE — Discharge Instructions (Signed)
Keep leg elevated with ice to reduce pain and swelling. You may substitute Tylenol for pain medication.*Next dose can be taken at 4:30pm.  Diet: As you were doing prior to hospitalization   Dressing:  You may remove dressings and shower over incisions 3 days after surgery.  Place clean Band-Aid or gauze over incisions.   Activity:  Increase activity slowly as tolerated, but follow the weight bearing instructions below.  The rules on driving is that you can not be taking narcotics while you drive, and you must feel in control of the vehicle.    To prevent constipation: you may use a stool softener such as -   Colace (over the counter) 100 mg by mouth twice a day  Drink plenty of fluids (prune juice may be helpful) and high fiber foods Miralax (over the counter) for constipation as needed.    Itching:  If you experience itching with your medications, try taking only a single pain pill, or even half a pain pill at a time.  You can also use benadryl over the counter for itching or also to help with sleep.   Precautions:  If you experience chest pain or shortness of breath - call 911 immediately for transfer to the hospital emergency department!!  If you develop a fever greater that 101 F, purulent drainage from wound, increased redness or drainage from wound, or calf pain -- Call the office at [phone]                                                 Follow- Up Appointment:  Please call for an appointment to be seen in 2 weeks [place] - [phone]  Post Anesthesia Home Care Instructions  Activity: Get plenty of rest for the remainder of the day. A responsible individual must stay with you for 24 hours following the procedure.  For the next 24 hours, DO NOT: -Drive a car -Paediatric nurse -Drink alcoholic beverages -Take any medication unless instructed by your physician -Make any legal decisions or sign important papers.  Meals: Start with liquid foods such as gelatin or  soup. Progress to regular foods as tolerated. Avoid greasy, spicy, heavy foods. If nausea and/or vomiting occur, drink only clear liquids until the nausea and/or vomiting subsides. Call your physician if vomiting continues.  Special Instructions/Symptoms: Your throat may feel dry or sore from the anesthesia or the breathing tube placed in your throat during surgery. If this causes discomfort, gargle with warm salt water. The discomfort should disappear within 24 hours.  If you had a scopolamine patch placed behind your ear for the management of post- operative nausea and/or vomiting:  1. The medication in the patch is effective for 72 hours, after which it should be removed.  Wrap patch in a tissue and discard in the trash. Wash hands thoroughly with soap and water. 2. You may remove the patch earlier than 72 hours if you experience unpleasant side effects which may include dry mouth, dizziness or visual disturbances. 3. Avoid touching the patch. Wash your hands with soap and water after contact with the patch.

## 2017-05-08 NOTE — Anesthesia Postprocedure Evaluation (Signed)
Anesthesia Post Note  Patient: Barbara [name]  Procedure(s) Performed: REMOVAL EXTERNAL FIXATION LEFT  LEG WITH HARDWARE REMOVAL (Left Leg Lower)     Patient location during evaluation: PACU Anesthesia Type: General Level of consciousness: awake and alert Pain management: pain level controlled Vital Signs Assessment: post-procedure vital signs reviewed and stable Respiratory status: spontaneous breathing, nonlabored ventilation and respiratory function stable Cardiovascular status: blood pressure returned to baseline and stable Postop Assessment: no apparent nausea or vomiting Anesthetic complications: no    Last Vitals:  Vitals:   05/08/17 1310 05/08/17 1330  BP:  127/84  Pulse: 73 75  Resp: 20 16  Temp:  36.5 C  SpO2: 100% 99%    Last Pain:  Vitals:   05/08/17 1330  TempSrc:   PainSc: 1                  Catalina Gravel

## 2017-05-08 NOTE — Anesthesia Preprocedure Evaluation (Addendum)
Anesthesia Evaluation  Patient identified by MRN, date of birth, ID band Patient awake    Reviewed: Allergy & Precautions, NPO status , Patient's Chart, lab work & pertinent test results, reviewed documented beta blocker date and time   History of Anesthesia Complications (+) PONV and history of anesthetic complications  Airway Mallampati: II  TM Distance: >3 FB Neck ROM: Full    Dental  (+) Teeth Intact, Dental Advisory Given   Pulmonary asthma ,    Pulmonary exam normal breath sounds clear to auscultation       Cardiovascular hypertension, Pt. on medications Normal cardiovascular exam Rhythm:Regular Rate:Normal     Neuro/Psych  Headaches, PSYCHIATRIC DISORDERS Anxiety  Neuromuscular disease (tremor)    GI/Hepatic Neg liver ROS, GERD  Medicated,  Endo/Other  Obesity   Renal/GU negative Renal ROS     Musculoskeletal  (+) Arthritis , Osteoarthritis,    Abdominal   Peds  Hematology negative hematology ROS (+)   Anesthesia Other Findings Day of surgery medications reviewed with the patient.  Reproductive/Obstetrics                            Anesthesia Physical Anesthesia Plan  ASA: II  Anesthesia Plan: General   Post-op Pain Management:    Induction: Intravenous  PONV Risk Score and Plan: 4 or greater and Treatment may vary due to age or medical condition, Dexamethasone, Ondansetron and TIVA  Airway Management Planned: LMA  Additional Equipment:   Intra-op Plan:   Post-operative Plan: Extubation in OR  Informed Consent: I have reviewed the patients History and Physical, chart, labs and discussed the procedure including the risks, benefits and alternatives for the proposed anesthesia with the patient or authorized representative who has indicated his/her understanding and acceptance.   Dental advisory given  Plan Discussed with: CRNA  Anesthesia Plan Comments: (NO  NARCOTICS)       Anesthesia Quick Evaluation

## 2017-05-08 NOTE — Op Note (Signed)
05/08/2017  12:34 PM  PATIENT:  Barbara [name]    PRE-OPERATIVE DIAGNOSIS:  LEFT TIBIA FRACTURE  POST-OPERATIVE DIAGNOSIS:  Same  PROCEDURE:  REMOVAL EXTERNAL FIXATION LEFT  LEG WITH HARDWARE REMOVAL  SURGEON:  Psalm Arman, Ernesta Amble, MD  ASSISTANT: Roxan Hockey, PA-C, he was present and scrubbed throughout the case, critical for completion in a timely fashion, and for retraction, instrumentation, and closure.   ANESTHESIA:   gen  PREOPERATIVE INDICATIONS:  Barbara [name] is a  67 y.o. female with a diagnosis of LEFT TIBIA FRACTURE who failed conservative measures and elected for surgical management.    The risks benefits and alternatives were discussed with the patient preoperatively including but not limited to the risks of infection, bleeding, nerve injury, cardiopulmonary complications, the need for revision surgery, among others, and the patient was willing to proceed.  TOURNIQUET TIME: none  OPERATIVE PROCEDURE:  Patient was identified in the preoperative holding area and site was marked by me She was transported to the operating theater and placed on the table in supine position taking care to pad all bony prominences. After a preincinduction time out anesthesia was induced. The left lower extremity was prepped and draped in normal sterile fashion and a pre-incision timeout was performed. She received ancef for preoperative antibiotics.   Inserted by removing her external fixator this came out intact.  Next I prepped and draped the left lower extremity.  I made a stab incision used fluoroscopic guidance to remove the cannulated screw and washer from her lateral plateau.  This came out whole.  I then thoroughly irrigated her incision and pin sites I placed a simple stitch in the incision.  Sterile dressings were applied she was awoken and taken the PACU in stable condition  POST OPERATIVE PLAN: Touchdown weightbearing mobilize and chemical DVT prophylaxis

## 2017-05-08 NOTE — Anesthesia Procedure Notes (Signed)
Procedure Name: LMA Insertion Date/Time: 05/08/2017 11:59 AM Performed by: Maryella Shivers, CRNA Pre-anesthesia Checklist: Patient identified, Emergency Drugs available, Suction available and Patient being monitored Patient Re-evaluated:Patient Re-evaluated prior to induction Oxygen Delivery Method: Circle system utilized Preoxygenation: Pre-oxygenation with 100% oxygen Induction Type: IV induction Ventilation: Mask ventilation without difficulty LMA: LMA inserted LMA Size: 4.0 Number of attempts: 1 Airway Equipment and Method: Bite block Placement Confirmation: positive ETCO2 Tube secured with: Tape Dental Injury: Teeth and Oropharynx as per pre-operative assessment

## 2017-05-09 ENCOUNTER — Encounter (HOSPITAL_BASED_OUTPATIENT_CLINIC_OR_DEPARTMENT_OTHER): Payer: Self-pay | Admitting: Orthopedic Surgery

## 2017-06-14 ENCOUNTER — Ambulatory Visit: Payer: Medicare Other | Admitting: Internal Medicine

## 2017-07-02 ENCOUNTER — Telehealth: Payer: Self-pay | Admitting: Internal Medicine

## 2017-07-02 NOTE — Telephone Encounter (Signed)
New message  Patient is concerned about high BP  Pt c/o BP issue: STAT if pt c/o blurred vision, one-sided weakness or slurred speech  1. What are your last 5 BP readings? 120/90, 135/90  2. Are you having any other symptoms (ex. Dizziness, headache, blurred vision, passed out)? Headache for 2 days  3. What is your BP issue? Feels BP too high No chest pain No shortness of breath

## 2017-07-02 NOTE — Telephone Encounter (Signed)
Spoke with patient who is concerned about her Bps lately (120/90 and 135/90)  She broke her leg 13 weeks ago and has had intermittent pain, now going through rehab.   I mentioned that this is not particularly alarming, but her husband is quite concerned about her diastolic reading.    I recommended that she monitor her Bps bid over the next few days, record and report the readings.  She verbalized an understanding.

## 2017-08-09 ENCOUNTER — Other Ambulatory Visit: Payer: Self-pay | Admitting: Gastroenterology

## 2017-08-10 ENCOUNTER — Encounter: Payer: Self-pay | Admitting: Internal Medicine

## 2017-08-10 ENCOUNTER — Ambulatory Visit: Payer: Medicare Other | Admitting: Internal Medicine

## 2017-08-10 VITALS — BP 130/74 | HR 77 | Ht 62.0 in | Wt 185.0 lb

## 2017-08-10 DIAGNOSIS — I10 Essential (primary) hypertension: Secondary | ICD-10-CM

## 2017-08-10 DIAGNOSIS — R0789 Other chest pain: Secondary | ICD-10-CM

## 2017-08-10 DIAGNOSIS — E782 Mixed hyperlipidemia: Secondary | ICD-10-CM

## 2017-08-10 NOTE — Patient Instructions (Signed)
Your physician recommends that you continue on your current medications as directed. Please refer to the Current Medication list given to you today. Your physician wants you to follow-up in: Jan 2020 with Dr. Harrington Challenger. You will receive a reminder letter in the mail two months in advance. If you don't receive a letter, please call our office to schedule the follow-up appointment.

## 2017-08-10 NOTE — Progress Notes (Signed)
Cardiology Office Note   Date:  08/10/2017   ID:  SANTITA HUNSBERGER, DOB 01-07-1951, MRN [id number]  PCP:  Jonathon Jordan, MD  Cardiologist:   Dorris Carnes, MD   F/U of Chest pain   History of Present Illness: Barbara [name] is a 67 y.o. female with a history of HTN, HL, GERD and CP    CXR with atherosclerosis   Cath with normal coornary arteries.  Normap pressures  I saw the pt in November 2018 At that vilsit I recommended amlodione 2.5     Since seen she has done OK   Breathing is OK   No CP    Current Meds  Medication Sig  . amLODipine (NORVASC) 2.5 MG tablet Take 1 tablet (2.5 mg total) daily by mouth.  Marland Kitchen aspirin EC 81 MG tablet Take 1 tablet (81 mg total) by mouth daily.  Marland Kitchen HYDROcodone-acetaminophen (NORCO) 5-325 MG tablet Take 1 tablet by mouth every 6 (six) hours as needed for moderate pain.  Marland Kitchen levocetirizine (XYZAL) 5 MG tablet Take 5 mg by mouth daily as needed for allergies.   Marland Kitchen omeprazole (PRILOSEC) 20 MG capsule Take 20 mg by mouth daily.  . ondansetron (ZOFRAN) 4 MG tablet Take 1 tablet (4 mg total) by mouth every 8 (eight) hours as needed for nausea or vomiting.  . propranolol (INDERAL) 10 MG tablet Take 10 mg by mouth 2 (two) times daily.   . psyllium (HYDROCIL/METAMUCIL) 95 % PACK Take 1 packet by mouth 2 (two) times daily.     Allergies:   Ace inhibitors; Amoxicillin; Azathioprine; Celebrex [celecoxib]; Enalapril; Fentanyl; Fish oil; Gabapentin; Lanolin acid [lanolin]; Levofloxacin; Methotrexate derivatives; Nickel; Other; Penicillins; Prednisone; Propoxyphene hcl; Sulfonamide derivatives; Synvisc [hylan g-f 20]; Tramadol; and Sulfa antibiotics   Past Medical History:  Diagnosis Date  . 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 Anxiety   . Arthritis    knees  . Cancer ([place])    basal cell- nose  . GERD (gastroesophageal reflux disease)   . Hyperlipidemia   . Hypertension   . Meningioma ([place])   . Neuromuscular disorder ([place])    benign tremor- takes Propanolol   . PONV (postoperative nausea and vomiting)    "BP DROPS ALSO"  . Trigeminal neuralgia of right side of face     Past Surgical History:  Procedure Laterality Date  . [place] STUDY N/A 04/17/2016   Procedure: [place] STUDY;  Surgeon: Manus Gunning, MD;  Location: [facility];  Service: Gastroenterology;  Laterality: N/A;  . ESOPHAGEAL MANOMETRY N/A 04/17/2016   Procedure: ESOPHAGEAL MANOMETRY (EM);  Surgeon: Manus Gunning, MD;  Location: [facility];  Service: Gastroenterology;  Laterality: N/A;  . EXTERNAL FIXATION LEG Left 03/28/2017   [place]  . EXTERNAL FIXATION LEG Left 03/28/2017   Procedure: EXTERNAL FIXATION LEG;  Surgeon: Renette Butters, MD;  Location: [facility];  Service: Orthopedics;  Laterality: Left;  . EXTERNAL FIXATION REMOVAL Left 05/08/2017   Procedure: REMOVAL EXTERNAL FIXATION LEFT  LEG WITH HARDWARE REMOVAL;  Surgeon: Renette Butters, MD;  Location: [facility];  Service: Orthopedics;  Laterality: Left;  . fractured femur  05/2012   left - rod -screws placed  . HARDWARE REMOVAL Left 04/16/2013   Procedure: REMOVAL OF HARDWARE OF LEFT KNEE (DISTAL INTERLOC SCREW);  Surgeon: Gearlean Alf, MD;  Location: [facility];  Service: Orthopedics;  Laterality: Left;  . KNEE SURGERY     x 3 left / x1  rt knee  . LEFT HEART CATH AND CORONARY ANGIOGRAPHY N/A 09/18/2016   Procedure: Left Heart Cath and Coronary Angiography;  Surgeon: Sherren Mocha, MD;  Location: [facility];  Service: Cardiovascular;  Laterality: N/A;  . NASAL SINUS SURGERY     2000  . OTHER SURGICAL HISTORY  2014   titanium rod in left femur used for fracture repair  . SHOULDER SURGERY     bilateral shoulders   . WISDOM TOOTH EXTRACTION       Social History:  The patient  reports that she has never smoked. She has never used smokeless tobacco. She reports that she drinks alcohol. She reports that she does not use drugs.   Family History:  The patient's family history  includes Allergies in her father; Brain cancer in her brother; Heart attack (age of onset: 86) in her father; Heart disease (age of onset: 51) in her mother; Lung cancer in her father; Stomach cancer in her mother.    ROS:  Please see the history of present illness. All other systems are reviewed and  Negative to the above problem except as noted.    PHYSICAL EXAM: VS:  BP 130/74   Pulse 77   Ht 5\' 2"  (1.575 m)   Wt 83.9 kg (185 lb)   SpO2 99%   BMI 33.84 kg/m   GEN: Obese 67 yo in no acute distress  HEENT: normal  Neck:  JVP not elevated   No carotid bruits, or masses Cardiac: RRR; no murmurs, rubs, or gallops,no edema  Respiratory:  clear to auscultation bilaterally, normal work of breathing GI: soft, nontender, nondistended, + BS  No hepatomegaly  MS: no deformity Moving all extremities   Skin: warm and dry, no rash Neuro:  Strength and sensation are intact Psych: euthymic mood, full affect   EKG:  EKG is not ordered today.   Lipid Panel    Component Value Date/Time   CHOL 184 09/17/2016 0243   TRIG 119 09/17/2016 0243   HDL 44 09/17/2016 0243   CHOLHDL 4.2 09/17/2016 0243   VLDL 24 09/17/2016 0243   LDLCALC 116 (H) 09/17/2016 0243      Wt Readings from Last 3 Encounters:  08/10/17 83.9 kg (185 lb)  05/08/17 77.1 kg (170 lb)  01/26/17 84.8 kg (187 lb)      ASSESSMENT AND PLAN:  1  Chest pressure  Denies    Cath normal  2  HTN BP is OK   Keep on same regimne   Good at home     3  HL  Currently on red yeast rice  No CAD  F/U in Jan 2020   Current medicines are reviewed at length with the patient today.  The patient does not have concerns regarding medicines.  Signed, Dorris Carnes, MD  08/10/2017 12:23 PM    [address], [place], [place]  [address] Phone: [phone]; Fax: [phone]

## 2017-11-24 ENCOUNTER — Other Ambulatory Visit: Payer: Self-pay | Admitting: Gastroenterology

## 2017-11-26 NOTE — Telephone Encounter (Signed)
Ok to refill?  When would you like to see her back in clinic?

## 2017-11-27 NOTE — Telephone Encounter (Signed)
We can refill and she should have a routine office visit follow up scheduled, it's been over a year and a half since she has been seen. Thanks

## 2017-12-10 ENCOUNTER — Other Ambulatory Visit: Payer: Self-pay | Admitting: Gastroenterology

## 2017-12-11 ENCOUNTER — Other Ambulatory Visit: Payer: Self-pay | Admitting: Gastroenterology

## 2017-12-11 ENCOUNTER — Other Ambulatory Visit: Payer: Self-pay

## 2017-12-11 NOTE — Progress Notes (Signed)
Called and spoke to patient.  She said she does not need more Omeprazole at this time.  She has plenty for now.  She is in the middle of move but will call us to schedule an appt before she needs more refills.

## 2017-12-28 ENCOUNTER — Other Ambulatory Visit: Payer: Self-pay | Admitting: Internal Medicine

## 2018-07-13 ENCOUNTER — Other Ambulatory Visit: Payer: Self-pay | Admitting: Internal Medicine

## 2018-10-11 ENCOUNTER — Telehealth: Payer: Self-pay

## 2018-10-11 NOTE — Telephone Encounter (Signed)

## 2018-10-15 ENCOUNTER — Ambulatory Visit: Payer: Medicare Other | Admitting: Physician Assistant

## 2018-10-23 ENCOUNTER — Other Ambulatory Visit: Payer: Self-pay

## 2018-10-23 ENCOUNTER — Ambulatory Visit (INDEPENDENT_AMBULATORY_CARE_PROVIDER_SITE_OTHER): Payer: Medicare Other | Admitting: Cardiology

## 2018-10-23 ENCOUNTER — Encounter: Payer: Self-pay | Admitting: Cardiology

## 2018-10-23 VITALS — BP 120/70 | HR 73 | Ht 65.0 in | Wt 168.1 lb

## 2018-10-23 DIAGNOSIS — E785 Hyperlipidemia, unspecified: Secondary | ICD-10-CM | POA: Diagnosis not present

## 2018-10-23 DIAGNOSIS — R0789 Other chest pain: Secondary | ICD-10-CM | POA: Diagnosis not present

## 2018-10-23 DIAGNOSIS — I10 Essential (primary) hypertension: Secondary | ICD-10-CM

## 2018-10-23 NOTE — Patient Instructions (Addendum)
Medication Instructions:  STOP: Amlodipine   If you need a refill on your cardiac medications before your next appointment, please call your pharmacy.   Lab work: None   If you have labs (blood work) drawn today and your tests are completely normal, you will receive your results only by: Marland Kitchen MyChart Message (if you have MyChart) OR . A paper copy in the mail If you have any lab test that is abnormal or we need to change your treatment, we will call you to review the results.  Testing/Procedures: None   Follow-Up: At [facility], you and your health needs are our priority.  As part of our continuing mission to provide you with exceptional heart care, we have created designated Provider Care Teams.  These Care Teams include your primary Cardiologist (physician) and Advanced Practice Providers (APPs -  Physician Assistants and Nurse Practitioners) who all work together to provide you with the care you need, when you need it. You will need a follow up appointment in:  12 months.  Please call our office 2 months in advance to schedule this appointment.  You may see Dorris Carnes, MD or one of the following Advanced Practice Providers on your designated Care Team: Richardson Dopp, PA-C [place], [place] . Daune Perch, NP  Any Other Special Instructions Will Be Listed Below (If Applicable). Monitor your blood pressure at home. If your blood pressure is consistently running over 135/85 we will consider restarting your amlodipine

## 2018-10-23 NOTE — Progress Notes (Signed)
Cardiology Office Note:    Date:  10/23/2018   ID:  CYNTIA STALEY, DOB [birth year], MRN [id number]  PCP:  Barbara Jordan, MD  Cardiologist:  Barbara Carnes, MD   Referring MD: Barbara Jordan, MD   Chief Complaint  Patient presents with  . Follow-up    chest pain  . Hypertension    History of Present Illness:    Barbara [name] is a 68 y.o. female with a past medical history significant for hypertension, hyperlipidemia, GERD and chest pain.  Cardiac cath in 09/2016 showed normal coronary arteries and normal pressures.  The patient was last seen in the office 08/10/2017 by Barbara [name] at which time she was doing well with no chest pain.  Home BPs 110's-120's/60's-70's   [name] is here today for follow-up. She says that she has had autoimmune problems with serious reactions to several meds. She was given high doses of prednisone and that is when she developed chest pain as well as anti rejection meds and a chemo drug. She also had a reaction to a rod in her leg placed after a break. The rod has been removed now after 2 years and she is doing much better now. She feels that her body was having a reaction to the rod.  She is now taking amlodipine in the evening due to dizziness in the day with improvement. She is also on propranolol for tremor. She wants to know if she can try stopping the low dose amlodipine as her BP seems well controlled with the propranolol.  She lives with her husband. She is unable to do a lot of activity after a left leg fracture from osteoporosis due to steroids. She rides an airdyne bike 3 miles a day.  She lost about 15 pounds with weight watchers, but is not being as diligent now since pandemic.   Past Medical History:  Diagnosis Date  . 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 Anxiety   . Arthritis    knees  . Cancer ([place])    basal cell- nose  . GERD (gastroesophageal reflux disease)   . Hyperlipidemia   . Hypertension   . Meningioma ([place])   .  Neuromuscular disorder ([place])    benign tremor- takes Propanolol  . PONV (postoperative nausea and vomiting)    "BP DROPS ALSO"  . Trigeminal neuralgia of right side of face     Past Surgical History:  Procedure Laterality Date  . [place] STUDY N/A 04/17/2016   Procedure: [place] STUDY;  Surgeon: Manus Gunning, MD;  [facility];  Service: Gastroenterology;  Laterality: N/A;  . ESOPHAGEAL MANOMETRY N/A 04/17/2016   Procedure: ESOPHAGEAL MANOMETRY (EM);  Surgeon: Manus Gunning, MD;  [facility];  Service: Gastroenterology;  Laterality: N/A;  . EXTERNAL FIXATION LEG Left 03/28/2017   [place]  . EXTERNAL FIXATION LEG Left 03/28/2017   Procedure: EXTERNAL FIXATION LEG;  Surgeon: Renette Butters, MD;  Location: [facility];  Service: Orthopedics;  Laterality: Left;  . EXTERNAL FIXATION REMOVAL Left 05/08/2017   Procedure: REMOVAL EXTERNAL FIXATION LEFT  LEG WITH HARDWARE REMOVAL;  Surgeon: Renette Butters, MD;  Location: [facility];  Service: Orthopedics;  Laterality: Left;  . fractured femur  05/2012   left - rod -screws placed  . HARDWARE REMOVAL Left 04/16/2013   Procedure: REMOVAL OF HARDWARE OF LEFT KNEE (DISTAL INTERLOC SCREW);  Surgeon: Gearlean Alf, MD;  [facility];  Service: Orthopedics;  Laterality: Left;  . KNEE SURGERY     x 3 left / x1 rt knee  . LEFT HEART CATH AND CORONARY ANGIOGRAPHY N/A 09/18/2016   Procedure: Left Heart Cath and Coronary Angiography;  Surgeon: Sherren Mocha, MD;  Location: [facility];  Service: Cardiovascular;  Laterality: N/A;  . NASAL SINUS SURGERY     2000  . OTHER SURGICAL HISTORY  2014   titanium rod in left femur used for fracture repair  . SHOULDER SURGERY     bilateral shoulders   . WISDOM TOOTH EXTRACTION      Current Medications: Current Meds  Medication Sig  . amLODipine (NORVASC) 2.5 MG tablet TAKE 1 TABLET (2.5 MG TOTAL) DAILY BY MOUTH.  Marland Kitchen b complex vitamins tablet Take 1  tablet by mouth daily.  . Cholecalciferol (VITAMIN D-3) 25 MCG (1000 UT) CAPS Take 1 capsule by mouth daily.  Marland Kitchen levocetirizine (XYZAL) 5 MG tablet Take 5 mg by mouth daily as needed for allergies.   . Magnesium 250 MG TABS Take 1 tablet by mouth daily.  . Menaquinone-7 (VITAMIN K2 PO) Take 1 tablet by mouth daily.  . Milk Thistle 250 MG CAPS Take 1 capsule by mouth 2 (two) times daily.  . Multiple Vitamins-Minerals (ICAPS PO) Take 1 capsule by mouth 2 (two) times daily.  Marland Kitchen omeprazole (PRILOSEC) 20 MG capsule Take 1 capsule (20 mg total) by mouth 2 (two) times daily before a meal. PLEASE MAKE AN APPT FOR FUTURE REFILLS: [phone]  . Probiotic Product (PROBIOTIC ADVANCED PO) Take 1 tablet by mouth daily.  . propranolol (INDERAL) 10 MG tablet Take 10 mg by mouth 2 (two) times daily.   . psyllium (HYDROCIL/METAMUCIL) 95 % PACK Take 1 packet by mouth 2 (two) times daily.  . Red Yeast Rice Extract (RED YEAST RICE PO) Take 1,200 mg by mouth 2 (two) times daily.     Allergies:   Ace inhibitors, Amoxicillin, Azathioprine, Celebrex [celecoxib], Enalapril, Fentanyl, Fish oil, Gabapentin, Lanolin acid [lanolin], Levofloxacin, Methotrexate derivatives, Nickel, Other, Penicillins, Prednisone, Propoxyphene hcl, Sulfonamide derivatives, Synvisc [hylan g-f 20], Tramadol, and Sulfa antibiotics   Social History   Socioeconomic History  . Marital status: Married    Spouse name: Not on file  . Number of children: 2  . Years of education: college  . Highest education level: Bachelor's degree (e.g., BA, AB, BS)  Occupational History  . Occupation: retired Tour manager  . Financial resource strain: Not on file  . Food insecurity    Worry: Not on file    Inability: Not on file  . Transportation needs    Medical: Not on file    Non-medical: Not on file  Tobacco Use  . Smoking status: Never Smoker  . Smokeless tobacco: Never Used  Substance and Sexual Activity  . Alcohol use: Yes    Comment:  RARE  . Drug use: No  . Sexual activity: Not on file  Lifestyle  . Physical activity    Days per week: Not on file    Minutes per session: Not on file  . Stress: Not on file  Relationships  . Social Herbalist on phone: Not on file    Gets together: Not on file    Attends religious service: Not on file    Active member of club or organization: Not on file    Attends meetings of clubs or organizations: Not on file    Relationship status: Not on file  Other  Topics Concern  . Not on file  Social History Narrative   Lives at home at home with husband.    Right-handed.   No caffeine use.     Family History: The patient's family history includes Allergies in her father; Brain cancer in her brother; Heart attack (age of onset: 23) in her father; Heart disease (age of onset: 41) in her mother; Lung cancer in her father; Stomach cancer in her mother. There is no history of Colon cancer, Esophageal cancer, or Rectal cancer. ROS:   Please see the history of present illness.     All other systems reviewed and are negative.  EKGs/Labs/Other Studies Reviewed:    The following studies were reviewed today:  Left Heart Cath and Coronary Angiography 09/18/2016  Conclusion 1. Angiographically normal coronary arteries 2. Normal LV function with normal LVEDP  Recommendations: Suspect noncardiac chest pain.    Echocardiogram 09/17/2016 Study Conclusions - Left ventricle: The cavity size was normal. Wall thickness was   normal. Systolic function was normal. The estimated ejection   fraction was in the range of 60% to 65%. Wall motion was normal;   there were no regional wall motion abnormalities. Diastolic   dysfunction, grade indeterminate. Normal filling pressures.  EKG:  EKG is ordered today.  The ekg ordered today demonstrates NSR, normal.  Recent Labs: No results found for requested labs within last 8760 hours.   Recent Lipid Panel    Component Value Date/Time   CHOL 184  09/17/2016 0243   TRIG 119 09/17/2016 0243   HDL 44 09/17/2016 0243   CHOLHDL 4.2 09/17/2016 0243   VLDL 24 09/17/2016 0243   LDLCALC 116 (H) 09/17/2016 0243    Physical Exam:    VS:  BP 120/70   Pulse 73   Ht 5\' 5"  (1.651 m)   Wt 168 lb 1.9 oz (76.3 kg)   SpO2 97%   BMI 27.98 kg/m     Wt Readings from Last 3 Encounters:  10/23/18 168 lb 1.9 oz (76.3 kg)  08/10/17 185 lb (83.9 kg)  05/08/17 170 lb (77.1 kg)     Physical Exam  Constitutional: She is oriented to person, place, and time. She appears well-developed and well-nourished. No distress.  HENT:  Head: Normocephalic and atraumatic.  Neck: Normal range of motion. Neck supple. No JVD present.  Cardiovascular: Normal rate, regular rhythm, normal heart sounds and intact distal pulses. Exam reveals no gallop and no friction rub.  No murmur heard. Pulmonary/Chest: Effort normal and breath sounds normal. No respiratory distress. She has no wheezes. She has no rales.  Abdominal: Soft. Bowel sounds are normal.  Musculoskeletal: Normal range of motion.        General: No edema.     Comments: Slightly reduced ROM of left knee, slightly crooked  Neurological: She is alert and oriented to person, place, and time.  Skin: Skin is warm and dry.  Psychiatric: She has a normal mood and affect. Her behavior is normal. Judgment and thought content normal.  Vitals reviewed.    ASSESSMENT:    1. Other chest pain   2. Essential (primary) hypertension   3. Hyperlipidemia, unspecified hyperlipidemia type    PLAN:    In order of problems listed above:  Prior chest pain -Related to an immune disorder and steroid therapy. -Normal cath in 09/2016 -Autoimmune disorder now better, and she has had no further chest pain.  Hypertension -On amlodipine 2.5 mg -Takes Propranolol 10 mg twice daily for treatent of  tremors -Pt wants to stop amlodipine as BP's have been very good and she was having some dizziness. I think this is fine. She  will monitor her BP and can restart if consistently over 135/85.   Hyperlipidemia -On red yeast rice, no history of CAD -Followed by PCP   Medication Adjustments/Labs and Tests Ordered: Current medicines are reviewed at length with the patient today.  Concerns regarding medicines are outlined above. Labs and tests ordered and medication changes are outlined in the patient instructions below:  There are no Patient Instructions on file for this visit.   Signed, Daune Perch, NP  10/23/2018 3:44 PM    [facility]

## 2019-04-17 ENCOUNTER — Ambulatory Visit: Payer: Medicare PPO | Attending: Internal Medicine

## 2019-04-17 DIAGNOSIS — Z23 Encounter for immunization: Secondary | ICD-10-CM

## 2019-04-17 NOTE — Progress Notes (Signed)
   Covid-19 Vaccination Clinic  Name:  Barbara [name]    MRN: [id number] DOB: [birth year]  04/17/2019  Barbara [name] was observed post Covid-19 immunization for 15 minutes without incidence. She was provided with Vaccine Information Sheet and instruction to access the V-Safe system.   Barbara [name] was instructed to call 911 with any severe reactions post vaccine: Marland Kitchen Difficulty breathing  . Swelling of your face and throat  . A fast heartbeat  . A bad rash all over your body  . Dizziness and weakness

## 2019-05-13 ENCOUNTER — Ambulatory Visit: Payer: Medicare PPO | Attending: Internal Medicine

## 2019-05-13 DIAGNOSIS — Z23 Encounter for immunization: Secondary | ICD-10-CM

## 2019-05-13 NOTE — Progress Notes (Signed)
   Covid-19 Vaccination Clinic  Name:  Barbara [name]    MRN: [id number] DOB: 02/24/1951  05/13/2019  Barbara [name] was observed post Covid-19 immunization for 15 minutes without incident. She was provided with Vaccine Information Sheet and instruction to access the V-Safe system.   Barbara [name] was instructed to call 911 with any severe reactions post vaccine: Marland Kitchen Difficulty breathing  . Swelling of face and throat  . A fast heartbeat  . A bad rash all over body  . Dizziness and weakness   Immunizations Administered    Name Date Dose VIS Date Route   Pfizer COVID-19 Vaccine 05/13/2019 10:07 AM 0.3 mL 02/21/2019 Intramuscular   Manufacturer: [place]   Lot: [id number]   [employer]: [id number]

## 2019-10-17 DIAGNOSIS — Z20822 Contact with and (suspected) exposure to covid-19: Secondary | ICD-10-CM | POA: Diagnosis not present

## 2019-11-27 DIAGNOSIS — Z23 Encounter for immunization: Secondary | ICD-10-CM | POA: Diagnosis not present

## 2019-11-29 NOTE — Progress Notes (Signed)
Cardiology Office Note   Date:  12/01/2019   ID:  Barbara [name], DOB 1950/11/06, MRN [id number]  PCP:  Jonathon Jordan, MD  Cardiologist:   Dorris Carnes, MD   Pt presents for f/u of HTN and CP       History of Present Illness: Barbara [name] is a 69 y.o. female with a history of HTN, HL, GERD,and chest pain   Cardiac cath in July 2018 showed normal coronary arteries   I saw her in clinic in May 2019    She was seen yb Bu[place] Kos  since  Pt says for the most part she feels good   Every now and then will feel a flutter   Like not beating regularly   Lasts a few seonds   Usually sitting   Patient was riding [employer]  And walks  Does have knee problems      Husband tells her she snores a lot  Thinks she has sleep apnea     Current Meds  Medication Sig  . b complex vitamins tablet Take 1 tablet by mouth daily.  . Cholecalciferol (VITAMIN D-3) 25 MCG (1000 UT) CAPS Take 1 capsule by mouth daily.  Marland Kitchen guaiFENesin (MUCINEX) 600 MG 12 hr tablet Take by mouth as needed.  Marland Kitchen levocetirizine (XYZAL) 5 MG tablet Take 5 mg by mouth daily as needed for allergies.   Marland Kitchen loratadine (CLARITIN) 10 MG tablet Take 10 mg by mouth daily as needed for allergies.  . Menaquinone-7 (VITAMIN K2 PO) Take 1 tablet by mouth daily.  . Milk Thistle 250 MG CAPS Take 1 capsule by mouth 2 (two) times daily.  . Multiple Vitamins-Minerals (ICAPS PO) Take 1 capsule by mouth 2 (two) times daily.  Marland Kitchen omeprazole (PRILOSEC) 20 MG capsule Take 20 mg by mouth daily.  . propranolol (INDERAL) 10 MG tablet Take 10 mg by mouth 2 (two) times daily.   . psyllium (HYDROCIL/METAMUCIL) 95 % PACK Take 1 packet by mouth 2 (two) times daily.  . Red Yeast Rice Extract (RED YEAST RICE PO) Take 1,200 mg by mouth 2 (two) times daily.  . Vitamin D, Ergocalciferol, (DRISDOL) 1.25 MG (50000 UNIT) CAPS capsule Take 50,000 Units by mouth every 7 (seven) days.     Allergies:   Ace inhibitors, Amoxicillin, Azathioprine, Celebrex [celecoxib],  Enalapril, Fentanyl, Fish oil, Forteo [parathyroid hormone (recomb)], Gabapentin, Lanolin acid [lanolin], Levofloxacin, Methotrexate derivatives, Nickel, Other, Penicillins, Prednisone, Propoxyphene hcl, Sulfonamide derivatives, Synvisc [hylan g-f 20], Tramadol, and Sulfa antibiotics   Past Medical History:  Diagnosis Date  . 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 Anxiety   . Arthritis    knees  . Cancer ([place])    basal cell- nose  . GERD (gastroesophageal reflux disease)   . Hyperlipidemia   . Hypertension   . Meningioma ([place])   . Neuromuscular disorder ([place])    benign tremor- takes Propanolol  . PONV (postoperative nausea and vomiting)    "BP DROPS ALSO"  . Trigeminal neuralgia of right side of face     Past Surgical History:  Procedure Laterality Date  . [place] STUDY N/A 04/17/2016   Procedure: [place] STUDY;  Surgeon: Manus Gunning, MD;  [facility];  Service: Gastroenterology;  Laterality: N/A;  . ESOPHAGEAL MANOMETRY N/A 04/17/2016   Procedure: ESOPHAGEAL MANOMETRY (EM);  Surgeon: Manus Gunning, MD;  [facility];  Service: Gastroenterology;  Laterality: N/A;  . EXTERNAL FIXATION LEG Left 03/28/2017  [place]  . EXTERNAL FIXATION LEG Left 03/28/2017   Procedure: EXTERNAL FIXATION LEG;  Surgeon: Renette Butters, MD;  Location: [facility];  Service: Orthopedics;  Laterality: Left;  . EXTERNAL FIXATION REMOVAL Left 05/08/2017   Procedure: REMOVAL EXTERNAL FIXATION LEFT  LEG WITH HARDWARE REMOVAL;  Surgeon: Renette Butters, MD;  Location: [facility];  Service: Orthopedics;  Laterality: Left;  . fractured femur  05/2012   left - rod -screws placed  . HARDWARE REMOVAL Left 04/16/2013   Procedure: REMOVAL OF HARDWARE OF LEFT KNEE (DISTAL INTERLOC SCREW);  Surgeon: Gearlean Alf, MD;  [facility];  Service: Orthopedics;  Laterality: Left;  . KNEE SURGERY     x 3 left / x1 rt knee  . LEFT HEART CATH AND CORONARY ANGIOGRAPHY  N/A 09/18/2016   Procedure: Left Heart Cath and Coronary Angiography;  Surgeon: Sherren Mocha, MD;  Location: [facility];  Service: Cardiovascular;  Laterality: N/A;  . NASAL SINUS SURGERY     2000  . OTHER SURGICAL HISTORY  2014   titanium rod in left femur used for fracture repair  . SHOULDER SURGERY     bilateral shoulders   . WISDOM TOOTH EXTRACTION       Social History:  The patient  reports that she has never smoked. She has never used smokeless tobacco. She reports current alcohol use. She reports that she does not use drugs.   Family History:  The patient's family history includes Allergies in her father; Brain cancer in her brother; Heart attack (age of onset: 66) in her father; Heart disease (age of onset: 47) in her mother; Lung cancer in her father; Stomach cancer in her mother.    ROS:  Please see the history of present illness. All other systems are reviewed and  Negative to the above problem except as noted.    PHYSICAL EXAM: VS:  BP 128/80   Pulse 73   Ht 5\' 2"  (1.575 m)   Wt 186 lb 9.6 oz (84.6 kg)   SpO2 96%   BMI 34.13 kg/m   GEN: Obese 69 yo , in no acute distress  HEENT: normal  Neck: no JVD,  Cardiac: RRR; No murmurs  ,no LE  edema  Respiratory:  clear to auscultation bilaterally, GI: soft, nontender, nondistended, + BS  No hepatomegaly  MS: no deformity Moving all extremities   Skin: warm and dry, no rash Neuro:  Strength and sensation are intact Psych: euthymic mood, full affect   EKG:  EKG is ordered today.SR 68 bpm     Lipid Panel    Component Value Date/Time   CHOL 184 09/17/2016 0243   TRIG 119 09/17/2016 0243   HDL 44 09/17/2016 0243   CHOLHDL 4.2 09/17/2016 0243   VLDL 24 09/17/2016 0243   LDLCALC 116 (H) 09/17/2016 0243      Wt Readings from Last 3 Encounters:  12/01/19 186 lb 9.6 oz (84.6 kg)  10/23/18 168 lb 1.9 oz (76.3 kg)  08/10/17 185 lb (83.9 kg)      ASSESSMENT AND PLAN:  1  HTN  BP is contrllled   2   Palpitations   Sound like isolated skips  No hemodynamic effect  Follow  Watch / limt stimulants  3  Lipids  Will get from Dr Stephanie Acre office  4  ? Sleep apnea   Will set up for sleep study    F/U in 1 year     Current medicines are  reviewed at length with the patient today.  The patient does not have concerns regarding medicines.  Signed, Dorris Carnes, MD  12/01/2019 11:36 AM    [address], [place], [place]  [address] Phone: [phone]; Fax: [phone]

## 2019-12-01 ENCOUNTER — Encounter: Payer: Self-pay | Admitting: Internal Medicine

## 2019-12-01 ENCOUNTER — Other Ambulatory Visit: Payer: Self-pay

## 2019-12-01 ENCOUNTER — Ambulatory Visit: Payer: Medicare PPO | Admitting: Internal Medicine

## 2019-12-01 ENCOUNTER — Telehealth: Payer: Self-pay | Admitting: *Deleted

## 2019-12-01 VITALS — BP 128/80 | HR 73 | Ht 62.0 in | Wt 186.6 lb

## 2019-12-01 DIAGNOSIS — I10 Essential (primary) hypertension: Secondary | ICD-10-CM | POA: Diagnosis not present

## 2019-12-01 DIAGNOSIS — R0683 Snoring: Secondary | ICD-10-CM

## 2019-12-01 NOTE — Patient Instructions (Addendum)
Medication Instructions:  No changes *If you need a refill on your cardiac medications before your next appointment, please call your pharmacy*   Lab Work: none If you have labs (blood work) drawn today and your tests are completely normal, you will receive your results only by: Marland Kitchen MyChart Message (if you have MyChart) OR . A paper copy in the mail If you have any lab test that is abnormal or we need to change your treatment, we will call you to review the results.   Testing/Procedures: Your physician has recommended that you have a sleep study. This test records several body functions during sleep, including: brain activity, eye movement, oxygen and carbon dioxide blood levels, heart rate and rhythm, breathing rate and rhythm, the flow of air through your mouth and nose, snoring, body muscle movements, and chest and belly movement.   Follow-Up: At [facility], you and your health needs are our priority.  As part of our continuing mission to provide you with exceptional heart care, we have created designated Provider Care Teams.  These Care Teams include your primary Cardiologist (physician) and Advanced Practice Providers (APPs -  Physician Assistants and Nurse Practitioners) who all work together to provide you with the care you need, when you need it.  Your next appointment:   12 month(s)  The format for your next appointment:   In Person  Provider:   You may see Dorris Carnes, MD or one of the following Advanced Practice Providers on your designated Care Team:    Richardson Dopp, PA-C  Robbie Lis, [place]    Other Instructions  Addendum: 12/23/19 8:59am -requested most recent labs - they are from 05/2018, scheduled again with PCP for labs in 04/2020.

## 2019-12-01 NOTE — Telephone Encounter (Signed)
-----   Message from Rodman Key, RN sent at 12/01/2019 12:18 PM EDT ----- Regarding: home sleep study Hey, Dr. Harrington Challenger would like for this patient to have a home sleep study.  Thank you. Michalene

## 2019-12-04 ENCOUNTER — Telehealth: Payer: Self-pay | Admitting: *Deleted

## 2019-12-04 NOTE — Telephone Encounter (Signed)
Staff message sent to Nina ok to schedule HST. [employer] does not require a PA. 

## 2019-12-05 NOTE — Telephone Encounter (Signed)
Patient is aware and agreeable to Home Sleep Study through [facility]. Patient is scheduled for 12/26/19 at 1 pm to pick up home sleep kit and meet with Respiratory therapist at [facility]. Patient is aware that if this appointment date and time does not work for them they should contact Artis Delay directly at [phone]. Patient is aware that a sleep packet will be sent from [facility] in week. Patient is agreeable to treatment and thankful for call

## 2019-12-15 DIAGNOSIS — Z961 Presence of intraocular lens: Secondary | ICD-10-CM | POA: Diagnosis not present

## 2019-12-19 DIAGNOSIS — Z1231 Encounter for screening mammogram for malignant neoplasm of breast: Secondary | ICD-10-CM | POA: Diagnosis not present

## 2019-12-26 ENCOUNTER — Encounter (HOSPITAL_BASED_OUTPATIENT_CLINIC_OR_DEPARTMENT_OTHER): Payer: Medicare PPO | Admitting: Cardiology

## 2019-12-29 ENCOUNTER — Ambulatory Visit (HOSPITAL_BASED_OUTPATIENT_CLINIC_OR_DEPARTMENT_OTHER): Payer: Medicare PPO | Attending: Internal Medicine | Admitting: Cardiology

## 2019-12-29 ENCOUNTER — Other Ambulatory Visit: Payer: Self-pay

## 2019-12-29 DIAGNOSIS — I10 Essential (primary) hypertension: Secondary | ICD-10-CM | POA: Insufficient documentation

## 2019-12-29 DIAGNOSIS — R0902 Hypoxemia: Secondary | ICD-10-CM | POA: Diagnosis not present

## 2019-12-29 DIAGNOSIS — G4733 Obstructive sleep apnea (adult) (pediatric): Secondary | ICD-10-CM | POA: Insufficient documentation

## 2019-12-29 DIAGNOSIS — R0683 Snoring: Secondary | ICD-10-CM | POA: Diagnosis not present

## 2019-12-30 NOTE — Procedures (Signed)
   Patient Name: Barbara [name], Barbara [name] Date: 12/29/2019 Gender: Female D.O.B: 08-27-1950 Age (years): 50 Referring Provider: Dorris Carnes Height (inches): 16 Interpreting Physician: Fransico Him MD, ABSM Weight (lbs): 186 RPSGT: Jacolyn Reedy BMI: 34 MRN: [id number] Neck Size: 15.00  CLINICAL INFORMATION Sleep Study Type: HST  Indication for sleep study: Hypertension, Snoring  Epworth Sleepiness Score: 7  SLEEP STUDY TECHNIQUE A multi-channel overnight portable sleep study was performed. The channels recorded were: nasal airflow, thoracic respiratory movement, and oxygen saturation with a pulse oximetry. Snoring was also monitored.  MEDICATIONS Patient self administered medications include: N/A.  SLEEP ARCHITECTURE Patient was studied for 538.8 minutes. The sleep efficiency was 100.0 % and the patient was supine for 0%. The arousal index was 0.0 per hour.  RESPIRATORY PARAMETERS The overall AHI was 23.9 per hour, with a central apnea index of 0.0 per hour.  The oxygen nadir was 79% during sleep.  CARDIAC DATA Mean heart rate during sleep was 64.9 bpm.  IMPRESSIONS - Moderate obstructive sleep apnea occurred during this study (AHI = 23.9/h). - No significant central sleep apnea occurred during this study (CAI = 0.0/h). - Severe oxygen desaturation was noted during this study (Min O2 = 79%). - Patient snored 4.8% during the sleep.  DIAGNOSIS - Obstructive Sleep Apnea (G47.33) - Nocturnal Hypoxemia (G47.36)  RECOMMENDATIONS - Recommend ResMed auto CPAP from 4 to 20cm H2O with heated humidity and mask of choice. - Avoid alcohol, sedatives and other CNS depressants that may worsen sleep apnea and disrupt normal sleep architecture. - Sleep hygiene should be reviewed to assess factors that may improve sleep quality. - Weight management and regular exercise should be initiated or continued. - Return to Sleep Center in 8 weeks.  [Electronically signed] 12/30/2019 12:24  PM  Fransico Him MD, ABSM Diplomate, American Board of Sleep Medicine

## 2019-12-31 ENCOUNTER — Telehealth: Payer: Self-pay

## 2019-12-31 DIAGNOSIS — G4734 Idiopathic sleep related nonobstructive alveolar hypoventilation: Secondary | ICD-10-CM

## 2019-12-31 NOTE — Telephone Encounter (Signed)
-----   Message from Sueanne Margarita, MD sent at 12/30/2019 12:29 PM EDT ----- Please let patient know that they have sleep apnea and recommend auto CPAP titration through DME  Orders have been placed in Epic. Please set 8 week OV with me. Please get an overnight pulse ox on CPAP

## 2019-12-31 NOTE — Telephone Encounter (Signed)
Informed patient of sleep study results and patient understanding was verbalized. Patient understands her sleep study showed  - Moderate obstructive sleep apnea occurred during this study (AHI = 23.9/h). - No significant central sleep apnea occurred during this study (CAI = 0.0/h). - Severe oxygen desaturation was noted during this study (Min O2 = 79%). - Patient snored 4.8% during the sleep.  Upon patient request DME selection is [facility]. Patient understands she will be contacted by [facility] to set up her cpap. Patient understands to call if [facility] does not contact her with new setup in a timely manner. Patient understands they will be called once confirmation has been received from [facility] that they have received their new machine to schedule 8 week follow up appointment.   [facility] notified of new cpap order  Please add to airview Patient was grateful for the call and thanked me.

## 2020-01-06 NOTE — Telephone Encounter (Signed)
Patient has declined to have a cpap and would a referral to dr Oneal Grout for a oral device.

## 2020-01-06 NOTE — Telephone Encounter (Signed)
She is not a good candidate for oral device due to severity of her OSA and nocturnal hypoxemia.  Maybe Dr. Harrington Challenger can talk to her and change her mind.  Still recommend CPAP titration

## 2020-01-12 DIAGNOSIS — H9312 Tinnitus, left ear: Secondary | ICD-10-CM | POA: Diagnosis not present

## 2020-01-12 DIAGNOSIS — L84 Corns and callosities: Secondary | ICD-10-CM | POA: Diagnosis not present

## 2020-01-29 NOTE — Telephone Encounter (Signed)
Spoke with pt    She did not sleep well during study   Alarm kept going off   IT was on her chest  [facility] says she could not sleep with anything on her face Will go see Cassell Clement

## 2020-02-13 ENCOUNTER — Telehealth: Payer: Self-pay | Admitting: Internal Medicine

## 2020-02-13 NOTE — Telephone Encounter (Signed)
Barbara [name] from Dr. Ron Parker office states she faxed a request for information on the patient on 02/03/2020, but has not received anything back. She states the patient called their office and told them she was referred to them by Dr. Harrington Challenger.

## 2020-02-13 NOTE — Telephone Encounter (Signed)
Signed and completed referral form faxed at this time to Dr. Ron Parker office with all other requested records.

## 2020-02-26 ENCOUNTER — Telehealth: Payer: Self-pay | Admitting: Internal Medicine

## 2020-02-26 NOTE — Telephone Encounter (Signed)
Will route to sleep assistant to sent the results of sleepstudy to Dr. Kae Heller office.

## 2020-02-26 NOTE — Telephone Encounter (Signed)
The Referral Coordinator from Dr. Laverle Hobby office called about the referral Dr. Harrington Challenger sent over for this patient.  Dr. Ron Parker did not get the results from the patient's sleep study and will need those before the patient is seen.  Please fax sleep study results to [phone]

## 2020-02-27 NOTE — Telephone Encounter (Signed)
Sleep study has been faxed to the office of Dr Ron Parker at [phone].

## 2020-03-23 ENCOUNTER — Telehealth: Payer: Self-pay | Admitting: Internal Medicine

## 2020-03-23 DIAGNOSIS — I10 Essential (primary) hypertension: Secondary | ICD-10-CM

## 2020-03-23 NOTE — Telephone Encounter (Signed)
Pt c/o BP issue: STAT if pt c/o blurred vision, one-sided weakness or slurred speech  1. What are your last 5 BP readings? Yesterday 151/89, lowest this morning 135/86, ranges 140-150's   2. Are you having any other symptoms (ex. Dizziness, headache, blurred vision, passed out)? Dizziness and headaches  3. What is your BP issue? Patient states she has been having dizziness and headaches. She states she has had issues with vertigo and also has bone spurs in her neck, but is not sure if the headache and dizziness is from her BP. She states she takes propanolol 10 mg twice a day and was taken off amlodipine, because her BP was too low.

## 2020-03-23 NOTE — Telephone Encounter (Signed)
Last night BP elevated, "head splitting" and dizzy 151/87  This am it was 141/89 -135/86.  Currently visiting daughter in [place], using a wrist monitor.  Has been having same symptoms, more headaches over the last week.   Does get headaches and dizziness independent of higher BPs.     Has family members whose BP was always high.  Takes propranolol for tremors. Was on amlodipine but this was stopped.   Referral to HTN clinic placed.  Appointment scheduled for 1/20.  Kept f/u with Dr. Harrington Challenger as well.

## 2020-03-23 NOTE — Telephone Encounter (Signed)
THe pt was on amlodipine in past   2018/2019   Looks like she had painful swelling in legs I would recomm trying maxzide 37.5/12.5   Take 1/2 per day Follow BP Labs BMET when returns

## 2020-03-24 MED ORDER — TRIAMTERENE-HCTZ 37.5-25 MG PO TABS: 0.5000 | ORAL_TABLET | Freq: Every day | ORAL | 3 refills | Status: DC

## 2020-03-24 NOTE — Telephone Encounter (Signed)
Should not be any interaction or cross reactivity

## 2020-03-24 NOTE — Telephone Encounter (Signed)
Will route to PharmD to confirm okay for Maxzide as alert is flagging for allergy to Celebrex.

## 2020-04-01 ENCOUNTER — Other Ambulatory Visit: Payer: Medicare PPO | Admitting: *Deleted

## 2020-04-01 ENCOUNTER — Other Ambulatory Visit: Payer: Self-pay

## 2020-04-01 ENCOUNTER — Ambulatory Visit (INDEPENDENT_AMBULATORY_CARE_PROVIDER_SITE_OTHER): Payer: Medicare PPO | Admitting: Pharmacist

## 2020-04-01 VITALS — BP 138/86 | HR 79

## 2020-04-01 DIAGNOSIS — I10 Essential (primary) hypertension: Secondary | ICD-10-CM

## 2020-04-01 LAB — BASIC METABOLIC PANEL
BUN/Creatinine Ratio: 16 (ref 12–28)
BUN: 14 mg/dL (ref 8–27)
CO2: 24 mmol/L (ref 20–29)
Calcium: 9.5 mg/dL (ref 8.7–10.3)
Chloride: 99 mmol/L (ref 96–106)
Creatinine, Ser: 0.9 mg/dL (ref 0.57–1.00)
GFR calc Af Amer: 75 mL/min/{1.73_m2} (ref >59)
GFR calc non Af Amer: 65 mL/min/{1.73_m2} (ref >59)
Glucose: 103 mg/dL — ABNORMAL HIGH (ref 65–99)
Potassium: 4.1 mmol/L (ref 3.5–5.2)
Sodium: 140 mmol/L (ref 134–144)

## 2020-04-01 NOTE — Progress Notes (Signed)
Patient ID: Barbara [name]                 DOB: 03/10/1951                      MRN: [id number]     HPI: Barbara [name] is a 70 y.o. female referred by Dr. Harrington Challenger to HTN clinic. PMH is significant for HTN, HLD.  Seen by Dr Harrington Challenger on 12/01/19 and referred for sleep study which revelaed moderate obstructive sleep apnea.  Blood pressure had been well controlled until recently when amlodipine was discontinued.  Called clinic and reported BP of 151/87 with dizziness while she was in [place] seeing new grandchild.  Maxzide 1/2 tablet was started.  Blood pressure has since begun decreasing.  Patient presents today in good spirits and is please with BP results.  All at home BP readings at or near goal in 120s/70s-80s.  Reports she does feel dizzy sometimes in the middle of the day.  Read package insert on Maxzide and has ben taking her Maxide in the morning and then waiting 4 hours before taking any other medications.  Takes propranolol 10mg  BID for tremors which she believes are getting worse.  This is managed by PCP.  Has intolerances to many medications which she believes stems from an autoimmune disorder that began when a metal rod was inserted into leg.  She later found out she was allergic to nickel and the rod was removed.  Since starting Maxzide, she has noticed she is sometimes itchy in skin folds (under arms, inner thighs, under abdomen) and is concerned she may develop an allergy to Maxzide.  Is due for BMP today.   Current HTN meds: Maxzide 37.5-25 (1/2 tablet) daily, propranolol 10 mg BID Previously tried: amlodipine 10 BP goal: <130/80  Family History: mother had HTN, CHF; grandfather, HTN; Aunts; HTN  Diet: fruits/vegetables, proteins.  Does not fry food.  Does not like salty foods   Wt Readings from Last 3 Encounters:  12/01/19 186 lb 9.6 oz (84.6 kg)  10/23/18 168 lb 1.9 oz (76.3 kg)  08/10/17 185 lb (83.9 kg)   BP Readings from Last 3 Encounters:  12/01/19 128/80  10/23/18 120/70   08/10/17 130/74   Pulse Readings from Last 3 Encounters:  12/01/19 73  10/23/18 73  08/10/17 77    Renal function: CrCl cannot be calculated (Patient's most recent lab result is older than the maximum 21 days allowed.).  Past Medical History:  Diagnosis Date  . 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 Anxiety   . Arthritis    knees  . Cancer ([place])    basal cell- nose  . GERD (gastroesophageal reflux disease)   . Hyperlipidemia   . Hypertension   . Meningioma ([place])   . Neuromuscular disorder ([place])    benign tremor- takes Propanolol  . PONV (postoperative nausea and vomiting)    "BP DROPS ALSO"  . Trigeminal neuralgia of right side of face     Current Outpatient Medications on File Prior to Visit  Medication Sig Dispense Refill  . b complex vitamins tablet Take 1 tablet by mouth daily.    . Cholecalciferol (VITAMIN D-3) 25 MCG (1000 UT) CAPS Take 1 capsule by mouth daily.    Marland Kitchen guaiFENesin (MUCINEX) 600 MG 12 hr tablet Take by mouth as needed.    Marland Kitchen levocetirizine (XYZAL) 5 MG tablet Take 5 mg by mouth daily as needed for allergies.     Marland Kitchen  loratadine (CLARITIN) 10 MG tablet Take 10 mg by mouth daily as needed for allergies.    . Menaquinone-7 (VITAMIN K2 PO) Take 1 tablet by mouth daily.    . Milk Thistle 250 MG CAPS Take 1 capsule by mouth 2 (two) times daily.    . Multiple Vitamins-Minerals (ICAPS PO) Take 1 capsule by mouth 2 (two) times daily.    Marland Kitchen omeprazole (PRILOSEC) 20 MG capsule Take 20 mg by mouth daily.    . propranolol (INDERAL) 10 MG tablet Take 10 mg by mouth 2 (two) times daily.     . psyllium (HYDROCIL/METAMUCIL) 95 % PACK Take 1 packet by mouth 2 (two) times daily.    . Red Yeast Rice Extract (RED YEAST RICE PO) Take 1,200 mg by mouth 2 (two) times daily.    Marland Kitchen triamterene-hydrochlorothiazide (MAXZIDE-25) 37.5-25 MG tablet Take 0.5 tablets by mouth daily. 45 tablet 3  . Vitamin D, Ergocalciferol, (DRISDOL) 1.25 MG (50000 UNIT) CAPS capsule Take 50,000 Units by  mouth every 7 (seven) days.     No current facility-administered medications on file prior to visit.    Allergies  Allergen Reactions  . Ace Inhibitors     Pt unsure- nausea or hives  . Amoxicillin Hives  . Azathioprine Other (See Comments)    "cardiac issues"  . Celebrex [Celecoxib] Other (See Comments)    Severe stomach cramps  . Enalapril Other (See Comments)    congestion  . Fentanyl Nausea And Vomiting  . Fish Oil Other (See Comments)    Broke out with blisters  . Forteo [Parathyroid Hormone (Recomb)]   . Gabapentin     Pain and swelling   . Lanolin Acid [Lanolin]     rash  . Levofloxacin Other (See Comments)    "sore ankles"  . Methotrexate Derivatives Other (See Comments)    "cardiac issues"  . Nickel     rash  . Other Nausea And Vomiting    Narcotic Pain Killers- vomitting, nausea constantly  . Penicillins Hives    Has patient had a PCN reaction causing immediate rash, facial/tongue/throat swelling, SOB or lightheadedness with hypotension: unknown Has patient had a PCN reaction causing severe rash involving mucus membranes or skin necrosis: no Has patient had a PCN reaction that required hospitalization - no, was at MD office Has patient had a PCN reaction occurring within the last 10 years: no If all of the above answers are "NO", then may proceed with Cephalosporin use.   . Prednisone Other (See Comments)    High blood pressure.  . Propoxyphene Hcl Nausea Only  . Sulfonamide Derivatives Nausea Only and Other (See Comments)    Stomach cramping  . Synvisc [Hylan G-F 20]     Made knee swell up at injection site  . Tramadol   . Sulfa Antibiotics Nausea Only, Rash and Nausea And Vomiting     Assessment/Plan:  1. Hypertension - BP in room today 138/86 which is above goal of <130/80 however may be related to white coat HTN since the majority of home read.ings are at goal.   Concern regarding pruritis since starting Maxzide. Advised patient to continue to  monitor and if it persists, or spreads, may need to discontinue therapy.  Advised patient she does not need to wait four hours between taking her blood pressure medications.  Continue avoiding salty foods  BMP results are currently pending and has follow up with Dr Harrington Challenger in 2 weeks.  Will continue current regimen pending results.  Continue  Maxzide 25 once daily Continue propranolol 10mg  BID Check BMP Recheck as needed  Karren Cobble, PharmD, BCACP, CDCES, [place] [id number], [place], [place] [address] Phone: [phone]; Fax: [phone] 04/02/2020 8:30 AM

## 2020-04-01 NOTE — Patient Instructions (Addendum)
It was nice meeting you today!  We would like to keep your blood pressure less than 130/80  Continue your propranolol 10mg  twice a day  Continue your triamterene/HCTZ 1/2 tablet once a day in the morning  We will check your lab results and I will speak with you next week about the medication schedule   Please call with any questions  Karren Cobble, PharmD, BCACP, CDCES, [place] [address], [place], [place] [address] Phone: [phone]; Fax: [phone] 04/01/2020 11:05 AM

## 2020-04-02 ENCOUNTER — Encounter: Payer: Self-pay | Admitting: Pharmacist

## 2020-04-07 DIAGNOSIS — M170 Bilateral primary osteoarthritis of knee: Secondary | ICD-10-CM | POA: Diagnosis not present

## 2020-04-10 DIAGNOSIS — Z20822 Contact with and (suspected) exposure to covid-19: Secondary | ICD-10-CM | POA: Diagnosis not present

## 2020-04-14 DIAGNOSIS — M170 Bilateral primary osteoarthritis of knee: Secondary | ICD-10-CM | POA: Diagnosis not present

## 2020-04-19 NOTE — Progress Notes (Signed)
Cardiology Office Note   Date:  04/20/2020   ID:  Barbara [name], DOB 12-26-1950, MRN [id number]  PCP:  Barbara Jordan, MD  Cardiologist:   Barbara Carnes, MD   Pt presents for f/u of HTN and CP       History of Present Illness: Barbara [name] is a 70 y.o. female with a history of HTN, HL, GERD,and chest pain   Cardiac cath in July 2018 showed normal coronary arteries   I saw the pt in Sept 2021   She was seen by Barbara [name] after    The pt is now on Maszide and says she is tolerating   Had a rash initially     Says her breathing is OK   No CP   NO edema  No dizziness    Current Meds  Medication Sig   b complex vitamins tablet Take 1 tablet by mouth daily.   Cholecalciferol (VITAMIN D-3) 25 MCG (1000 UT) CAPS Take 1 capsule by mouth daily.   guaiFENesin (MUCINEX) 600 MG 12 hr tablet Take by mouth as needed.   levocetirizine (XYZAL) 5 MG tablet Take 5 mg by mouth daily as needed for allergies.    loratadine (CLARITIN) 10 MG tablet Take 10 mg by mouth daily as needed for allergies.   Menaquinone-7 (VITAMIN K2 PO) Take 1 tablet by mouth daily.   Milk Thistle 250 MG CAPS Take 1 capsule by mouth 2 (two) times daily.   Multiple Vitamins-Minerals (ICAPS PO) Take 1 capsule by mouth 2 (two) times daily.   omeprazole (PRILOSEC) 20 MG capsule Take 20 mg by mouth daily.   propranolol (INDERAL) 10 MG tablet Take 10 mg by mouth 2 (two) times daily.   psyllium (HYDROCIL/METAMUCIL) 95 % PACK Take 1 packet by mouth 2 (two) times daily.   Red Yeast Rice Extract (RED YEAST RICE PO) Take 1,200 mg by mouth 2 (two) times daily.   triamterene-hydrochlorothiazide (MAXZIDE-25) 37.5-25 MG tablet Take 0.5 tablets by mouth daily.     Allergies:   Ace inhibitors, Amoxicillin, Azathioprine, Celebrex [celecoxib], Enalapril, Fentanyl, Fish oil, Forteo [parathyroid hormone (recomb)], Gabapentin, Lanolin acid [lanolin], Levofloxacin, Methotrexate derivatives, Nickel, Other, Penicillins, Prednisone,  Propoxyphene hcl, Sulfonamide derivatives, Synvisc [hylan g-f 20], Tramadol, and Sulfa antibiotics   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, nickel  03/31/2017   Anxiety    Arthritis    knees   Cancer ([facility])    basal cell- nose   GERD (gastroesophageal reflux disease)    Hyperlipidemia    Hypertension    Meningioma ([facility])    Neuromuscular disorder ([facility])    benign tremor- takes Propanolol   PONV (postoperative nausea and vomiting)    "BP DROPS ALSO"   Trigeminal neuralgia of right side of face     Past Surgical History:  Procedure Laterality Date   59 HOUR [place] STUDY N/A 04/17/2016   Procedure: [place] STUDY;  Surgeon: Manus Gunning, MD;  Location: [facility];  Service: Gastroenterology;  Laterality: N/A;   ESOPHAGEAL MANOMETRY N/A 04/17/2016   Procedure: ESOPHAGEAL MANOMETRY (EM);  Surgeon: Manus Gunning, MD;  Location: [facility];  Service: Gastroenterology;  Laterality: N/A;   EXTERNAL FIXATION LEG Left 03/28/2017   [place]   EXTERNAL FIXATION LEG Left 03/28/2017   Procedure: EXTERNAL FIXATION LEG;  Surgeon: Renette Butters, MD;  Location: [facility];  Service: Orthopedics;  Laterality: Left;   EXTERNAL FIXATION REMOVAL Left 05/08/2017   Procedure: REMOVAL  EXTERNAL FIXATION LEFT  LEG WITH HARDWARE REMOVAL;  Surgeon: Renette Butters, MD;  Location: [facility];  Service: Orthopedics;  Laterality: Left;   fractured femur  05/2012   left - rod -screws placed   HARDWARE REMOVAL Left 04/16/2013   Procedure: REMOVAL OF HARDWARE OF LEFT KNEE (DISTAL INTERLOC SCREW);  Surgeon: Gearlean Alf, MD;  Location: [facility];  Service: Orthopedics;  Laterality: Left;   KNEE SURGERY     x 3 left / x1 rt knee   LEFT HEART CATH AND CORONARY ANGIOGRAPHY N/A 09/18/2016   Procedure: Left Heart Cath and Coronary Angiography;  Surgeon: Sherren Mocha, MD;  Location: [facility];  Service: Cardiovascular;  Laterality: N/A;   NASAL  SINUS SURGERY     2000   OTHER SURGICAL HISTORY  2014   titanium rod in left femur used for fracture repair   SHOULDER SURGERY     bilateral shoulders    WISDOM TOOTH EXTRACTION       Social History:  The patient  reports that she has never smoked. She has never used smokeless tobacco. She reports current alcohol use. She reports that she does not use drugs.   Family History:  The patient's family history includes Allergies in her father; Brain cancer in her brother; Heart attack (age of onset: 84) in her father; Heart disease (age of onset: 94) in her mother; Lung cancer in her father; Stomach cancer in her mother.    ROS:  Please see the history of present illness. All other systems are reviewed and  Negative to the above problem except as noted.    PHYSICAL EXAM: VS:  BP 126/82    Pulse 68    Ht 5' 2.5" (1.588 m)    Wt 182 lb 3.2 oz (82.6 kg)    SpO2 96%    BMI 32.79 kg/m   GEN: Obese 70 yo , in no acute distress  HEENT: normal  Neck: no JVD,  Cardiac: RRR; No murmurs  ,no LE  edema  Respiratory:  clear to auscultation bilaterally, GI: soft, + BS  No hepatomegaly  MS: no deformity Moving all extremities   Skin: warm and dry, no rash  EKG:  EKG is not ordered today.    Lipid Panel    Component Value Date/Time   CHOL 201 (H) 04/20/2020 1212   TRIG 183 (H) 04/20/2020 1212   HDL 40 04/20/2020 1212   CHOLHDL 5.0 (H) 04/20/2020 1212   CHOLHDL 4.2 09/17/2016 0243   VLDL 24 09/17/2016 0243   LDLCALC 128 (H) 04/20/2020 1212      Wt Readings from Last 3 Encounters:  04/20/20 182 lb 3.2 oz (82.6 kg)  12/01/19 186 lb 9.6 oz (84.6 kg)  10/23/18 168 lb 1.9 oz (76.3 kg)      ASSESSMENT AND PLAN:  1  HTN  BP is controlled on hcurrent regimen   Tolerating  meds  Continue   WIll check BMET  2  Palpitations  Patient denies     3  Lipids  Will get lipid panel today    4  Diet   Discussed diet   Pt would like to lose wt   Has signif sugar (honey yogurt, granola)    Discussed 24 g added pre day   TRE.    WIll check CBC, TSH and VIt D  Plan for f/u in October         Current medicines are reviewed at length with  the patient today.  The patient does not have concerns regarding medicines.  Signed, Barbara Carnes, MD  04/20/2020 10:16 PM    [facility] [place], [place], [place]  [address] Phone: [phone]; Fax: [phone]

## 2020-04-20 ENCOUNTER — Encounter: Payer: Self-pay | Admitting: Internal Medicine

## 2020-04-20 ENCOUNTER — Other Ambulatory Visit: Payer: Self-pay

## 2020-04-20 ENCOUNTER — Ambulatory Visit: Payer: Medicare PPO | Admitting: Internal Medicine

## 2020-04-20 VITALS — BP 126/82 | HR 68 | Ht 62.5 in | Wt 182.2 lb

## 2020-04-20 DIAGNOSIS — E559 Vitamin D deficiency, unspecified: Secondary | ICD-10-CM

## 2020-04-20 DIAGNOSIS — E785 Hyperlipidemia, unspecified: Secondary | ICD-10-CM

## 2020-04-20 DIAGNOSIS — I10 Essential (primary) hypertension: Secondary | ICD-10-CM

## 2020-04-20 NOTE — Patient Instructions (Signed)
Medication Instructions:  No changes *If you need a refill on your cardiac medications before your next appointment, please call your pharmacy*   Lab Work: Today: BMET, CBC, LIPIDS, TSH, VIT D  If you have labs (blood work) drawn today and your tests are completely normal, you will receive your results only by: Marland Kitchen MyChart Message (if you have MyChart) OR . A paper copy in the mail If you have any lab test that is abnormal or we need to change your treatment, we will call you to review the results.   Testing/Procedures: none   Follow-Up: At [facility], you and your health needs are our priority.  As part of our continuing mission to provide you with exceptional heart care, we have created designated Provider Care Teams.  These Care Teams include your primary Cardiologist (physician) and Advanced Practice Providers (APPs -  Physician Assistants and Nurse Practitioners) who all work together to provide you with the care you need, when you need it.  Your next appointment:   8 month(s) (October)  The format for your next appointment:   In Person  Provider:   You may see Dorris Carnes, MD or one of the following Advanced Practice Providers on your designated Care Team:    Richardson Dopp, PA-C  Robbie Lis, [place]    Other Instructions

## 2020-04-21 DIAGNOSIS — M170 Bilateral primary osteoarthritis of knee: Secondary | ICD-10-CM | POA: Diagnosis not present

## 2020-04-21 LAB — LIPID PANEL
Chol/HDL Ratio: 5 ratio — ABNORMAL HIGH (ref 0.0–4.4)
Cholesterol, Total: 201 mg/dL — ABNORMAL HIGH (ref 100–199)
HDL: 40 mg/dL (ref >39)
LDL Chol Calc (NIH): 128 mg/dL — ABNORMAL HIGH (ref 0–99)
Triglycerides: 183 mg/dL — ABNORMAL HIGH (ref 0–149)
VLDL Cholesterol Cal: 33 mg/dL (ref 5–40)

## 2020-04-21 LAB — CBC
Hematocrit: 41.4 % (ref 34.0–46.6)
Hemoglobin: 14.1 g/dL (ref 11.1–15.9)
MCH: 29.6 pg (ref 26.6–33.0)
MCHC: 34.1 g/dL (ref 31.5–35.7)
MCV: 87 fL (ref 79–97)
Platelets: 302 10*3/uL (ref 150–450)
RBC: 4.77 x10E6/uL (ref 3.77–5.28)
RDW: 13.1 % (ref 11.7–15.4)
WBC: 6.5 10*3/uL (ref 3.4–10.8)

## 2020-04-21 LAB — BASIC METABOLIC PANEL
BUN/Creatinine Ratio: 15 (ref 12–28)
BUN: 12 mg/dL (ref 8–27)
CO2: 25 mmol/L (ref 20–29)
Calcium: 9.6 mg/dL (ref 8.7–10.3)
Chloride: 101 mmol/L (ref 96–106)
Creatinine, Ser: 0.79 mg/dL (ref 0.57–1.00)
GFR calc Af Amer: 88 mL/min/{1.73_m2} (ref >59)
GFR calc non Af Amer: 76 mL/min/{1.73_m2} (ref >59)
Glucose: 103 mg/dL — ABNORMAL HIGH (ref 65–99)
Potassium: 5.2 mmol/L (ref 3.5–5.2)
Sodium: 140 mmol/L (ref 134–144)

## 2020-04-21 LAB — VITAMIN D 25 HYDROXY (VIT D DEFICIENCY, FRACTURES): Vit D, 25-Hydroxy: 32.7 ng/mL (ref 30.0–100.0)

## 2020-04-21 LAB — TSH: TSH: 1.4 u[IU]/mL (ref 0.450–4.500)

## 2020-04-22 ENCOUNTER — Telehealth: Payer: Self-pay | Admitting: Internal Medicine

## 2020-04-22 NOTE — Telephone Encounter (Signed)
Called patient back about her message. Patient stated that maxzide 37.5-25 mg 1/2 tablet daily is causing her to have joint issues and difficulty walking. Patient stated she has been taking it for about 4 weeks. Patient stated her BP is fine around 120/82 and HR 70's.  Patient stated it has been effecting her ankles, knees, and back. Patient stated the Pharm D had told her she might could take it every other day. Will forward to Dr. Harrington Challenger and Pharm D for advisement.

## 2020-04-22 NOTE — Telephone Encounter (Signed)
Contacted patient   Discussed knee swelling and  leg pain  Would recomm ESR, uric acid    Pt has appt with primary  Please fax order  to office  in early am  (Dr Stephanie Acre)

## 2020-04-22 NOTE — Telephone Encounter (Signed)
Pt c/o medication issue:  1. Name of Medication: triamterene-hydrochlorothiazide (MAXZIDE-25) 37.5-25 MG tablet  2. How are you currently taking this medication (dosage and times per day)? 1/2 tablet daily  3. Are you having a reaction (difficulty breathing--STAT)?   4. What is your medication issue? Patient experiencing joint issues and difficulty walking  Patient wanted to know if she could just go off the medication temporarily and see if the joint pain resolves itself. Please advise

## 2020-04-23 DIAGNOSIS — Z79899 Other long term (current) drug therapy: Secondary | ICD-10-CM | POA: Diagnosis not present

## 2020-04-23 DIAGNOSIS — K219 Gastro-esophageal reflux disease without esophagitis: Secondary | ICD-10-CM | POA: Diagnosis not present

## 2020-04-23 DIAGNOSIS — Z0000 Encounter for general adult medical examination without abnormal findings: Secondary | ICD-10-CM | POA: Diagnosis not present

## 2020-04-23 DIAGNOSIS — I10 Essential (primary) hypertension: Secondary | ICD-10-CM | POA: Diagnosis not present

## 2020-04-23 DIAGNOSIS — Z131 Encounter for screening for diabetes mellitus: Secondary | ICD-10-CM | POA: Diagnosis not present

## 2020-04-23 DIAGNOSIS — M2550 Pain in unspecified joint: Secondary | ICD-10-CM | POA: Diagnosis not present

## 2020-04-23 DIAGNOSIS — E559 Vitamin D deficiency, unspecified: Secondary | ICD-10-CM | POA: Diagnosis not present

## 2020-04-23 DIAGNOSIS — M4802 Spinal stenosis, cervical region: Secondary | ICD-10-CM | POA: Diagnosis not present

## 2020-04-23 DIAGNOSIS — G250 Essential tremor: Secondary | ICD-10-CM | POA: Diagnosis not present

## 2020-04-23 NOTE — Telephone Encounter (Signed)
Called Dr. Myer Peer office.  Pt was there today, had labs including uric acid drawn.

## 2020-04-26 NOTE — Telephone Encounter (Signed)
  Pt c/o medication issue:  1. Name of Medication:  triamterene-hydrochlorothiazide (MAXZIDE-25) 37.5-25 MG tablet  2. How are you currently taking this medication (dosage and times per day)?   3. Are you having a reaction (difficulty breathing--STAT)?  Knees, ankle and heals hurt, lower back pain  4. What is your medication issue?   Patient is calling back today to report that she stopped taking the triamterene-hydrochlorothiazide (MAXZIDE-25) 37.5-25 MG tablet and she felt better and had no pain. She took it again and the pain came back. She has taken her blood pressure and it has been fine on the days she didn't take the medicine. She would like to discuss. Please see previous note where this medication was discussed last week. Dr Stephanie Acre did more blood work on Friday 04/23/20.

## 2020-04-27 ENCOUNTER — Telehealth: Payer: Self-pay | Admitting: *Deleted

## 2020-04-27 DIAGNOSIS — E785 Hyperlipidemia, unspecified: Secondary | ICD-10-CM

## 2020-04-27 MED ORDER — ROSUVASTATIN CALCIUM 5 MG PO TABS: 5.0000 mg | ORAL_TABLET | Freq: Every day | ORAL | 3 refills | Status: DC

## 2020-04-27 NOTE — Telephone Encounter (Signed)
Patient was having a lot of pain in knees, ankles, low back and groin.  She stopped Maxzide Friday and by lunch time better.  So Saturday she tried another dose and pains returned.  Was better Sunday and has not restarted this.    Off currently.  BP has been stable 120-130 tops / 76.  Would prefer to stay off this.  She wonders if the hctz was the culprit dehydrating her because she lost 7 pounds the first week and had dry mouth.  Had been taking since January.  Can take something for several weeks before she reacts to it.....  Will continue to monitor BP at home. Had uric acid level checked at PCP which was normal.  She had a similar reaction many years ago to a medication an immunologist prescribed.    PCP is advising to increase propranolol to TID because her tremors are worsening.   She hopes this helps out BP a little bit too.  Will monitor.

## 2020-04-27 NOTE — Telephone Encounter (Signed)
Agree with holding diuretic and increasing propranolol FOllow BP INteresting that I saw her this month and she had no complaints except for mild tremor

## 2020-04-27 NOTE — Telephone Encounter (Signed)
-----   Message from Fay Records, MD sent at 04/22/2020 11:24 AM EST ----- CBC is normal Thyoid function is normal Electrolytes and kidney funciton are OK Lipids:   Triglycerides are high   Watch/cut back on sweets, sugars LDL is 128   She had a head CT in past that showed mild plaquing on carotid arteries   With this I think she should be on something other that red yeast rice  Not effective   It is a "statin like " compound  Not regulalated though  Dont know batch to batch what getting  Would stop this and try Crestor 5 mg   F/U lipids and AST in 8 wks

## 2020-04-27 NOTE — Addendum Note (Signed)
Addended by: Rodman Key on: 04/27/2020 05:05 PM   Modules accepted: Orders

## 2020-04-27 NOTE — Telephone Encounter (Signed)
Patient has been informed of the lab results and recommendations to stop red yeast rice and start rosuvastatin 5 mg daily.  She will do so and return in 8 weeks for labs.

## 2020-05-13 ENCOUNTER — Other Ambulatory Visit: Payer: Self-pay

## 2020-05-13 NOTE — Telephone Encounter (Signed)
Pt calling requesting a refill on propranolol. Dr. Harrington Challenger did not prescribed this medication. Would Dr. Harrington Challenger like to refill this medication? Please address

## 2020-06-14 DIAGNOSIS — G4733 Obstructive sleep apnea (adult) (pediatric): Secondary | ICD-10-CM | POA: Diagnosis not present

## 2020-06-22 ENCOUNTER — Telehealth: Payer: Self-pay | Admitting: Internal Medicine

## 2020-06-22 DIAGNOSIS — M79604 Pain in right leg: Secondary | ICD-10-CM

## 2020-06-22 DIAGNOSIS — R609 Edema, unspecified: Secondary | ICD-10-CM

## 2020-06-22 DIAGNOSIS — M79605 Pain in left leg: Secondary | ICD-10-CM

## 2020-06-22 NOTE — Telephone Encounter (Signed)
Pt c/o medication issue:  1. Name of Medication:  rosuvastatin (CRESTOR) 5 MG tablet  2. How are you currently taking this medication (dosage and times per day)? Not currently taking, stopped taking yesterday  3. Are you having a reaction (difficulty breathing--STAT)? Yes,  Legs, ankles and heels swelling   4. What is your medication issue? PT is calling stating that the medication is causing the same reaction the previous medication she was on caused.   Pt c/o swelling: STAT is pt has developed SOB within 24 hours  1) How much weight have you gained and in what time span? Unsure   2) If swelling, where is the swelling located? Legs and ankles and heels of feet.  3) Are you currently taking a fluid pill? No   4) Are you currently SOB?  No   5) Do you have a log of your daily weights (if so, list)? No   6) Have you gained 3 pounds in a day or 5 pounds in a week? No   7) Have you traveled recently? No

## 2020-06-22 NOTE — Telephone Encounter (Signed)
Both ankles are swollen and painful, including heels.  Heels are worst.  Also pain on insides of knee joints also.  Noticed earlier but it has slowly gotten worse.    Unable to ride her stationary bike, hurting while walking.  Took last dose yesterday.  She is going to hold for 2 weeks and then let us know how she is feeling on it.    She is still planning to come for the lipid check this Friday.

## 2020-06-22 NOTE — Telephone Encounter (Signed)
Agree with plan   Would check CMET, CK  lipids and ESR as well  I am sorry she is feeling bad.

## 2020-06-25 ENCOUNTER — Other Ambulatory Visit: Payer: Self-pay

## 2020-06-25 ENCOUNTER — Other Ambulatory Visit: Payer: Medicare PPO | Admitting: *Deleted

## 2020-06-25 DIAGNOSIS — R609 Edema, unspecified: Secondary | ICD-10-CM | POA: Diagnosis not present

## 2020-06-25 DIAGNOSIS — M79605 Pain in left leg: Secondary | ICD-10-CM | POA: Diagnosis not present

## 2020-06-25 DIAGNOSIS — E785 Hyperlipidemia, unspecified: Secondary | ICD-10-CM

## 2020-06-25 DIAGNOSIS — M79604 Pain in right leg: Secondary | ICD-10-CM

## 2020-06-25 NOTE — Telephone Encounter (Signed)
Notified pt of additional labs. She is already improving since holding Crestor. Also notes BP has been "amazing" since taking propranolol TID.  Wanted to make sure Dr. Harrington Challenger is aware.

## 2020-06-26 LAB — COMPREHENSIVE METABOLIC PANEL
ALT: 39 IU/L — ABNORMAL HIGH (ref 0–32)
AST: 38 IU/L (ref 0–40)
Albumin/Globulin Ratio: 1.7 (ref 1.2–2.2)
Albumin: 4.5 g/dL (ref 3.8–4.8)
Alkaline Phosphatase: 81 IU/L (ref 44–121)
BUN/Creatinine Ratio: 19 (ref 12–28)
BUN: 16 mg/dL (ref 8–27)
Bilirubin Total: 1 mg/dL (ref 0.0–1.2)
CO2: 24 mmol/L (ref 20–29)
Calcium: 9.1 mg/dL (ref 8.7–10.3)
Chloride: 102 mmol/L (ref 96–106)
Creatinine, Ser: 0.85 mg/dL (ref 0.57–1.00)
Globulin, Total: 2.7 g/dL (ref 1.5–4.5)
Glucose: 102 mg/dL — ABNORMAL HIGH (ref 65–99)
Potassium: 4.7 mmol/L (ref 3.5–5.2)
Sodium: 141 mmol/L (ref 134–144)
Total Protein: 7.2 g/dL (ref 6.0–8.5)
eGFR: 74 mL/min/{1.73_m2} (ref >59)

## 2020-06-26 LAB — CK: Total CK: 65 U/L (ref 32–182)

## 2020-06-26 LAB — LIPID PANEL
Chol/HDL Ratio: 4.3 ratio (ref 0.0–4.4)
Cholesterol, Total: 179 mg/dL (ref 100–199)
HDL: 42 mg/dL (ref >39)
LDL Chol Calc (NIH): 113 mg/dL — ABNORMAL HIGH (ref 0–99)
Triglycerides: 132 mg/dL (ref 0–149)
VLDL Cholesterol Cal: 24 mg/dL (ref 5–40)

## 2020-06-26 LAB — SEDIMENTATION RATE: Sed Rate: 2 mm/hr (ref 0–40)

## 2020-06-28 ENCOUNTER — Telehealth: Payer: Self-pay | Admitting: *Deleted

## 2020-06-28 DIAGNOSIS — E785 Hyperlipidemia, unspecified: Secondary | ICD-10-CM

## 2020-06-28 NOTE — Telephone Encounter (Signed)
See lab results 06/28/20 for details.

## 2020-06-28 NOTE — Telephone Encounter (Signed)
-----  Message from Fay Records, MD sent at 06/25/2020  5:24 PM EDT ----- CK is normal  ESR is normal   NO evidence of inflammation Kidney funcition normal   Electrolytes are normal  Liver function is minimally off   Not significant  Lipids are only mildly improved With cerebrovascular dz on CT she should be on something to keep lipids down, do not want progressoin Would refer to lipid clinic for poss Repatha

## 2020-07-09 DIAGNOSIS — I10 Essential (primary) hypertension: Secondary | ICD-10-CM | POA: Diagnosis not present

## 2020-07-09 DIAGNOSIS — Z889 Allergy status to unspecified drugs, medicaments and biological substances status: Secondary | ICD-10-CM | POA: Diagnosis not present

## 2020-07-09 DIAGNOSIS — E785 Hyperlipidemia, unspecified: Secondary | ICD-10-CM | POA: Diagnosis not present

## 2020-07-19 NOTE — Progress Notes (Signed)
Patient ID: OMNIA DOLLINGER                 DOB: 1950-03-27                    MRN: [id number]     HPI: Barbara [name] is a 70 y.o. female patient referred to lipid clinic by Dr. Harrington Challenger. PMH is significant for HTN, HLD, GERD and chest pain. Coronary arteries were normal on cath in 2018. Carotid artery stenosis 1-39% in 2018.   Patient was tried of rosuvastatin 5mg  daily but had a horrible reaction to it per her report. Patient states that she is sensitive to medications and has had bad reactions to them. In response she is being treated for those reactions. She also reports pain in the groin, knees, ankles and heels to both rosuvastatin and maxzide. She brings in a list of blood pressures while off of maxzide. BP are at goal of <130/80.  Current Medications: none Intolerances: fish oil? Rosuvastatin (pain in groin, knee ankles and heels) Risk Factors:  Family hx of premature CAD, HTN LDL goal: <100  Diet: breakfast: great grain cereal with berries and skim milk, occassionally a bagel with low fat cream cheese (once or twice a month) Lunch: celery with pimento cheese, kale, deli roast beef- cruchy BP with celery, apples, grapes, almond or pecan crackers, daves good seed bread when she does eat bread Dinner: rotisserie chicken on a salad, chili with chicken or lean ground beef w/ little sour cream Drinks: water, hot tea w/ stevia Snack: Walnuts, pistachios, ginger snaps, cowboy cookies with stevia/truvia w/ semi sweet chocolate chips, bananas  Exercise: rides bike  Family History: The patient's family history includes Allergies in her father; Brain cancer in her brother; Heart attack (age of onset: 72) in her father; Heart disease (age of onset: 85) in her mother; Lung cancer in her father; Stomach cancer in her mother.   Social History: The patient  reports that she has never smoked. She has never used smokeless tobacco. She reports current alcohol use. She reports that she does not use  drugs.  Labs:06/25/20 TC 179, TG 132, HDL 42, LDL 113   Past Medical History:  Diagnosis Date  . 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 Anxiety   . Arthritis    knees  . Cancer ([place])    basal cell- nose  . GERD (gastroesophageal reflux disease)   . Hyperlipidemia   . Hypertension   . Meningioma ([place])   . Neuromuscular disorder ([place])    benign tremor- takes Propanolol  . PONV (postoperative nausea and vomiting)    "BP DROPS ALSO"  . Trigeminal neuralgia of right side of face     Current Outpatient Medications on File Prior to Visit  Medication Sig Dispense Refill  . b complex vitamins tablet Take 1 tablet by mouth daily.    . Cholecalciferol (VITAMIN D-3) 25 MCG (1000 UT) CAPS Take 1 capsule by mouth daily.    Marland Kitchen guaiFENesin (MUCINEX) 600 MG 12 hr tablet Take by mouth as needed.    Marland Kitchen levocetirizine (XYZAL) 5 MG tablet Take 5 mg by mouth daily as needed for allergies.     Marland Kitchen loratadine (CLARITIN) 10 MG tablet Take 10 mg by mouth daily as needed for allergies.    . Menaquinone-7 (VITAMIN K2 PO) Take 1 tablet by mouth daily.    . Milk Thistle 250 MG CAPS Take 1 capsule by mouth 2 (two) times daily.    Marland Kitchen  Multiple Vitamins-Minerals (ICAPS PO) Take 1 capsule by mouth 2 (two) times daily.    Marland Kitchen omeprazole (PRILOSEC) 20 MG capsule Take 20 mg by mouth daily.    . propranolol (INDERAL) 10 MG tablet Take 10 mg by mouth 3 (three) times daily.    . psyllium (HYDROCIL/METAMUCIL) 95 % PACK Take 1 packet by mouth 2 (two) times daily.    . rosuvastatin (CRESTOR) 5 MG tablet Take 1 tablet (5 mg total) by mouth daily. (Patient not taking: Reported on 06/22/2020) 90 tablet 3   No current facility-administered medications on file prior to visit.    Allergies  Allergen Reactions  . Ace Inhibitors     Pt unsure- nausea or hives  . Amoxicillin Hives  . Azathioprine Other (See Comments)    "cardiac issues"  . Celebrex [Celecoxib] Other (See Comments)    Severe stomach cramps  . Crestor  [Rosuvastatin] Other (See Comments)    Bilat leg/heel pains/swelling  . Enalapril Other (See Comments)    congestion  . Fentanyl Nausea And Vomiting  . Fish Oil Other (See Comments)    Broke out with blisters  . Forteo [Parathyroid Hormone (Recomb)]   . Gabapentin     Pain and swelling   . Lanolin Acid [Lanolin]     rash  . Levofloxacin Other (See Comments)    "sore ankles"  . Maxzide [Triamterene-Hctz] Other (See Comments)    Knee, ankles and back pain  . Methotrexate Derivatives Other (See Comments)    "cardiac issues"  . Nickel     rash  . Other Nausea And Vomiting    Narcotic Pain Killers- vomitting, nausea constantly  . Penicillins Hives    Has patient had a PCN reaction causing immediate rash, facial/tongue/throat swelling, SOB or lightheadedness with hypotension: unknown Has patient had a PCN reaction causing severe rash involving mucus membranes or skin necrosis: no Has patient had a PCN reaction that required hospitalization - no, was at MD office Has patient had a PCN reaction occurring within the last 10 years: no If all of the above answers are "NO", then may proceed with Cephalosporin use.   . Prednisone Other (See Comments)    High blood pressure.  . Propoxyphene Hcl Nausea Only  . Sulfonamide Derivatives Nausea Only and Other (See Comments)    Stomach cramping  . Synvisc [Hylan G-F 20]     Made knee swell up at injection site  . Tramadol   . Sulfa Antibiotics Nausea Only, Rash and Nausea And Vomiting    Assessment/Plan:  1. Hyperlipidemia - Patient LDL is above goal of <100 for primary prevention. She does not wish to take medications for cholesterol. We did discuss evidence in primary prevention for statins and PCSK9i. Her insurance also historically wont pay for PCSK9i for primary prevention, patient also uninterested in trying. We had a extensive conversation about diet. Large amount of time discussing the dangers of refined grains, ultra processed foods,  sugar and hidden added sugars. We also discussed time restricted eating. Overall patients diet is decent. We discussed a few ways it could be improved. Patient will work on diet and lifestyle and has declined medication for primary prevention.   Thank you,   Ramond Dial, Pharm.D, BCPS, CPP [facility]  [address] [place], [place] [address]  Phone: [phone]; Fax: [phone]

## 2020-07-20 ENCOUNTER — Ambulatory Visit (INDEPENDENT_AMBULATORY_CARE_PROVIDER_SITE_OTHER): Payer: Medicare PPO | Admitting: Pharmacist

## 2020-07-20 ENCOUNTER — Other Ambulatory Visit: Payer: Self-pay

## 2020-07-20 DIAGNOSIS — I10 Essential (primary) hypertension: Secondary | ICD-10-CM | POA: Diagnosis not present

## 2020-07-20 DIAGNOSIS — E7800 Pure hypercholesterolemia, unspecified: Secondary | ICD-10-CM | POA: Diagnosis not present

## 2020-07-20 NOTE — Patient Instructions (Signed)
TIPS for Living a healthier life  SUGAR  Sugar is a huge problem in the modern day diet. Sugar is a HUGE contributor to heart disease, diabetes, high triglyceride levels, fatty liver diease and obesity. Sugar is hidden in almost all packaged foods/beverages. Added sugar is extra sugar that is added beyond what is naturally found. It adds no nutritional benefit to your body and can cause major harm. The American Heart Association recommends limiting added sugars to no more than 25g for women and 36 grams for men per day.  There are many names for sugar maltose, sucrose (names ending in "ose"), high fructose corn syrup, molasses, cane sugar, corn sweetener, raw sugar, syrup, honey or fruit juice concentrate.   One of the best ways to limit your added sugars is to stop drinking sweetened beverages such as soda, sweet tea, fruit juice or fancy coffee's. There is 65g of added sugars in one 20oz bottle of Coke!! That is equal to 6 donuts.   Pay attention and read all nutrition facts labels. Below is an examples of a nutrition facts label. The #1 is showing you the total sugars where the # 2 is showing you the added sugars. This one severing has almost the max amount of added sugars per day!  Watch out for items that say "low fat" or "no added sugar" as these products are typically very high in sugar. The food industry uses these terms to fool you into thinking they are healthy.  For more information on the dangers of sugar watch WHY Sugar is as Bad as Alcohol (Fructose, The Liver Toxin) on YouTube.    EXERCISE  Exercise is good. We've all heard that. In an ideal world, we would all have time and resources to  get plenty of it. When you are active your heart pumps more efficiently and you will feel better.  Multiple studies show that even walking regularly has benefits that include living a longer life.  The American Heart Association recommends 90-150 minutes per week of exercise (30 minutes  per  day most days of the week). You can do this in any increment you wish. Nine or more  10-minute walks count. So does an hour-long exercise class. Break the time apart into what will  work in your life. Some of the best things you can do include walking briskly, jogging, cycling or  swimming laps. Not everyone is ready to "exercise." Sometimes we need to start with just getting active. Here  are some easy ways to be more active throughout the day: Marland Kitchen Take the stairs instead of the elevator . Go for a 10-15 minute walk during your lunch break (find a friend to make it more enjoyable) . When shopping, park at the back of the parking lot . If you take public transportation, get off one stop early and walk the extra distance . Pace around while making phone calls (most of [place] are not attached to phone cords any longer!) Check with your doctor if you aren't sure what your limitations may be. Always remember to drink plenty of water when doing any type of exercise. Don't feel like a failure if you're not getting the 90-150 minutes per week. If you started by being  a couch potato, then just a 10-minute walk each day is a huge improvement. Start with little  victories and work your way up.   Healthy Eating Tips  When looking to improve your eating habits, whether to lose weight, lower blood pressure  or just be healthier, it helps to know what a serving size is.   Grains 1 slice of bread,  bagel,  cup pasta or rice  Vegetables 1 cup fresh or raw vegetables,  cup cooked or canned Fruits 1 piece of medium sized fruit,  cup canned,   Meats/Proteins  cup dried       1 oz meat, 1 egg,  cup cooked beans, nuts or seeds  Dairy        Fats Individual yogurt container, 1 cup (8oz)    1 teaspoon margarine/butter or vegetable  milk or milk alternative, 1 slice of cheese          oil; 1 tablespoon mayonnaise or salad dressing                  Plan ahead: make a menu of the meals for a week then  create a grocery list to go with  that menu. Consider meals that easily stretch into a night of leftovers, such as stews or  casseroles. Or consider making two of your favorite meal and put one in the freezer or fridge for  another night.  When you get home from the grocery store wash and prepare your vegetables and fruits.  Then when you need them they are ready to go.  Tips for going to the grocery store: . Buy store or generic brands . Check the weekly ad from your store on-line or in their in-store flyer . Look at the unit price on the shelf tag to compare/contrast the costs of different items . Buy fruits/vegetables in season . Carrots, bananas and apples are low-cost, naturally healthy items . If meats or frozen vegetables are on sale, buy some extras and put in your freezer . Limit buying prepared or "ready to eat" items, even if they are pre-made salads or fruit snacks . Do not shop when you're hungry . Foods at eye level tend to be more expensive. Look on the high and low shelves for deals. . Consider shopping at the farmer's market for fresh foods in season. . Choose canned tuna or salmon instead of fresh . Avoid the cookie and chip aisles (these are expensive, high in calories and low in  nutritional value). Shop on the outside of the grocery store.  Aim to have one 12 hour fast each day. This means no eating after dinner until breakfast. For example, if you eat dinner around 6 PM then you would not eat anything until 6 AM the next day. This is a great way to help lower your insulin levels, lose weight and reduce your blood pressure.   Healthy food preparations: . If you can't get lean hamburger, be sure to drain the fat when cooking . Steam, saut (in olive oil), grill or bake foods . Experiment with different seasonings to avoid adding salt to your foods. Kosher salt, sea salt and Himalayan salt are all still salt and should be avoided Try seasoning food with onion, garlic,  thyme, rosemary, basil ect. Onion powder or garlic powder is ok. Avoid if it says salt (ie garlic salt).        Resources: American Heart Association - InstantFinish.fi Go to the Healthy Living tab to get more information American Diabetes Association - www.diabetes.org You don't have to be diabetic - check out the Food and Fitness tab

## 2020-07-26 DIAGNOSIS — M25571 Pain in right ankle and joints of right foot: Secondary | ICD-10-CM | POA: Diagnosis not present

## 2020-07-26 DIAGNOSIS — M25572 Pain in left ankle and joints of left foot: Secondary | ICD-10-CM | POA: Diagnosis not present

## 2020-08-02 DIAGNOSIS — M25672 Stiffness of left ankle, not elsewhere classified: Secondary | ICD-10-CM | POA: Diagnosis not present

## 2020-08-02 DIAGNOSIS — M25571 Pain in right ankle and joints of right foot: Secondary | ICD-10-CM | POA: Diagnosis not present

## 2020-08-02 DIAGNOSIS — M7662 Achilles tendinitis, left leg: Secondary | ICD-10-CM | POA: Diagnosis not present

## 2020-08-02 DIAGNOSIS — M25572 Pain in left ankle and joints of left foot: Secondary | ICD-10-CM | POA: Diagnosis not present

## 2020-08-05 DIAGNOSIS — M7662 Achilles tendinitis, left leg: Secondary | ICD-10-CM | POA: Diagnosis not present

## 2020-08-05 DIAGNOSIS — M25571 Pain in right ankle and joints of right foot: Secondary | ICD-10-CM | POA: Diagnosis not present

## 2020-08-05 DIAGNOSIS — M7661 Achilles tendinitis, right leg: Secondary | ICD-10-CM | POA: Diagnosis not present

## 2020-08-05 DIAGNOSIS — M25572 Pain in left ankle and joints of left foot: Secondary | ICD-10-CM | POA: Diagnosis not present

## 2020-08-11 DIAGNOSIS — M25672 Stiffness of left ankle, not elsewhere classified: Secondary | ICD-10-CM | POA: Diagnosis not present

## 2020-08-11 DIAGNOSIS — M25571 Pain in right ankle and joints of right foot: Secondary | ICD-10-CM | POA: Diagnosis not present

## 2020-08-11 DIAGNOSIS — M25572 Pain in left ankle and joints of left foot: Secondary | ICD-10-CM | POA: Diagnosis not present

## 2020-08-11 DIAGNOSIS — M7661 Achilles tendinitis, right leg: Secondary | ICD-10-CM | POA: Diagnosis not present

## 2020-08-13 DIAGNOSIS — M25672 Stiffness of left ankle, not elsewhere classified: Secondary | ICD-10-CM | POA: Diagnosis not present

## 2020-08-13 DIAGNOSIS — M7661 Achilles tendinitis, right leg: Secondary | ICD-10-CM | POA: Diagnosis not present

## 2020-08-13 DIAGNOSIS — M25571 Pain in right ankle and joints of right foot: Secondary | ICD-10-CM | POA: Diagnosis not present

## 2020-08-13 DIAGNOSIS — M7662 Achilles tendinitis, left leg: Secondary | ICD-10-CM | POA: Diagnosis not present

## 2020-08-13 DIAGNOSIS — M25572 Pain in left ankle and joints of left foot: Secondary | ICD-10-CM | POA: Diagnosis not present

## 2020-08-16 DIAGNOSIS — M7661 Achilles tendinitis, right leg: Secondary | ICD-10-CM | POA: Diagnosis not present

## 2020-08-16 DIAGNOSIS — M7662 Achilles tendinitis, left leg: Secondary | ICD-10-CM | POA: Diagnosis not present

## 2020-08-16 DIAGNOSIS — M25571 Pain in right ankle and joints of right foot: Secondary | ICD-10-CM | POA: Diagnosis not present

## 2020-08-16 DIAGNOSIS — M25572 Pain in left ankle and joints of left foot: Secondary | ICD-10-CM | POA: Diagnosis not present

## 2020-08-16 DIAGNOSIS — M25672 Stiffness of left ankle, not elsewhere classified: Secondary | ICD-10-CM | POA: Diagnosis not present

## 2020-08-20 DIAGNOSIS — M25572 Pain in left ankle and joints of left foot: Secondary | ICD-10-CM | POA: Diagnosis not present

## 2020-08-20 DIAGNOSIS — M25672 Stiffness of left ankle, not elsewhere classified: Secondary | ICD-10-CM | POA: Diagnosis not present

## 2020-08-20 DIAGNOSIS — M7661 Achilles tendinitis, right leg: Secondary | ICD-10-CM | POA: Diagnosis not present

## 2020-08-20 DIAGNOSIS — M7662 Achilles tendinitis, left leg: Secondary | ICD-10-CM | POA: Diagnosis not present

## 2020-08-24 DIAGNOSIS — M7661 Achilles tendinitis, right leg: Secondary | ICD-10-CM | POA: Diagnosis not present

## 2020-08-24 DIAGNOSIS — M25571 Pain in right ankle and joints of right foot: Secondary | ICD-10-CM | POA: Diagnosis not present

## 2020-08-24 DIAGNOSIS — M7662 Achilles tendinitis, left leg: Secondary | ICD-10-CM | POA: Diagnosis not present

## 2020-08-24 DIAGNOSIS — M25672 Stiffness of left ankle, not elsewhere classified: Secondary | ICD-10-CM | POA: Diagnosis not present

## 2020-08-26 DIAGNOSIS — M25572 Pain in left ankle and joints of left foot: Secondary | ICD-10-CM | POA: Diagnosis not present

## 2020-08-26 DIAGNOSIS — M25672 Stiffness of left ankle, not elsewhere classified: Secondary | ICD-10-CM | POA: Diagnosis not present

## 2020-08-26 DIAGNOSIS — M7662 Achilles tendinitis, left leg: Secondary | ICD-10-CM | POA: Diagnosis not present

## 2020-08-26 DIAGNOSIS — M25571 Pain in right ankle and joints of right foot: Secondary | ICD-10-CM | POA: Diagnosis not present

## 2020-08-31 DIAGNOSIS — M7662 Achilles tendinitis, left leg: Secondary | ICD-10-CM | POA: Diagnosis not present

## 2020-08-31 DIAGNOSIS — M25672 Stiffness of left ankle, not elsewhere classified: Secondary | ICD-10-CM | POA: Diagnosis not present

## 2020-08-31 DIAGNOSIS — M7661 Achilles tendinitis, right leg: Secondary | ICD-10-CM | POA: Diagnosis not present

## 2020-08-31 DIAGNOSIS — M25571 Pain in right ankle and joints of right foot: Secondary | ICD-10-CM | POA: Diagnosis not present

## 2020-09-07 DIAGNOSIS — M25571 Pain in right ankle and joints of right foot: Secondary | ICD-10-CM | POA: Diagnosis not present

## 2020-09-07 DIAGNOSIS — M25572 Pain in left ankle and joints of left foot: Secondary | ICD-10-CM | POA: Diagnosis not present

## 2020-09-07 DIAGNOSIS — M7662 Achilles tendinitis, left leg: Secondary | ICD-10-CM | POA: Diagnosis not present

## 2020-09-07 DIAGNOSIS — M25672 Stiffness of left ankle, not elsewhere classified: Secondary | ICD-10-CM | POA: Diagnosis not present

## 2020-09-09 DIAGNOSIS — M25571 Pain in right ankle and joints of right foot: Secondary | ICD-10-CM | POA: Diagnosis not present

## 2020-09-09 DIAGNOSIS — M25672 Stiffness of left ankle, not elsewhere classified: Secondary | ICD-10-CM | POA: Diagnosis not present

## 2020-09-09 DIAGNOSIS — M25572 Pain in left ankle and joints of left foot: Secondary | ICD-10-CM | POA: Diagnosis not present

## 2020-09-09 DIAGNOSIS — M7662 Achilles tendinitis, left leg: Secondary | ICD-10-CM | POA: Diagnosis not present

## 2020-09-14 DIAGNOSIS — M25571 Pain in right ankle and joints of right foot: Secondary | ICD-10-CM | POA: Diagnosis not present

## 2020-09-14 DIAGNOSIS — M7662 Achilles tendinitis, left leg: Secondary | ICD-10-CM | POA: Diagnosis not present

## 2020-09-14 DIAGNOSIS — M25672 Stiffness of left ankle, not elsewhere classified: Secondary | ICD-10-CM | POA: Diagnosis not present

## 2020-09-14 DIAGNOSIS — M7661 Achilles tendinitis, right leg: Secondary | ICD-10-CM | POA: Diagnosis not present

## 2020-09-22 DIAGNOSIS — M25572 Pain in left ankle and joints of left foot: Secondary | ICD-10-CM | POA: Diagnosis not present

## 2020-09-22 DIAGNOSIS — M25672 Stiffness of left ankle, not elsewhere classified: Secondary | ICD-10-CM | POA: Diagnosis not present

## 2020-09-22 DIAGNOSIS — M7662 Achilles tendinitis, left leg: Secondary | ICD-10-CM | POA: Diagnosis not present

## 2020-09-22 DIAGNOSIS — M25671 Stiffness of right ankle, not elsewhere classified: Secondary | ICD-10-CM | POA: Diagnosis not present

## 2020-09-24 DIAGNOSIS — M25572 Pain in left ankle and joints of left foot: Secondary | ICD-10-CM | POA: Diagnosis not present

## 2020-09-24 DIAGNOSIS — M7662 Achilles tendinitis, left leg: Secondary | ICD-10-CM | POA: Diagnosis not present

## 2020-09-24 DIAGNOSIS — M25672 Stiffness of left ankle, not elsewhere classified: Secondary | ICD-10-CM | POA: Diagnosis not present

## 2020-09-24 DIAGNOSIS — M7661 Achilles tendinitis, right leg: Secondary | ICD-10-CM | POA: Diagnosis not present

## 2020-12-25 DIAGNOSIS — J309 Allergic rhinitis, unspecified: Secondary | ICD-10-CM | POA: Diagnosis not present

## 2020-12-25 DIAGNOSIS — E785 Hyperlipidemia, unspecified: Secondary | ICD-10-CM | POA: Diagnosis not present

## 2020-12-25 DIAGNOSIS — I10 Essential (primary) hypertension: Secondary | ICD-10-CM | POA: Diagnosis not present

## 2020-12-25 DIAGNOSIS — M1990 Unspecified osteoarthritis, unspecified site: Secondary | ICD-10-CM | POA: Diagnosis not present

## 2020-12-25 DIAGNOSIS — K219 Gastro-esophageal reflux disease without esophagitis: Secondary | ICD-10-CM | POA: Diagnosis not present

## 2020-12-25 DIAGNOSIS — G250 Essential tremor: Secondary | ICD-10-CM | POA: Diagnosis not present

## 2020-12-25 DIAGNOSIS — H547 Unspecified visual loss: Secondary | ICD-10-CM | POA: Diagnosis not present

## 2020-12-25 DIAGNOSIS — M810 Age-related osteoporosis without current pathological fracture: Secondary | ICD-10-CM | POA: Diagnosis not present

## 2020-12-25 DIAGNOSIS — E669 Obesity, unspecified: Secondary | ICD-10-CM | POA: Diagnosis not present

## 2021-01-07 DIAGNOSIS — Z1231 Encounter for screening mammogram for malignant neoplasm of breast: Secondary | ICD-10-CM | POA: Diagnosis not present

## 2021-03-02 DIAGNOSIS — Z961 Presence of intraocular lens: Secondary | ICD-10-CM | POA: Diagnosis not present

## 2021-04-13 DIAGNOSIS — M170 Bilateral primary osteoarthritis of knee: Secondary | ICD-10-CM | POA: Diagnosis not present

## 2021-04-25 DIAGNOSIS — Z20822 Contact with and (suspected) exposure to covid-19: Secondary | ICD-10-CM | POA: Diagnosis not present

## 2021-04-27 DIAGNOSIS — M170 Bilateral primary osteoarthritis of knee: Secondary | ICD-10-CM | POA: Diagnosis not present

## 2021-05-04 DIAGNOSIS — M170 Bilateral primary osteoarthritis of knee: Secondary | ICD-10-CM | POA: Diagnosis not present

## 2021-05-18 DIAGNOSIS — E559 Vitamin D deficiency, unspecified: Secondary | ICD-10-CM | POA: Diagnosis not present

## 2021-05-18 DIAGNOSIS — Z0000 Encounter for general adult medical examination without abnormal findings: Secondary | ICD-10-CM | POA: Diagnosis not present

## 2021-05-18 DIAGNOSIS — Z79899 Other long term (current) drug therapy: Secondary | ICD-10-CM | POA: Diagnosis not present

## 2021-05-18 DIAGNOSIS — R7303 Prediabetes: Secondary | ICD-10-CM | POA: Diagnosis not present

## 2021-05-18 DIAGNOSIS — M4802 Spinal stenosis, cervical region: Secondary | ICD-10-CM | POA: Diagnosis not present

## 2021-05-18 DIAGNOSIS — E2839 Other primary ovarian failure: Secondary | ICD-10-CM | POA: Diagnosis not present

## 2021-05-18 DIAGNOSIS — I10 Essential (primary) hypertension: Secondary | ICD-10-CM | POA: Diagnosis not present

## 2021-05-18 DIAGNOSIS — M5412 Radiculopathy, cervical region: Secondary | ICD-10-CM | POA: Diagnosis not present

## 2021-05-18 DIAGNOSIS — G720 Drug-induced myopathy: Secondary | ICD-10-CM | POA: Diagnosis not present

## 2021-05-26 DIAGNOSIS — M810 Age-related osteoporosis without current pathological fracture: Secondary | ICD-10-CM | POA: Diagnosis not present

## 2021-05-26 DIAGNOSIS — M85851 Other specified disorders of bone density and structure, right thigh: Secondary | ICD-10-CM | POA: Diagnosis not present

## 2021-06-23 DIAGNOSIS — Z961 Presence of intraocular lens: Secondary | ICD-10-CM | POA: Diagnosis not present

## 2021-06-23 DIAGNOSIS — H26492 Other secondary cataract, left eye: Secondary | ICD-10-CM | POA: Diagnosis not present

## 2021-08-10 DIAGNOSIS — M170 Bilateral primary osteoarthritis of knee: Secondary | ICD-10-CM | POA: Diagnosis not present

## 2021-08-23 DIAGNOSIS — M7989 Other specified soft tissue disorders: Secondary | ICD-10-CM | POA: Diagnosis not present

## 2021-08-24 ENCOUNTER — Other Ambulatory Visit: Payer: Self-pay | Admitting: Family Medicine

## 2021-08-24 ENCOUNTER — Other Ambulatory Visit (HOSPITAL_BASED_OUTPATIENT_CLINIC_OR_DEPARTMENT_OTHER): Payer: Self-pay | Admitting: Family Medicine

## 2021-08-24 ENCOUNTER — Ambulatory Visit (HOSPITAL_BASED_OUTPATIENT_CLINIC_OR_DEPARTMENT_OTHER)
Admission: RE | Admit: 2021-08-24 | Discharge: 2021-08-24 | Disposition: A | Discharge status: Home / Self Care | Payer: Medicare PPO | Source: Ambulatory Visit | Attending: Family Medicine | Admitting: Family Medicine

## 2021-08-24 DIAGNOSIS — M7989 Other specified soft tissue disorders: Secondary | ICD-10-CM | POA: Diagnosis not present

## 2021-08-25 ENCOUNTER — Other Ambulatory Visit: Payer: Medicare PPO

## 2021-08-31 DIAGNOSIS — M545 Low back pain, unspecified: Secondary | ICD-10-CM | POA: Diagnosis not present

## 2021-09-06 ENCOUNTER — Ambulatory Visit (HOSPITAL_BASED_OUTPATIENT_CLINIC_OR_DEPARTMENT_OTHER): Payer: Medicare PPO | Attending: Orthopedic Surgery | Admitting: Physical Therapy

## 2021-09-06 DIAGNOSIS — M25552 Pain in left hip: Secondary | ICD-10-CM | POA: Diagnosis not present

## 2021-09-06 DIAGNOSIS — M6281 Muscle weakness (generalized): Secondary | ICD-10-CM | POA: Insufficient documentation

## 2021-09-06 DIAGNOSIS — M25551 Pain in right hip: Secondary | ICD-10-CM | POA: Diagnosis not present

## 2021-09-06 DIAGNOSIS — R262 Difficulty in walking, not elsewhere classified: Secondary | ICD-10-CM | POA: Insufficient documentation

## 2021-09-06 DIAGNOSIS — M25661 Stiffness of right knee, not elsewhere classified: Secondary | ICD-10-CM | POA: Insufficient documentation

## 2021-09-06 DIAGNOSIS — M79604 Pain in right leg: Secondary | ICD-10-CM | POA: Insufficient documentation

## 2021-09-06 NOTE — Therapy (Signed)
OUTPATIENT PHYSICAL THERAPY THORACOLUMBAR EVALUATION   Patient Name: Barbara [name] MRN: [id number] DOB:1950-10-30, 71 y.o., female Today's Date: 09/07/2021   PT End of Session - 09/06/21 1554     Visit Number 1    Number of Visits 12    Date for PT Re-Evaluation 10/18/21    Authorization Type [employer]    PT Start Time 1445    PT Stop Time 1537    PT Time Calculation (min) 52 min    Activity Tolerance Patient tolerated treatment well    Behavior During Therapy [facility] for tasks assessed/performed             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, nickel  03/31/2017   Anxiety    Arthritis    knees   Cancer (HCC)    basal cell- nose   GERD (gastroesophageal reflux disease)    Hyperlipidemia    Hypertension    Meningioma (HCC)    Neuromuscular disorder (HCC)    benign tremor- takes Propanolol   PONV (postoperative nausea and vomiting)    "BP DROPS ALSO"   Trigeminal neuralgia of right side of face    Past Surgical History:  Procedure Laterality Date   48 HOUR [place] STUDY N/A 04/17/2016   Procedure: [place] STUDY;  Surgeon: Manus Gunning, MD;  [facility];  Service: Gastroenterology;  Laterality: N/A;   ESOPHAGEAL MANOMETRY N/A 04/17/2016   Procedure: ESOPHAGEAL MANOMETRY (EM);  Surgeon: Manus Gunning, MD;  [facility];  Service: Gastroenterology;  Laterality: N/A;   EXTERNAL FIXATION LEG Left 03/28/2017   [place]   EXTERNAL FIXATION LEG Left 03/28/2017   Procedure: EXTERNAL FIXATION LEG;  Surgeon: Renette Butters, MD;  Location: [place];  Service: Orthopedics;  Laterality: Left;   EXTERNAL FIXATION REMOVAL Left 05/08/2017   Procedure: REMOVAL EXTERNAL FIXATION LEFT  LEG WITH HARDWARE REMOVAL;  Surgeon: Renette Butters, MD;  Location: [place];  Service: Orthopedics;  Laterality: Left;   fractured femur  05/2012   left - rod -screws placed   HARDWARE REMOVAL Left 04/16/2013   Procedure: REMOVAL OF  HARDWARE OF LEFT KNEE (DISTAL INTERLOC SCREW);  Surgeon: Gearlean Alf, MD;  [facility];  Service: Orthopedics;  Laterality: Left;   KNEE SURGERY     x 3 left / x1 rt knee   LEFT HEART CATH AND CORONARY ANGIOGRAPHY N/A 09/18/2016   Procedure: Left Heart Cath and Coronary Angiography;  Surgeon: Sherren Mocha, MD;  Location: [place] [facility];  Service: Cardiovascular;  Laterality: N/A;   NASAL SINUS SURGERY     2000   OTHER SURGICAL HISTORY  2014   titanium rod in left femur used for fracture repair   SHOULDER SURGERY     bilateral shoulders    WISDOM TOOTH EXTRACTION     Patient Active Problem List   Diagnosis Date Noted   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, nickel  03/31/2017   Closed left tibial fracture 03/28/2017   Closed fracture of left tibial plateau 03/28/2017   Nonintractable headache 01/25/2017   Visual field defect    Chest pain 09/16/2016   Essential tremor 09/16/2016   Right facial numbness 09/16/2016   Bilateral scleritis 08/29/2016   Obesity 06/01/2016   Dyspnea on exertion 05/13/2016   Hoarseness 03/17/2016   Gastroesophageal reflux disease without esophagitis 03/17/2016   Globus sensation 03/17/2016   Gastritis 02/25/2016   Atypical chest pain 02/24/2016   Painful orthopaedic hardware (  [place]) 04/15/2013   Vitamin D deficiency 12/09/2009   HYPERCHOLESTEROLEMIA 12/09/2009   Essential hypertension 12/09/2009   Allergic rhinitis 12/09/2009   NEPHROLITHIASIS 12/09/2009   CERVICAL POLYP 12/09/2009   OSTEOARTHRITIS 12/09/2009   Cough variant asthma vs UACS  12/09/2009    PCP: Jonathon Jordan, MD  REFERRING PROVIDER: Renette Butters, MD   REFERRING DIAG: M54.50 (ICD-10-CM) - Lumbago   Rationale for Evaluation and Treatment Rehabilitation  THERAPY DIAG:  Pain in right leg  Pain in right hip  Stiffness of right knee, not elsewhere classified  Muscle weakness (generalized)  Difficulty in walking, not elsewhere classified  Pain in left hip  ONSET DATE: Early  to mid May  SUBJECTIVE:                                                                                                                                                                                           SUBJECTIVE STATEMENT: Pt is a member at [employer].  Pt states she popped her baker's cyst in R knee during an aquatics exercise class 6-8 weeks ago. Pt saw Dr. Percell Miller who thinks her pain may be coming from her lumbar.  Pt's primary pain is in R posterior thigh and post knee.  Pt had x rays of lumbar which pt states showed bone spurs  Pt reports she has swelling in R knee and thigh and her PCP ordered an [place] to check for blood clots.  [place] was negative for blood clots though pt states she has a small baker's cyst.    Pain with sitting with knee flexed.  Feels worse with ice; feels a little better with heat but not significantly better.  Pt is limited and painful with ambulation and grocery shopping.  Pt doesn't trust her leg and holds onto something for support.  Moves slowly and is limited with taking care of grandchildren.  Pt has difficulty with household chores and has to take multiple rest breaks.  Limited with driving and standing duration.    PERTINENT HISTORY:  PMHx:  Hx of L femur and tibia fractures with multiple surgeries including hardware removal ; Allergy to nickel and autoimmune disorder, osteoporosis though Pt states it has improved and may be osteopenia ; OA in knees, cervical pain, , bilat shoulder surgeries,  R achilles pain due to a reaction from statins  PAIN:  Are you having pain? Yes:  3/10 current, 8-9/10 worst, 1-2/10 best Pt denies lumbar pain and states she has some soreness in bilat post glutes and SI areas.  She states her pain is in R posterior thigh and worse in distal thigh post to knee.  Pain travels from post hip to post knee and can travel to ankle.    PRECAUTIONS: Other: Hx of L LE fractures/surgeries, allergy to nickel, osteoporosis  WEIGHT BEARING  RESTRICTIONS No  FALLS:  Has patient fallen in last 6 months? No   PLOF: Independent Pt was riding schwinn bike at home for 3 miles 5-6 days/wk prior to statin reaction.  Pt was able to ambulate and perform her normal functional mobility skills with much less pain and limitation.    PATIENT GOALS to improve pain and return to PLOF   OBJECTIVE:   DIAGNOSTIC FINDINGS:  PT unable to view pt's x rays.  Pt states lumbar x rays showed bone spurs.  PATIENT SURVEYS:  FOTO 39 with a goal of 56 at visit #12  COGNITION:  Overall cognitive status: Within functional limits for tasks assessed     SENSATION: Will check next visit   LUMBAR ROM:   Active  A/PROM  eval  Flexion WFL  Extension 50%  Right lateral flexion 25%  Left lateral flexion 40%  Right rotation 75%  Left rotation WFL   (Blank rows = not tested)  LOWER EXTREMITY ROM:     AROM/PROM Right eval Left eval  Hip flexion    Hip extension    Hip abduction    Hip adduction    Hip internal rotation    Hip external rotation    Knee flexion 116 120  Knee extension 23/16  Lacking 24 deg with LAQ 5  Lacking 12 deg with LAQ  Ankle dorsiflexion    Ankle plantarflexion    Ankle inversion    Ankle eversion     (Blank rows = not tested)  LOWER EXTREMITY MMT:    MMT Right eval Left eval  Hip flexion 5/5 5/5  Hip extension    Hip abduction 4/5 4+/5  Hip adduction    Hip internal rotation    Hip external rotation 5/5 4+/5  Knee flexion 4+/5 seated  5/5 seated  Knee extension 5/5 w/n available ROM 5/5 w/n available ROM  Ankle dorsiflexion    Ankle plantarflexion    Ankle inversion    Ankle eversion     (Blank rows = not tested)  LUMBAR SPECIAL TESTS:  Supine SLR test:  L:  negative, R:  unable to assess Unable to straighten L knee, difficult to perform HS stretch and supine SLR   GAIT: Assistive device utilized: None Comments: significantly limited TKE in bilat LE's, decreased step length bilat, bilat  genu valgum, decreased gait speed.     TODAY'S TREATMENT  See below for pt education   PATIENT EDUCATION:  Education details: objective findings, dx, prognosis, POC, rationale of exercises, and relevant anatomy.   Person educated: Patient Education method: Explanation Education comprehension: verbalized understanding   HOME EXERCISE PROGRAM: Will give next visit  ASSESSMENT:  CLINICAL IMPRESSION: Patient is a 71 y.o. female with a dx of lumbago presenting to the clinic with R LE pain, bilat hip pain, stiffness in R knee, muscle weakness, and difficulty in walking.  Pt denies lumbar pain and states she has some soreness in bilat post glutes and SI areas.  She states her pain is in R posterior thigh and worse in distal thigh post to knee.  Pt has significant limitations in R knee extension ROM.  Pt has pain and limitations with her normal functional mobility skills including ambulation, standing, and stairs.  Pt is limited with ambulation distance and grocery shopping.   Pt has  difficulty with household chores and has to take multiple rest breaks.  She is limited with taking care of grandchildren.  Pt should benefit from skilled PT services to address impairments and improve overall function.    OBJECTIVE IMPAIRMENTS Abnormal gait, decreased activity tolerance, decreased balance, decreased endurance, decreased mobility, difficulty walking, decreased ROM, decreased strength, hypomobility, impaired flexibility, postural dysfunction, and pain.   ACTIVITY LIMITATIONS bending, sitting, standing, squatting, stairs, and locomotion level  PARTICIPATION LIMITATIONS: cleaning, driving, and shopping and taking care of grandchildren  PERSONAL FACTORS 3+ comorbidities: Hx of L femur and tibia fractures with multiple surgeries, autoimmune disorder, osteoporosis though Pt states it has improved and may be osteopenia ; OA in knees, R achilles pain  are also affecting patient's functional outcome.    REHAB POTENTIAL: Good  CLINICAL DECISION MAKING: Evolving/moderate complexity  EVALUATION COMPLEXITY: Moderate   GOALS:   SHORT TERM GOALS: Target date: 09/27/2021  Pt will be independent and compliant with HEP for improved pain, ROM, strength, and function.  Baseline: Goal status: INITIAL  2.  Pt will demo improved gait speed and reduced limp.  Baseline:  Goal status: INITIAL  3.  Pt will report improved confidence in stability with daily mobility.   Baseline:  Goal status: INITIAL  4.  Pt will report at least a 25% improvement in pain and sx's overall. Baseline:  Goal status: INITIAL  5.  Pt will demo improved L knee extension AROM to 13 deg for improved stiffness and gait.  Baseline:  Goal status: INITIAL   LONG TERM GOALS: Target date: 10/18/2021   Pt will be able to perform her normal community ambulation without significant difficulty and pain Baseline:  Goal status: INITIAL  2.  Pt will be able to perform grocery shopping without significant pain. Baseline:  Goal status: INITIAL  3.  Pt will demo improved L knee extension AROM to no > than 3 deg for improved stiffness and gait.  Baseline:  Goal status: INITIAL  4.  Pt will ambulate without favoring R LE with no > than minimally limited TKE for improved quality of gait. Baseline:  Goal status: INITIAL  5.  Pt will be able to take care of grandchildren without adverse effects. Baseline:  Goal status: INITIAL    PLAN: PT FREQUENCY: 2x/week  PT DURATION: 6 weeks  PLANNED INTERVENTIONS: Therapeutic exercises, Therapeutic activity, Neuromuscular re-education, Balance training, Gait training, Patient/Family education, Stair training, Aquatic Therapy, Cryotherapy, Moist heat, Taping, Manual therapy, and Re-evaluation.  PLAN FOR NEXT SESSION: Review current home program.  Assess sensation to LT.  Knee extension stretching, calf stretching.  Quad set with heel propped.  Establish HEP.    Selinda Michaels  III PT, DPT 09/07/21 9:10 PM

## 2021-09-07 ENCOUNTER — Encounter (HOSPITAL_BASED_OUTPATIENT_CLINIC_OR_DEPARTMENT_OTHER): Payer: Self-pay | Admitting: Physical Therapy

## 2021-09-08 ENCOUNTER — Encounter (HOSPITAL_BASED_OUTPATIENT_CLINIC_OR_DEPARTMENT_OTHER): Payer: Self-pay | Admitting: Physical Therapy

## 2021-09-08 ENCOUNTER — Ambulatory Visit (HOSPITAL_BASED_OUTPATIENT_CLINIC_OR_DEPARTMENT_OTHER): Payer: Medicare PPO | Admitting: Physical Therapy

## 2021-09-08 DIAGNOSIS — M25661 Stiffness of right knee, not elsewhere classified: Secondary | ICD-10-CM

## 2021-09-08 DIAGNOSIS — M79604 Pain in right leg: Secondary | ICD-10-CM

## 2021-09-08 DIAGNOSIS — M25551 Pain in right hip: Secondary | ICD-10-CM

## 2021-09-08 DIAGNOSIS — M25552 Pain in left hip: Secondary | ICD-10-CM | POA: Diagnosis not present

## 2021-09-08 DIAGNOSIS — M6281 Muscle weakness (generalized): Secondary | ICD-10-CM

## 2021-09-08 DIAGNOSIS — R262 Difficulty in walking, not elsewhere classified: Secondary | ICD-10-CM

## 2021-09-08 NOTE — Therapy (Signed)
OUTPATIENT PHYSICAL THERAPY TREATMENT NOTE   Patient Name: Barbara [name] MRN: [id number] DOB:02-27-1951, 71 y.o., female Today's Date: 09/09/2021    END OF SESSION:   PT End of Session - 09/08/21 1000     Visit Number 2    Number of Visits 12    Date for PT Re-Evaluation 10/18/21    Authorization Type [employer]    PT Start Time [phone]    PT Stop Time 1019    PT Time Calculation (min) 41 min    Activity Tolerance Patient tolerated treatment well    Behavior During Therapy [facility] for tasks assessed/performed             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, nickel  03/31/2017   Anxiety    Arthritis    knees   Cancer (HCC)    basal cell- nose   GERD (gastroesophageal reflux disease)    Hyperlipidemia    Hypertension    Meningioma (HCC)    Neuromuscular disorder (HCC)    benign tremor- takes Propanolol   PONV (postoperative nausea and vomiting)    "BP DROPS ALSO"   Trigeminal neuralgia of right side of face    Past Surgical History:  Procedure Laterality Date   19 HOUR [place] STUDY N/A 04/17/2016   Procedure: [place] STUDY;  Surgeon: Manus Gunning, MD;  [facility];  Service: Gastroenterology;  Laterality: N/A;   ESOPHAGEAL MANOMETRY N/A 04/17/2016   Procedure: ESOPHAGEAL MANOMETRY (EM);  Surgeon: Manus Gunning, MD;  [facility];  Service: Gastroenterology;  Laterality: N/A;   EXTERNAL FIXATION LEG Left 03/28/2017   [place]   EXTERNAL FIXATION LEG Left 03/28/2017   Procedure: EXTERNAL FIXATION LEG;  Surgeon: Renette Butters, MD;  Location: [place];  Service: Orthopedics;  Laterality: Left;   EXTERNAL FIXATION REMOVAL Left 05/08/2017   Procedure: REMOVAL EXTERNAL FIXATION LEFT  LEG WITH HARDWARE REMOVAL;  Surgeon: Renette Butters, MD;  Location: [place];  Service: Orthopedics;  Laterality: Left;   fractured femur  05/2012   left - rod -screws placed   HARDWARE REMOVAL Left 04/16/2013   Procedure:  REMOVAL OF HARDWARE OF LEFT KNEE (DISTAL INTERLOC SCREW);  Surgeon: Gearlean Alf, MD;  [facility];  Service: Orthopedics;  Laterality: Left;   KNEE SURGERY     x 3 left / x1 rt knee   LEFT HEART CATH AND CORONARY ANGIOGRAPHY N/A 09/18/2016   Procedure: Left Heart Cath and Coronary Angiography;  Surgeon: Sherren Mocha, MD;  Location: [place] [facility];  Service: Cardiovascular;  Laterality: N/A;   NASAL SINUS SURGERY     2000   OTHER SURGICAL HISTORY  2014   titanium rod in left femur used for fracture repair   SHOULDER SURGERY     bilateral shoulders    WISDOM TOOTH EXTRACTION     Patient Active Problem List   Diagnosis Date Noted   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, nickel  03/31/2017   Closed left tibial fracture 03/28/2017   Closed fracture of left tibial plateau 03/28/2017   Nonintractable headache 01/25/2017   Visual field defect    Chest pain 09/16/2016   Essential tremor 09/16/2016   Right facial numbness 09/16/2016   Bilateral scleritis 08/29/2016   Obesity 06/01/2016   Dyspnea on exertion 05/13/2016   Hoarseness 03/17/2016   Gastroesophageal reflux disease without esophagitis 03/17/2016   Globus sensation 03/17/2016   Gastritis 02/25/2016   Atypical chest pain  02/24/2016   Painful orthopaedic hardware ([place]) 04/15/2013   Vitamin D deficiency 12/09/2009   HYPERCHOLESTEROLEMIA 12/09/2009   Essential hypertension 12/09/2009   Allergic rhinitis 12/09/2009   NEPHROLITHIASIS 12/09/2009   CERVICAL POLYP 12/09/2009   OSTEOARTHRITIS 12/09/2009   Cough variant asthma vs UACS  12/09/2009    PCP: Jonathon Jordan, MD   REFERRING PROVIDER: Renette Butters, MD    REFERRING DIAG: M54.50 (ICD-10-CM) - Lumbago    Rationale for Evaluation and Treatment Rehabilitation   THERAPY DIAG:  Pain in right leg   Pain in right hip   Stiffness of right knee, not elsewhere classified   Muscle weakness (generalized)   Difficulty in walking, not elsewhere classified   Pain in left  hip   ONSET DATE: Early to mid May   SUBJECTIVE:                                                                                                                                                                                            SUBJECTIVE STATEMENT: Pt ambulated into facility with [facility] and knee brace.  She states she brought her cane in today due to not knowing how far she would have to walk from parking lot.  Pt reports having increased soreness after after prior Rx.  Pain with sitting with knee flexed.  Feels worse with ice; feels a little better with heat but not significantly better.  Pt is limited and painful with ambulation and grocery shopping.  Pt has difficulty with walking parking lot to grocery store.  Pt doesn't trust her leg and holds onto something for support.  Moves slowly and is limited with taking care of grandchildren.  Pt has difficulty with household chores and has to take multiple rest breaks.  Limited with driving and standing duration.   Pt states she started performing her prior home program more consistently last week.       PERTINENT HISTORY:  PMHx:  Hx of L femur and tibia fractures with multiple surgeries including hardware removal ; Allergy to nickel and autoimmune disorder, osteoporosis though Pt states it has improved and may be osteopenia ; OA in knees, cervical pain, , bilat shoulder surgeries,  R achilles pain due to a reaction from statins   PAIN:  Are you having pain? Yes:  5-6/10 current, 8-9/10 worst, 1-2/10 best Location:  post knee, distal thigh  Pt denies lumbar pain and states she has some soreness in bilat post glutes and SI areas.  She states her pain is in R posterior thigh and worse in distal thigh post to knee.  Pain travels from post hip to post  knee and can travel to ankle.      PRECAUTIONS: Other: Hx of L LE fractures/surgeries, allergy to nickel, osteoporosis   WEIGHT BEARING RESTRICTIONS No   FALLS:  Has patient fallen in last 6  months? No     PLOF: Independent Pt was riding schwinn bike at home for 3 miles 5-6 days/wk prior to statin reaction.  Pt was able to ambulate and perform her normal functional mobility skills with much less pain and limitation.     PATIENT GOALS to improve pain and return to PLOF     OBJECTIVE:    DIAGNOSTIC FINDINGS:  PT unable to view pt's x rays.  Pt states lumbar x rays showed bone spurs.       TODAY'S TREATMENT  -Reviewed current function, response to prior Rx, and pain level. -Established HEP and instructed pt in HEP.   -Pt performed:   Longsitting quad set with LE prop 2x5 reps with 5 sec hold   Longsitting gastroc stretch 3x20-30 sec   Seated HS stretch 2x20-30 sec bilat   S/L hip abduction x8 and 9 reps on R and 2x10 on L  -PT attempted gentle manual knee extension stretch though stopped due to pain.    -Reviewed current home program:   Supine marching with PPT x 10 reps   Supine SLR with PPT x 10 reps   Lumbar rotation x 10 reps    Pt also performs semi longsitting HS stretch and PF with T band with eccentric focus.     Standing heel raises is on her program but she has not been performing.     PATIENT EDUCATION:  Education details: objective findings, dx, prognosis, POC, rationale of exercises, and relevant anatomy.   Person educated: Patient Education method: Explanation Education comprehension: verbalized understanding     HOME EXERCISE PRAccess Code: [email or web address] URL: [email or web address] Date: 09/08/2021 Prepared by: Ronny Flurry  Exercises - Long Sitting Quad Set with Towel Roll Under Heel  - 2 x daily - 7 x weekly - 2 sets - 5 reps - 5 seconds hold - Long Sitting Calf Stretch with Strap  - 2 x daily - 7 x weekly - 2 reps - 20-30 second hold - Seated Hamstring Stretch  - 2 x daily - 7 x weekly - 2 reps - 20-30 second holdOGRAM:    ASSESSMENT:   CLINICAL IMPRESSION: Pt is significantly limited with R knee extension ROM.  PT established  HEP and she performed exercises well with instruction.  Pt felt better after self stretching and states that her knee is straighter.  Pt did not tolerate gentle extension stretching by PT due to pain.  PT stopped manual stretching due to pain.  PT reviewed her current exercise program which involves some core work and calf strengthening with eccentric focus.  Pt responded well to Rx stating she felt better after Rx.  Pt should benefit from skilled PT services to address impairments and improve overall function.      OBJECTIVE IMPAIRMENTS Abnormal gait, decreased activity tolerance, decreased balance, decreased endurance, decreased mobility, difficulty walking, decreased ROM, decreased strength, hypomobility, impaired flexibility, postural dysfunction, and pain.    ACTIVITY LIMITATIONS bending, sitting, standing, squatting, stairs, and locomotion level   PARTICIPATION LIMITATIONS: cleaning, driving, and shopping and taking care of grandchildren   PERSONAL FACTORS 3+ comorbidities: Hx of L femur and tibia fractures with multiple surgeries, autoimmune disorder, osteoporosis though Pt states it has improved and may be osteopenia ; OA in knees, R achilles  pain  are also affecting patient's functional outcome.    REHAB POTENTIAL: Good   CLINICAL DECISION MAKING: Evolving/moderate complexity   EVALUATION COMPLEXITY: Moderate     GOALS:     SHORT TERM GOALS: Target date: 09/27/2021   Pt will be independent and compliant with HEP for improved pain, ROM, strength, and function.  Baseline: Goal status: INITIAL   2.  Pt will demo improved gait speed and reduced limp.  Baseline:  Goal status: INITIAL   3.  Pt will report improved confidence in stability with daily mobility.   Baseline:  Goal status: INITIAL   4.  Pt will report at least a 25% improvement in pain and sx's overall. Baseline:  Goal status: INITIAL   5.  Pt will demo improved L knee extension AROM to 13 deg for improved  stiffness and gait.  Baseline:  Goal status: INITIAL     LONG TERM GOALS: Target date: 10/18/2021     Pt will be able to perform her normal community ambulation without significant difficulty and pain Baseline:  Goal status: INITIAL   2.  Pt will be able to perform grocery shopping without significant pain. Baseline:  Goal status: INITIAL   3.  Pt will demo improved L knee extension AROM to no > than 3 deg for improved stiffness and gait.  Baseline:  Goal status: INITIAL   4.  Pt will ambulate without favoring R LE with no > than minimally limited TKE for improved quality of gait. Baseline:  Goal status: INITIAL   5.  Pt will be able to take care of grandchildren without adverse effects. Baseline:  Goal status: INITIAL       PLAN: PT FREQUENCY: 2x/week   PT DURATION: 6 weeks   PLANNED INTERVENTIONS: Therapeutic exercises, Therapeutic activity, Neuromuscular re-education, Balance training, Gait training, Patient/Family education, Stair training, Aquatic Therapy, Cryotherapy, Moist heat, Taping, Manual therapy, and Re-evaluation.   PLAN FOR NEXT SESSION:  Knee extension stretching, calf stretching.  Review HEP.  Cont with core/hip strengthening.     Selinda Michaels III PT, DPT 09/09/21 6:52 AM

## 2021-09-20 ENCOUNTER — Encounter (HOSPITAL_BASED_OUTPATIENT_CLINIC_OR_DEPARTMENT_OTHER): Payer: Medicare PPO | Admitting: Physical Therapy

## 2021-09-21 ENCOUNTER — Ambulatory Visit (HOSPITAL_BASED_OUTPATIENT_CLINIC_OR_DEPARTMENT_OTHER): Payer: Medicare PPO | Attending: Orthopedic Surgery | Admitting: Physical Therapy

## 2021-09-21 DIAGNOSIS — M25552 Pain in left hip: Secondary | ICD-10-CM | POA: Insufficient documentation

## 2021-09-21 DIAGNOSIS — M79604 Pain in right leg: Secondary | ICD-10-CM | POA: Insufficient documentation

## 2021-09-21 DIAGNOSIS — R262 Difficulty in walking, not elsewhere classified: Secondary | ICD-10-CM | POA: Diagnosis not present

## 2021-09-21 DIAGNOSIS — M25661 Stiffness of right knee, not elsewhere classified: Secondary | ICD-10-CM | POA: Diagnosis not present

## 2021-09-21 DIAGNOSIS — M25551 Pain in right hip: Secondary | ICD-10-CM | POA: Insufficient documentation

## 2021-09-21 DIAGNOSIS — M6281 Muscle weakness (generalized): Secondary | ICD-10-CM | POA: Diagnosis not present

## 2021-09-21 NOTE — Therapy (Signed)
OUTPATIENT PHYSICAL THERAPY TREATMENT NOTE   Patient Name: Barbara [name] MRN: [id number] DOB:1951/01/08, 71 y.o., female Today's Date: 09/22/2021    END OF SESSION:   PT End of Session - 09/21/21 1604     Visit Number 3    Number of Visits 12    Date for PT Re-Evaluation 10/18/21    Authorization Type [employer]    PT Start Time 1520    PT Stop Time 1603    PT Time Calculation (min) 43 min    Activity Tolerance Patient tolerated treatment well    Behavior During Therapy WFL for tasks assessed/performed              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, nickel  03/31/2017   Anxiety    Arthritis    knees   Cancer (HCC)    basal cell- nose   GERD (gastroesophageal reflux disease)    Hyperlipidemia    Hypertension    Meningioma (HCC)    Neuromuscular disorder (HCC)    benign tremor- takes Propanolol   PONV (postoperative nausea and vomiting)    "BP DROPS ALSO"   Trigeminal neuralgia of right side of face    Past Surgical History:  Procedure Laterality Date   50 HOUR [place] STUDY N/A 04/17/2016   Procedure: [place] STUDY;  Surgeon: Manus Gunning, MD;  [facility];  Service: Gastroenterology;  Laterality: N/A;   ESOPHAGEAL MANOMETRY N/A 04/17/2016   Procedure: ESOPHAGEAL MANOMETRY (EM);  Surgeon: Manus Gunning, MD;  [facility];  Service: Gastroenterology;  Laterality: N/A;   EXTERNAL FIXATION LEG Left 03/28/2017   [place]   EXTERNAL FIXATION LEG Left 03/28/2017   Procedure: EXTERNAL FIXATION LEG;  Surgeon: Renette Butters, MD;  Location: [facility];  Service: Orthopedics;  Laterality: Left;   EXTERNAL FIXATION REMOVAL Left 05/08/2017   Procedure: REMOVAL EXTERNAL FIXATION LEFT  LEG WITH HARDWARE REMOVAL;  Surgeon: Renette Butters, MD;  Location: [place];  Service: Orthopedics;  Laterality: Left;   fractured femur  05/2012   left - rod -screws placed   HARDWARE REMOVAL Left 04/16/2013   Procedure:  REMOVAL OF HARDWARE OF LEFT KNEE (DISTAL INTERLOC SCREW);  Surgeon: Gearlean Alf, MD;  [facility];  Service: Orthopedics;  Laterality: Left;   KNEE SURGERY     x 3 left / x1 rt knee   LEFT HEART CATH AND CORONARY ANGIOGRAPHY N/A 09/18/2016   Procedure: Left Heart Cath and Coronary Angiography;  Surgeon: Sherren Mocha, MD;  Location: [facility];  Service: Cardiovascular;  Laterality: N/A;   NASAL SINUS SURGERY     2000   OTHER SURGICAL HISTORY  2014   titanium rod in left femur used for fracture repair   SHOULDER SURGERY     bilateral shoulders    WISDOM TOOTH EXTRACTION     Patient Active Problem List   Diagnosis Date Noted   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, nickel  03/31/2017   Closed left tibial fracture 03/28/2017   Closed fracture of left tibial plateau 03/28/2017   Nonintractable headache 01/25/2017   Visual field defect    Chest pain 09/16/2016   Essential tremor 09/16/2016   Right facial numbness 09/16/2016   Bilateral scleritis 08/29/2016   Obesity 06/01/2016   Dyspnea on exertion 05/13/2016   Hoarseness 03/17/2016   Gastroesophageal reflux disease without esophagitis 03/17/2016   Globus sensation 03/17/2016   Gastritis 02/25/2016   Atypical chest  pain 02/24/2016   Painful orthopaedic hardware ([place]) 04/15/2013   Vitamin D deficiency 12/09/2009   HYPERCHOLESTEROLEMIA 12/09/2009   Essential hypertension 12/09/2009   Allergic rhinitis 12/09/2009   NEPHROLITHIASIS 12/09/2009   CERVICAL POLYP 12/09/2009   OSTEOARTHRITIS 12/09/2009   Cough variant asthma vs UACS  12/09/2009    PCP: Jonathon Jordan, MD   REFERRING PROVIDER: Renette Butters, MD    REFERRING DIAG: M54.50 (ICD-10-CM) - Lumbago    Rationale for Evaluation and Treatment Rehabilitation   THERAPY DIAG:  Pain in right leg   Pain in right hip   Stiffness of right knee, not elsewhere classified   Muscle weakness (generalized)   Difficulty in walking, not elsewhere classified   Pain in left  hip   ONSET DATE: Early to mid May   SUBJECTIVE:                                                                                                                                                                                            SUBJECTIVE STATEMENT: Pt ambulated into facility with [facility] and knee brace.  Pt is limited and painful with ambulation and grocery shopping.  Pt has difficulty with walking parking lot to grocery store.  Pt doesn't trust her leg and holds onto something for support.    Pt states it's getting worse. Her pain is more constant.  Pt states she can't ease her pain.  Pt states her walking is worse.  Her knee still swells in the evening.  She has pain in the entire R LE.  Pt feels that her R LE is going to "cave in" and not support her.  Pt called MD office last week and she has a MRI scheduled for tomorrow.   Pt reports compliance with HEP.  Pt states her knee gets straighter with the exercises though her knee tightens back up after sitting.      PERTINENT HISTORY:  PMHx:  Hx of L femur and tibia fractures with multiple surgeries including hardware removal ; Allergy to nickel and autoimmune disorder, osteoporosis though Pt states it has improved and may be osteopenia ; OA in knees, cervical pain, , bilat shoulder surgeries,  R achilles pain due to a reaction from statins   PAIN:  Are you having pain? Yes:  3/10 current, 8-9/10 worst, 1-2/10 best Location:  post knee, post thigh  Pt denies lumbar pain and states she has some soreness in bilat post glutes and SI areas.  She states her pain is in R posterior thigh and worse in distal thigh post to knee.  Pain travels from post hip to post knee  and can travel to ankle.      PRECAUTIONS: Other: Hx of L LE fractures/surgeries, allergy to nickel, osteoporosis   WEIGHT BEARING RESTRICTIONS No   FALLS:  Has patient fallen in last 6 months? No     PLOF: Independent Pt was riding schwinn bike at home for 3 miles 5-6 days/wk  prior to statin reaction.  Pt was able to ambulate and perform her normal functional mobility skills with much less pain and limitation.     PATIENT GOALS to improve pain and return to PLOF     OBJECTIVE:    DIAGNOSTIC FINDINGS:  PT unable to view pt's x rays.  Pt states lumbar x rays showed bone spurs.       TODAY'S TREATMENT  -Reviewed current function, response to prior Rx, and pain level. -Reviewed HEP.   -Pt performed:   Supine marching with PPT x 10 reps   Supine SLR with PPT x 10 reps on L,  Stopped on R LE due to post LE pain   Clams with GTB 3x10 reps Longsitting quad set with LE prop 3x5 reps with 5 sec hold   Longsitting gastroc stretch 3x30 sec   S/L hip abduction x8 and 9 reps on R and 2x10 on L  -PT performed gentle manual knee extension stretch with heel propped in therapist's hand.  -PT spent time educating pt concerning dx, home management strategies, sx's, rationale of exercises, and response to exercises.   Manual Therapy:  STM to R HS and R ITB and also rolling to HS in L S/L'ing with pillow b/w knees to improve tightness, pain, and mobility.    PATIENT EDUCATION:  Education details:  PT answered pt's questions.  dx, prognosis, POC, rationale of exercises, and relevant anatomy.   Person educated: Patient Education method: Explanation Education comprehension: verbalized understanding     HOME EXERCISE PRAccess Code: [email or web address] URL: [email or web address] Date: 09/08/2021 Prepared by: Ronny Flurry  Exercises - Long Sitting Quad Set with Towel Roll Under Heel  - 2 x daily - 7 x weekly - 2 sets - 5 reps - 5 seconds hold - Long Sitting Calf Stretch with Strap  - 2 x daily - 7 x weekly - 2 reps - 20-30 second hold - Seated Hamstring Stretch  - 2 x daily - 7 x weekly - 2 reps - 20-30 second hold    ASSESSMENT:   CLINICAL IMPRESSION: Pt presents to Rx reporting increased pain and worse gait.  She states her pain is more constant.  Pt has spoken  with MD and is having a MRI tomorrow.  Pt is ambulating with knee brace and SPC.  Her gait is worse today being slower and having increased antalgic limp.  Pt had pain with knee extension stretching though was able to tolerate stretching today.  Pt had improved tolerance with knee extension ROM today.  She has significant soft tissue tightness in R ITB and is very tender to palpate R ITB.  Pt responded well to Rx stating she felt much better after Rx.  She reports no pain in R LE sitting at EOT and also had improved performance of sit to stand transfer.  She had improved hesitancy with standing up and initiating ambulation after Rx.       OBJECTIVE IMPAIRMENTS Abnormal gait, decreased activity tolerance, decreased balance, decreased endurance, decreased mobility, difficulty walking, decreased ROM, decreased strength, hypomobility, impaired flexibility, postural dysfunction, and pain.    ACTIVITY LIMITATIONS bending, sitting, standing, squatting, stairs, and  locomotion level   PARTICIPATION LIMITATIONS: cleaning, driving, and shopping and taking care of grandchildren   PERSONAL FACTORS 3+ comorbidities: Hx of L femur and tibia fractures with multiple surgeries, autoimmune disorder, osteoporosis though Pt states it has improved and may be osteopenia ; OA in knees, R achilles pain  are also affecting patient's functional outcome.    REHAB POTENTIAL: Good   CLINICAL DECISION MAKING: Evolving/moderate complexity   EVALUATION COMPLEXITY: Moderate     GOALS:     SHORT TERM GOALS: Target date: 09/27/2021   Pt will be independent and compliant with HEP for improved pain, ROM, strength, and function.  Baseline: Goal status: INITIAL   2.  Pt will demo improved gait speed and reduced limp.  Baseline:  Goal status: INITIAL   3.  Pt will report improved confidence in stability with daily mobility.   Baseline:  Goal status: INITIAL   4.  Pt will report at least a 25% improvement in pain and sx's  overall. Baseline:  Goal status: INITIAL   5.  Pt will demo improved L knee extension AROM to 13 deg for improved stiffness and gait.  Baseline:  Goal status: INITIAL     LONG TERM GOALS: Target date: 10/18/2021     Pt will be able to perform her normal community ambulation without significant difficulty and pain Baseline:  Goal status: INITIAL   2.  Pt will be able to perform grocery shopping without significant pain. Baseline:  Goal status: INITIAL   3.  Pt will demo improved L knee extension AROM to no > than 3 deg for improved stiffness and gait.  Baseline:  Goal status: INITIAL   4.  Pt will ambulate without favoring R LE with no > than minimally limited TKE for improved quality of gait. Baseline:  Goal status: INITIAL   5.  Pt will be able to take care of grandchildren without adverse effects. Baseline:  Goal status: INITIAL       PLAN: PT FREQUENCY: 2x/week   PT DURATION: 6 weeks   PLANNED INTERVENTIONS: Therapeutic exercises, Therapeutic activity, Neuromuscular re-education, Balance training, Gait training, Patient/Family education, Stair training, Aquatic Therapy, Cryotherapy, Moist heat, Taping, Manual therapy, and Re-evaluation.   PLAN FOR NEXT SESSION:  Knee extension stretching, calf stretching.  Cont with core/hip strengthening.  STM to ITB and HS.     Selinda Michaels III PT, DPT 09/22/21 4:59 PM

## 2021-09-22 ENCOUNTER — Encounter (HOSPITAL_BASED_OUTPATIENT_CLINIC_OR_DEPARTMENT_OTHER): Payer: Self-pay | Admitting: Physical Therapy

## 2021-09-22 DIAGNOSIS — M25561 Pain in right knee: Secondary | ICD-10-CM | POA: Diagnosis not present

## 2021-09-22 DIAGNOSIS — M545 Low back pain, unspecified: Secondary | ICD-10-CM | POA: Diagnosis not present

## 2021-09-23 ENCOUNTER — Ambulatory Visit (HOSPITAL_BASED_OUTPATIENT_CLINIC_OR_DEPARTMENT_OTHER): Payer: Medicare PPO | Admitting: Physical Therapy

## 2021-09-23 ENCOUNTER — Encounter (HOSPITAL_BASED_OUTPATIENT_CLINIC_OR_DEPARTMENT_OTHER): Payer: Self-pay | Admitting: Physical Therapy

## 2021-09-23 DIAGNOSIS — M79604 Pain in right leg: Secondary | ICD-10-CM | POA: Diagnosis not present

## 2021-09-23 DIAGNOSIS — M25552 Pain in left hip: Secondary | ICD-10-CM | POA: Diagnosis not present

## 2021-09-23 DIAGNOSIS — R262 Difficulty in walking, not elsewhere classified: Secondary | ICD-10-CM

## 2021-09-23 DIAGNOSIS — M25551 Pain in right hip: Secondary | ICD-10-CM | POA: Diagnosis not present

## 2021-09-23 DIAGNOSIS — M25661 Stiffness of right knee, not elsewhere classified: Secondary | ICD-10-CM | POA: Diagnosis not present

## 2021-09-23 DIAGNOSIS — M6281 Muscle weakness (generalized): Secondary | ICD-10-CM

## 2021-09-23 NOTE — Therapy (Signed)
OUTPATIENT PHYSICAL THERAPY TREATMENT NOTE   Patient Name: Barbara [name] MRN: [id number] DOB:1950/09/21, 70 y.o., female Today's Date: 09/23/2021    END OF SESSION:   PT End of Session - 09/23/21 1114     Visit Number 4    Number of Visits 12    Date for PT Re-Evaluation 10/18/21    Authorization Type [employer]    PT Start Time 1014    PT Stop Time 1059    PT Time Calculation (min) 45 min    Activity Tolerance Patient tolerated treatment well    Behavior During Therapy WFL for tasks assessed/performed               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, nickel  03/31/2017   Anxiety    Arthritis    knees   Cancer (HCC)    basal cell- nose   GERD (gastroesophageal reflux disease)    Hyperlipidemia    Hypertension    Meningioma (HCC)    Neuromuscular disorder (HCC)    benign tremor- takes Propanolol   PONV (postoperative nausea and vomiting)    "BP DROPS ALSO"   Trigeminal neuralgia of right side of face    Past Surgical History:  Procedure Laterality Date   73 HOUR [place] STUDY N/A 04/17/2016   Procedure: [place] STUDY;  Surgeon: Manus Gunning, MD;  [facility];  Service: Gastroenterology;  Laterality: N/A;   ESOPHAGEAL MANOMETRY N/A 04/17/2016   Procedure: ESOPHAGEAL MANOMETRY (EM);  Surgeon: Manus Gunning, MD;  [facility];  Service: Gastroenterology;  Laterality: N/A;   EXTERNAL FIXATION LEG Left 03/28/2017   [place]   EXTERNAL FIXATION LEG Left 03/28/2017   Procedure: EXTERNAL FIXATION LEG;  Surgeon: Renette Butters, MD;  Location: [place];  Service: Orthopedics;  Laterality: Left;   EXTERNAL FIXATION REMOVAL Left 05/08/2017   Procedure: REMOVAL EXTERNAL FIXATION LEFT  LEG WITH HARDWARE REMOVAL;  Surgeon: Renette Butters, MD;  Location: [place];  Service: Orthopedics;  Laterality: Left;   fractured femur  05/2012   left - rod -screws placed   HARDWARE REMOVAL Left 04/16/2013   Procedure:  REMOVAL OF HARDWARE OF LEFT KNEE (DISTAL INTERLOC SCREW);  Surgeon: Gearlean Alf, MD;  [facility];  Service: Orthopedics;  Laterality: Left;   KNEE SURGERY     x 3 left / x1 rt knee   LEFT HEART CATH AND CORONARY ANGIOGRAPHY N/A 09/18/2016   Procedure: Left Heart Cath and Coronary Angiography;  Surgeon: Sherren Mocha, MD;  Location: [facility];  Service: Cardiovascular;  Laterality: N/A;   NASAL SINUS SURGERY     2000   OTHER SURGICAL HISTORY  2014   titanium rod in left femur used for fracture repair   SHOULDER SURGERY     bilateral shoulders    WISDOM TOOTH EXTRACTION     Patient Active Problem List   Diagnosis Date Noted   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, nickel  03/31/2017   Closed left tibial fracture 03/28/2017   Closed fracture of left tibial plateau 03/28/2017   Nonintractable headache 01/25/2017   Visual field defect    Chest pain 09/16/2016   Essential tremor 09/16/2016   Right facial numbness 09/16/2016   Bilateral scleritis 08/29/2016   Obesity 06/01/2016   Dyspnea on exertion 05/13/2016   Hoarseness 03/17/2016   Gastroesophageal reflux disease without esophagitis 03/17/2016   Globus sensation 03/17/2016   Gastritis 02/25/2016   Atypical  chest pain 02/24/2016   Painful orthopaedic hardware ([place]) 04/15/2013   Vitamin D deficiency 12/09/2009   HYPERCHOLESTEROLEMIA 12/09/2009   Essential hypertension 12/09/2009   Allergic rhinitis 12/09/2009   NEPHROLITHIASIS 12/09/2009   CERVICAL POLYP 12/09/2009   OSTEOARTHRITIS 12/09/2009   Cough variant asthma vs UACS  12/09/2009    PCP: Jonathon Jordan, MD   REFERRING PROVIDER: Renette Butters, MD    REFERRING DIAG: M54.50 (ICD-10-CM) - Lumbago    Rationale for Evaluation and Treatment Rehabilitation   THERAPY DIAG:  Pain in right leg   Pain in right hip   Stiffness of right knee, not elsewhere classified   Muscle weakness (generalized)   Difficulty in walking, not elsewhere classified   Pain in left  hip   ONSET DATE: Early to mid May   SUBJECTIVE:                                                                                                                                                                                            SUBJECTIVE STATEMENT: Pt ambulated into facility with [facility] and knee brace.  Pt is limited and painful with ambulation and grocery shopping.  Pt has difficulty with walking parking lot to grocery store.  Pt doesn't trust her leg and holds onto something for support.    Pt states it's getting worse. Her pain is more constant.  Pt states she can't ease her pain.  Pt states her walking is worse.  Her knee still swells in the evening.  She has pain in the entire R LE.  Pt feels that her R LE is going to "cave in" and not support her.  Pt called MD office last week and she has a MRI scheduled for tomorrow.   Pt reports compliance with HEP.  Pt states her knee gets straighter with the exercises though her knee tightens back up after sitting.   Pt had a MRI on her lumbar and her leg yesterday.  Pt states she had significant pain during the MRI and after the MRI.  Pt states she was in constant pain during the MRI. "I can't believe how much my leg hurt in the MRI."  Pt reports her leg woke her up at 6:30 AM with 7/10 pain.  Pt states she felt much better after the STW after prior Rx.  She had improved the rest of the day.  Pt reports her R heel burns with ambulation.       PERTINENT HISTORY:  PMHx:  Hx of L femur and tibia fractures with multiple surgeries including hardware removal ; Allergy to nickel and autoimmune disorder, osteoporosis though  Pt states it has improved and may be osteopenia ; OA in knees, cervical pain, , bilat shoulder surgeries,  R achilles pain due to a reaction from statins   PAIN:  Are you having pain? Yes:  6/10 current, 8-9/10 worst, 1-2/10 best Location:  post knee, proximal calf, and post thigh  Pt denies lumbar pain and states she has some  soreness in bilat post glutes and SI areas.  She states her pain is in R posterior thigh and worse in distal thigh post to knee.  Pain travels from post hip to post knee and can travel to ankle.      PRECAUTIONS: Other: Hx of L LE fractures/surgeries, allergy to nickel, osteoporosis   WEIGHT BEARING RESTRICTIONS No   FALLS:  Has patient fallen in last 6 months? No     PLOF: Independent Pt was riding schwinn bike at home for 3 miles 5-6 days/wk prior to statin reaction.  Pt was able to ambulate and perform her normal functional mobility skills with much less pain and limitation.     PATIENT GOALS to improve pain and return to PLOF     OBJECTIVE:    DIAGNOSTIC FINDINGS:  PT unable to view pt's x rays.  Pt states lumbar x rays showed bone spurs.       TODAY'S TREATMENT  -Reviewed current function, response to prior Rx, and pain level.   -Pt performed:   Nustep at L2 x 3 mins Supine marching with PPT x 20 reps   Clams with GTB 3x10 reps Longsitting quad set with LE prop 2x5 reps with 5 sec hold   S/L hip abduction 2x10 bilat  Pt received manual soleus stretch in long sitting 3x30 sec  -PT performed gentle manual knee extension stretch with heel propped in therapist's hand.     Manual Therapy:  STM to R HS and R ITB and also rolling to HS in L S/L'ing with pillow b/w knees to improve tightness, pain, and mobility.    PATIENT EDUCATION:  Education details:  PT answered pt's questions.  dx, prognosis, POC, rationale of exercises, and relevant anatomy.   Person educated: Patient Education method: Explanation Education comprehension: verbalized understanding     HOME EXERCISE PRAccess Code: [email or web address] URL: [email or web address] Date: 09/08/2021 Prepared by: Ronny Flurry  Exercises - Long Sitting Quad Set with Towel Roll Under Heel  - 2 x daily - 7 x weekly - 2 sets - 5 reps - 5 seconds hold - Long Sitting Calf Stretch with Strap  - 2 x daily - 7 x weekly - 2  reps - 20-30 second hold - Seated Hamstring Stretch  - 2 x daily - 7 x weekly - 2 reps - 20-30 second hold    ASSESSMENT:   CLINICAL IMPRESSION: Pt has had increased pain and increased difficulty with walking.  Pt is slow with gait and demonstrates an antalgic gait.  She responded well to prior Rx stating she felt much better after Rx.  She had a MRI the following day and reports having significant pain with the MRI.  Pt had pain with knee extension stretching and PT was limited with stretching due to pain.  Pt only performed 2 sets of quad sets with heel prop due to pain.  Pt states her knee feels straighter with exercises though continues to have pain with knee extension stretching.  She has significant soft tissue tightness in R ITB and tenderness in R lateral HS and ITB.  Pt had pain with  STM to ITB though pt able to tolerate increased pressure.  Pt responded well to Rx stating she felt much better after Rx.  She reports no pain in R LE including with ambulation after Rx.  Pt has improved gait after Rx.      OBJECTIVE IMPAIRMENTS Abnormal gait, decreased activity tolerance, decreased balance, decreased endurance, decreased mobility, difficulty walking, decreased ROM, decreased strength, hypomobility, impaired flexibility, postural dysfunction, and pain.    ACTIVITY LIMITATIONS bending, sitting, standing, squatting, stairs, and locomotion level   PARTICIPATION LIMITATIONS: cleaning, driving, and shopping and taking care of grandchildren   PERSONAL FACTORS 3+ comorbidities: Hx of L femur and tibia fractures with multiple surgeries, autoimmune disorder, osteoporosis though Pt states it has improved and may be osteopenia ; OA in knees, R achilles pain  are also affecting patient's functional outcome.    REHAB POTENTIAL: Good   CLINICAL DECISION MAKING: Evolving/moderate complexity   EVALUATION COMPLEXITY: Moderate     GOALS:     SHORT TERM GOALS: Target date: 09/27/2021   Pt will be  independent and compliant with HEP for improved pain, ROM, strength, and function.  Baseline: Goal status: INITIAL   2.  Pt will demo improved gait speed and reduced limp.  Baseline:  Goal status: INITIAL   3.  Pt will report improved confidence in stability with daily mobility.   Baseline:  Goal status: INITIAL   4.  Pt will report at least a 25% improvement in pain and sx's overall. Baseline:  Goal status: INITIAL   5.  Pt will demo improved L knee extension AROM to 13 deg for improved stiffness and gait.  Baseline:  Goal status: INITIAL     LONG TERM GOALS: Target date: 10/18/2021     Pt will be able to perform her normal community ambulation without significant difficulty and pain Baseline:  Goal status: INITIAL   2.  Pt will be able to perform grocery shopping without significant pain. Baseline:  Goal status: INITIAL   3.  Pt will demo improved L knee extension AROM to no > than 3 deg for improved stiffness and gait.  Baseline:  Goal status: INITIAL   4.  Pt will ambulate without favoring R LE with no > than minimally limited TKE for improved quality of gait. Baseline:  Goal status: INITIAL   5.  Pt will be able to take care of grandchildren without adverse effects. Baseline:  Goal status: INITIAL       PLAN: PT FREQUENCY: 2x/week   PT DURATION: 6 weeks   PLANNED INTERVENTIONS: Therapeutic exercises, Therapeutic activity, Neuromuscular re-education, Balance training, Gait training, Patient/Family education, Stair training, Aquatic Therapy, Cryotherapy, Moist heat, Taping, Manual therapy, and Re-evaluation.   PLAN FOR NEXT SESSION:  Knee extension stretching, calf stretching.  Cont with core/hip strengthening.  STM to ITB and HS.     Selinda Michaels III PT, DPT 09/23/21 11:56 AM

## 2021-09-26 ENCOUNTER — Encounter (HOSPITAL_BASED_OUTPATIENT_CLINIC_OR_DEPARTMENT_OTHER): Payer: Self-pay | Admitting: Physical Therapy

## 2021-09-26 ENCOUNTER — Ambulatory Visit (HOSPITAL_BASED_OUTPATIENT_CLINIC_OR_DEPARTMENT_OTHER): Payer: Medicare PPO | Admitting: Physical Therapy

## 2021-09-26 DIAGNOSIS — M25552 Pain in left hip: Secondary | ICD-10-CM | POA: Diagnosis not present

## 2021-09-26 DIAGNOSIS — M79604 Pain in right leg: Secondary | ICD-10-CM | POA: Diagnosis not present

## 2021-09-26 DIAGNOSIS — M545 Low back pain, unspecified: Secondary | ICD-10-CM | POA: Diagnosis not present

## 2021-09-26 DIAGNOSIS — M25551 Pain in right hip: Secondary | ICD-10-CM | POA: Diagnosis not present

## 2021-09-26 DIAGNOSIS — M25661 Stiffness of right knee, not elsewhere classified: Secondary | ICD-10-CM

## 2021-09-26 DIAGNOSIS — M6281 Muscle weakness (generalized): Secondary | ICD-10-CM

## 2021-09-26 DIAGNOSIS — R262 Difficulty in walking, not elsewhere classified: Secondary | ICD-10-CM

## 2021-09-26 NOTE — Therapy (Signed)
OUTPATIENT PHYSICAL THERAPY TREATMENT NOTE   Patient Name: Barbara [name] MRN: [id number] DOB:02/20/1951, 71 y.o., female Today's Date: 09/26/2021    END OF SESSION:   PT End of Session - 09/26/21 0933     Visit Number 5    Number of Visits 12    Date for PT Re-Evaluation 10/18/21    Authorization Type [employer]    PT Start Time [phone]    PT Stop Time 1015    PT Time Calculation (min) 44 min    Activity Tolerance Patient tolerated treatment well    Behavior During Therapy [facility] for tasks assessed/performed               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, nickel  03/31/2017   Anxiety    Arthritis    knees   Cancer (HCC)    basal cell- nose   GERD (gastroesophageal reflux disease)    Hyperlipidemia    Hypertension    Meningioma (HCC)    Neuromuscular disorder (HCC)    benign tremor- takes Propanolol   PONV (postoperative nausea and vomiting)    "BP DROPS ALSO"   Trigeminal neuralgia of right side of face    Past Surgical History:  Procedure Laterality Date   63 HOUR [place] STUDY N/A 04/17/2016   Procedure: [place] STUDY;  Surgeon: Manus Gunning, MD;  Location: [facility];  Service: Gastroenterology;  Laterality: N/A;   ESOPHAGEAL MANOMETRY N/A 04/17/2016   Procedure: ESOPHAGEAL MANOMETRY (EM);  Surgeon: Manus Gunning, MD;  Location: [facility];  Service: Gastroenterology;  Laterality: N/A;   EXTERNAL FIXATION LEG Left 03/28/2017   [place]   EXTERNAL FIXATION LEG Left 03/28/2017   Procedure: EXTERNAL FIXATION LEG;  Surgeon: Renette Butters, MD;  Location: [place];  Service: Orthopedics;  Laterality: Left;   EXTERNAL FIXATION REMOVAL Left 05/08/2017   Procedure: REMOVAL EXTERNAL FIXATION LEFT  LEG WITH HARDWARE REMOVAL;  Surgeon: Renette Butters, MD;  Location: [place];  Service: Orthopedics;  Laterality: Left;   fractured femur  05/2012   left - rod -screws placed   HARDWARE REMOVAL Left 04/16/2013   Procedure:  REMOVAL OF HARDWARE OF LEFT KNEE (DISTAL INTERLOC SCREW);  Surgeon: Gearlean Alf, MD;  Location: [facility];  Service: Orthopedics;  Laterality: Left;   KNEE SURGERY     x 3 left / x1 rt knee   LEFT HEART CATH AND CORONARY ANGIOGRAPHY N/A 09/18/2016   Procedure: Left Heart Cath and Coronary Angiography;  Surgeon: Sherren Mocha, MD;  Location: [place] [facility];  Service: Cardiovascular;  Laterality: N/A;   NASAL SINUS SURGERY     2000   OTHER SURGICAL HISTORY  2014   titanium rod in left femur used for fracture repair   SHOULDER SURGERY     bilateral shoulders    WISDOM TOOTH EXTRACTION     Patient Active Problem List   Diagnosis Date Noted   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, nickel  03/31/2017   Closed left tibial fracture 03/28/2017   Closed fracture of left tibial plateau 03/28/2017   Nonintractable headache 01/25/2017   Visual field defect    Chest pain 09/16/2016   Essential tremor 09/16/2016   Right facial numbness 09/16/2016   Bilateral scleritis 08/29/2016   Obesity 06/01/2016   Dyspnea on exertion 05/13/2016   Hoarseness 03/17/2016   Gastroesophageal reflux disease without esophagitis 03/17/2016   Globus sensation 03/17/2016   Gastritis 02/25/2016   Atypical  chest pain 02/24/2016   Painful orthopaedic hardware ([place]) 04/15/2013   Vitamin D deficiency 12/09/2009   HYPERCHOLESTEROLEMIA 12/09/2009   Essential hypertension 12/09/2009   Allergic rhinitis 12/09/2009   NEPHROLITHIASIS 12/09/2009   CERVICAL POLYP 12/09/2009   OSTEOARTHRITIS 12/09/2009   Cough variant asthma vs UACS  12/09/2009    PCP: Jonathon Jordan, MD   REFERRING PROVIDER: Renette Butters, MD    REFERRING DIAG: M54.50 (ICD-10-CM) - Lumbago    Rationale for Evaluation and Treatment Rehabilitation   THERAPY DIAG:  Pain in right leg   Pain in right hip   Stiffness of right knee, not elsewhere classified   Muscle weakness (generalized)   Difficulty in walking, not elsewhere classified   Pain in left  hip   ONSET DATE: Early to mid May   SUBJECTIVE:                                                                                                                                                                                            SUBJECTIVE STATEMENT: Pt states she felt fine after Rx though had increased pain the following day.  Pt reports having bruising in ITB.  Pt states she hurt from heel to hip on Saturday though felt better on Sunday.  Pt had heel and R LE pain when walking in [employer] over the weekend.  Pt is seeing MD today to discuss  MRI results.  Pt is limited and has pain with ambulation and grocery shopping.  Pt reports compliance with HEP.      PERTINENT HISTORY:  PMHx:  Hx of L femur and tibia fractures with multiple surgeries including hardware removal ; Allergy to nickel and autoimmune disorder, osteoporosis though Pt states it has improved and may be osteopenia ; OA in knees, cervical pain, , bilat shoulder surgeries,  R achilles pain due to a reaction from statins   PAIN:  Are you having pain? Yes:  6/10 current, 8-9/10 worst, 1-2/10 best Location:  post knee, proximal calf, and post thigh  Pt denies lumbar pain and states she has some soreness in bilat post glutes and SI areas.  She states her pain is in R posterior thigh and worse in distal thigh post to knee.  Pain travels from post hip to post knee and can travel to ankle.      PRECAUTIONS: Other: Hx of L LE fractures/surgeries, allergy to nickel, osteoporosis   WEIGHT BEARING RESTRICTIONS No   FALLS:  Has patient fallen in last 6 months? No     PLOF: Independent Pt was riding schwinn bike at home for 3 miles 5-6 days/wk prior to  statin reaction.  Pt was able to ambulate and perform her normal functional mobility skills with much less pain and limitation.     PATIENT GOALS to improve pain and return to PLOF     OBJECTIVE:    DIAGNOSTIC FINDINGS:  PT unable to view pt's x rays.  Pt states lumbar x rays  showed bone spurs.       TODAY'S TREATMENT  -OBSERVATION:  Pt has bruising along L ITB.    -Reviewed current function, response to prior Rx, and pain level.   -Pt performed:   Nustep at L2 x 5 mins Supine marching with PPT x 20 reps   Clams with GTB 3x10 reps Longsitting quad set with LE prop 2x5 reps with 5 sec hold   Supine SLR 2x10 on L with PPT   Standing calf stretch at wall 2x20 sec  -Pt received supine HS stretch 3x20-30 sec and manual soleus stretch in long sitting 3x30 sec  -PT performed gentle manual knee extension stretch with heel propped in therapist's hand.     Manual Therapy:  STM to R HS and rolling to HS in L S/L'ing with pillow b/w knees to improve tightness, pain, and mobility.    PATIENT EDUCATION:  Education details:  PT answered pt's questions.  dx, prognosis, POC, rationale of exercises, and relevant anatomy.   Person educated: Patient Education method: Explanation Education comprehension: verbalized understanding     HOME EXERCISE PRAccess Code: [email or web address] URL: [email or web address] Date: 09/08/2021 Prepared by: Ronny Flurry  Exercises - Long Sitting Quad Set with Towel Roll Under Heel  - 2 x daily - 7 x weekly - 2 sets - 5 reps - 5 seconds hold - Long Sitting Calf Stretch with Strap  - 2 x daily - 7 x weekly - 2 reps - 20-30 second hold - Seated Hamstring Stretch  - 2 x daily - 7 x weekly - 2 reps - 20-30 second hold    ASSESSMENT:   CLINICAL IMPRESSION: Pt continues to have gait deficits including significantly limited R TKE, decreased Wb'ing thru R LE, and decreased gait speed.  Pt is ambulating with SPC and brace.  Pt continues to have pain and is limited with ambulation distance.  She continues to have stiffness in R knee and significantly limited knee extension ROM.  Pt had pain with manual knee extension stretch and limited tolerance with stretching.  Pt seemed to have improved knee extension ROM after MT and exercises though is  not showing lasting improvements with knee extension ROM based upon visual observations.  PT did not perform STW on ITB today due to bruising on ITB though did perform STW on R HS.  Pt reports her leg feels a little better and doesn't feel like it's going to "cave in" after Rx.  Her pain level improved from 6/10 before Rx to 4-5/10 after Rx and she demonstrates improved gait.          OBJECTIVE IMPAIRMENTS Abnormal gait, decreased activity tolerance, decreased balance, decreased endurance, decreased mobility, difficulty walking, decreased ROM, decreased strength, hypomobility, impaired flexibility, postural dysfunction, and pain.    ACTIVITY LIMITATIONS bending, sitting, standing, squatting, stairs, and locomotion level   PARTICIPATION LIMITATIONS: cleaning, driving, and shopping and taking care of grandchildren   PERSONAL FACTORS 3+ comorbidities: Hx of L femur and tibia fractures with multiple surgeries, autoimmune disorder, osteoporosis though Pt states it has improved and may be osteopenia ; OA in knees, R achilles pain  are also affecting patient's functional  outcome.    REHAB POTENTIAL: Good   CLINICAL DECISION MAKING: Evolving/moderate complexity   EVALUATION COMPLEXITY: Moderate     GOALS:     SHORT TERM GOALS: Target date: 09/27/2021   Pt will be independent and compliant with HEP for improved pain, ROM, strength, and function.  Baseline: Goal status: INITIAL   2.  Pt will demo improved gait speed and reduced limp.  Baseline:  Goal status: INITIAL   3.  Pt will report improved confidence in stability with daily mobility.   Baseline:  Goal status: INITIAL   4.  Pt will report at least a 25% improvement in pain and sx's overall. Baseline:  Goal status: INITIAL   5.  Pt will demo improved L knee extension AROM to 13 deg for improved stiffness and gait.  Baseline:  Goal status: INITIAL     LONG TERM GOALS: Target date: 10/18/2021     Pt will be able to perform her  normal community ambulation without significant difficulty and pain Baseline:  Goal status: INITIAL   2.  Pt will be able to perform grocery shopping without significant pain. Baseline:  Goal status: INITIAL   3.  Pt will demo improved L knee extension AROM to no > than 3 deg for improved stiffness and gait.  Baseline:  Goal status: INITIAL   4.  Pt will ambulate without favoring R LE with no > than minimally limited TKE for improved quality of gait. Baseline:  Goal status: INITIAL   5.  Pt will be able to take care of grandchildren without adverse effects. Baseline:  Goal status: INITIAL       PLAN: PT FREQUENCY: 2x/week   PT DURATION: 6 weeks   PLANNED INTERVENTIONS: Therapeutic exercises, Therapeutic activity, Neuromuscular re-education, Balance training, Gait training, Patient/Family education, Stair training, Aquatic Therapy, Cryotherapy, Moist heat, Taping, Manual therapy, and Re-evaluation.   PLAN FOR NEXT SESSION:  Knee extension stretching, calf stretching.  Cont with core/hip strengthening.  STM to ITB and HS.  Pt sees MD today to discuss MRI results.      Selinda Michaels III PT, DPT 09/26/21 1:46 PM

## 2021-09-27 ENCOUNTER — Encounter (HOSPITAL_BASED_OUTPATIENT_CLINIC_OR_DEPARTMENT_OTHER): Payer: Medicare PPO | Admitting: Physical Therapy

## 2021-09-27 DIAGNOSIS — M5416 Radiculopathy, lumbar region: Secondary | ICD-10-CM | POA: Diagnosis not present

## 2021-09-29 ENCOUNTER — Encounter (HOSPITAL_BASED_OUTPATIENT_CLINIC_OR_DEPARTMENT_OTHER): Payer: Self-pay

## 2021-09-29 ENCOUNTER — Ambulatory Visit (HOSPITAL_BASED_OUTPATIENT_CLINIC_OR_DEPARTMENT_OTHER): Payer: Medicare PPO | Admitting: Physical Therapy

## 2021-09-29 DIAGNOSIS — M25661 Stiffness of right knee, not elsewhere classified: Secondary | ICD-10-CM

## 2021-09-29 DIAGNOSIS — M79604 Pain in right leg: Secondary | ICD-10-CM

## 2021-09-29 DIAGNOSIS — R262 Difficulty in walking, not elsewhere classified: Secondary | ICD-10-CM

## 2021-09-29 DIAGNOSIS — M25551 Pain in right hip: Secondary | ICD-10-CM

## 2021-09-29 DIAGNOSIS — M6281 Muscle weakness (generalized): Secondary | ICD-10-CM

## 2021-09-29 DIAGNOSIS — M25552 Pain in left hip: Secondary | ICD-10-CM

## 2021-09-29 NOTE — Therapy (Signed)
OUTPATIENT PHYSICAL THERAPY TREATMENT NOTE   Patient Name: Barbara [name] MRN: [id number] DOB:12/15/1950, 71 y.o., female Today's Date: 09/29/2021    END OF SESSION:   PT End of Session - 09/29/21 1209     Visit Number 5                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, nickel  03/31/2017   Anxiety    Arthritis    knees   Cancer (HCC)    basal cell- nose   GERD (gastroesophageal reflux disease)    Hyperlipidemia    Hypertension    Meningioma (HCC)    Neuromuscular disorder (HCC)    benign tremor- takes Propanolol   PONV (postoperative nausea and vomiting)    "BP DROPS ALSO"   Trigeminal neuralgia of right side of face    Past Surgical History:  Procedure Laterality Date   49 HOUR [place] STUDY N/A 04/17/2016   Procedure: [place];  Surgeon: Manus Gunning, MD;  Location: [facility];  Service: Gastroenterology;  Laterality: N/A;   ESOPHAGEAL MANOMETRY N/A 04/17/2016   Procedure: ESOPHAGEAL MANOMETRY (EM);  Surgeon: Manus Gunning, MD;  [facility];  Service: Gastroenterology;  Laterality: N/A;   EXTERNAL FIXATION LEG Left 03/28/2017   [place]   EXTERNAL FIXATION LEG Left 03/28/2017   Procedure: EXTERNAL FIXATION LEG;  Surgeon: Renette Butters, MD;  Location: [place];  Service: Orthopedics;  Laterality: Left;   EXTERNAL FIXATION REMOVAL Left 05/08/2017   Procedure: REMOVAL EXTERNAL FIXATION LEFT  LEG WITH HARDWARE REMOVAL;  Surgeon: Renette Butters, MD;  Location: [place];  Service: Orthopedics;  Laterality: Left;   fractured femur  05/2012   left - rod -screws placed   HARDWARE REMOVAL Left 04/16/2013   Procedure: REMOVAL OF HARDWARE OF LEFT KNEE (DISTAL INTERLOC SCREW);  Surgeon: Gearlean Alf, MD;  [facility];  Service: Orthopedics;  Laterality: Left;   KNEE SURGERY     x 3 left / x1 rt knee   LEFT HEART CATH AND CORONARY ANGIOGRAPHY N/A 09/18/2016   Procedure: Left Heart Cath and  Coronary Angiography;  Surgeon: Sherren Mocha, MD;  Location: [place] [facility];  Service: Cardiovascular;  Laterality: N/A;   NASAL SINUS SURGERY     2000   OTHER SURGICAL HISTORY  2014   titanium rod in left femur used for fracture repair   SHOULDER SURGERY     bilateral shoulders    WISDOM TOOTH EXTRACTION     Patient Active Problem List   Diagnosis Date Noted   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, nickel  03/31/2017   Closed left tibial fracture 03/28/2017   Closed fracture of left tibial plateau 03/28/2017   Nonintractable headache 01/25/2017   Visual field defect    Chest pain 09/16/2016   Essential tremor 09/16/2016   Right facial numbness 09/16/2016   Bilateral scleritis 08/29/2016   Obesity 06/01/2016   Dyspnea on exertion 05/13/2016   Hoarseness 03/17/2016   Gastroesophageal reflux disease without esophagitis 03/17/2016   Globus sensation 03/17/2016   Gastritis 02/25/2016   Atypical chest pain 02/24/2016   Painful orthopaedic hardware ([place]) 04/15/2013   Vitamin D deficiency 12/09/2009   HYPERCHOLESTEROLEMIA 12/09/2009   Essential hypertension 12/09/2009   Allergic rhinitis 12/09/2009   NEPHROLITHIASIS 12/09/2009   CERVICAL POLYP 12/09/2009   OSTEOARTHRITIS 12/09/2009   Cough variant asthma vs UACS  12/09/2009    PCP: Jonathon Jordan, MD   REFERRING  PROVIDER: Renette Butters, MD    REFERRING DIAG: M54.50 (ICD-10-CM) - Lumbago    Rationale for Evaluation and Treatment Rehabilitation   THERAPY DIAG:  Pain in right leg   Pain in right hip   Stiffness of right knee, not elsewhere classified   Muscle weakness (generalized)   Difficulty in walking, not elsewhere classified   Pain in left hip   ONSET DATE: Early to mid May   SUBJECTIVE:                                                                                                                                                                                            SUBJECTIVE STATEMENT: Pt saw MD on Tuesday  and received a lumbar ESI.  She pretty much had no pain on Wednesday.  She was able to straighten knee yesterday though does feel a little more stiff today.  Pt reports she was able to walk normally without cane yesterday.  Pt reports MD thinks the L5 area is the issue.        PERTINENT HISTORY:  PMHx:  Hx of L femur and tibia fractures with multiple surgeries including hardware removal ; Allergy to nickel and autoimmune disorder, osteoporosis though Pt states it has improved and may be osteopenia ; OA in knees, cervical pain, , bilat shoulder surgeries,  R achilles pain due to a reaction from statins     PRECAUTIONS: Other: Hx of L LE fractures/surgeries, allergy to nickel, osteoporosis   WEIGHT BEARING RESTRICTIONS No   FALLS:  Has patient fallen in last 6 months? No     PLOF: Independent Pt was riding schwinn bike at home for 3 miles 5-6 days/wk prior to statin reaction.  Pt was able to ambulate and perform her normal functional mobility skills with much less pain and limitation.     PATIENT GOALS to improve pain and return to PLOF     OBJECTIVE:    DIAGNOSTIC FINDINGS:  Lumbar MRI:   -Grade 1 degenerative anterolisthesis at L5-S1 -mild disc bulging L1-5, small R paracentral disc protrusion at T12-L1 without nerve root encroachment -disc degeneration with bulging and mod to advanced facet hypertrophy at L5-S1 -disc height is maintained at the additional levels, and there is no significant spinal stenosis or nerve root encroachment. Paraspinal osteophyte formation at multiple levels -no acute osseous findings.  R knee MRI:   -advanced tricompartmental OA without acute osseous findings.  (Severe OA in medial knee) -moderate size knee jt effusion with prole intra-articular loose bodies.  Recommend plain film correlation -degeneration of both menisci, especially the medial meniscus which is largely extruded peripherally from  the joint -ACL mucoid degeneration without evidence of  acute ligamentous injury  PT unable to view pt's x rays.  Pt states lumbar x rays showed bone spurs.       TODAY'S TREATMENT  Treatment deferred today due to recent injection.  PT reviewed the MRI report of her lumbar and knee.      ASSESSMENT:   CLINICAL IMPRESSION:  PT deferred Rx today due to pt having a lumbar ESI 2 days ago.  She is feeling better since having the ESI.  She reports improved knee extension and walking since ESI.  Pt brought in MRI reports and PT reviewed them.  See above for MRI findings.     OBJECTIVE IMPAIRMENTS Abnormal gait, decreased activity tolerance, decreased balance, decreased endurance, decreased mobility, difficulty walking, decreased ROM, decreased strength, hypomobility, impaired flexibility, postural dysfunction, and pain.    ACTIVITY LIMITATIONS bending, sitting, standing, squatting, stairs, and locomotion level   PARTICIPATION LIMITATIONS: cleaning, driving, and shopping and taking care of grandchildren   PERSONAL FACTORS 3+ comorbidities: Hx of L femur and tibia fractures with multiple surgeries, autoimmune disorder, osteoporosis though Pt states it has improved and may be osteopenia ; OA in knees, R achilles pain  are also affecting patient's functional outcome.    REHAB POTENTIAL: Good   CLINICAL DECISION MAKING: Evolving/moderate complexity   EVALUATION COMPLEXITY: Moderate     GOALS:     SHORT TERM GOALS: Target date: 09/27/2021   Pt will be independent and compliant with HEP for improved pain, ROM, strength, and function.  Baseline: Goal status: INITIAL   2.  Pt will demo improved gait speed and reduced limp.  Baseline:  Goal status: INITIAL   3.  Pt will report improved confidence in stability with daily mobility.   Baseline:  Goal status: INITIAL   4.  Pt will report at least a 25% improvement in pain and sx's overall. Baseline:  Goal status: INITIAL   5.  Pt will demo improved L knee extension AROM to 13 deg for  improved stiffness and gait.  Baseline:  Goal status: INITIAL     LONG TERM GOALS: Target date: 10/18/2021     Pt will be able to perform her normal community ambulation without significant difficulty and pain Baseline:  Goal status: INITIAL   2.  Pt will be able to perform grocery shopping without significant pain. Baseline:  Goal status: INITIAL   3.  Pt will demo improved L knee extension AROM to no > than 3 deg for improved stiffness and gait.  Baseline:  Goal status: INITIAL   4.  Pt will ambulate without favoring R LE with no > than minimally limited TKE for improved quality of gait. Baseline:  Goal status: INITIAL   5.  Pt will be able to take care of grandchildren without adverse effects. Baseline:  Goal status: INITIAL       PLAN: PT FREQUENCY: 2x/week   PT DURATION: 6 weeks   PLANNED INTERVENTIONS: Therapeutic exercises, Therapeutic activity, Neuromuscular re-education, Balance training, Gait training, Patient/Family education, Stair training, Aquatic Therapy, Cryotherapy, Moist heat, Taping, Manual therapy, and Re-evaluation.   PLAN FOR NEXT SESSION:  Deferred Rx today but will resume next Rx.  Knee extension stretching, calf stretching.  Cont with core/hip strengthening, gait, and STM to ITB and HS.      Selinda Michaels III PT, DPT 09/29/21 12:15 PM

## 2021-10-02 NOTE — Therapy (Signed)
OUTPATIENT PHYSICAL THERAPY TREATMENT NOTE   Patient Name: Barbara [name] MRN: [id number] DOB:02-Jun-1950, 71 y.o., female Today's Date: 10/03/2021    END OF SESSION:   PT End of Session - 10/03/21 0951     Visit Number 6    Number of Visits 12    Date for PT Re-Evaluation 10/18/21    Authorization Type [employer]    PT Start Time [phone]    PT Stop Time 1016    PT Time Calculation (min) 39 min    Activity Tolerance Patient tolerated treatment well    Behavior During Therapy [facility] for tasks assessed/performed                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, nickel  03/31/2017   Anxiety    Arthritis    knees   Cancer (HCC)    basal cell- nose   GERD (gastroesophageal reflux disease)    Hyperlipidemia    Hypertension    Meningioma (HCC)    Neuromuscular disorder (HCC)    benign tremor- takes Propanolol   PONV (postoperative nausea and vomiting)    "BP DROPS ALSO"   Trigeminal neuralgia of right side of face    Past Surgical History:  Procedure Laterality Date   63 HOUR [place] STUDY N/A 04/17/2016   Procedure: [place] STUDY;  Surgeon: Manus Gunning, MD;  Location: [facility];  Service: Gastroenterology;  Laterality: N/A;   ESOPHAGEAL MANOMETRY N/A 04/17/2016   Procedure: ESOPHAGEAL MANOMETRY (EM);  Surgeon: Manus Gunning, MD;  Location: [facility];  Service: Gastroenterology;  Laterality: N/A;   EXTERNAL FIXATION LEG Left 03/28/2017   [place]   EXTERNAL FIXATION LEG Left 03/28/2017   Procedure: EXTERNAL FIXATION LEG;  Surgeon: Renette Butters, MD;  Location: [place];  Service: Orthopedics;  Laterality: Left;   EXTERNAL FIXATION REMOVAL Left 05/08/2017   Procedure: REMOVAL EXTERNAL FIXATION LEFT  LEG WITH HARDWARE REMOVAL;  Surgeon: Renette Butters, MD;  Location: [place];  Service: Orthopedics;  Laterality: Left;   fractured femur  05/2012   left - rod -screws placed   HARDWARE REMOVAL Left 04/16/2013   Procedure:  REMOVAL OF HARDWARE OF LEFT KNEE (DISTAL INTERLOC SCREW);  Surgeon: Gearlean Alf, MD;  Location: [facility];  Service: Orthopedics;  Laterality: Left;   KNEE SURGERY     x 3 left / x1 rt knee   LEFT HEART CATH AND CORONARY ANGIOGRAPHY N/A 09/18/2016   Procedure: Left Heart Cath and Coronary Angiography;  Surgeon: Sherren Mocha, MD;  Location: [place] [facility];  Service: Cardiovascular;  Laterality: N/A;   NASAL SINUS SURGERY     2000   OTHER SURGICAL HISTORY  2014   titanium rod in left femur used for fracture repair   SHOULDER SURGERY     bilateral shoulders    WISDOM TOOTH EXTRACTION     Patient Active Problem List   Diagnosis Date Noted   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, nickel  03/31/2017   Closed left tibial fracture 03/28/2017   Closed fracture of left tibial plateau 03/28/2017   Nonintractable headache 01/25/2017   Visual field defect    Chest pain 09/16/2016   Essential tremor 09/16/2016   Right facial numbness 09/16/2016   Bilateral scleritis 08/29/2016   Obesity 06/01/2016   Dyspnea on exertion 05/13/2016   Hoarseness 03/17/2016   Gastroesophageal reflux disease without esophagitis 03/17/2016   Globus sensation 03/17/2016   Gastritis 02/25/2016  Atypical chest pain 02/24/2016   Painful orthopaedic hardware ([place]) 04/15/2013   Vitamin D deficiency 12/09/2009   HYPERCHOLESTEROLEMIA 12/09/2009   Essential hypertension 12/09/2009   Allergic rhinitis 12/09/2009   NEPHROLITHIASIS 12/09/2009   CERVICAL POLYP 12/09/2009   OSTEOARTHRITIS 12/09/2009   Cough variant asthma vs UACS  12/09/2009    PCP: Jonathon Jordan, MD   REFERRING PROVIDER: Renette Butters, MD    REFERRING DIAG: M54.50 (ICD-10-CM) - Lumbago    Rationale for Evaluation and Treatment Rehabilitation   THERAPY DIAG:  Pain in right leg   Pain in right hip   Stiffness of right knee, not elsewhere classified   Muscle weakness (generalized)   Difficulty in walking, not elsewhere classified   Pain in left  hip   ONSET DATE: Early to mid May   SUBJECTIVE:                                                                                                                                                                                            SUBJECTIVE STATEMENT: Pt saw MD on 09/27/21 and received a lumbar ESI.  She pretty much had no pain on Wednesday.  She was able to straighten knee yesterday though does feel a little more stiff today.  Pt reports she was able to walk normally without cane yesterday.  Pt reports MD thinks the L5 area is the issue.  Pt states the pain was significantly better for only 2 days though she is doing better overall.  Pt reports she is walking better and moving better overall.  Pt states she still has significant pain when standing up and moving after sitting with knee in a bent position.  Pt states her pain has been waking her up at 4-5 AM.     PERTINENT HISTORY:  PMHx:  Hx of L femur and tibia fractures with multiple surgeries including hardware removal ; Allergy to nickel and autoimmune disorder, osteoporosis though Pt states it has improved and may be osteopenia ; OA in knees, cervical pain, , bilat shoulder surgeries,  R achilles pain due to a reaction from statins   PAIN:  Are you having pain? Yes:  1/10 currently in sitting and 2-3/10 with ambulation, 8-9/10 worst, 1-2/10 best Location:  post knee, proximal calf, and post thigh  Pt denies lumbar pain and states she has some soreness in bilat post glutes and SI areas.  She states her pain is in R posterior thigh and worse in distal thigh post to knee.  Pain travels from post hip to post knee and can travel to ankle.      PRECAUTIONS: Other: Hx  of L LE fractures/surgeries, allergy to nickel, osteoporosis   WEIGHT BEARING RESTRICTIONS No   FALLS:  Has patient fallen in last 6 months? No     PLOF: Independent Pt was riding schwinn bike at home for 3 miles 5-6 days/wk prior to statin reaction.  Pt was able to  ambulate and perform her normal functional mobility skills with much less pain and limitation.     PATIENT GOALS to improve pain and return to PLOF     OBJECTIVE:    DIAGNOSTIC FINDINGS:  Lumbar MRI:   -Grade 1 degenerative anterolisthesis at L5-S1 -mild disc bulging L1-5, small R paracentral disc protrusion at T12-L1 without nerve root encroachment -disc degeneration with bulging and mod to advanced facet hypertrophy at L5-S1 -disc height is maintained at the additional levels, and there is no significant spinal stenosis or nerve root encroachment. Paraspinal osteophyte formation at multiple levels -no acute osseous findings.   R knee MRI:   -advanced tricompartmental OA without acute osseous findings.  (Severe OA in medial knee) -moderate size knee jt effusion with prole intra-articular loose bodies.  Recommend plain film correlation -degeneration of both menisci, especially the medial meniscus which is largely extruded peripherally from the joint -ACL mucoid degeneration without evidence of acute ligamentous injury   PT unable to view pt's x rays.  Pt states lumbar x rays showed bone spurs.       TODAY'S TREATMENT  -OBSERVATION:  Pt has bruising along L ITB.    -Reviewed current function, response to prior Rx, and pain level.   -Pt performed:   Nustep at L4 x 4 mins Supine marching with PPT x 20 reps Supine alt UE/LE with PPT x 15 reps   Clams with PPT with GTB 3x10 reps Seated knee extension stretch with LE on stool 2x2 mins   Standing calf stretch at wall 3x20 sec      Manual Therapy:  STM to R HS and ITB in L S/L'ing with pillow b/w knees to improve tightness, pain, and mobility.    PATIENT EDUCATION:  Education details:  PT answered pt's questions.  dx, prognosis, POC, rationale of exercises, and relevant anatomy.   Person educated: Patient Education method: Explanation Education comprehension: verbalized understanding     HOME EXERCISE PRAccess Code:  [email or web address] URL: [email or web address] Date: 09/08/2021 Prepared by: Ronny Flurry  Exercises - Long Sitting Quad Set with Towel Roll Under Heel  - 2 x daily - 7 x weekly - 2 sets - 5 reps - 5 seconds hold - Long Sitting Calf Stretch with Strap  - 2 x daily - 7 x weekly - 2 reps - 20-30 second hold - Seated Hamstring Stretch  - 2 x daily - 7 x weekly - 2 reps - 20-30 second hold    ASSESSMENT:   CLINICAL IMPRESSION: Pt is feeling better overall since the injection though her pain has returned.  She reports improved walking and demonstrates improved gait.  Pt continues to have significant deficits in knee extension.  She has increased flexion with gait lacking knee extension.  Pt was able to tolerate seated knee extension stretch with LE propped on stool.  She responded well to Rx stating she felt better and has improved gait.  She also stated she didn't need the cane as much after Rx.     OBJECTIVE IMPAIRMENTS Abnormal gait, decreased activity tolerance, decreased balance, decreased endurance, decreased mobility, difficulty walking, decreased ROM, decreased strength, hypomobility, impaired flexibility, postural dysfunction, and pain.  ACTIVITY LIMITATIONS bending, sitting, standing, squatting, stairs, and locomotion level   PARTICIPATION LIMITATIONS: cleaning, driving, and shopping and taking care of grandchildren   PERSONAL FACTORS 3+ comorbidities: Hx of L femur and tibia fractures with multiple surgeries, autoimmune disorder, osteoporosis though Pt states it has improved and may be osteopenia ; OA in knees, R achilles pain  are also affecting patient's functional outcome.    REHAB POTENTIAL: Good   CLINICAL DECISION MAKING: Evolving/moderate complexity   EVALUATION COMPLEXITY: Moderate     GOALS:     SHORT TERM GOALS: Target date: 09/27/2021   Pt will be independent and compliant with HEP for improved pain, ROM, strength, and function.  Baseline: Goal status:  INITIAL   2.  Pt will demo improved gait speed and reduced limp.  Baseline:  Goal status: INITIAL   3.  Pt will report improved confidence in stability with daily mobility.   Baseline:  Goal status: INITIAL   4.  Pt will report at least a 25% improvement in pain and sx's overall. Baseline:  Goal status: INITIAL   5.  Pt will demo improved L knee extension AROM to 13 deg for improved stiffness and gait.  Baseline:  Goal status: INITIAL     LONG TERM GOALS: Target date: 10/18/2021     Pt will be able to perform her normal community ambulation without significant difficulty and pain Baseline:  Goal status: INITIAL   2.  Pt will be able to perform grocery shopping without significant pain. Baseline:  Goal status: INITIAL   3.  Pt will demo improved L knee extension AROM to no > than 3 deg for improved stiffness and gait.  Baseline:  Goal status: INITIAL   4.  Pt will ambulate without favoring R LE with no > than minimally limited TKE for improved quality of gait. Baseline:  Goal status: INITIAL   5.  Pt will be able to take care of grandchildren without adverse effects. Baseline:  Goal status: INITIAL       PLAN: PT FREQUENCY: 2x/week   PT DURATION: 6 weeks   PLANNED INTERVENTIONS: Therapeutic exercises, Therapeutic activity, Neuromuscular re-education, Balance training, Gait training, Patient/Family education, Stair training, Aquatic Therapy, Cryotherapy, Moist heat, Taping, Manual therapy, and Re-evaluation.   PLAN FOR NEXT SESSION:  Knee extension stretching, calf stretching.  Cont with core/hip strengthening.  STM to ITB and HS.       Selinda Michaels III PT, DPT 10/03/21 5:13 PM

## 2021-10-03 ENCOUNTER — Ambulatory Visit (HOSPITAL_BASED_OUTPATIENT_CLINIC_OR_DEPARTMENT_OTHER): Payer: Medicare PPO | Admitting: Physical Therapy

## 2021-10-03 ENCOUNTER — Encounter (HOSPITAL_BASED_OUTPATIENT_CLINIC_OR_DEPARTMENT_OTHER): Payer: Self-pay | Admitting: Physical Therapy

## 2021-10-03 DIAGNOSIS — M25551 Pain in right hip: Secondary | ICD-10-CM

## 2021-10-03 DIAGNOSIS — R262 Difficulty in walking, not elsewhere classified: Secondary | ICD-10-CM | POA: Diagnosis not present

## 2021-10-03 DIAGNOSIS — M79604 Pain in right leg: Secondary | ICD-10-CM

## 2021-10-03 DIAGNOSIS — M25552 Pain in left hip: Secondary | ICD-10-CM

## 2021-10-03 DIAGNOSIS — M6281 Muscle weakness (generalized): Secondary | ICD-10-CM | POA: Diagnosis not present

## 2021-10-03 DIAGNOSIS — M25661 Stiffness of right knee, not elsewhere classified: Secondary | ICD-10-CM

## 2021-10-04 ENCOUNTER — Encounter (HOSPITAL_BASED_OUTPATIENT_CLINIC_OR_DEPARTMENT_OTHER): Payer: Medicare PPO | Admitting: Physical Therapy

## 2021-10-07 ENCOUNTER — Ambulatory Visit (HOSPITAL_BASED_OUTPATIENT_CLINIC_OR_DEPARTMENT_OTHER): Payer: Medicare PPO | Admitting: Physical Therapy

## 2021-10-07 ENCOUNTER — Encounter (HOSPITAL_BASED_OUTPATIENT_CLINIC_OR_DEPARTMENT_OTHER): Payer: Self-pay | Admitting: Physical Therapy

## 2021-10-07 DIAGNOSIS — M25551 Pain in right hip: Secondary | ICD-10-CM

## 2021-10-07 DIAGNOSIS — M6281 Muscle weakness (generalized): Secondary | ICD-10-CM | POA: Diagnosis not present

## 2021-10-07 DIAGNOSIS — M79604 Pain in right leg: Secondary | ICD-10-CM | POA: Diagnosis not present

## 2021-10-07 DIAGNOSIS — M25661 Stiffness of right knee, not elsewhere classified: Secondary | ICD-10-CM | POA: Diagnosis not present

## 2021-10-07 DIAGNOSIS — R262 Difficulty in walking, not elsewhere classified: Secondary | ICD-10-CM | POA: Diagnosis not present

## 2021-10-07 DIAGNOSIS — M25552 Pain in left hip: Secondary | ICD-10-CM

## 2021-10-07 NOTE — Therapy (Signed)
OUTPATIENT PHYSICAL THERAPY TREATMENT NOTE   Patient Name: Barbara [name] MRN: [id number] DOB:May 27, 1950, 71 y.o., female Today's Date: 10/07/2021    END OF SESSION:   PT End of Session - 10/07/21 1149     Visit Number 7    Number of Visits 12    Date for PT Re-Evaluation 10/18/21    Authorization Type [employer]    PT Start Time 1147    PT Stop Time 1229    PT Time Calculation (min) 42 min    Activity Tolerance Patient tolerated treatment well    Behavior During Therapy [facility] for tasks assessed/performed                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, nickel  03/31/2017   Anxiety    Arthritis    knees   Cancer (HCC)    basal cell- nose   GERD (gastroesophageal reflux disease)    Hyperlipidemia    Hypertension    Meningioma (HCC)    Neuromuscular disorder (HCC)    benign tremor- takes Propanolol   PONV (postoperative nausea and vomiting)    "BP DROPS ALSO"   Trigeminal neuralgia of right side of face    Past Surgical History:  Procedure Laterality Date   63 HOUR [place] STUDY N/A 04/17/2016   Procedure: [place] STUDY;  Surgeon: Manus Gunning, MD;  [facility]: [facility];  Service: Gastroenterology;  Laterality: N/A;   ESOPHAGEAL MANOMETRY N/A 04/17/2016   Procedure: ESOPHAGEAL MANOMETRY (EM);  Surgeon: Manus Gunning, MD;  [facility]: [facility];  Service: Gastroenterology;  Laterality: N/A;   EXTERNAL FIXATION LEG Left 03/28/2017   [place]   EXTERNAL FIXATION LEG Left 03/28/2017   Procedure: EXTERNAL FIXATION LEG;  Surgeon: Renette Butters, MD;  Location: [place];  Service: Orthopedics;  Laterality: Left;   EXTERNAL FIXATION REMOVAL Left 05/08/2017   Procedure: REMOVAL EXTERNAL FIXATION LEFT  LEG WITH HARDWARE REMOVAL;  Surgeon: Renette Butters, MD;  Location: [place];  Service: Orthopedics;  Laterality: Left;   fractured femur  05/2012   left - rod -screws placed   HARDWARE REMOVAL Left 04/16/2013   Procedure:  REMOVAL OF HARDWARE OF LEFT KNEE (DISTAL INTERLOC SCREW);  Surgeon: Gearlean Alf, MD;  [facility];  Service: Orthopedics;  Laterality: Left;   KNEE SURGERY     x 3 left / x1 rt knee   LEFT HEART CATH AND CORONARY ANGIOGRAPHY N/A 09/18/2016   Procedure: Left Heart Cath and Coronary Angiography;  Surgeon: Sherren Mocha, MD;  Location: [place] [facility];  Service: Cardiovascular;  Laterality: N/A;   NASAL SINUS SURGERY     2000   OTHER SURGICAL HISTORY  2014   titanium rod in left femur used for fracture repair   SHOULDER SURGERY     bilateral shoulders    WISDOM TOOTH EXTRACTION     Patient Active Problem List   Diagnosis Date Noted   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, nickel  03/31/2017   Closed left tibial fracture 03/28/2017   Closed fracture of left tibial plateau 03/28/2017   Nonintractable headache 01/25/2017   Visual field defect    Chest pain 09/16/2016   Essential tremor 09/16/2016   Right facial numbness 09/16/2016   Bilateral scleritis 08/29/2016   Obesity 06/01/2016   Dyspnea on exertion 05/13/2016   Hoarseness 03/17/2016   Gastroesophageal reflux disease without esophagitis 03/17/2016   Globus sensation 03/17/2016   Gastritis 02/25/2016  Atypical chest pain 02/24/2016   Painful orthopaedic hardware ([place]) 04/15/2013   Vitamin D deficiency 12/09/2009   HYPERCHOLESTEROLEMIA 12/09/2009   Essential hypertension 12/09/2009   Allergic rhinitis 12/09/2009   NEPHROLITHIASIS 12/09/2009   CERVICAL POLYP 12/09/2009   OSTEOARTHRITIS 12/09/2009   Cough variant asthma vs UACS  12/09/2009    PCP: Jonathon Jordan, MD   REFERRING PROVIDER: Renette Butters, MD    REFERRING DIAG: M54.50 (ICD-10-CM) - Lumbago    Rationale for Evaluation and Treatment Rehabilitation   THERAPY DIAG:  Pain in right leg   Pain in right hip   Stiffness of right knee, not elsewhere classified   Muscle weakness (generalized)   Difficulty in walking, not elsewhere classified   Pain in left  hip   ONSET DATE: Early to mid May   SUBJECTIVE:                                                                                                                                                                                            SUBJECTIVE STATEMENT: Pt saw MD on 09/27/21 and received a lumbar ESI.  Pt reports MD thinks the L5 area is the issue.   Pt reports she is walking better and moving better overall.  Pt states she still has significant pain when standing up and moving after sitting with knee in a bent position.  Pt reports a little soreness after prior Rx though no adverse effects.  Pt states her pain wakes her up b/w 5-7 AM.  Pt reports she is able to straighten her knee more with gait.      PERTINENT HISTORY:  PMHx:  Hx of L femur and tibia fractures with multiple surgeries including hardware removal ; Allergy to nickel and autoimmune disorder, osteoporosis though Pt states it has improved and may be osteopenia ; OA in knees, cervical pain, , bilat shoulder surgeries,  R achilles pain due to a reaction from statins   PAIN:  Are you having pain? Yes:  4/10 current, 8-9/10 worst, 1-2/10 best Location:  post knee, distal R HS, and heel  Pt denies lumbar pain currently though felt a pinch earlier this AM.     PRECAUTIONS: Other: Hx of L LE fractures/surgeries, allergy to nickel, osteoporosis   WEIGHT BEARING RESTRICTIONS No   FALLS:  Has patient fallen in last 6 months? No     PLOF: Independent Pt was riding schwinn bike at home for 3 miles 5-6 days/wk prior to statin reaction.  Pt was able to ambulate and perform her normal functional mobility skills with much less pain and limitation.     PATIENT GOALS  to improve pain and return to PLOF     OBJECTIVE:    DIAGNOSTIC FINDINGS:  Lumbar MRI:   -Grade 1 degenerative anterolisthesis at L5-S1 -mild disc bulging L1-5, small R paracentral disc protrusion at T12-L1 without nerve root encroachment -disc degeneration with  bulging and mod to advanced facet hypertrophy at L5-S1 -disc height is maintained at the additional levels, and there is no significant spinal stenosis or nerve root encroachment. Paraspinal osteophyte formation at multiple levels -no acute osseous findings.   R knee MRI:   -advanced tricompartmental OA without acute osseous findings.  (Severe OA in medial knee) -moderate size knee jt effusion with prole intra-articular loose bodies.  Recommend plain film correlation -degeneration of both menisci, especially the medial meniscus which is largely extruded peripherally from the joint -ACL mucoid degeneration without evidence of acute ligamentous injury   PT unable to view pt's x rays.  Pt states lumbar x rays showed bone spurs.       TODAY'S TREATMENT  -OBSERVATION:  Pt has bruising along L ITB.    -Reviewed current function, response to prior Rx, and pain level.   -Pt performed:   Nustep at L3-4 x 5 mins with bilat UE/LEs Supine marching with PPT x 20 reps Supine alt UE/LE with PPT x 15 reps Supine SLR with PPT x 10 reps   Clams with PPT with GTB 3x10 reps Seated knee extension stretch with LE on stool x3 mins   Standing calf stretch at wall 2x20 sec  Pt received gentle manual knee extension stretch with heel propped on PT's shoulder   Manual Therapy:  STM to R glute and lumbar paraspinals in L S/L'ing with pillow b/w knees to improve tightness, pain, and mobility.    PATIENT EDUCATION:  Education details:  PT answered pt's questions.  dx, prognosis, POC, rationale of exercises, and relevant anatomy.   Person educated: Patient Education method: Explanation Education comprehension: verbalized understanding     HOME EXERCISE PRAccess Code: [email or web address] URL: [email or web address] Date: 09/08/2021 Prepared by: Ronny Flurry  Exercises - Long Sitting Quad Set with Towel Roll Under Heel  - 2 x daily - 7 x weekly - 2 sets - 5 reps - 5 seconds hold - Long Sitting Calf  Stretch with Strap  - 2 x daily - 7 x weekly - 2 reps - 20-30 second hold - Seated Hamstring Stretch  - 2 x daily - 7 x weekly - 2 reps - 20-30 second hold    ASSESSMENT:   CLINICAL IMPRESSION: Pt did not use the brace today and states she only uses the brace if she is ambulating long distance.  Pt continues to have significant deficits in R knee extension ROM and has pain with stretching.  Pt has significant deficits in TKE with gait.  Attempted supine knee extension stretch with heel under PT's hand though pt unable to tolerate.  PT performed gentle knee extension stretch with heel propped on therapist's shoulder.  Pt unable to tolerate much manual OP with stretch.  She does report improved knee extension overall.  Pt has improved with knee extension ROM after Rx based on visual observation.  Pt has areas of tenderness and tightness in R glute and R lower lumbar paraspinals.  Pt reports increased pain to 5-6/10 after manual therapy though improved to 3/10 after Rx.   OBJECTIVE IMPAIRMENTS Abnormal gait, decreased activity tolerance, decreased balance, decreased endurance, decreased mobility, difficulty walking, decreased ROM, decreased strength, hypomobility, impaired flexibility, postural dysfunction, and pain.  ACTIVITY LIMITATIONS bending, sitting, standing, squatting, stairs, and locomotion level   PARTICIPATION LIMITATIONS: cleaning, driving, and shopping and taking care of grandchildren   PERSONAL FACTORS 3+ comorbidities: Hx of L femur and tibia fractures with multiple surgeries, autoimmune disorder, osteoporosis though Pt states it has improved and may be osteopenia ; OA in knees, R achilles pain  are also affecting patient's functional outcome.    REHAB POTENTIAL: Good   CLINICAL DECISION MAKING: Evolving/moderate complexity   EVALUATION COMPLEXITY: Moderate     GOALS:     SHORT TERM GOALS: Target date: 09/27/2021   Pt will be independent and compliant with HEP for improved  pain, ROM, strength, and function.  Baseline: Goal status: INITIAL   2.  Pt will demo improved gait speed and reduced limp.  Baseline:  Goal status: INITIAL   3.  Pt will report improved confidence in stability with daily mobility.   Baseline:  Goal status: INITIAL   4.  Pt will report at least a 25% improvement in pain and sx's overall. Baseline:  Goal status: INITIAL   5.  Pt will demo improved L knee extension AROM to 13 deg for improved stiffness and gait.  Baseline:  Goal status: INITIAL     LONG TERM GOALS: Target date: 10/18/2021     Pt will be able to perform her normal community ambulation without significant difficulty and pain Baseline:  Goal status: INITIAL   2.  Pt will be able to perform grocery shopping without significant pain. Baseline:  Goal status: INITIAL   3.  Pt will demo improved L knee extension AROM to no > than 3 deg for improved stiffness and gait.  Baseline:  Goal status: INITIAL   4.  Pt will ambulate without favoring R LE with no > than minimally limited TKE for improved quality of gait. Baseline:  Goal status: INITIAL   5.  Pt will be able to take care of grandchildren without adverse effects. Baseline:  Goal status: INITIAL       PLAN: PT FREQUENCY: 2x/week   PT DURATION: 6 weeks   PLANNED INTERVENTIONS: Therapeutic exercises, Therapeutic activity, Neuromuscular re-education, Balance training, Gait training, Patient/Family education, Stair training, Aquatic Therapy, Cryotherapy, Moist heat, Taping, Manual therapy, and Re-evaluation.   PLAN FOR NEXT SESSION:  Knee extension stretching, calf stretching.  Cont with core/hip strengthening.  STM to ITB and HS.       Selinda Michaels III PT, DPT 10/07/21 11:40 PM

## 2021-10-10 ENCOUNTER — Encounter (HOSPITAL_BASED_OUTPATIENT_CLINIC_OR_DEPARTMENT_OTHER): Payer: Medicare PPO | Admitting: Physical Therapy

## 2021-10-10 DIAGNOSIS — E559 Vitamin D deficiency, unspecified: Secondary | ICD-10-CM | POA: Diagnosis not present

## 2021-10-10 DIAGNOSIS — G4733 Obstructive sleep apnea (adult) (pediatric): Secondary | ICD-10-CM | POA: Diagnosis not present

## 2021-10-10 DIAGNOSIS — R7303 Prediabetes: Secondary | ICD-10-CM | POA: Diagnosis not present

## 2021-10-10 DIAGNOSIS — E668 Other obesity: Secondary | ICD-10-CM | POA: Diagnosis not present

## 2021-10-10 DIAGNOSIS — Z6834 Body mass index (BMI) 34.0-34.9, adult: Secondary | ICD-10-CM | POA: Diagnosis not present

## 2021-10-11 ENCOUNTER — Encounter (HOSPITAL_BASED_OUTPATIENT_CLINIC_OR_DEPARTMENT_OTHER): Payer: Medicare PPO | Admitting: Physical Therapy

## 2021-10-12 DIAGNOSIS — M5416 Radiculopathy, lumbar region: Secondary | ICD-10-CM | POA: Diagnosis not present

## 2021-10-13 ENCOUNTER — Encounter (HOSPITAL_BASED_OUTPATIENT_CLINIC_OR_DEPARTMENT_OTHER): Payer: Medicare PPO | Admitting: Physical Therapy

## 2021-10-21 DIAGNOSIS — E669 Obesity, unspecified: Secondary | ICD-10-CM | POA: Diagnosis not present

## 2021-10-21 DIAGNOSIS — I10 Essential (primary) hypertension: Secondary | ICD-10-CM | POA: Diagnosis not present

## 2021-10-21 DIAGNOSIS — G250 Essential tremor: Secondary | ICD-10-CM | POA: Diagnosis not present

## 2021-10-21 DIAGNOSIS — M1990 Unspecified osteoarthritis, unspecified site: Secondary | ICD-10-CM | POA: Diagnosis not present

## 2021-10-21 DIAGNOSIS — J309 Allergic rhinitis, unspecified: Secondary | ICD-10-CM | POA: Diagnosis not present

## 2021-10-21 DIAGNOSIS — K219 Gastro-esophageal reflux disease without esophagitis: Secondary | ICD-10-CM | POA: Diagnosis not present

## 2021-10-21 DIAGNOSIS — Z7409 Other reduced mobility: Secondary | ICD-10-CM | POA: Diagnosis not present

## 2021-10-21 DIAGNOSIS — Z6833 Body mass index (BMI) 33.0-33.9, adult: Secondary | ICD-10-CM | POA: Diagnosis not present

## 2021-10-21 DIAGNOSIS — M792 Neuralgia and neuritis, unspecified: Secondary | ICD-10-CM | POA: Diagnosis not present

## 2021-10-24 ENCOUNTER — Encounter (HOSPITAL_BASED_OUTPATIENT_CLINIC_OR_DEPARTMENT_OTHER): Payer: Self-pay | Admitting: Physical Therapy

## 2021-10-24 ENCOUNTER — Ambulatory Visit (HOSPITAL_BASED_OUTPATIENT_CLINIC_OR_DEPARTMENT_OTHER): Payer: Medicare PPO | Attending: Orthopedic Surgery | Admitting: Physical Therapy

## 2021-10-24 DIAGNOSIS — R262 Difficulty in walking, not elsewhere classified: Secondary | ICD-10-CM | POA: Diagnosis not present

## 2021-10-24 DIAGNOSIS — M25661 Stiffness of right knee, not elsewhere classified: Secondary | ICD-10-CM | POA: Insufficient documentation

## 2021-10-24 DIAGNOSIS — M25552 Pain in left hip: Secondary | ICD-10-CM | POA: Diagnosis not present

## 2021-10-24 DIAGNOSIS — M25551 Pain in right hip: Secondary | ICD-10-CM | POA: Insufficient documentation

## 2021-10-24 DIAGNOSIS — M79604 Pain in right leg: Secondary | ICD-10-CM | POA: Diagnosis not present

## 2021-10-24 DIAGNOSIS — M6281 Muscle weakness (generalized): Secondary | ICD-10-CM | POA: Insufficient documentation

## 2021-10-24 NOTE — Therapy (Addendum)
OUTPATIENT PHYSICAL THERAPY TREATMENT RE-EVAL    Patient Name: Barbara [name] MRN: [id number] DOB:06/20/1950, 71 y.o., female Today's Date: 10/24/2021    END OF SESSION:   PT End of Session - 10/24/21 0859     Visit Number 8    Number of Visits 16    Date for PT Re-Evaluation 12/05/21    Authorization Type [employer]    PT Start Time 0847    PT Stop Time 0927    PT Time Calculation (min) 40 min    Activity Tolerance Patient tolerated treatment well    Behavior During Therapy [facility] for tasks assessed/performed                 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, nickel  03/31/2017   Anxiety    Arthritis    knees   Cancer (HCC)    basal cell- nose   GERD (gastroesophageal reflux disease)    Hyperlipidemia    Hypertension    Meningioma (HCC)    Neuromuscular disorder (HCC)    benign tremor- takes Propanolol   PONV (postoperative nausea and vomiting)    "BP DROPS ALSO"   Trigeminal neuralgia of right side of face    Past Surgical History:  Procedure Laterality Date   93 HOUR [place] STUDY N/A 04/17/2016   Procedure: [place] STUDY;  Surgeon: Manus Gunning, MD;  Location: [facility];  Service: Gastroenterology;  Laterality: N/A;   ESOPHAGEAL MANOMETRY N/A 04/17/2016   Procedure: ESOPHAGEAL MANOMETRY (EM);  Surgeon: Manus Gunning, MD;  Location: [facility];  Service: Gastroenterology;  Laterality: N/A;   EXTERNAL FIXATION LEG Left 03/28/2017   [place]   EXTERNAL FIXATION LEG Left 03/28/2017   Procedure: EXTERNAL FIXATION LEG;  Surgeon: Renette Butters, MD;  Location: [place];  Service: Orthopedics;  Laterality: Left;   EXTERNAL FIXATION REMOVAL Left 05/08/2017   Procedure: REMOVAL EXTERNAL FIXATION LEFT  LEG WITH HARDWARE REMOVAL;  Surgeon: Renette Butters, MD;  Location: [place];  Service: Orthopedics;  Laterality: Left;   fractured femur  05/2012   left - rod -screws placed   HARDWARE REMOVAL Left 04/16/2013    Procedure: REMOVAL OF HARDWARE OF LEFT KNEE (DISTAL INTERLOC SCREW);  Surgeon: Gearlean Alf, MD;  Location: [facility];  Service: Orthopedics;  Laterality: Left;   KNEE SURGERY     x 3 left / x1 rt knee   LEFT HEART CATH AND CORONARY ANGIOGRAPHY N/A 09/18/2016   Procedure: Left Heart Cath and Coronary Angiography;  Surgeon: Sherren Mocha, MD;  Location: [place] [facility];  Service: Cardiovascular;  Laterality: N/A;   NASAL SINUS SURGERY     2000   OTHER SURGICAL HISTORY  2014   titanium rod in left femur used for fracture repair   SHOULDER SURGERY     bilateral shoulders    WISDOM TOOTH EXTRACTION     Patient Active Problem List   Diagnosis Date Noted   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, nickel  03/31/2017   Closed left tibial fracture 03/28/2017   Closed fracture of left tibial plateau 03/28/2017   Nonintractable headache 01/25/2017   Visual field defect    Chest pain 09/16/2016   Essential tremor 09/16/2016   Right facial numbness 09/16/2016   Bilateral scleritis 08/29/2016   Obesity 06/01/2016   Dyspnea on exertion 05/13/2016   Hoarseness 03/17/2016   Gastroesophageal reflux disease without esophagitis 03/17/2016   Globus sensation 03/17/2016   Gastritis 02/25/2016  Atypical chest pain 02/24/2016   Painful orthopaedic hardware ([place]) 04/15/2013   Vitamin D deficiency 12/09/2009   HYPERCHOLESTEROLEMIA 12/09/2009   Essential hypertension 12/09/2009   Allergic rhinitis 12/09/2009   NEPHROLITHIASIS 12/09/2009   CERVICAL POLYP 12/09/2009   OSTEOARTHRITIS 12/09/2009   Cough variant asthma vs UACS  12/09/2009    PCP: Jonathon Jordan, MD   REFERRING PROVIDER: Renette Butters, MD    REFERRING DIAG: M54.50 (ICD-10-CM) - Lumbago    Rationale for Evaluation and Treatment Rehabilitation   THERAPY DIAG:  Pain in right leg   Pain in right hip   Stiffness of right knee, not elsewhere classified   Muscle weakness (generalized)   Difficulty in walking, not elsewhere classified   Pain  in left hip   ONSET DATE: Early to mid May   SUBJECTIVE:                                                                                                                                                                                            SUBJECTIVE STATEMENT: They are going to give me another lumbar injection on the 25th; the leg is getting a little worse if anything, and the doctor said let's focus on the core. The neurosurgeon is trying to get me in quickly, I'm on the wait list to be seen sooner than the end of September. My leg is getting worse in terms of stiffness and pain, it starts hurting right away if I sit with my legs down, I have a huge knot on my heel that sends pain up my leg and pain from my back to the back of my knee. It will start hurting at any moment of the day and then it will also randomly stop I don't know why.    PERTINENT HISTORY:  PMHx:  Hx of L femur and tibia fractures with multiple surgeries including hardware removal ; Allergy to nickel and autoimmune disorder, osteoporosis though Pt states it has improved and may be osteopenia ; OA in knees, cervical pain, , bilat shoulder surgeries,  R achilles pain due to a reaction from statins   PAIN:  Are you having pain? Yes:  3/10 current, 8-9/10 worst, 1-2/10 best Location:  post knee, distal R HS, and heel  Pt denies lumbar pain currently though felt a pinch earlier this AM.     PRECAUTIONS: Other: Hx of L LE fractures/surgeries, allergy to nickel, osteoporosis   WEIGHT BEARING RESTRICTIONS No   FALLS:  Has patient fallen in last 6 months? No     PLOF: Independent Pt was riding schwinn bike at home for 3 miles 5-6 days/wk  prior to statin reaction.  Pt was able to ambulate and perform her normal functional mobility skills with much less pain and limitation.     PATIENT GOALS to improve pain and return to PLOF     OBJECTIVE:    DIAGNOSTIC FINDINGS:  Lumbar MRI:   -Grade 1 degenerative anterolisthesis at  L5-S1 -mild disc bulging L1-5, small R paracentral disc protrusion at T12-L1 without nerve root encroachment -disc degeneration with bulging and mod to advanced facet hypertrophy at L5-S1 -disc height is maintained at the additional levels, and there is no significant spinal stenosis or nerve root encroachment. Paraspinal osteophyte formation at multiple levels -no acute osseous findings.   R knee MRI:   -advanced tricompartmental OA without acute osseous findings.  (Severe OA in medial knee) -moderate size knee jt effusion with prole intra-articular loose bodies.  Recommend plain film correlation -degeneration of both menisci, especially the medial meniscus which is largely extruded peripherally from the joint -ACL mucoid degeneration without evidence of acute ligamentous injury   PT unable to view pt's x rays.  Pt states lumbar x rays showed bone spurs.     TODAY'S TREATMENT 8/14  Nustep L3 x6 minutes BLEs only  LUMBAR ROM:    Active  A/PROM  eval 8/14  Flexion WFL WFL   Extension 50% 50% limited compensates with hip extension   Right lateral flexion 25% 25% limited   Left lateral flexion 40% 50% limited   Right rotation 75%   Left rotation WFL    (Blank rows = not tested)   LOWER EXTREMITY ROM:      AROM/PROM Right eval Left eval R 8/14 L 8/14  Hip flexion        Hip extension        Hip abduction        Hip adduction        Hip internal rotation        Hip external rotation        Knee flexion 116 120 111 pain limited  122  Knee extension 23/16   Lacking 24 deg with LAQ 5   Lacking 12 deg with LAQ Lacking 25 degrees with LAQ  Lacking 11 degrees with LAQ   Ankle dorsiflexion        Ankle plantarflexion        Ankle inversion        Ankle eversion         (Blank rows = not tested)   LOWER EXTREMITY MMT:     MMT Right eval Left eval R 8/14 L 8/14  Hip flexion 5/5 5/5 4+/5 4+/5  Hip extension        Hip abduction 4/5 4+/5 4/5 4/5  Hip adduction         Hip internal rotation        Hip external rotation 5/5 4+/5    Knee flexion 4+/5 seated  5/5 seated 5/5 5/5  Knee extension 5/5 w/n available ROM 5/5 w/n available ROM 5/5 in available ROM 5/5 in available ROM  Ankle dorsiflexion        Ankle plantarflexion        Ankle inversion        Ankle eversion         (Blank rows = not tested)    PPT with 3 second holds 1x15   Education on POC, goals, possible insurance limitations, etc        PATIENT EDUCATION:  Education details:  PT answered  pt's questions.  dx, prognosis, POC, rationale of exercises, and relevant anatomy.   Person educated: Patient Education method: Explanation Education comprehension: verbalized understanding     HOME EXERCISE PRAccess Code: [email or web address] URL: [email or web address] Date: 09/08/2021 Prepared by: Ronny Flurry  Exercises - Long Sitting Quad Set with Towel Roll Under Heel  - 2 x daily - 7 x weekly - 2 sets - 5 reps - 5 seconds hold - Long Sitting Calf Stretch with Strap  - 2 x daily - 7 x weekly - 2 reps - 20-30 second hold - Seated Hamstring Stretch  - 2 x daily - 7 x weekly - 2 reps - 20-30 second hold    ASSESSMENT:   CLINICAL IMPRESSION:  Dasha arrives today doing OK, we took objective measurements unfortunately progress has been a bit limited however per pt report, Dr. Percell Miller would like [place] to focus on core strength and back strength to try to really address this area especially as she may be facing possible back surgery soon. We will extend PT for a brief period to build core strength and further try to address function/functional task performance before DC to advanced home program.   OBJECTIVE IMPAIRMENTS Abnormal gait, decreased activity tolerance, decreased balance, decreased endurance, decreased mobility, difficulty walking, decreased ROM, decreased strength, hypomobility, impaired flexibility, postural dysfunction, and pain.    ACTIVITY LIMITATIONS bending, sitting, standing,  squatting, stairs, and locomotion level   PARTICIPATION LIMITATIONS: cleaning, driving, and shopping and taking care of grandchildren   PERSONAL FACTORS 3+ comorbidities: Hx of L femur and tibia fractures with multiple surgeries, autoimmune disorder, osteoporosis though Pt states it has improved and may be osteopenia ; OA in knees, R achilles pain  are also affecting patient's functional outcome.    REHAB POTENTIAL: Good   CLINICAL DECISION MAKING: Evolving/moderate complexity   EVALUATION COMPLEXITY: Moderate     GOALS:     SHORT TERM GOALS: Target date: 09/27/2021   Pt will be independent and compliant with HEP for improved pain, ROM, strength, and function.  Baseline:  Goal status: MET    2.  Pt will demo improved gait speed and reduced limp.  Baseline: 8/14- "its gone backwards"  Goal status: ONGOING    3.  Pt will report improved confidence in stability with daily mobility.   Baseline: 8/14- "its gone backwards, having to use cane more regularly now"  Goal status: ONGOING    4.  Pt will report at least a 25% improvement in pain and sx's overall. Baseline: 8/14- "not happened I'm afraid"  Goal status: ONGOING    5.  Pt will demo improved L knee extension AROM to 13 deg for improved stiffness and gait.  Baseline: 8/14- no significant change  Goal status: ONGOING      LONG TERM GOALS: Target date: 10/18/2021     Pt will be able to perform her normal community ambulation without significant difficulty and pain Baseline: 8/14- pain still very limiting and has gotten worse  Goal status: ONGOING    2.  Pt will be able to perform grocery shopping without significant pain. Baseline: 8/14- pain still very limiting  Goal status: ONGOING    3.  Pt will demo improved L knee extension AROM to no > than 3 deg for improved stiffness and gait.  Baseline: 8/14- no significant change  Goal status: ONGOING    4.  Pt will ambulate without favoring R LE with no > than minimally  limited TKE for improved quality of gait. Baseline:  8/14 no significant change  Goal status: ONGOING    5.  Pt will be able to take care of grandchildren without adverse effects. Baseline: 8/14- no problems but grandchildren are fairly mobile  Goal status: ONGOING        PLAN: PT FREQUENCY: 2x/week   PT DURATION: 4 weeks   PLANNED INTERVENTIONS: Therapeutic exercises, Therapeutic activity, Neuromuscular re-education, Balance training, Gait training, Patient/Family education, Stair training, Aquatic Therapy, Cryotherapy, Moist heat, Taping, Manual therapy, and Re-evaluation.   PLAN FOR NEXT SESSION:  Knee extension stretching, calf stretching.  Cont with core/hip strengthening.  STM to ITB and HS.       Ann Lions PT DPT PN2  10/24/2021, 9:36 AM   Referring diagnosis? M54.50 Treatment diagnosis? (if different than referring diagnosis) Pain in R leg M79.604, Pain in R hip M25.551, R knee stiffness M25.661, mm weakness M62.81, difficulty walking R26.2, Pain in L hip M25.552 What was this (referring dx) caused by? '[]'  Surgery '[]'  Fall '[x]'  Ongoing issue '[]'  Arthritis '[]'  Other: ____________  Laterality: '[]'  Rt '[]'  Lt '[x]'  Both  Check all possible CPT codes:  *CHOOSE 10 OR LESS*    '[x]'  97110 (Therapeutic Exercise)  '[]'  92507 (SLP Treatment)  '[x]'  97112 (Neuro Re-ed)   '[]'  92526 (Swallowing Treatment)   '[x]'  97116 (Gait Training)   '[]'  [id number] (Cognitive Training, 1st 15 minutes) '[x]'  97140 (Manual Therapy)   '[]'  97130 (Cognitive Training, each add'l 15 minutes)  '[x]'  09604 (Re-evaluation)                              '[]'  Other, List CPT Code ____________  '[x]'  97530 (Therapeutic Activities)     '[x]'  97535 (Self Care)   '[]'  All codes above (97110 - 97535)  '[]'  97012 (Mechanical Traction)  '[]'  97014 (E-stim Unattended)  '[]'  97032 (E-stim manual)  '[]'  97033 (Ionto)  '[]'  97035 (Ultrasound) '[]'  97750 (Physical Performance Training) '[x]'  [id number] (Aquatic Therapy) '[]'  97016 (Vasopneumatic Device) '[]'  [id number]  (Paraffin) '[]'  97034 (Contrast Bath) '[]'  97597 (Wound Care 1st 20 sq cm) '[]'  97598 (Wound Care each add'l 20 sq cm) '[]'  97760 (Orthotic Fabrication, Fitting, Training Initial) '[]'  [id number] (Prosthetic Management and Training Initial) '[]'  [id number] (Orthotic or Prosthetic Training/ Modification Subsequent)         PHYSICAL THERAPY DISCHARGE SUMMARY  Visits from Start of Care: 8  Current functional level related to goals / functional outcomes: See above   Remaining deficits: See above   Education / Equipment: Anatomy of condition, POC, HEP, exercise form/rationale   Patient agrees to discharge. Patient goals were not met. Patient is being discharged due to not returning since the last visit. Jessica C. Hightower PT, DPT 05/04/22 9:17 AM

## 2021-10-26 DIAGNOSIS — M79604 Pain in right leg: Secondary | ICD-10-CM | POA: Diagnosis not present

## 2021-10-26 DIAGNOSIS — Z6830 Body mass index (BMI) 30.0-30.9, adult: Secondary | ICD-10-CM | POA: Diagnosis not present

## 2021-10-27 ENCOUNTER — Encounter: Payer: Self-pay | Admitting: Neurology

## 2021-11-04 DIAGNOSIS — M5416 Radiculopathy, lumbar region: Secondary | ICD-10-CM | POA: Diagnosis not present

## 2021-11-07 ENCOUNTER — Encounter (HOSPITAL_BASED_OUTPATIENT_CLINIC_OR_DEPARTMENT_OTHER): Payer: Medicare PPO | Admitting: Physical Therapy

## 2021-11-10 ENCOUNTER — Encounter: Payer: Self-pay | Admitting: Neurology

## 2021-11-16 ENCOUNTER — Encounter (HOSPITAL_BASED_OUTPATIENT_CLINIC_OR_DEPARTMENT_OTHER): Payer: Medicare PPO | Admitting: Physical Therapy

## 2021-11-18 ENCOUNTER — Encounter (HOSPITAL_BASED_OUTPATIENT_CLINIC_OR_DEPARTMENT_OTHER): Payer: Medicare PPO | Admitting: Physical Therapy

## 2021-11-23 ENCOUNTER — Encounter (HOSPITAL_BASED_OUTPATIENT_CLINIC_OR_DEPARTMENT_OTHER): Payer: Medicare PPO | Admitting: Physical Therapy

## 2021-11-25 ENCOUNTER — Encounter (HOSPITAL_BASED_OUTPATIENT_CLINIC_OR_DEPARTMENT_OTHER): Payer: Medicare PPO | Admitting: Physical Therapy

## 2021-12-01 DIAGNOSIS — L821 Other seborrheic keratosis: Secondary | ICD-10-CM | POA: Diagnosis not present

## 2021-12-01 DIAGNOSIS — Z85828 Personal history of other malignant neoplasm of skin: Secondary | ICD-10-CM | POA: Diagnosis not present

## 2021-12-01 DIAGNOSIS — L814 Other melanin hyperpigmentation: Secondary | ICD-10-CM | POA: Diagnosis not present

## 2021-12-01 DIAGNOSIS — Z08 Encounter for follow-up examination after completed treatment for malignant neoplasm: Secondary | ICD-10-CM | POA: Diagnosis not present

## 2021-12-01 DIAGNOSIS — D1801 Hemangioma of skin and subcutaneous tissue: Secondary | ICD-10-CM | POA: Diagnosis not present

## 2021-12-20 ENCOUNTER — Ambulatory Visit: Payer: Medicare PPO | Admitting: Neurology

## 2021-12-22 ENCOUNTER — Other Ambulatory Visit (INDEPENDENT_AMBULATORY_CARE_PROVIDER_SITE_OTHER): Payer: Medicare PPO

## 2021-12-22 ENCOUNTER — Encounter: Payer: Self-pay | Admitting: Neurology

## 2021-12-22 ENCOUNTER — Ambulatory Visit: Payer: Medicare PPO | Admitting: Neurology

## 2021-12-22 VITALS — BP 153/81 | HR 71 | Ht 62.0 in | Wt 192.0 lb

## 2021-12-22 DIAGNOSIS — M79604 Pain in right leg: Secondary | ICD-10-CM

## 2021-12-22 DIAGNOSIS — G5711 Meralgia paresthetica, right lower limb: Secondary | ICD-10-CM

## 2021-12-22 DIAGNOSIS — M7121 Synovial cyst of popliteal space [Baker], right knee: Secondary | ICD-10-CM

## 2021-12-22 DIAGNOSIS — M1990 Unspecified osteoarthritis, unspecified site: Secondary | ICD-10-CM

## 2021-12-22 NOTE — Progress Notes (Signed)
Initial neurology clinic note  SERVICE DATE: 12/22/21  Reason for Evaluation: Consultation requested by Vallarie Mare, MD for an opinion regarding right leg pain. My final recommendations will be communicated back to the requesting physician by way of shared medical record or letter to requesting physician via [place] mail.  HPI: This is Ms. Barbara [name], a 71 y.o. right-handed female with a medical history of HTN, HLD, allergies, endometrial polyps, OA, osteoporosis, meningioma, GERD who presents to neurology clinic with the chief complaint of right leg pain. The patient is accompanied by husband.  Patient was referred by [facility] (Dr. Marcello Moores). Per documentation provided, her symptoms became in fall of 2022 after starting a statin. Within 1 week she had bilateral leg pain with muscle spasms/cramps. She stopped the statin and the symptoms in her left leg resolved. The right leg pain continued however. She describes the pain as a stabbing pain that would go from her buttocks to her heel. It can radiate from the back as well. There is also burning pain in her heel. If she sits too long, she can hardly walk on her leg. She states the achilles tendon is still inflamed. She was found to have a Baker's cyst as well. Around 05/2021 the right leg pain worsened. She only takes tylenol for pain because she is so sensitive to medications.  MRI lumbar spine wo contrast showed modest degenerative disease with L5-S1 spondylolisthesis without significant disc degeneration or stenosis per notes.   She did PT. Things go worse during PT because she couldn't straighten her leg. She had an injection in July 2023 which gave relief, but only for 2 days. She had another injection about 2 weeks ago that helped much more significantly. She does not know what was injected though. DVT scan of the leg was negative.   Patient also endorses neck pain and was scheduled for surgery in the  past, but this was cancelled due to her breaking her left leg.  She has gained 15-20 over the last year. She denies wearing tight clothing.  Of note, patient was diagnosed with diffuse anterior bilateral nuclear sclerosis by Dr. Manuella Ghazi at [facility] neurolog[facility]. She was previously treated with steroids, which she did not tolerate well.  Patient also saw Dr. Krista Blue at [facility] in 2018 for headaches and essential tremor.  Of note, she has a lot of medication allergies and prior reactions.  The patient has not noticed any recent skin rashes nor does she report any constitutional symptoms like fever, night sweats, anorexia or unintentional weight loss.  EtOH use: None  Restrictive diet? None Family history of neuropathy/myopathy/NM disease? No  She has never had an EMG.   MEDICATIONS:  Outpatient Encounter Medications as of 12/22/2021  Medication Sig   Cholecalciferol (VITAMIN D-3) 25 MCG (1000 UT) CAPS Take 1 capsule by mouth daily.   levocetirizine (XYZAL) 5 MG tablet Take 5 mg by mouth daily as needed for allergies.    loratadine (CLARITIN) 10 MG tablet Take 10 mg by mouth daily as needed for allergies.   Milk Thistle 250 MG CAPS Take 1 capsule by mouth 2 (two) times daily.   Multiple Vitamins-Minerals (ICAPS PO) Take 1 capsule by mouth 2 (two) times daily.   omeprazole (PRILOSEC) 20 MG capsule Take 20 mg by mouth daily.   propranolol (INDERAL) 10 MG tablet Take 10 mg by mouth 3 (three) times daily.   psyllium (HYDROCIL/METAMUCIL) 95 % PACK Take 1 packet by mouth 2 (  two) times daily.   b complex vitamins tablet Take 1 tablet by mouth daily. (Patient not taking: Reported on 12/22/2021)   guaiFENesin (MUCINEX) 600 MG 12 hr tablet Take by mouth as needed. (Patient not taking: Reported on 12/22/2021)   Menaquinone-7 (VITAMIN K2 PO) Take 1 tablet by mouth daily.   No facility-administered encounter medications on file as of 12/22/2021.    PAST MEDICAL HISTORY: Past Medical History:  Diagnosis  Date   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, nickel  03/31/2017   Anxiety    Arthritis    knees   Cancer (HCC)    basal cell- nose   GERD (gastroesophageal reflux disease)    Hyperlipidemia    Hypertension    Meningioma (HCC)    Neuromuscular disorder (HCC)    benign tremor- takes Propanolol   PONV (postoperative nausea and vomiting)    "BP DROPS ALSO"   Trigeminal neuralgia of right side of face     PAST SURGICAL HISTORY: Past Surgical History:  Procedure Laterality Date   57 HOUR [place] STUDY N/A 04/17/2016   Procedure: [place] STUDY;  Surgeon: Manus Gunning, MD;  Location: [facility];  Service: Gastroenterology;  Laterality: N/A;   ESOPHAGEAL MANOMETRY N/A 04/17/2016   Procedure: ESOPHAGEAL MANOMETRY (EM);  Surgeon: Manus Gunning, MD;  Location: [facility];  Service: Gastroenterology;  Laterality: N/A;   EXTERNAL FIXATION LEG Left 03/28/2017   [place]   EXTERNAL FIXATION LEG Left 03/28/2017   Procedure: EXTERNAL FIXATION LEG;  Surgeon: Renette Butters, MD;  Location: [place];  Service: Orthopedics;  Laterality: Left;   EXTERNAL FIXATION REMOVAL Left 05/08/2017   Procedure: REMOVAL EXTERNAL FIXATION LEFT  LEG WITH HARDWARE REMOVAL;  Surgeon: Renette Butters, MD;  Location: [place];  Service: Orthopedics;  Laterality: Left;   fractured femur  05/2012   left - rod -screws placed   HARDWARE REMOVAL Left 04/16/2013   Procedure: REMOVAL OF HARDWARE OF LEFT KNEE (DISTAL INTERLOC SCREW);  Surgeon: Gearlean Alf, MD;  Location: [facility];  Service: Orthopedics;  Laterality: Left;   KNEE SURGERY     x 3 left / x1 rt knee   LEFT HEART CATH AND CORONARY ANGIOGRAPHY N/A 09/18/2016   Procedure: Left Heart Cath and Coronary Angiography;  Surgeon: Sherren Mocha, MD;  Location: [place] [facility];  Service: Cardiovascular;  Laterality: N/A;   NASAL SINUS SURGERY     2000   OTHER SURGICAL HISTORY  2014   titanium rod in left femur used for fracture repair   SHOULDER SURGERY      bilateral shoulders    WISDOM TOOTH EXTRACTION      ALLERGIES: Allergies  Allergen Reactions   Ace Inhibitors     Pt unsure- nausea or hives   Amoxicillin Hives   Azathioprine Other (See Comments)    "cardiac issues"   Celebrex [Celecoxib] Other (See Comments)    Severe stomach cramps   Crestor [Rosuvastatin] Other (See Comments)    Bilat leg/heel pains/swelling   Enalapril Other (See Comments)    congestion   Fentanyl Nausea And Vomiting   Fish Oil Other (See Comments)    Broke out with blisters   Forteo [Parathyroid Hormone (Recomb)]    Gabapentin     Pain and swelling    Lanolin Acid [Lanolin]     rash   Levofloxacin Other (See Comments)    "sore ankles"   Maxzide [Triamterene-Hctz] Other (See Comments)    Knee, ankles and back pain  Methotrexate Derivatives Other (See Comments)    "cardiac issues"   Nickel     rash   Other Nausea And Vomiting    Narcotic Pain Killers- vomitting, nausea constantly   Penicillins Hives    Has patient had a PCN reaction causing immediate rash, facial/tongue/throat swelling, SOB or lightheadedness with hypotension: unknown Has patient had a PCN reaction causing severe rash involving mucus membranes or skin necrosis: no Has patient had a PCN reaction that required hospitalization - no, was at MD office Has patient had a PCN reaction occurring within the last 10 years: no If all of the above answers are "NO", then may proceed with Cephalosporin use.    Prednisone Other (See Comments)    High blood pressure.   Propoxyphene Hcl Nausea Only   Sulfonamide Derivatives Nausea Only and Other (See Comments)    Stomach cramping   Synvisc [Hylan G-F 20]     Made knee swell up at injection site   Tramadol    Sulfa Antibiotics Nausea Only, Rash and Nausea And Vomiting    FAMILY HISTORY: Family History  Problem Relation Age of Onset   Stomach cancer Mother    Heart disease Mother 50       First MI early 66s, CABG    Lung cancer Father         smoked   Allergies Father    Heart attack Father 78   Brain cancer Brother    Colon cancer Neg Hx    Esophageal cancer Neg Hx    Rectal cancer Neg Hx     SOCIAL HISTORY: Social History   Tobacco Use   Smoking status: Never   Smokeless tobacco: Never  Vaping Use   Vaping Use: Never used  Substance Use Topics   Alcohol use: Yes    Comment: RARE   Drug use: No   Social History   Social History Narrative   Lives at home at home with husband. In a one story home   Right-handed.   No caffeine use.     OBJECTIVE: PHYSICAL EXAM: BP (!) 153/81   Pulse 71   Ht '5\' 2"'  (1.575 m)   Wt 192 lb (87.1 kg)   SpO2 96%   BMI 35.12 kg/m   General: General appearance: Awake and alert. No distress. Cooperative with exam.  Skin: No obvious rash or jaundice. HEENT: Atraumatic. Anicteric. Lungs: Non-labored breathing on room air  Extremities: No edema. Swelling behind the right knee (?Baker's cyst), making it difficult for patient to fully extend right leg. Left leg with deformities from prior fractures. Psych: Affect appropriate.  Neurological: Mental Status: Alert. Speech fluent. No pseudobulbar affect Cranial Nerves: CNII: No RAPD. Visual fields grossly intact. CNIII, IV, VI: PERRL. No nystagmus. EOMI. CN V: Facial sensation intact bilaterally to fine touch. CN VII: Facial muscles symmetric and strong. No ptosis at rest. CN VIII: Hearing grossly intact bilaterally. CN IX: No hypophonia. CN X: Palate elevates symmetrically. CN XI: Full strength shoulder shrug bilaterally. CN XII: Tongue protrusion full and midline. No atrophy or fasciculations. No significant dysarthria Motor: Tone is normal. No fasciculations in extremities. No atrophy.  Individual muscle group testing (MRC grade out of 5):  Movement     Neck flexion 5    Neck extension 5     Right Left   Shoulder abduction 5 5   Shoulder adduction 5 5   Shoulder ext rotation 5 5   Shoulder int rotation 5 5    Elbow  flexion 5 5   Elbow extension 5 5   Wrist extension 5 5   Wrist flexion 5 5   Finger abduction - FDI 5 5   Finger abduction - ADM 5 5   Finger extension 5 5   Finger distal flexion - 2/'3 5 5   ' Finger distal flexion - 4/'5 5 5   ' Thumb flexion - FPL 5 5   Thumb abduction - APB 5 5    Hip flexion 5 5   Hip extension 5 5   Hip adduction 5 5   Hip abduction 5 5   Knee extension 5 5   Knee flexion 5 5   Dorsiflexion 5 5   Plantarflexion 5 5   Inversion 5 5   Eversion 5 5   Great toe extension 5 5   Great toe flexion 5 5     Reflexes:  Right Left   Bicep 2+ 2+   Tricep 2+ 2+   BrRad 2+ 2+   Knee 2+ 2+   Ankle 2+ 2+    Pathological Reflexes: Babinski: flexor response bilaterally Hoffman: absent bilaterally Troemner: absent bilaterally  Sensation: Pinprick: Intact except: reduced over right lateral thigh in distribution of right lateral femoral cutaneous nerve Vibration: Intact in all extremities Proprioception: Intact in bilateral great toes Coordination: Intact finger-to- nose-finger bilaterally. Intention tremor in bilateral hands. Romberg negative. Gait: Able to rise from chair with arms crossed unassisted. Antalgic gait.   Lab and Test Review: Internal labs: CK (06/25/20): 65 (24 five years prior) ESR (06/25/20): 2 Vit D (04/20/20): 32.7 TSH (04/20/20): 1.40   MRI lumbar spine wo contrast (external, 09/22/21): [place] degenerative disease with L5-S1 spondylolisthesis without significant disc degeneration or stenosis per notes. I personally reviewed MRI and agree that there is no significant stenosis.  MRI brain w/wo contrast (09/16/16): FINDINGS: Brain: Cerebral volume within normal limits for age. Few scattered subcentimeter foci of T2/FLAIR hyperintensity present within the periventricular white matter of both cerebral hemispheres, nonspecific, but mild for age. No other focal parenchymal signal abnormality identified. No abnormal foci of restricted diffusion  to suggest acute or subacute ischemia. Gray-white matter differentiation well maintained. No areas of encephalomalacia to suggest chronic infarction. No evidence for acute or chronic intracranial hemorrhage.   Small solidly enhancing mass measuring 10 x 8 x 7 mm straddling the anterior falx is most consistent with a small meningioma (series 14, image 40). No associated mass effect. No other mass lesion, mass effect, or midline shift. Ventricles normal size without evidence for hydrocephalus. No extra-axial fluid collection. Major dural sinuses are patent.   No abnormal enhancement. Specifically, no abnormal enhancement seen involving or along the course of either trigeminal nerve. [place] are symmetric and within normal limits bilaterally. No findings to suggest vascular compression.   Pituitary gland suprasellar region within normal limits. Midline structures intact and normal.   Vascular: Major intracranial vascular flow voids are well maintained and normal.   Skull and upper cervical spine: Craniocervical junction within normal limits. Visualized upper cervical spine unremarkable. Bone marrow signal intensity within normal limits. No scalp soft tissue abnormality.   Sinuses/Orbits: Globes and orbital soft tissues within normal limits. Paranasal sinuses are clear. No mastoid effusion. Inner ear structures normal.   IMPRESSION: 1. Mild nonspecific cerebral white matter changes for age. 2. Small 10 x 8 x 7 mm meningioma straddling the anterior falx without significant mass effect. 3. Otherwise unremarkable and normal brain MRI. No structural findings to explain patient's symptoms identified.  MRI cervical spine (03/10/12): IMPRESSION:   1.  Small disc protrusion and spurs extending into the right neural  foramen at C7-T1 which could impinge upon the right C8 nerve.  2. Moderate left and slight right foraminal stenosis at C6-7.  3.  Moderate to severe facet  arthritis at C2-3 through C4-5 on the  right.    ASSESSMENT: Barbara [name] is a 71 y.o. female who presents for evaluation of right leg pain. She has a relevant medical history of HTN, HLD, allergies, endometrial polyps, OA, osteoporosis, meningioma, GERD. Her neurological examination is pertinent for reduced sensation of right lateral thigh and analgic gait. Her extremity exam is significant for swelling behind right knee, perhaps Baker's cyst and inability to fully extend her right leg. Available diagnostic data is significant for MRI lumbar spine without significant stenosis. 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 This was an unexpected finding on exam and may explain some of her burning pain on the lateral thigh, but would not explain knee pain or pain into buttocks or lower leg. There is no clear other neuropathy or radiculopathy on exam. I will investigate further as below.  PLAN: -Blood work: B6, B12 -EMG of RLE -NMUS of right leg   -Return to clinic to be determined  The impression above as well as the plan as outlined below were extensively discussed with the patient (in the company of husband) who voiced understanding. All questions were answered to their satisfaction.  The patient was counseled on pertinent fall precautions per the printed material provided today, and as noted under the "Patient Instructions" section below.  When available, results of the above investigations and possible further recommendations will be communicated to the patient via telephone/MyChart. Patient to call office if not contacted after expected testing turnaround time.   Total time spent reviewing records, interview, history/exam, documentation, and coordination of care on day of encounter:  75 min   Thank you for allowing me to participate in  patient's care.  If I can answer any additional questions, I would be pleased to do so.  Kai Levins, MD   CC: Jonathon Jordan, MD [address] [place] [address]  CC: Referring provider: Vallarie Mare, MD [address] [place],  [place] [address]

## 2021-12-22 NOTE — Patient Instructions (Addendum)
I saw you today for right leg pain. I found some sensory problems on your right lateral thigh that could be due to compression of the right lateral cutaneous nerve, called meralgia paresthetica. This would not explain all of your leg pain or not being able to straighten your leg though.  I would like to evaluate further.  I would like to get some lab work today.  I would like to get an EMG of the right leg and a neuromuscular ultrasound of the right leg.  You can try lidocaine cream on your leg. This can quiet the burning without causing side effects. You can buy this over the counter.  I will be in touch when I have your results. We will determine next steps at that point.  Please let me know if you have any questions or concerns in the meantime.  The physicians and staff at [facility] are committed to providing excellent care. You may receive a survey requesting feedback about your experience at our office. We strive to receive "very good" responses to the survey questions. If you feel that your experience would prevent you from giving the office a "very good " response, please contact our office to try to remedy the situation. We may be reached at [phone]. Thank you for taking the time out of your busy day to complete the survey.  Kai Levins, MD [facility]  Preventing Falls at [facility] are common, often dreaded events in the lives of older people. Aside from the obvious injuries and even death that may result, fall can cause wide-ranging consequences including loss of independence, mental decline, decreased activity and mobility. Younger people are also at risk of falling, especially those with chronic illnesses and fatigue.  Ways to reduce risk for falling Examine diet and medications. Warm foods and alcohol dilate blood vessels, which can lead to dizziness when standing. Sleep aids, antidepressants and pain medications can also increase the likelihood of a  fall.  Get a vision exam. Poor vision, cataracts and glaucoma increase the chances of falling.  Check foot gear. Shoes should fit snugly and have a sturdy, nonskid sole and a broad, low heel  Participate in a physician-approved exercise program to build and maintain muscle strength and improve balance and coordination. Programs that use ankle weights or stretch bands are excellent for muscle-strengthening. Water aerobics programs and low-impact Tai Chi programs have also been shown to improve balance and coordination.  Increase vitamin D intake. Vitamin D improves muscle strength and increases the amount of calcium the body is able to absorb and deposit in bones.  How to prevent falls from common hazards Floors - Remove all loose wires, cords, and throw rugs. Minimize clutter. Make sure rugs are anchored and smooth. Keep furniture in its usual place.  Chairs -- Use chairs with straight backs, armrests and firm seats. Add firm cushions to existing pieces to add height.  Bathroom - Install grab bars and non-skid tape in the tub or shower. Use a bathtub transfer bench or a shower chair with a back support Use an elevated toilet seat and/or safety rails to assist standing from a low surface. Do not use towel racks or bathroom tissue holders to help you stand.  Lighting - Make sure halls, stairways, and entrances are well-lit. Install a night light in your bathroom or hallway. Make sure there is a light switch at the top and bottom of the staircase. Turn lights on if you get up in the middle  of the night. Make sure lamps or light switches are within reach of the bed if you have to get up during the night.  Kitchen - Install non-skid rubber mats near the sink and stove. Clean spills immediately. Store frequently used utensils, pots, pans between waist and eye level. This helps prevent reaching and bending. Sit when getting things out of lower cupboards.  Living room/ Bedrooms - Place furniture with  wide spaces in between, giving enough room to move around. Establish a route through the living room that gives you something to hold onto as you walk.  Stairs - Make sure treads, rails, and rugs are secure. Install a rail on both sides of the stairs. If stairs are a threat, it might be helpful to arrange most of your activities on the lower level to reduce the number of times you must climb the stairs.  Entrances and doorways - Install metal handles on the walls adjacent to the doorknobs of all doors to make it more secure as you travel through the doorway.  Tips for maintaining balance Keep at least one hand free at all times. Try using a backpack or fanny pack to hold things rather than carrying them in your hands. Never carry objects in both hands when walking as this interferes with keeping your balance.  Attempt to swing both arms from front to back while walking. This might require a conscious effort if Parkinson's disease has diminished your movement. It will, however, help you to maintain balance and posture, and reduce fatigue.  Consciously lift your feet off of the ground when walking. Shuffling and dragging of the feet is a common culprit in losing your balance.  When trying to navigate turns, use a "U" technique of facing forward and making a wide turn, rather than pivoting sharply.  Try to stand with your feet shoulder-length apart. When your feet are close together for any length of time, you increase your risk of losing your balance and falling.  Do one thing at a time. Don't try to walk and accomplish another task, such as reading or looking around. The decrease in your automatic reflexes complicates motor function, so the less distraction, the better.  Do not wear rubber or gripping soled shoes, they might "catch" on the floor and cause tripping.  Move slowly when changing positions. Use deliberate, concentrated movements and, if needed, use a grab bar or walking aid. Count 15  seconds between each movement. For example, when rising from a seated position, wait 15 seconds after standing to begin walking.  If balance is a continuous problem, you might want to consider a walking aid such as a cane, walking stick, or walker. Once you've mastered walking with help, you might be ready to try it on your own again.

## 2021-12-23 LAB — VITAMIN B12: Vitamin B-12: 314 pg/mL (ref 211–911)

## 2021-12-26 ENCOUNTER — Telehealth: Payer: Self-pay

## 2021-12-26 LAB — VITAMIN B6: Vitamin B6: 18 ng/mL (ref 2.1–21.7)

## 2021-12-26 NOTE — Telephone Encounter (Signed)
No PA needed for [facility].

## 2021-12-30 ENCOUNTER — Ambulatory Visit: Payer: Medicare PPO | Admitting: Neurology

## 2022-01-02 ENCOUNTER — Other Ambulatory Visit: Payer: Self-pay

## 2022-01-02 DIAGNOSIS — M79604 Pain in right leg: Secondary | ICD-10-CM

## 2022-01-13 DIAGNOSIS — Z1231 Encounter for screening mammogram for malignant neoplasm of breast: Secondary | ICD-10-CM | POA: Diagnosis not present

## 2022-01-31 ENCOUNTER — Ambulatory Visit (INDEPENDENT_AMBULATORY_CARE_PROVIDER_SITE_OTHER): Payer: Medicare PPO | Admitting: Neurology

## 2022-01-31 ENCOUNTER — Ambulatory Visit: Payer: Medicare PPO | Admitting: Neurology

## 2022-01-31 ENCOUNTER — Telehealth: Payer: Self-pay | Admitting: Neurology

## 2022-01-31 DIAGNOSIS — M79604 Pain in right leg: Secondary | ICD-10-CM

## 2022-01-31 DIAGNOSIS — M7121 Synovial cyst of popliteal space [Baker], right knee: Secondary | ICD-10-CM | POA: Diagnosis not present

## 2022-01-31 DIAGNOSIS — G5711 Meralgia paresthetica, right lower limb: Secondary | ICD-10-CM | POA: Diagnosis not present

## 2022-01-31 DIAGNOSIS — M1990 Unspecified osteoarthritis, unspecified site: Secondary | ICD-10-CM

## 2022-01-31 NOTE — Procedures (Signed)
  [facility]  [address], [place] [address]  [place], [place] [address] Tel: [phone] Fax: [phone] Test Date:  01/31/2022  Patient: Barbara [name] DOB: Feb 13, 1951 Physician: Kai Levins, MD  Sex: Female Height: '5\' 2"'$  Ref Phys: Kai Levins, MD  ID#: [id number]   Technician:    History: This is a 71 year old female with right leg pain.  NCV & EMG Findings: Extensive electrodiagnostic evaluation of the right lower limb shows: Right sural and superficial fibular/peroneal sensory responses are within normal limits. Right fibular/peroneal (EDB) and tibial (AH) motor responses are within normal limits. Right H reflex is absent. There is no evidence of active or chronic motor axon loss changes affecting any of the tested muscles. Motor unit configuration and recruitment pattern is within normal limits.  Impression: This is a normal study of the right lower extremity. In particular, there is no electrodiagnostic evidence of a right lumbosacral (L3-S1) motor radiculopathy or large fiber sensorimotor neuropathy.   ___________________________ Kai Levins, MD    Nerve Conduction Studies Motor Nerve Results    Latency Amplitude F-Lat Segment Distance CV Comment  Site (ms) Norm (mV) Norm (ms)  (cm) (m/s) Norm   Right Fibular (EDB) Motor  Ankle 3.4  < 6.0 6.7  > 2.5        Bel fib head 9.4 - 6.1 -  Bel fib head-Ankle 29 48  > 40   Pop fossa 11.4 - 6.0 -  Pop fossa-Bel fib head 9 45 -   Right Tibial (AH) Motor  Ankle 4.0  < 6.0 6.0  > 4.0        Knee 12.0 - 5.3 -  Knee-Ankle 40.5 51  > 40    Sensory Sites    Neg Peak Lat Amplitude (O-P) Segment Distance Velocity Comment  Site (ms) Norm (V) Norm  (cm) (ms)   Right Superficial Fibular Sensory  14 cm-Ankle 2.9  < 4.6 8  > 3 14 cm-Ankle 14    Right Sural Sensory  Calf-Lat mall 3.6  < 4.6 8  > 3 Calf-Lat mall 14     H-Reflex Results    M-Lat H Lat H Neg Amp H-M Lat  Site (ms) (ms) Norm (mV) (ms)  Right Tibial  H-Reflex  Pop fossa 3.8 -  < 35.0 NR -   Electromyography   Side Muscle Ins.Act Fibs Fasc Recrt Amp Dur Poly Activation Comment  Right Tib ant Nml Nml Nml Nml Nml Nml Nml Nml N/A  Right Gastroc MH Nml Nml Nml Nml Nml Nml Nml 2- N/A  Right Rectus fem Nml Nml Nml Nml Nml Nml Nml Nml N/A  Right Biceps fem SH Nml Nml Nml Nml Nml Nml Nml Nml N/A  Right Gluteus med Nml Nml Nml Nml Nml Nml Nml Nml N/A      Waveforms:  Motor      Sensory      H-Reflex

## 2022-01-31 NOTE — Procedures (Signed)
  [facility]  [address], [address]  [place], [place] [address] Tel: [phone] Fax: [phone] Test Date:  01/31/2022  Patient: Barbara [name] DOB: 07-01-1950 Physician: Kai Levins, MD  Sex: Female Height: '5\' 2"'$  Ref Phys: Kai Levins, MD  ID#: [id number]   Technician:    History: This is a 71 year old female with right leg pain.  Findings: High frequency (4.0-16.0 MHz) B-mode, nonvascular ultrasound of the right lower limb shows: Cross section areas (CSA) of the right sciatic nerve (popliteal fossa), peroneal nerve (popliteal fossa to fibular head), and tibial nerve (popliteal fossa) are within normal limits.  Impression: This is a normal neuromuscular ultrasound of the right lower limb. Specifically, there is no ultrasonographic evidence of entrapment or other focal pathology along the studied course of the right sciatic nerve (popliteal fossa), peroneal nerve (popliteal fossa to fibular head), or tibial nerve (popliteal fossa).  No other obvious lesion involving the adjacent bone or tendon is identified. No definite vascular abnormalities.   _______________  Kai Levins, MD [facility]  Nerve Measurements   Site Area Mobility Vascularity Comment   mm Norm     Right Fibular  Fib head 4.9  < 17.8      Pop fossa 6.5  < 20.9      Right Sciatic  Distal Thigh 36.2  < 80.0      Right Tibial  Pop fossa 13.7  < 55.9       Ultrasound Images:

## 2022-01-31 NOTE — Telephone Encounter (Signed)
Discussed the results of patient's EMG and NMUS after the procedure today. Both the EMG and NMUS were normal with no muscle or nerve pathology identified.  All questions were answered.  Kai Levins, MD [facility]

## 2022-02-06 DIAGNOSIS — U071 COVID-19: Secondary | ICD-10-CM | POA: Diagnosis not present

## 2022-02-07 ENCOUNTER — Other Ambulatory Visit: Payer: Medicare PPO | Admitting: Neurology

## 2022-02-07 ENCOUNTER — Encounter: Payer: Medicare PPO | Admitting: Neurology

## 2022-02-16 ENCOUNTER — Telehealth: Payer: Self-pay | Admitting: Neurology

## 2022-02-16 NOTE — Telephone Encounter (Signed)
Called pt and reminded her that Dr. Berdine Addison informed her of the results were told to her the day of procedures, she remembered. Also that Dr. Percell Miller is also with Epic and should see our notes and results of labs. If there is a problem call back.

## 2022-02-16 NOTE — Telephone Encounter (Signed)
Left message with after hour service on 02-16-22 '@12'$ :30pm  Caller states that she is calling to get test results from testing that was done 2 weeks ago. She would like the results sent to her orthopedic as they have not been sent

## 2022-03-03 DIAGNOSIS — M25561 Pain in right knee: Secondary | ICD-10-CM | POA: Diagnosis not present

## 2022-03-14 DIAGNOSIS — M25561 Pain in right knee: Secondary | ICD-10-CM | POA: Diagnosis not present

## 2022-03-30 DIAGNOSIS — M170 Bilateral primary osteoarthritis of knee: Secondary | ICD-10-CM | POA: Diagnosis not present

## 2022-04-06 DIAGNOSIS — M170 Bilateral primary osteoarthritis of knee: Secondary | ICD-10-CM | POA: Diagnosis not present

## 2022-04-13 DIAGNOSIS — M170 Bilateral primary osteoarthritis of knee: Secondary | ICD-10-CM | POA: Diagnosis not present

## 2022-04-28 DIAGNOSIS — R768 Other specified abnormal immunological findings in serum: Secondary | ICD-10-CM | POA: Diagnosis not present

## 2022-04-28 DIAGNOSIS — E669 Obesity, unspecified: Secondary | ICD-10-CM | POA: Diagnosis not present

## 2022-04-28 DIAGNOSIS — M79604 Pain in right leg: Secondary | ICD-10-CM | POA: Diagnosis not present

## 2022-04-28 DIAGNOSIS — M2550 Pain in unspecified joint: Secondary | ICD-10-CM | POA: Diagnosis not present

## 2022-04-28 DIAGNOSIS — M6281 Muscle weakness (generalized): Secondary | ICD-10-CM | POA: Diagnosis not present

## 2022-04-28 DIAGNOSIS — M2540 Effusion, unspecified joint: Secondary | ICD-10-CM | POA: Diagnosis not present

## 2022-04-28 DIAGNOSIS — Z6833 Body mass index (BMI) 33.0-33.9, adult: Secondary | ICD-10-CM | POA: Diagnosis not present

## 2022-04-28 DIAGNOSIS — H5789 Other specified disorders of eye and adnexa: Secondary | ICD-10-CM | POA: Diagnosis not present

## 2022-05-15 DIAGNOSIS — E669 Obesity, unspecified: Secondary | ICD-10-CM | POA: Diagnosis not present

## 2022-05-15 DIAGNOSIS — M2540 Effusion, unspecified joint: Secondary | ICD-10-CM | POA: Diagnosis not present

## 2022-05-15 DIAGNOSIS — M79604 Pain in right leg: Secondary | ICD-10-CM | POA: Diagnosis not present

## 2022-05-15 DIAGNOSIS — Z6832 Body mass index (BMI) 32.0-32.9, adult: Secondary | ICD-10-CM | POA: Diagnosis not present

## 2022-05-15 DIAGNOSIS — R768 Other specified abnormal immunological findings in serum: Secondary | ICD-10-CM | POA: Diagnosis not present

## 2022-05-15 DIAGNOSIS — M2550 Pain in unspecified joint: Secondary | ICD-10-CM | POA: Diagnosis not present

## 2022-05-15 DIAGNOSIS — H5789 Other specified disorders of eye and adnexa: Secondary | ICD-10-CM | POA: Diagnosis not present

## 2022-05-15 DIAGNOSIS — M6281 Muscle weakness (generalized): Secondary | ICD-10-CM | POA: Diagnosis not present

## 2022-05-19 ENCOUNTER — Emergency Department (HOSPITAL_BASED_OUTPATIENT_CLINIC_OR_DEPARTMENT_OTHER)
Admission: EM | Admit: 2022-05-19 | Discharge: 2022-05-19 | Disposition: A | Discharge status: Home / Self Care | Payer: Medicare PPO | Attending: Emergency Medicine | Admitting: Emergency Medicine

## 2022-05-19 ENCOUNTER — Other Ambulatory Visit: Payer: Self-pay

## 2022-05-19 ENCOUNTER — Emergency Department (HOSPITAL_BASED_OUTPATIENT_CLINIC_OR_DEPARTMENT_OTHER): Payer: Medicare PPO

## 2022-05-19 ENCOUNTER — Encounter (HOSPITAL_BASED_OUTPATIENT_CLINIC_OR_DEPARTMENT_OTHER): Payer: Self-pay

## 2022-05-19 DIAGNOSIS — R0789 Other chest pain: Secondary | ICD-10-CM

## 2022-05-19 DIAGNOSIS — R079 Chest pain, unspecified: Secondary | ICD-10-CM | POA: Diagnosis not present

## 2022-05-19 LAB — CBC
HCT: 42.3 % (ref 36.0–46.0)
Hemoglobin: 14.1 g/dL (ref 12.0–15.0)
MCH: 29.6 pg (ref 26.0–34.0)
MCHC: 33.3 g/dL (ref 30.0–36.0)
MCV: 88.7 fL (ref 80.0–100.0)
Platelets: 260 10*3/uL (ref 150–400)
RBC: 4.77 MIL/uL (ref 3.87–5.11)
RDW: 13 % (ref 11.5–15.5)
WBC: 6.7 10*3/uL (ref 4.0–10.5)
nRBC: 0 % (ref 0.0–0.2)

## 2022-05-19 LAB — BASIC METABOLIC PANEL
Anion gap: 10 (ref 5–15)
BUN: 19 mg/dL (ref 8–23)
CO2: 25 mmol/L (ref 22–32)
Calcium: 9.4 mg/dL (ref 8.9–10.3)
Chloride: 102 mmol/L (ref 98–111)
Creatinine, Ser: 0.79 mg/dL (ref 0.44–1.00)
GFR, Estimated: >60 mL/min (ref >60)
Glucose, Bld: 99 mg/dL (ref 70–99)
Potassium: 4.3 mmol/L (ref 3.5–5.1)
Sodium: 137 mmol/L (ref 135–145)

## 2022-05-19 LAB — TROPONIN I (HIGH SENSITIVITY)
Troponin I (High Sensitivity): <2 ng/L (ref <18)
Troponin I (High Sensitivity): <2 ng/L (ref <18)

## 2022-05-19 NOTE — ED Provider Notes (Signed)
[facility] Provider Note   CSN: [id number] Arrival date & time: 05/19/22  1145     History  Chief Complaint  Patient presents with   Chest Pain    ARILYNN [name] is a 72 y.o. female presenting to the emergency department with chest pain.  Patient reports that she had an ibuprofen this morning along with a protein smoothie around 9:00.  Sometime around 11 AM while she was grocery shopping she began to have pressure in the middle of her chest, which felt like a squeezing sensation in a band around the middle of her chest rating up towards her throat.  This lasted about 30 minutes and has completely resolved since arriving in the hospital.  She says she felt "unwell".  But no report of lightheadedness or vomiting.  She denies prior issues like this before.  She reports that she only recently started taking ibuprofen due to chronic pain in her legs, and today was her third day of taking a small dose.  She has an extensive history of autoimmune disease, with allergies and adverse reactions to multiple medications.  External records reviewed the patient had a left heart catheterization 2018 with no significant coronary issues  HPI     Home Medications Prior to Admission medications   Medication Sig Start Date End Date Taking? Authorizing Provider  b complex vitamins tablet Take 1 tablet by mouth daily. Patient not taking: Reported on 12/22/2021    [provider]  Cholecalciferol (VITAMIN D-3) 25 MCG (1000 UT) CAPS Take 1 capsule by mouth daily.    [provider]  guaiFENesin (MUCINEX) 600 MG 12 hr tablet Take by mouth as needed. Patient not taking: Reported on 12/22/2021    [provider]  levocetirizine (XYZAL) 5 MG tablet Take 5 mg by mouth daily as needed for allergies.     [provider]  loratadine (CLARITIN) 10 MG tablet Take 10 mg by mouth daily as needed for allergies.    [provider]   Milk Thistle 250 MG CAPS Take 1 capsule by mouth 2 (two) times daily.    [provider]  Multiple Vitamins-Minerals (ICAPS PO) Take 1 capsule by mouth 2 (two) times daily.    [provider]  omeprazole (PRILOSEC) 20 MG capsule Take 20 mg by mouth daily.    [provider]  propranolol (INDERAL) 10 MG tablet Take 10 mg by mouth 3 (three) times daily.    [provider]  psyllium (HYDROCIL/METAMUCIL) 95 % PACK Take 1 packet by mouth 2 (two) times daily.    [provider]      Allergies    Ace inhibitors, Amoxicillin, Azathioprine, Celebrex [celecoxib], Crestor [rosuvastatin], Enalapril, Fentanyl, Fish oil, Forteo [parathyroid hormone (recomb)], Gabapentin, Lanolin acid [lanolin], Levofloxacin, Maxzide [triamterene-hctz], Methotrexate derivatives, Nickel, Other, Penicillins, Prednisone, Propoxyphene hcl, Sulfonamide derivatives, Synvisc [hylan g-f 20], Tramadol, and Sulfa antibiotics    Review of Systems   Review of Systems  Physical Exam Updated Vital Signs BP (!) 162/83 (BP Location: Right Arm)   Pulse 65   Temp 97.8 F (36.6 C) (Oral)   Resp 14   Ht '5\' 2"'$  (1.575 m)   Wt 84.4 kg   SpO2 100%   BMI 34.02 kg/m  Physical Exam Constitutional:      General: She is not in acute distress. HENT:     Head: Normocephalic and atraumatic.  Eyes:     Conjunctiva/sclera: Conjunctivae normal.  Pupils: Pupils are equal, round, and reactive to light.  Cardiovascular:     Rate and Rhythm: Normal rate and regular rhythm.  Pulmonary:     Effort: Pulmonary effort is normal. No respiratory distress.  Abdominal:     General: There is no distension.     Tenderness: There is no abdominal tenderness.  Skin:    General: Skin is warm and dry.  Neurological:     General: No focal deficit present.     Mental Status: She is alert. Mental status is at baseline.  Psychiatric:        Mood and Affect: Mood normal.        Behavior: Behavior normal.      ED Results / Procedures / Treatments   Labs (all labs ordered are listed, but only abnormal results are displayed) Labs Reviewed  BASIC METABOLIC PANEL  CBC  TROPONIN I (HIGH SENSITIVITY)  TROPONIN I (HIGH SENSITIVITY)    EKG EKG Interpretation  Date/Time:  Friday May 19 2022 11:52:27 EST Ventricular Rate:  71 PR Interval:  156 QRS Duration: 96 QT Interval:  391 QTC Calculation: 425 R Axis:   68 Text Interpretation: Sinus rhythm Confirmed by Octaviano Glow [phone]) on 05/19/2022 12:30:00 PM  Radiology DG Chest Portable 1 View  Result Date: 05/19/2022 CLINICAL DATA:  Chest pain EXAM: PORTABLE CHEST 1 VIEW COMPARISON:  09/16/2016 FINDINGS: Hyperinflation. Calcified aorta. Normal cardiopericardial silhouette. No consolidation, pneumothorax or effusion. No edema. Mild degenerative changes are seen of the spine. Overlapping cardiac leads. IMPRESSION: Hyperinflation.  No acute cardiopulmonary disease. Electronically Signed   By: Jill Side M.D.   On: 05/19/2022 12:19    Procedures Procedures    Medications Ordered in ED Medications - No data to display  ED Course/ Medical Decision Making/ A&P Clinical Course as of 05/19/22 1451  Fri May 19, 2022  1449 Patient remains asymptomatic in my reassessment.  Her repeat troponin is undetectable.  At this point I think she is reasonably safe and stable for discharge home with her husband. [MT]    Clinical Course User Index [MT] Serigne Kubicek, Carola Rhine, MD                             Medical Decision Making Amount and/or Complexity of Data Reviewed Labs: ordered. Radiology: ordered.   This patient presents to the Emergency Department with complaint of chest pain. This involves an extensive number of treatment options, and is a complaint that carries with it a high risk of complications and morbidity, given the patient's comorbidity, including hypertension.The differential diagnosis includes ACS vs Pneumothorax vs Reflux/Gastritis  vs MSK pain vs Pneumonia vs other.  Reflux gastritis is very much a possibility.  She suffers from this in the past, and has recently started taking NSAIDs such as ibuprofen including this morning.  I felt PE was less likely given that she has no tachycardia, no hypoxia, no other acute risk factors for PE  I ordered, reviewed, and interpreted labs.  Pertinent results include delta troponins are negative, undetectable, no other emergent findings noted on workup Patient is not currently having any discomfort to warrant medications. I ordered imaging studies which included x-ray of the chest I independently visualized and interpreted imaging which showed no emergent findings and the monitor tracing which showed sinus rhythm. I agree with the radiologist interpretation Additional history was obtained from the patient's husband at bedside External records obtained and reviewed showing left heart catheterization  report from 2018 I personally reviewed the patients ECG which showed sinus rhythm with no acute ischemic findings  After the interventions stated above, I reevaluated the patient and found that they were asymptomatic during her stay in the ER  Based on the patient's clinical exam, vital signs, risk factors, and ED testing, I felt that the patient's overall risk of life-threatening emergency such as ACS, PE, sepsis, or infection was low.  At this time, I felt the patient's presentation was most clinically consistent with nonspecific chest pain, but explained to the patient that this evaluation was not a definitive diagnostic workup.  I discussed outpatient follow up with primary care provider, and provided specialist office number on the patient's discharge paper if a referral was deemed necessary.  Return precautions were discussed with the patient.  I felt the patient was clinically stable for discharge.         Final Clinical Impression(s) / ED Diagnoses Final diagnoses:  Chest  discomfort    Rx / DC Orders ED Discharge Orders     None         Wyvonnia Dusky, MD 05/19/22 1451

## 2022-05-19 NOTE — ED Triage Notes (Signed)
Patient here POV from Home.  Endorses CP that began 30 Minutes ago. Across Chest and Central Chest. Tight in [place] and [place] in Intensity. Has since subsided.   Some SOB Initially.   NAD Noted during Triage. A&Ox4. GCS 15. Ambulatory.

## 2022-05-19 NOTE — Discharge Instructions (Signed)
You were diagnosed with chest pain today.  This is a non-specific diagnosis, but your provider did not feel this was a life-threatening condition at this time.  Chest pain is a common presenting condition in the Emergency Department, and it is not uncommon for patients to leave without a specific diagnosis.  It is important to remember that your workup today was not a complete medical workup.  You may still have a serious medical attention that needs follow up care with a specialist. If your provider referred you to see a specialist, it is VERY important that you call to set up an appointment with them.    It is also important that you speak to your primary care provider in 1-2 days after this visit to the ER.  Your PCP may want to see you in the office, or else they may want you to follow up with the specialist.  SEEK IMMEDIATE MEDICAL ATTENTION IF:  You have severe chest pain, especially if the pain is crushing or pressure-like and spreads to the arms, back, neck, or jaw, or if you have sweating, nausea (feeling sick to your stomach), or shortness of breath. THIS IS AN EMERGENCY. Don't wait to see if the pain will go away. Get medical help at once. Call 911 or 0 (operator). DO NOT drive yourself to the hospital.   Your chest pain gets worse and does not go away with rest.  You have an attack of chest pain lasting longer than usual, despite rest and treatment with the medications your caregiver has prescribed.  You wake from sleep with chest pain or shortness of breath.  You feel dizzy or faint.  You have chest pain not typical of your usual pain for which you originally saw your caregiver.  

## 2022-05-19 NOTE — ED Notes (Signed)
Denies chest pain at present.

## 2022-05-23 DIAGNOSIS — G4733 Obstructive sleep apnea (adult) (pediatric): Secondary | ICD-10-CM | POA: Diagnosis not present

## 2022-05-23 DIAGNOSIS — M818 Other osteoporosis without current pathological fracture: Secondary | ICD-10-CM | POA: Diagnosis not present

## 2022-05-23 DIAGNOSIS — G250 Essential tremor: Secondary | ICD-10-CM | POA: Diagnosis not present

## 2022-05-23 DIAGNOSIS — E559 Vitamin D deficiency, unspecified: Secondary | ICD-10-CM | POA: Diagnosis not present

## 2022-05-23 DIAGNOSIS — I10 Essential (primary) hypertension: Secondary | ICD-10-CM | POA: Diagnosis not present

## 2022-05-23 DIAGNOSIS — R7303 Prediabetes: Secondary | ICD-10-CM | POA: Diagnosis not present

## 2022-05-23 DIAGNOSIS — F330 Major depressive disorder, recurrent, mild: Secondary | ICD-10-CM | POA: Diagnosis not present

## 2022-05-23 DIAGNOSIS — Z79899 Other long term (current) drug therapy: Secondary | ICD-10-CM | POA: Diagnosis not present

## 2022-05-23 DIAGNOSIS — Z0000 Encounter for general adult medical examination without abnormal findings: Secondary | ICD-10-CM | POA: Diagnosis not present

## 2022-06-14 ENCOUNTER — Ambulatory Visit: Payer: Medicare PPO | Admitting: Neurology

## 2022-06-21 DIAGNOSIS — Z961 Presence of intraocular lens: Secondary | ICD-10-CM | POA: Diagnosis not present

## 2022-06-21 DIAGNOSIS — H26492 Other secondary cataract, left eye: Secondary | ICD-10-CM | POA: Diagnosis not present

## 2022-07-04 DIAGNOSIS — I6523 Occlusion and stenosis of bilateral carotid arteries: Secondary | ICD-10-CM | POA: Diagnosis not present

## 2022-07-04 DIAGNOSIS — I272 Pulmonary hypertension, unspecified: Secondary | ICD-10-CM | POA: Diagnosis not present

## 2022-07-04 DIAGNOSIS — R0789 Other chest pain: Secondary | ICD-10-CM | POA: Diagnosis not present

## 2022-07-04 DIAGNOSIS — R109 Unspecified abdominal pain: Secondary | ICD-10-CM | POA: Diagnosis not present

## 2022-07-04 DIAGNOSIS — G720 Drug-induced myopathy: Secondary | ICD-10-CM | POA: Diagnosis not present

## 2022-07-04 DIAGNOSIS — F331 Major depressive disorder, recurrent, moderate: Secondary | ICD-10-CM | POA: Diagnosis not present

## 2022-07-06 ENCOUNTER — Other Ambulatory Visit: Payer: Self-pay | Admitting: Family Medicine

## 2022-07-06 DIAGNOSIS — R109 Unspecified abdominal pain: Secondary | ICD-10-CM

## 2022-07-13 DIAGNOSIS — H26492 Other secondary cataract, left eye: Secondary | ICD-10-CM | POA: Diagnosis not present

## 2022-07-19 ENCOUNTER — Ambulatory Visit: Payer: Medicare PPO | Admitting: Neurology

## 2022-07-20 DIAGNOSIS — R768 Other specified abnormal immunological findings in serum: Secondary | ICD-10-CM | POA: Diagnosis not present

## 2022-07-20 DIAGNOSIS — M2540 Effusion, unspecified joint: Secondary | ICD-10-CM | POA: Diagnosis not present

## 2022-07-20 DIAGNOSIS — M25551 Pain in right hip: Secondary | ICD-10-CM | POA: Diagnosis not present

## 2022-07-20 DIAGNOSIS — Z8669 Personal history of other diseases of the nervous system and sense organs: Secondary | ICD-10-CM | POA: Diagnosis not present

## 2022-07-20 DIAGNOSIS — M1611 Unilateral primary osteoarthritis, right hip: Secondary | ICD-10-CM | POA: Diagnosis not present

## 2022-07-20 DIAGNOSIS — M7631 Iliotibial band syndrome, right leg: Secondary | ICD-10-CM | POA: Diagnosis not present

## 2022-07-20 DIAGNOSIS — G8929 Other chronic pain: Secondary | ICD-10-CM | POA: Diagnosis not present

## 2022-07-20 DIAGNOSIS — M1711 Unilateral primary osteoarthritis, right knee: Secondary | ICD-10-CM | POA: Diagnosis not present

## 2022-07-20 DIAGNOSIS — M170 Bilateral primary osteoarthritis of knee: Secondary | ICD-10-CM | POA: Diagnosis not present

## 2022-07-20 DIAGNOSIS — M25461 Effusion, right knee: Secondary | ICD-10-CM | POA: Diagnosis not present

## 2022-07-20 DIAGNOSIS — M5136 Other intervertebral disc degeneration, lumbar region: Secondary | ICD-10-CM | POA: Diagnosis not present

## 2022-07-20 DIAGNOSIS — Z889 Allergy status to unspecified drugs, medicaments and biological substances status: Secondary | ICD-10-CM | POA: Diagnosis not present

## 2022-07-20 DIAGNOSIS — M25561 Pain in right knee: Secondary | ICD-10-CM | POA: Diagnosis not present

## 2022-07-21 DIAGNOSIS — M79604 Pain in right leg: Secondary | ICD-10-CM | POA: Diagnosis not present

## 2022-07-21 DIAGNOSIS — M25551 Pain in right hip: Secondary | ICD-10-CM | POA: Diagnosis not present

## 2022-07-24 DIAGNOSIS — R768 Other specified abnormal immunological findings in serum: Secondary | ICD-10-CM | POA: Diagnosis not present

## 2022-07-24 DIAGNOSIS — M79604 Pain in right leg: Secondary | ICD-10-CM | POA: Diagnosis not present

## 2022-07-24 DIAGNOSIS — M1611 Unilateral primary osteoarthritis, right hip: Secondary | ICD-10-CM | POA: Diagnosis not present

## 2022-07-24 DIAGNOSIS — M25551 Pain in right hip: Secondary | ICD-10-CM | POA: Diagnosis not present

## 2022-07-24 DIAGNOSIS — G8929 Other chronic pain: Secondary | ICD-10-CM | POA: Diagnosis not present

## 2022-07-24 DIAGNOSIS — M2540 Effusion, unspecified joint: Secondary | ICD-10-CM | POA: Diagnosis not present

## 2022-07-24 DIAGNOSIS — M25561 Pain in right knee: Secondary | ICD-10-CM | POA: Diagnosis not present

## 2022-07-25 DIAGNOSIS — M1711 Unilateral primary osteoarthritis, right knee: Secondary | ICD-10-CM | POA: Diagnosis not present

## 2022-08-02 ENCOUNTER — Ambulatory Visit
Admission: RE | Admit: 2022-08-02 | Discharge: 2022-08-02 | Disposition: A | Discharge status: Home / Self Care | Payer: Medicare PPO | Source: Ambulatory Visit | Attending: Family Medicine | Admitting: Family Medicine

## 2022-08-02 ENCOUNTER — Other Ambulatory Visit: Payer: Self-pay | Admitting: *Deleted

## 2022-08-02 DIAGNOSIS — R1011 Right upper quadrant pain: Secondary | ICD-10-CM | POA: Diagnosis not present

## 2022-08-02 DIAGNOSIS — R109 Unspecified abdominal pain: Secondary | ICD-10-CM

## 2022-08-02 DIAGNOSIS — K769 Liver disease, unspecified: Secondary | ICD-10-CM | POA: Diagnosis not present

## 2022-08-02 DIAGNOSIS — M79604 Pain in right leg: Secondary | ICD-10-CM

## 2022-08-02 DIAGNOSIS — K8020 Calculus of gallbladder without cholecystitis without obstruction: Secondary | ICD-10-CM | POA: Diagnosis not present

## 2022-08-03 ENCOUNTER — Ambulatory Visit (HOSPITAL_COMMUNITY)
Admission: RE | Admit: 2022-08-03 | Discharge: 2022-08-03 | Disposition: A | Discharge status: Home / Self Care | Payer: Medicare PPO | Source: Ambulatory Visit | Attending: Vascular Surgery | Admitting: Vascular Surgery

## 2022-08-03 DIAGNOSIS — M79605 Pain in left leg: Secondary | ICD-10-CM

## 2022-08-03 DIAGNOSIS — M79604 Pain in right leg: Secondary | ICD-10-CM

## 2022-08-03 LAB — VAS US ABI WITH/WO TBI
Left ABI: 1.04
Right ABI: 1.11

## 2022-08-08 ENCOUNTER — Encounter: Payer: Self-pay | Admitting: Vascular Surgery

## 2022-08-08 ENCOUNTER — Ambulatory Visit: Payer: Medicare PPO | Admitting: Vascular Surgery

## 2022-08-08 VITALS — BP 123/83 | HR 73 | Temp 97.8°F | Resp 14 | Ht 62.0 in | Wt 190.0 lb

## 2022-08-08 DIAGNOSIS — M79604 Pain in right leg: Secondary | ICD-10-CM | POA: Diagnosis not present

## 2022-08-08 DIAGNOSIS — M79606 Pain in leg, unspecified: Secondary | ICD-10-CM | POA: Insufficient documentation

## 2022-08-08 NOTE — Progress Notes (Signed)
Patient name: Barbara [name] MRN: [id number] DOB: 1950/08/14 Sex: female  REASON FOR CONSULT: PAD   HPI: Barbara [name] is a 72 y.o. female, with hx HTN, HLD, meningioma presents for evaluation of possible PAD.  Patient describes pain in the right leg for the last several years that is radiating up into her thigh and down into her calf.  She experiences this whether she is walking or sitting still even through the night.  She associates this with initiating statin therapy several years ago.  She recently went to [facility] clinic and was told she needed a knee replacement and likely had a Baker's cyst.  She states she saw a neurologist there that also raised the question of arterial disease.  She had ABIs done here.  She is seeing Dr. Eulah Pont and is getting injections in her right knee with some help and improvement.  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, nickel  03/31/2017   Anxiety    Arthritis    knees   Cancer (HCC)    basal cell- nose   GERD (gastroesophageal reflux disease)    Hyperlipidemia    Hypertension    Meningioma (HCC)    Neuromuscular disorder (HCC)    benign tremor- takes Propanolol   PONV (postoperative nausea and vomiting)    "BP DROPS ALSO"   Trigeminal neuralgia of right side of face     Past Surgical History:  Procedure Laterality Date   69 HOUR PH STUDY N/A 04/17/2016   Procedure: 24 HOUR PH STUDY;  Surgeon: Ruffin Frederick, MD;  Location: [facility];  Service: Gastroenterology;  Laterality: N/A;   ESOPHAGEAL MANOMETRY N/A 04/17/2016   Procedure: ESOPHAGEAL MANOMETRY (EM);  Surgeon: Ruffin Frederick, MD;  Location: [facility];  Service: Gastroenterology;  Laterality: N/A;   EXTERNAL FIXATION LEG Left 03/28/2017   [place]   EXTERNAL FIXATION LEG Left 03/28/2017   Procedure: EXTERNAL FIXATION LEG;  Surgeon: Sheral Apley, MD;  Location: [facility] OR;  Service: Orthopedics;  Laterality: Left;   EXTERNAL FIXATION REMOVAL Left 05/08/2017    Procedure: REMOVAL EXTERNAL FIXATION LEFT  LEG WITH HARDWARE REMOVAL;  Surgeon: Sheral Apley, MD;  Location: [facility];  Service: Orthopedics;  Laterality: Left;   fractured femur  05/2012   left - rod -screws placed   HARDWARE REMOVAL Left 04/16/2013   Procedure: REMOVAL OF HARDWARE OF LEFT KNEE (DISTAL INTERLOC SCREW);  Surgeon: Loanne Drilling, MD;  Location: [facility]S;  Service: Orthopedics;  Laterality: Left;   KNEE SURGERY     x 3 left / x1 rt knee   LEFT HEART CATH AND CORONARY ANGIOGRAPHY N/A 09/18/2016   Procedure: Left Heart Cath and Coronary Angiography;  Surgeon: Tonny Bollman, MD;  Location: [facility] INVASIVE CV LAB;  Service: Cardiovascular;  Laterality: N/A;   NASAL SINUS SURGERY     2000   OTHER SURGICAL HISTORY  2014   titanium rod in left femur used for fracture repair   SHOULDER SURGERY     bilateral shoulders    WISDOM TOOTH EXTRACTION      Family History  Problem Relation Age of Onset   Stomach cancer Mother    Heart disease Mother 21       First MI early 41s, CABG    Lung cancer Father        smoked   Allergies Father    Heart attack Father 67   Brain cancer Brother  Colon cancer Neg Hx    Esophageal cancer Neg Hx    Rectal cancer Neg Hx     SOCIAL HISTORY: Social History   Socioeconomic History   Marital status: Married    Spouse name: Not on file   Number of children: 2   Years of education: college   Highest education level: Bachelor's degree (e.g., BA, AB, BS)  Occupational History   Occupation: retired Runner, broadcasting/film/video  Tobacco Use   Smoking status: Never   Smokeless tobacco: Never  Vaping Use   Vaping Use: Never used  Substance and Sexual Activity   Alcohol use: Yes    Comment: RARE   Drug use: No   Sexual activity: Not on file  Other Topics Concern   Not on file  Social History Narrative   Lives at home at home with husband. In a one story home   Right-handed.   No caffeine use.   Social Determinants of Health    Financial Resource Strain: Not on file  Food Insecurity: Not on file  Transportation Needs: Not on file  Physical Activity: Not on file  Stress: Not on file  Social Connections: Not on file  Intimate Partner Violence: Not on file    Allergies  Allergen Reactions   Ace Inhibitors     Pt unsure- nausea or hives   Amoxicillin Hives   Azathioprine Other (See Comments)    "cardiac issues"   Celebrex [Celecoxib] Other (See Comments)    Severe stomach cramps   Crestor [Rosuvastatin] Other (See Comments)    Bilat leg/heel pains/swelling   Enalapril Other (See Comments)    congestion   Fentanyl Nausea And Vomiting   Fish Oil Other (See Comments)    Broke out with blisters   Forteo [Parathyroid Hormone (Recomb)]    Gabapentin     Pain and swelling    Lanolin Acid [Lanolin]     rash   Levofloxacin Other (See Comments)    "sore ankles"   Maxzide [Triamterene-Hctz] Other (See Comments)    Knee, ankles and back pain   Methotrexate Derivatives Other (See Comments)    "cardiac issues"   Nickel     rash   Other Nausea And Vomiting    Narcotic Pain Killers- vomitting, nausea constantly   Penicillins Hives    Has patient had a PCN reaction causing immediate rash, facial/tongue/throat swelling, SOB or lightheadedness with hypotension: unknown Has patient had a PCN reaction causing severe rash involving mucus membranes or skin necrosis: no Has patient had a PCN reaction that required hospitalization - no, was at MD office Has patient had a PCN reaction occurring within the last 10 years: no If all of the above answers are "NO", then may proceed with Cephalosporin use.    Prednisone Other (See Comments)    High blood pressure.   Propoxyphene Hcl Nausea Only   Sulfonamide Derivatives Nausea Only and Other (See Comments)    Stomach cramping   Synvisc [Hylan G-F 20]     Made knee swell up at injection site   Tramadol    Sulfa Antibiotics Nausea Only, Rash and Nausea And Vomiting     Current Outpatient Medications  Medication Sig Dispense Refill   b complex vitamins tablet Take 1 tablet by mouth daily.     Cholecalciferol (VITAMIN D-3) 25 MCG (1000 UT) CAPS Take 1 capsule by mouth daily.     guaiFENesin (MUCINEX) 600 MG 12 hr tablet Take by mouth as needed.     levocetirizine (  XYZAL) 5 MG tablet Take 5 mg by mouth daily as needed for allergies.      loratadine (CLARITIN) 10 MG tablet Take 10 mg by mouth daily as needed for allergies.     Milk Thistle 250 MG CAPS Take 1 capsule by mouth 2 (two) times daily.     Multiple Vitamins-Minerals (ICAPS PO) Take 1 capsule by mouth 2 (two) times daily.     omeprazole (PRILOSEC) 20 MG capsule Take 20 mg by mouth daily.     propranolol (INDERAL) 10 MG tablet Take 10 mg by mouth 3 (three) times daily.     psyllium (HYDROCIL/METAMUCIL) 95 % PACK Take 1 packet by mouth 2 (two) times daily.     No current facility-administered medications for this visit.    REVIEW OF SYSTEMS:  [X]  denotes positive finding, [ ]  denotes negative finding Cardiac  Comments:  Chest pain or chest pressure:    Shortness of breath upon exertion:    Short of breath when lying flat:    Irregular heart rhythm:        Vascular    Pain in calf, thigh, or hip brought on by ambulation:    Pain in feet at night that wakes you up from your sleep:     Blood clot in your veins:    Leg swelling:         Pulmonary    Oxygen at home:    Productive cough:     Wheezing:         Neurologic    Sudden weakness in arms or legs:     Sudden numbness in arms or legs:     Sudden onset of difficulty speaking or slurred speech:    Temporary loss of vision in one eye:     Problems with dizziness:         Gastrointestinal    Blood in stool:     Vomited blood:         Genitourinary    Burning when urinating:     Blood in urine:        Psychiatric    Major depression:         Hematologic    Bleeding problems:    Problems with blood clotting too easily:         Skin    Rashes or ulcers:        Constitutional    Fever or chills:      PHYSICAL EXAM: Vitals:   08/08/22 1501  BP: 123/83  Pulse: 73  Resp: 14  Temp: 97.8 F (36.6 C)  TempSrc: Temporal  SpO2: 97%  Weight: 190 lb (86.2 kg)  Height: 5\' 2"  (1.575 m)    GENERAL: The patient is a well-nourished female, in no acute distress. The vital signs are documented above. CARDIAC: There is a regular rate and rhythm.  VASCULAR:  Bilateral femoral pulses palpable Bilateral PT pulses palpable No lower extremity tissue loss PULMONARY: No respiratory distress. ABDOMEN: Soft and non-tender. MUSCULOSKELETAL: There are no major deformities or cyanosis. NEUROLOGIC: No focal weakness or paresthesias are detected. SKIN: There are no ulcers or rashes noted. PSYCHIATRIC: The patient has a normal affect.  DATA:   ABI's 08/03/2022 1.11 on the right multiphasic and 1.04 on the left multiphasic  Assessment/Plan:  72 y.o. female, with hx HTN, HLD, meningioma presents for evaluation of possible PAD.  Patient describes pain in the right leg for the last several years that is radiating up into her  thigh and down into her calf.  I discussed I do not think her symptoms are related to significant arterial insufficiency.  She has a palpable posterior tibial pulse at the ankle.  Her ABIs are normal on the right with multiphasic waveform at the ankle and pulsatile toe tracings.  She has no classic claudication symptoms or rest pain or tissue loss to suggest critical limb ischemia.  Happy to see her as needed in the future.   Cephus Shelling, MD [facility] Office: [phone]

## 2022-08-09 DIAGNOSIS — M1711 Unilateral primary osteoarthritis, right knee: Secondary | ICD-10-CM | POA: Diagnosis not present

## 2022-08-14 ENCOUNTER — Telehealth: Payer: Self-pay | Admitting: Internal Medicine

## 2022-08-14 NOTE — Telephone Encounter (Signed)
Patient wants a provider switch from Dr. Tenny Craw to Dr. Cristal Deer at [facility] as this is closer to her home.

## 2022-08-30 ENCOUNTER — Ambulatory Visit (HOSPITAL_BASED_OUTPATIENT_CLINIC_OR_DEPARTMENT_OTHER): Payer: Medicare PPO | Admitting: Physical Therapy

## 2022-08-30 ENCOUNTER — Ambulatory Visit: Payer: Medicare PPO | Admitting: Neurology

## 2022-08-31 ENCOUNTER — Ambulatory Visit (HOSPITAL_BASED_OUTPATIENT_CLINIC_OR_DEPARTMENT_OTHER): Payer: Medicare PPO | Attending: Orthopedic Surgery | Admitting: Physical Therapy

## 2022-08-31 ENCOUNTER — Encounter (HOSPITAL_BASED_OUTPATIENT_CLINIC_OR_DEPARTMENT_OTHER): Payer: Self-pay | Admitting: Physical Therapy

## 2022-08-31 ENCOUNTER — Other Ambulatory Visit: Payer: Self-pay

## 2022-08-31 DIAGNOSIS — M79604 Pain in right leg: Secondary | ICD-10-CM | POA: Insufficient documentation

## 2022-08-31 DIAGNOSIS — R262 Difficulty in walking, not elsewhere classified: Secondary | ICD-10-CM | POA: Insufficient documentation

## 2022-08-31 DIAGNOSIS — M25661 Stiffness of right knee, not elsewhere classified: Secondary | ICD-10-CM | POA: Insufficient documentation

## 2022-08-31 DIAGNOSIS — M6281 Muscle weakness (generalized): Secondary | ICD-10-CM | POA: Insufficient documentation

## 2022-08-31 NOTE — Therapy (Signed)
OUTPATIENT PHYSICAL THERAPY LOWER EXTREMITY EVALUATION   Patient Name: Barbara [name] MRN: [id number] DOB:15-Sep-1950, 72 y.o., female Today's Date: 08/31/2022  END OF SESSION:  PT End of Session - 08/31/22 1157     Visit Number 1    Number of Visits 13    Date for PT Re-Evaluation 10/12/22    Authorization Type [employer] M[employer]    Authorization Time Period 08/31/22 to 10/12/22    Progress Note Due on Visit 10    PT Start Time 1014    PT Stop Time 1056    PT Time Calculation (min) 42 min    Activity Tolerance Patient tolerated treatment well    Behavior During Therapy [facility] for tasks assessed/performed             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, nickel  03/31/2017   Anxiety    Arthritis    knees   Cancer (HCC)    basal cell- nose   GERD (gastroesophageal reflux disease)    Hyperlipidemia    Hypertension    Meningioma (HCC)    Neuromuscular disorder (HCC)    benign tremor- takes Propanolol   PONV (postoperative nausea and vomiting)    "BP DROPS ALSO"   Trigeminal neuralgia of right side of face    Past Surgical History:  Procedure Laterality Date   23 HOUR PH STUDY N/A 04/17/2016   Procedure: 24 HOUR PH STUDY;  Surgeon: Ruffin Frederick, MD;  Location: [facility];  Service: Gastroenterology;  Laterality: N/A;   ESOPHAGEAL MANOMETRY N/A 04/17/2016   Procedure: ESOPHAGEAL MANOMETRY (EM);  Surgeon: Ruffin Frederick, MD;  Location: [facility];  Service: Gastroenterology;  Laterality: N/A;   EXTERNAL FIXATION LEG Left 03/28/2017   [place]   EXTERNAL FIXATION LEG Left 03/28/2017   Procedure: EXTERNAL FIXATION LEG;  Surgeon: Sheral Apley, MD;  Location: [facility] OR;  Service: Orthopedics;  Laterality: Left;   EXTERNAL FIXATION REMOVAL Left 05/08/2017   Procedure: REMOVAL EXTERNAL FIXATION LEFT  LEG WITH HARDWARE REMOVAL;  Surgeon: Sheral Apley, MD;  Location: [facility];  Service: Orthopedics;  Laterality: Left;   fractured  femur  05/2012   left - rod -screws placed   HARDWARE REMOVAL Left 04/16/2013   Procedure: REMOVAL OF HARDWARE OF LEFT KNEE (DISTAL INTERLOC SCREW);  Surgeon: Loanne Drilling, MD;  Location: [facility];  Service: Orthopedics;  Laterality: Left;   KNEE SURGERY     x 3 left / x1 rt knee   LEFT HEART CATH AND CORONARY ANGIOGRAPHY N/A 09/18/2016   Procedure: Left Heart Cath and Coronary Angiography;  Surgeon: Tonny Bollman, MD;  Location: [facility] INVASIVE CV LAB;  Service: Cardiovascular;  Laterality: N/A;   NASAL SINUS SURGERY     2000   OTHER SURGICAL HISTORY  2014   titanium rod in left femur used for fracture repair   SHOULDER SURGERY     bilateral shoulders    WISDOM TOOTH EXTRACTION     Patient Active Problem List   Diagnosis Date Noted   Leg pain 08/08/2022   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, nickel  03/31/2017   Closed left tibial fracture 03/28/2017   Closed fracture of left tibial plateau 03/28/2017   Nonintractable headache 01/25/2017   Visual field defect    Chest pain 09/16/2016   Essential tremor 09/16/2016   Right facial numbness 09/16/2016   Bilateral scleritis 08/29/2016   Obesity 06/01/2016   Dyspnea on exertion 05/13/2016   Hoarseness 03/17/2016  Gastroesophageal reflux disease without esophagitis 03/17/2016   Globus sensation 03/17/2016   Gastritis 02/25/2016   Atypical chest pain 02/24/2016   Painful orthopaedic hardware (HCC) 04/15/2013   Vitamin D deficiency 12/09/2009   HYPERCHOLESTEROLEMIA 12/09/2009   Essential hypertension 12/09/2009   Allergic rhinitis 12/09/2009   NEPHROLITHIASIS 12/09/2009   CERVICAL POLYP 12/09/2009   Osteoarthritis 12/09/2009   Cough variant asthma vs UACS  12/09/2009    PCP: Mila Palmer MD   REFERRING PROVIDER: Sheral Apley, MD  REFERRING DIAG: Right knee pain  THERAPY DIAG:  Pain in right leg  Stiffness of right knee, not elsewhere classified  Muscle weakness (generalized)  Difficulty in walking, not elsewhere  classified  Rationale for Evaluation and Treatment: Rehabilitation  ONSET DATE: about a year   SUBJECTIVE:   SUBJECTIVE STATEMENT: Dr. Eulah Pont and I have decided that we've ruled out all these different things and that it won't hurt to attack it with PT. I went to the [facility] and they couldn't figure out what was wrong, I wasn't impressed bc they told me I had a baker's cyst without doing a test for it. Dr. Eulah Pont did a test and I don't have a baker's cyst. The doctor told me I don't have enough fluid in the knee to draw off. Then we did a cortisone shot, I usually hate cortisone shots because it makes my pain worse, we tried it and it didn't help. We thought it was vascular at first and the ABI was fine. The pain is not in my knee, its down the back of my leg. I was here before working with Thurston Pounds for this same thing, at first it got better but then the pain got worse. I've tried statins before but I stopped them because they made my leg hurt and I developed large knots on the ends of each heel.   PERTINENT HISTORY: Nickel allergy, OA, skin CA, HLD, HTN, meningioma, benign tumor, hx trigeminal neuralgia, hx femur fx 2014 and multiple placements and removal of hardware in subsequent years, shoulder surgery, knee surgery  PAIN:  Are you having pain? Yes: NPRS scale: 2-3/10 Pain location: (pointed to back of leg in general/proximal hams R LE) Pain description: ache  Aggravating factors: walking and WB  Relieving factors: elevating LE   PRECAUTIONS: Other: nickel allergy   WEIGHT BEARING RESTRICTIONS: No  FALLS:  Has patient fallen in last 6 months? No  LIVING ENVIRONMENT: Lives with: lives with their spouse Lives in: House/apartment Stairs: 2 STE in garage, 4 STE B rails; no steps inside home  Has following equipment at home: Single point cane  OCCUPATION: retired   PLOF: Independent, Independent with basic ADLs, Independent with gait, and Independent with transfers  PATIENT  GOALS: improve pain, be able to walk   NEXT MD VISIT: Dr. Eulah Pont sometimes this summer (unsure)  OBJECTIVE:     PATIENT SURVEYS:  FOTO will do 2nd session (pt very talkative at eval, time limited)  COGNITION: Overall cognitive status: Within functional limits for tasks assessed       MUSCLE LENGTH:  HS: Moderate limitation L, severe limitation R Piriformis: WNL L, moderate limitation R   Slump test negative  No significant LLD  POSTURE: rounded shoulders, forward head, decreased lumbar lordosis, increased thoracic kyphosis, and flexed trunk   PALPATION: No areas TTP R knee, posterior R LE   LOWER EXTREMITY ROM:  Active ROM Right eval Left eval  Hip flexion    Hip extension    Hip  abduction    Hip adduction    Hip internal rotation    Hip external rotation    Knee flexion 105* 120*  Knee extension 17* 10*  Ankle dorsiflexion    Ankle plantarflexion    Ankle inversion    Ankle eversion     (Blank rows = not tested)  Lumbar flexion WNL, RFIS causes some "I don't like you pain" but nothing beyond normal standing pain  Lumbar extension moderate limitation  Lumbar lateral flexion moderate limitation    LOWER EXTREMITY MMT:  MMT Right eval Left eval  Hip flexion 5 5  Hip extension Unable to tolerate prone  Unable to tolerate prone   Hip abduction 4+ 4+  Hip adduction    Hip internal rotation    Hip external rotation    Knee flexion 5 5  Knee extension 5 5  Ankle dorsiflexion 5 5  Ankle plantarflexion    Ankle inversion    Ankle eversion     (Blank rows = not tested)    GAIT: Distance walked: in clinic distances  Assistive device utilized: None Level of assistance: Complete Independence Comments: knee flexion in stance and swing phase B, flexed at hips, severe valgus L knee, limited dorsiflexion R ankle, short step lengths and stance times   TODAY'S TREATMENT:                                                                                                                               DATE:   Eval  Objective measures/appropriate education/care planning  Education on findings today- noted impairments seem very similar to course of care last year which did not give significant pain relief per her report, would like to try a different approach this time around. Would really like to try water PT and see if this is beneficial. General education on baker's cysts as well as presentation of lumbar involvement and sciatica (not present at eval today). Education that this is not necessarily related to a medical condition. Education that if water PT helps and she makes significant objective progress, we can certainly extend and plan on independent water exercise program, however will need to return to MD if she fails to make objective progress.       PATIENT EDUCATION:  Education details: exam findings, POC, see above for details  Person educated: Patient and Spouse Education method: Medical illustrator Education comprehension: verbalized understanding and returned demonstration  HOME EXERCISE PROGRAM: Has existing exercise program that addresses deficits found on PT eval- will continue with this for now   ASSESSMENT:  CLINICAL IMPRESSION: Patient is a 72 y.o. F who was seen today for physical therapy evaluation and treatment for R knee pain. Per patient report, her ortho MD has done a lot of "ruling things out" and is not sure of the source of her pain, referred her to PT for ongoing care. Very talkative and needed frequent redirection to eval related tasks and  questions from PT.  Exam reveals limited B knee ROM (R>L), impaired muscle flexibility, gait impairment, and ongoing knee pain. Per chart review, objective impairments are very similar those found  and addressed last summer during her course of care here. Screened lumbar spine, seems clear at this time/not involved as a source of pain. She is already working on lumbar ROM and  hamstring flexibility at home. I think the best option right now is to try water physical therapy here with frequent objective check-ins to make sure she is progressing.  OBJECTIVE IMPAIRMENTS: Abnormal gait, decreased activity tolerance, decreased mobility, difficulty walking, decreased ROM, increased muscle spasms, impaired flexibility, improper body mechanics, postural dysfunction, obesity, and pain.   ACTIVITY LIMITATIONS: standing, stairs, transfers, and locomotion level  PARTICIPATION LIMITATIONS: cleaning, laundry, community activity, yard work, and church  PERSONAL FACTORS: Age, Behavior pattern, Education, Fitness, Past/current experiences, Profession, and Time since onset of injury/illness/exacerbation are also affecting patient's functional outcome.   REHAB POTENTIAL: Fair- limited progress/pain relief from PT interventions last year for same body part, chronicity of pain   CLINICAL DECISION MAKING: Stable/uncomplicated  EVALUATION COMPLEXITY: Low   GOALS: Goals reviewed with patient? No  SHORT TERM GOALS: Target date: 09/21/2022   Will be compliant with appropriate progressive HEP  Baseline: Goal status: INITIAL  2.  Stance time and step lengths to be WNL and patient will be able to maintain upright posture with all gait based tasks  Baseline:  Goal status: INITIAL  3.  Muscle flexibility impairments to have improved by 50% in all groups  Baseline:  Goal status: INITIAL  4.  Will be able to stand and walk for at least 40 minutes without increase in pain  Baseline:  Goal status: INITIAL    LONG TERM GOALS: Target date: 10/12/2022    Will tolerate standing tasks for 75 minutes without increase in RLE pain  Baseline:  Goal status: INITIAL  2.  FOTO score to be within 5 points of predicted value to show improved subjective improvement  Baseline:  Goal status: INITIAL  3.  R knee flexion ROM to be equal to that of the left and extension ROM to be within 3  degrees of the L  Baseline:  Goal status: INITIAL  4.  Intensity of pain at rest to have improved by 50%  Baseline:  Goal status: INITIAL     PLAN:  PT FREQUENCY: 2x/week  PT DURATION: 6 weeks  PLANNED INTERVENTIONS: Therapeutic exercises, Therapeutic activity, Gait training, Self Care, Aquatic Therapy, Manual therapy, and Re-evaluation  PLAN FOR NEXT SESSION: water therapy focus with progression to land PT when appropriate- came to PT here last year with minimal improvement and similar impairments, may be at functional baseline- regular objective assessments to determine progress/lack of   Nedra Hai, PT, DPT 08/31/22 12:03 PM  Referring diagnosis? Right knee pain M25.561 Treatment diagnosis? (if different than referring diagnosis)   Pain in right leg M79.604  Stiffness of right knee, not elsewhere classified M25.661  Muscle weakness (generalized) M62.81  Difficulty in walking, not elsewhere classified R26.2  What was this (referring dx) caused by? []  Surgery []  Fall [x]  Ongoing issue []  Arthritis []  Other: ____________  Laterality: [x]  Rt []  Lt []  Both  Check all possible CPT codes:  *CHOOSE 10 OR LESS*    [x]  16109 (Therapeutic Exercise)  []  92507 (SLP Treatment)  []  97112 (Neuro Re-ed)   []  92526 (Swallowing Treatment)   [x]  60454 (Gait Training)   []  [id number] (Cognitive Training,  1st 15 minutes) [x]  97140 (Manual Therapy)   []  97130 (Cognitive Training, each add'l 15 minutes)  [x]  97164 (Re-evaluation)                              []  Other, List CPT Code ____________  [x]  97530 (Therapeutic Activities)     [x]  97535 (Self Care)   []  All codes above (97110 - 97535)  []  97012 (Mechanical Traction)  []  97014 (E-stim Unattended)  []  97032 (E-stim manual)  []  97033 (Ionto)  []  97035 (Ultrasound) []  13086 (Physical Performance Training) [x]  [id number] (Aquatic Therapy) []  97016 (Vasopneumatic Device) []  [id number] (Paraffin) []  97034 (Contrast Bath) []  97597  (Wound Care 1st 20 sq cm) []  97598 (Wound Care each add'l 20 sq cm) []  97760 (Orthotic Fabrication, Fitting, Training Initial) []  [id number] (Prosthetic Management and Training Initial) []  [phone] (Orthotic or Prosthetic Training/ Modification Subsequent)

## 2022-09-06 ENCOUNTER — Ambulatory Visit (HOSPITAL_BASED_OUTPATIENT_CLINIC_OR_DEPARTMENT_OTHER): Payer: Medicare PPO | Admitting: Physical Therapy

## 2022-09-06 ENCOUNTER — Encounter (HOSPITAL_BASED_OUTPATIENT_CLINIC_OR_DEPARTMENT_OTHER): Payer: Self-pay | Admitting: Physical Therapy

## 2022-09-06 DIAGNOSIS — R262 Difficulty in walking, not elsewhere classified: Secondary | ICD-10-CM

## 2022-09-06 DIAGNOSIS — M79604 Pain in right leg: Secondary | ICD-10-CM

## 2022-09-06 DIAGNOSIS — M6281 Muscle weakness (generalized): Secondary | ICD-10-CM

## 2022-09-06 DIAGNOSIS — M25661 Stiffness of right knee, not elsewhere classified: Secondary | ICD-10-CM | POA: Diagnosis not present

## 2022-09-06 NOTE — Therapy (Signed)
OUTPATIENT PHYSICAL THERAPY LOWER EXTREMITY EVALUATION   Patient Name: Barbara [name] MRN: [id number] DOB:12-27-1950, 72 y.o., female Today's Date: 09/06/2022  END OF SESSION:  PT End of Session - 09/06/22 1202     Visit Number 2    Number of Visits 13    Date for PT Re-Evaluation 10/12/22    Authorization Type [employer] M[employer]    Authorization Time Period 08/31/22 to 10/12/22    Progress Note Due on Visit 10    PT Start Time 1206    PT Stop Time 1245    PT Time Calculation (min) 39 min    Activity Tolerance Patient tolerated treatment well;Patient limited by pain    Behavior During Therapy [facility] for tasks assessed/performed             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, nickel  03/31/2017   Anxiety    Arthritis    knees   Cancer (HCC)    basal cell- nose   GERD (gastroesophageal reflux disease)    Hyperlipidemia    Hypertension    Meningioma (HCC)    Neuromuscular disorder (HCC)    benign tremor- takes Propanolol   PONV (postoperative nausea and vomiting)    "BP DROPS ALSO"   Trigeminal neuralgia of right side of face    Past Surgical History:  Procedure Laterality Date   65 HOUR PH STUDY N/A 04/17/2016   Procedure: 24 HOUR PH STUDY;  Surgeon: Ruffin Frederick, MD;  Location: [facility];  Service: Gastroenterology;  Laterality: N/A;   ESOPHAGEAL MANOMETRY N/A 04/17/2016   Procedure: ESOPHAGEAL MANOMETRY (EM);  Surgeon: Ruffin Frederick, MD;  Location: [facility];  Service: Gastroenterology;  Laterality: N/A;   EXTERNAL FIXATION LEG Left 03/28/2017   [place]   EXTERNAL FIXATION LEG Left 03/28/2017   Procedure: EXTERNAL FIXATION LEG;  Surgeon: Sheral Apley, MD;  Location: [facility] OR;  Service: Orthopedics;  Laterality: Left;   EXTERNAL FIXATION REMOVAL Left 05/08/2017   Procedure: REMOVAL EXTERNAL FIXATION LEFT  LEG WITH HARDWARE REMOVAL;  Surgeon: Sheral Apley, MD;  Location: [facility];  Service: Orthopedics;   Laterality: Left;   fractured femur  05/2012   left - rod -screws placed   HARDWARE REMOVAL Left 04/16/2013   Procedure: REMOVAL OF HARDWARE OF LEFT KNEE (DISTAL INTERLOC SCREW);  Surgeon: Loanne Drilling, MD;  Location: [facility];  Service: Orthopedics;  Laterality: Left;   KNEE SURGERY     x 3 left / x1 rt knee   LEFT HEART CATH AND CORONARY ANGIOGRAPHY N/A 09/18/2016   Procedure: Left Heart Cath and Coronary Angiography;  Surgeon: Tonny Bollman, MD;  Location: [facility] INV[facility]IVE CV [facility];  Service: Cardiovascular;  Laterality: N/A;   NASAL SINUS SURGERY     2000   OTHER SURGICAL HISTORY  2014   titanium rod in left femur used for fracture repair   SHOULDER SURGERY     bilateral shoulders    WISDOM TOOTH EXTRACTION     Patient Active Problem List   Diagnosis Date Noted   Leg pain 08/08/2022   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, nickel  03/31/2017   Closed left tibial fracture 03/28/2017   Closed fracture of left tibial plateau 03/28/2017   Nonintractable headache 01/25/2017   Visual field defect    Chest pain 09/16/2016   Essential tremor 09/16/2016   Right facial numbness 09/16/2016   Bilateral scleritis 08/29/2016   Obesity 06/01/2016   Dyspnea on exertion 05/13/2016  Hoarseness 03/17/2016   Gastroesophageal reflux disease without esophagitis 03/17/2016   Globus sensation 03/17/2016   Gastritis 02/25/2016   Atypical chest pain 02/24/2016   Painful orthopaedic hardware (HCC) 04/15/2013   Vitamin D deficiency 12/09/2009   HYPERCHOLESTEROLEMIA 12/09/2009   Essential hypertension 12/09/2009   Allergic rhinitis 12/09/2009   NEPHROLITHIASIS 12/09/2009   CERVICAL POLYP 12/09/2009   Osteoarthritis 12/09/2009   Cough variant asthma vs UACS  12/09/2009    PCP: Mila Palmer MD   REFERRING PROVIDER: Sheral Apley, MD  REFERRING DIAG: Right knee pain  THERAPY DIAG:  Pain in right leg  Stiffness of right knee, not elsewhere classified  Muscle weakness (generalized)  Difficulty in walking,  not elsewhere classified  Rationale for Evaluation and Treatment: Rehabilitation  ONSET DATE: about a year   SUBJECTIVE: "Pain in knee 6/10 today.  Have rehabed myself in a pool before."  SUBJECTIVE STATEMENT: Dr. Eulah Pont and I have decided that we've ruled out all these different things and that it won't hurt to attack it with PT. I went to the [facility] and they couldn't figure out what was wrong, I wasn't impressed bc they told me I had a baker's cyst without doing a test for it. Dr. Eulah Pont did a test and I don't have a baker's cyst. The doctor told me I don't have enough fluid in the knee to draw off. Then we did a cortisone shot, I usually hate cortisone shots because it makes my pain worse, we tried it and it didn't help. We thought it was vascular at first and the ABI was fine. The pain is not in my knee, its down the back of my leg. I was here before working with Thurston Pounds for this same thing, at first it got better but then the pain got worse. I've tried statins before but I stopped them because they made my leg hurt and I developed large knots on the ends of each heel.   PERTINENT HISTORY: Nickel allergy, OA, skin CA, HLD, HTN, meningioma, benign tumor, hx trigeminal neuralgia, hx femur fx 2014 and multiple placements and removal of hardware in subsequent years, shoulder surgery, knee surgery  PAIN:  Are you having pain? Yes: NPRS scale: 6/10 Pain location: (pointed to back of leg in general/proximal hams R LE) Pain description: ache  Aggravating factors: walking and WB  Relieving factors: elevating LE   PRECAUTIONS: Other: nickel allergy   WEIGHT BEARING RESTRICTIONS: No  FALLS:  Has patient fallen in last 6 months? No  LIVING ENVIRONMENT: Lives with: lives with their spouse Lives in: House/apartment Stairs: 2 STE in garage, 4 STE B rails; no steps inside home  Has following equipment at home: Single point cane  OCCUPATION: retired   PLOF: Independent, Independent with  basic ADLs, Independent with gait, and Independent with transfers  PATIENT GOALS: improve pain, be able to walk   NEXT MD VISIT: Dr. Eulah Pont sometimes this summer (unsure)  OBJECTIVE:     PATIENT SURVEYS:  FOTO will do 2nd session (pt very talkative at eval, time limited)  COGNITION: Overall cognitive status: Within functional limits for tasks assessed       MUSCLE LENGTH:  HS: Moderate limitation L, severe limitation R Piriformis: WNL L, moderate limitation R   Slump test negative  No significant LLD  POSTURE: rounded shoulders, forward head, decreased lumbar lordosis, increased thoracic kyphosis, and flexed trunk   PALPATION: No areas TTP R knee, posterior R LE   LOWER EXTREMITY ROM:  Active ROM  Right eval Left eval  Hip flexion    Hip extension    Hip abduction    Hip adduction    Hip internal rotation    Hip external rotation    Knee flexion 105* 120*  Knee extension 17* 10*  Ankle dorsiflexion    Ankle plantarflexion    Ankle inversion    Ankle eversion     (Blank rows = not tested)  Lumbar flexion WNL, RFIS causes some "I don't like you pain" but nothing beyond normal standing pain  Lumbar extension moderate limitation  Lumbar lateral flexion moderate limitation    LOWER EXTREMITY MMT:  MMT Right eval Left eval  Hip flexion 5 5  Hip extension Unable to tolerate prone  Unable to tolerate prone   Hip abduction 4+ 4+  Hip adduction    Hip internal rotation    Hip external rotation    Knee flexion 5 5  Knee extension 5 5  Ankle dorsiflexion 5 5  Ankle plantarflexion    Ankle inversion    Ankle eversion     (Blank rows = not tested)    GAIT: Distance walked: in clinic distances  Assistive device utilized: None Level of assistance: Complete Independence Comments: knee flexion in stance and swing phase B, flexed at hips, severe valgus L knee, limited dorsiflexion R ankle, short step lengths and stance times   TODAY'S TREATMENT:                                                                                                                               DATE:   Pt seen for aquatic therapy today.  Treatment took place in water 3.5-4.75 ft in depth at the [facility] pool. Temp of water was 91.  Pt entered/exited the pool via stairs using step to pattern with bilat hand rail.  *intro to setting *walking forward, back and side stepping in 4.0 and 3.6 ft multiple widths *Cycling on noodle (not tolerated); ue support corner of 4.8 ft hip add/abd (pain eventually); skiing tolerated well *decompression using yellow noodle *walking forward and back for recovery *seated on lift: LAQ (increased right knee pain); flutter kicking *standing ue support on wall 3.6 ft: toe raises x 8; heel raises x 5 *standing balance: tandem stance LLE forward; SLS R/L unsteadiness ue support on barbell *walking   Pt requires the buoyancy and hydrostatic pressure of water for support, and to offload joints by unweighting joint load by at least 50 % in navel deep water and by at least 75-80% in chest to neck deep water.  Viscosity of the water is needed for resistance of strengthening. Water current perturbations provides challenge to standing balance requiring increased core activation.      Education on findings today- noted impairments seem very similar to course of care last year which did not give significant pain relief per her report, would like to try a different approach this time around. Would really  like to try water PT and see if this is beneficial. General education on baker's cysts as well as presentation of lumbar involvement and sciatica (not present at eval today). Education that this is not necessarily related to a medical condition. Education that if water PT helps and she makes significant objective progress, we can certainly extend and plan on independent water exercise program, however will need to return to MD if she fails to make  objective progress.  PATIENT EDUCATION:  Education details: exam findings, POC, see above for details  Person educated: Patient and Spouse Education method: Medical illustrator Education comprehension: verbalized understanding and returned demonstration  HOME EXERCISE PROGRAM: Has existing exercise program that addresses deficits found on PT eval- will continue with this for now   ASSESSMENT:  CLINICAL IMPRESSION: Pt safe and indep in setting with therapist instructing from deck. Pt directed through gentle movement patterns in all depths and positions to determine toleration.  She appears to have left post knee/calf pain with L knee flex movement with slight resistance or load > ~40-50d.  Walking submerged in all directions relieved pain sensitivity and she does have a decrease in pain to 3/10 from 6/10 with activity.  Pt is a good candidate for aquatic intervention to trial improvement in strength as well as decrease in pain using the properties of water.   Initial clinical impression Patient is a 72 y.o. F who was seen today for physical therapy evaluation and treatment for R knee pain. Per patient report, her ortho MD has done a lot of "ruling things out" and is not sure of the source of her pain, referred her to PT for ongoing care. Very talkative and needed frequent redirection to eval related tasks and questions from PT.  Exam reveals limited B knee ROM (R>L), impaired muscle flexibility, gait impairment, and ongoing knee pain. Per chart review, objective impairments are very similar those found  and addressed last summer during her course of care here. Screened lumbar spine, seems clear at this time/not involved as a source of pain. She is already working on lumbar ROM and hamstring flexibility at home. I think the best option right now is to try water physical therapy here with frequent objective check-ins to make sure she is progressing.  OBJECTIVE IMPAIRMENTS: Abnormal gait,  decreased activity tolerance, decreased mobility, difficulty walking, decreased ROM, increased muscle spasms, impaired flexibility, improper body mechanics, postural dysfunction, obesity, and pain.   ACTIVITY LIMITATIONS: standing, stairs, transfers, and locomotion level  PARTICIPATION LIMITATIONS: cleaning, laundry, community activity, yard work, and church  PERSONAL FACTORS: Age, Behavior pattern, Education, Fitness, Past/current experiences, Profession, and Time since onset of injury/illness/exacerbation are also affecting patient's functional outcome.   REHAB POTENTIAL: Fair- limited progress/pain relief from PT interventions last year for same body part, chronicity of pain   CLINICAL DECISION MAKING: Stable/uncomplicated  EVALUATION COMPLEXITY: Low   GOALS: Goals reviewed with patient? No  SHORT TERM GOALS: Target date: 09/21/2022   Will be compliant with appropriate progressive HEP  Baseline: Goal status: INITIAL  2.  Stance time and step lengths to be WNL and patient will be able to maintain upright posture with all gait based tasks  Baseline:  Goal status: INITIAL  3.  Muscle flexibility impairments to have improved by 50% in all groups  Baseline:  Goal status: INITIAL  4.  Will be able to stand and walk for at least 40 minutes without increase in pain  Baseline:  Goal status: INITIAL    LONG  TERM GOALS: Target date: 10/12/2022    Will tolerate standing tasks for 75 minutes without increase in RLE pain  Baseline:  Goal status: INITIAL  2.  FOTO score to be within 5 points of predicted value to show improved subjective improvement  Baseline:  Goal status: INITIAL  3.  R knee flexion ROM to be equal to that of the left and extension ROM to be within 3 degrees of the L  Baseline:  Goal status: INITIAL  4.  Intensity of pain at rest to have improved by 50%  Baseline:  Goal status: INITIAL     PLAN:  PT FREQUENCY: 2x/week  PT DURATION: 6  weeks  PLANNED INTERVENTIONS: Therapeutic exercises, Therapeutic activity, Gait training, Self Care, Aquatic Therapy, Manual therapy, and Re-evaluation  PLAN FOR NEXT SESSION: water therapy focus with progression to land PT when appropriate- came to PT here last year with minimal improvement and similar impairments, may be at functional baseline- regular objective assessments to determine progress/lack of   Corrie Dandy Tomma Lightning) Yisel Megill MPT 09/06/22 12:54 PM  Referring diagnosis? Right knee pain M25.561 Treatment diagnosis? (if different than referring diagnosis)   Pain in right leg M79.604  Stiffness of right knee, not elsewhere classified M25.661  Muscle weakness (generalized) M62.81  Difficulty in walking, not elsewhere classified R26.2  What was this (referring dx) caused by? []  Surgery []  Fall [x]  Ongoing issue []  Arthritis []  Other: ____________  Laterality: [x]  Rt []  Lt []  Both  Check all possible CPT codes:  *CHOOSE 10 OR LESS*    [x]  97110 (Therapeutic Exercise)  []  92507 (SLP Treatment)  []  97112 (Neuro Re-ed)   []  92526 (Swallowing Treatment)   [x]  29528 (Gait Training)   []  [id number] (Cognitive Training, 1st 15 minutes) [x]  97140 (Manual Therapy)   []  97130 (Cognitive Training, each add'l 15 minutes)  [x]  97164 (Re-evaluation)                              []  Other, List CPT Code ____________  [x]  97530 (Therapeutic Activities)     [x]  97535 (Self Care)   []  All codes above (97110 - 97535)  []  97012 (Mechanical Traction)  []  97014 (E-stim Unattended)  []  97032 (E-stim manual)  []  97033 (Ionto)  []  97035 (Ultrasound) []  97750 (Physical Performance Training) [x]  [id number] (Aquatic Therapy) []  97016 (Vasopneumatic Device) []  [id number] (Paraffin) []  97034 (Contrast Bath) []  97597 (Wound Care 1st 20 sq cm) []  97598 (Wound Care each add'l 20 sq cm) []  97760 (Orthotic Fabrication, Fitting, Training Initial) []  [id number] (Prosthetic Management and Training Initial) []  [id number]  (Orthotic or Prosthetic Training/ Modification Subsequent)

## 2022-09-19 ENCOUNTER — Ambulatory Visit (HOSPITAL_BASED_OUTPATIENT_CLINIC_OR_DEPARTMENT_OTHER): Payer: Medicare PPO | Attending: Orthopedic Surgery | Admitting: Physical Therapy

## 2022-09-19 ENCOUNTER — Encounter (HOSPITAL_BASED_OUTPATIENT_CLINIC_OR_DEPARTMENT_OTHER): Payer: Self-pay | Admitting: Physical Therapy

## 2022-09-19 DIAGNOSIS — M79604 Pain in right leg: Secondary | ICD-10-CM | POA: Insufficient documentation

## 2022-09-19 DIAGNOSIS — M6281 Muscle weakness (generalized): Secondary | ICD-10-CM | POA: Diagnosis not present

## 2022-09-19 DIAGNOSIS — M25661 Stiffness of right knee, not elsewhere classified: Secondary | ICD-10-CM | POA: Insufficient documentation

## 2022-09-19 DIAGNOSIS — R262 Difficulty in walking, not elsewhere classified: Secondary | ICD-10-CM | POA: Insufficient documentation

## 2022-09-19 NOTE — Therapy (Signed)
OUTPATIENT PHYSICAL THERAPY LOWER EXTREMITY TREATMENT   Patient Name: Barbara [name] MRN: [id number] DOB:May 27, 1950, 72 y.o., female Today's Date: 09/19/2022  END OF SESSION:  PT End of Session - 09/19/22 1130     Visit Number 3    Number of Visits 13    Date for PT Re-Evaluation 10/12/22    Authorization Type [employer] M[employer]    Authorization Time Period 08/31/22 to 10/12/22    Progress Note Due on Visit 10    PT Start Time 1118    PT Stop Time 1158    PT Time Calculation (min) 40 min    Behavior During Therapy [facility] for tasks assessed/performed             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, nickel  03/31/2017   Anxiety    Arthritis    knees   Cancer (HCC)    basal cell- nose   GERD (gastroesophageal reflux disease)    Hyperlipidemia    Hypertension    Meningioma (HCC)    Neuromuscular disorder (HCC)    benign tremor- takes Propanolol   PONV (postoperative nausea and vomiting)    "BP DROPS ALSO"   Trigeminal neuralgia of right side of face    Past Surgical History:  Procedure Laterality Date   52 HOUR PH STUDY N/A 04/17/2016   Procedure: 24 HOUR PH STUDY;  Surgeon: Ruffin Frederick, MD;  Location: [facility];  Service: Gastroenterology;  Laterality: N/A;   ESOPHAGEAL MANOMETRY N/A 04/17/2016   Procedure: ESOPHAGEAL MANOMETRY (EM);  Surgeon: Ruffin Frederick, MD;  Location: [facility];  Service: Gastroenterology;  Laterality: N/A;   EXTERNAL FIXATION LEG Left 03/28/2017   [place]   EXTERNAL FIXATION LEG Left 03/28/2017   Procedure: EXTERNAL FIXATION LEG;  Surgeon: Sheral Apley, MD;  Location: [facility] OR;  Service: Orthopedics;  Laterality: Left;   EXTERNAL FIXATION REMOVAL Left 05/08/2017   Procedure: REMOVAL EXTERNAL FIXATION LEFT  LEG WITH HARDWARE REMOVAL;  Surgeon: Sheral Apley, MD;  Location: [facility];  Service: Orthopedics;  Laterality: Left;   fractured femur  05/2012   left - rod -screws placed   HARDWARE  REMOVAL Left 04/16/2013   Procedure: REMOVAL OF HARDWARE OF LEFT KNEE (DISTAL INTERLOC SCREW);  Surgeon: Loanne Drilling, MD;  Location: [facility];  Service: Orthopedics;  Laterality: Left;   KNEE SURGERY     x 3 left / x1 rt knee   LEFT HEART CATH AND CORONARY ANGIOGRAPHY N/A 09/18/2016   Procedure: Left Heart Cath and Coronary Angiography;  Surgeon: Tonny Bollman, MD;  Location: [facility] INVASIVE CV LAB;  Service: Cardiovascular;  Laterality: N/A;   NASAL SINUS SURGERY     2000   OTHER SURGICAL HISTORY  2014   titanium rod in left femur used for fracture repair   SHOULDER SURGERY     bilateral shoulders    WISDOM TOOTH EXTRACTION     Patient Active Problem List   Diagnosis Date Noted   Leg pain 08/08/2022   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, nickel  03/31/2017   Closed left tibial fracture 03/28/2017   Closed fracture of left tibial plateau 03/28/2017   Nonintractable headache 01/25/2017   Visual field defect    Chest pain 09/16/2016   Essential tremor 09/16/2016   Right facial numbness 09/16/2016   Bilateral scleritis 08/29/2016   Obesity 06/01/2016   Dyspnea on exertion 05/13/2016   Hoarseness 03/17/2016   Gastroesophageal reflux disease without esophagitis 03/17/2016  Globus sensation 03/17/2016   Gastritis 02/25/2016   Atypical chest pain 02/24/2016   Painful orthopaedic hardware (HCC) 04/15/2013   Vitamin D deficiency 12/09/2009   HYPERCHOLESTEROLEMIA 12/09/2009   Essential hypertension 12/09/2009   Allergic rhinitis 12/09/2009   NEPHROLITHIASIS 12/09/2009   CERVICAL POLYP 12/09/2009   Osteoarthritis 12/09/2009   Cough variant asthma vs UACS  12/09/2009    PCP: Mila Palmer MD   REFERRING PROVIDER: Sheral Apley, MD  REFERRING DIAG: Right knee pain  THERAPY DIAG:  Pain in right leg  Stiffness of right knee, not elsewhere classified  Muscle weakness (generalized)  Difficulty in walking, not elsewhere classified  Rationale for Evaluation and Treatment:  Rehabilitation  ONSET DATE: about a year   SUBJECTIVE:   SUBJECTIVE STATEMENT: Pt reports she had relief after 1st session, for 1.5 days.  She has been busy with family in town visiting until the end of the month. RLE feels best when held out straight.     From eval:  Dr. Eulah Pont and I have decided that we've ruled out all these different things and that it won't hurt to attack it with PT. I went to the [facility] and they couldn't figure out what was wrong, I wasn't impressed bc they told me I had a baker's cyst without doing a test for it. Dr. Eulah Pont did a test and I don't have a baker's cyst. The doctor told me I don't have enough fluid in the knee to draw off. Then we did a cortisone shot, I usually hate cortisone shots because it makes my pain worse, we tried it and it didn't help. We thought it was vascular at first and the ABI was fine. The pain is not in my knee, its down the back of my leg. I was here before working with Thurston Pounds for this same thing, at first it got better but then the pain got worse. I've tried statins before but I stopped them because they made my leg hurt and I developed large knots on the ends of each heel.   PERTINENT HISTORY: Nickel allergy, OA, skin CA, HLD, HTN, meningioma, benign tumor, hx trigeminal neuralgia, hx femur fx 2014 and multiple placements and removal of hardware in subsequent years, shoulder surgery, knee surgery  PAIN:  Are you having pain? Yes: NPRS scale: 2-5/10 Pain location: (pointed to back of leg in general/proximal hams R LE) Pain description: ache  Aggravating factors: walking and WB  Relieving factors: elevating LE   PRECAUTIONS: Other: nickel allergy   WEIGHT BEARING RESTRICTIONS: No  FALLS:  Has patient fallen in last 6 months? No  LIVING ENVIRONMENT: Lives with: lives with their spouse Lives in: House/apartment Stairs: 2 STE in garage, 4 STE B rails; no steps inside home  Has following equipment at home: Single point  cane  OCCUPATION: retired   PLOF: Independent, Independent with basic ADLs, Independent with gait, and Independent with transfers  PATIENT GOALS: improve pain, be able to walk   NEXT MD VISIT: Dr. Eulah Pont sometimes this summer (unsure)  OBJECTIVE:     PATIENT SURVEYS:  FOTO will do 2nd session (pt very talkative at eval, time limited)  COGNITION: Overall cognitive status: Within functional limits for tasks assessed       MUSCLE LENGTH:  HS: Moderate limitation L, severe limitation R Piriformis: WNL L, moderate limitation R   Slump test negative  No significant LLD  POSTURE: rounded shoulders, forward head, decreased lumbar lordosis, increased thoracic kyphosis, and flexed trunk  PALPATION: No areas TTP R knee, posterior R LE   LOWER EXTREMITY ROM:  Active ROM Right eval Left eval  Hip flexion    Hip extension    Hip abduction    Hip adduction    Hip internal rotation    Hip external rotation    Knee flexion 105* 120*  Knee extension 17* 10*  Ankle dorsiflexion    Ankle plantarflexion    Ankle inversion    Ankle eversion     (Blank rows = not tested)  Lumbar flexion WNL, RFIS causes some "I don't like you pain" but nothing beyond normal standing pain  Lumbar extension moderate limitation  Lumbar lateral flexion moderate limitation    LOWER EXTREMITY MMT:  MMT Right eval Left eval  Hip flexion 5 5  Hip extension Unable to tolerate prone  Unable to tolerate prone   Hip abduction 4+ 4+  Hip adduction    Hip internal rotation    Hip external rotation    Knee flexion 5 5  Knee extension 5 5  Ankle dorsiflexion 5 5  Ankle plantarflexion    Ankle inversion    Ankle eversion     (Blank rows = not tested)    GAIT: Distance walked: in clinic distances  Assistive device utilized: None Level of assistance: Complete Independence Comments: knee flexion in stance and swing phase B, flexed at hips, severe valgus L knee, limited dorsiflexion R ankle,  short step lengths and stance times   TODAY'S TREATMENT:                                                                                                                              Pt seen for aquatic therapy today.  Treatment took place in water 3.5-4.75 ft in depth at the [facility] pool. Temp of water was 91.  Pt entered/exited the pool via stairs using step to pattern with bilat hand rail.   *holding yellow hand floats : walking forward, back  (cues for TKE) and side stepping in 66ft  * added arm addct with yellow floats with side stepping (increased Rt lateral/post knee pain) * UE on yellow hand floats - backward/ forward marching; forward walking kicks ( hip flex to LAQ, "kick a small ball") -> progressed to no UE support * holding wall:  heel/toe raises x 10; leg swings into hip flex (with knee ext/DF)/ hip ext x 10 each (increased RLE pain with R SLS) * back against wall: 3 way LE stretch for RLE only * R/L gastroc stretch at wall x 20s  Pt requires the buoyancy and hydrostatic pressure of water for support, and to offload joints by unweighting joint load by at least 50 % in navel deep water and by at least 75-80% in chest to neck deep water.  Viscosity of the water is needed for resistance of strengthening. Water current perturbations provides challenge to standing balance requiring increased core activation.  PATIENT EDUCATION:  Education details: aquatic therapy exercise modifications  Person educated: Patient and Spouse Education method: Medical illustrator Education comprehension: verbalized understanding and returned demonstration  HOME EXERCISE PROGRAM: Has existing exercise program that addresses deficits found on PT eval- will continue with this for now   ASSESSMENT:  CLINICAL IMPRESSION:  Pt reports increase in pain when in Rt SLS, or with increase in Rt knee flexion (ie: marching).  She tolerate RLE stretch supported by noodle.  Pt is a good  candidate for aquatic intervention to trial improvement in strength as well as decrease in pain using the properties of water.  Will trial IASTM on Rt posterior LE prior to entry into water next visit.     OBJECTIVE IMPAIRMENTS: Abnormal gait, decreased activity tolerance, decreased mobility, difficulty walking, decreased ROM, increased muscle spasms, impaired flexibility, improper body mechanics, postural dysfunction, obesity, and pain.   ACTIVITY LIMITATIONS: standing, stairs, transfers, and locomotion level  PARTICIPATION LIMITATIONS: cleaning, laundry, community activity, yard work, and church  PERSONAL FACTORS: Age, Behavior pattern, Education, Fitness, Past/current experiences, Profession, and Time since onset of injury/illness/exacerbation are also affecting patient's functional outcome.   REHAB POTENTIAL: Fair- limited progress/pain relief from PT interventions last year for same body part, chronicity of pain   CLINICAL DECISION MAKING: Stable/uncomplicated  EVALUATION COMPLEXITY: Low   GOALS: Goals reviewed with patient? No  SHORT TERM GOALS: Target date: 09/21/2022   Will be compliant with appropriate progressive HEP  Baseline: Goal status: INITIAL  2.  Stance time and step lengths to be WNL and patient will be able to maintain upright posture with all gait based tasks  Baseline:  Goal status: INITIAL  3.  Muscle flexibility impairments to have improved by 50% in all groups  Baseline:  Goal status: INITIAL  4.  Will be able to stand and walk for at least 40 minutes without increase in pain  Baseline:  Goal status: INITIAL    LONG TERM GOALS: Target date: 10/12/2022    Will tolerate standing tasks for 75 minutes without increase in RLE pain  Baseline:  Goal status: INITIAL  2.  FOTO score to be within 5 points of predicted value to show improved subjective improvement  Baseline:  Goal status: INITIAL  3.  R knee flexion ROM to be equal to that of the left  and extension ROM to be within 3 degrees of the L  Baseline:  Goal status: INITIAL  4.  Intensity of pain at rest to have improved by 50%  Baseline:  Goal status: INITIAL     PLAN:  PT FREQUENCY: 2x/week  PT DURATION: 6 weeks  PLANNED INTERVENTIONS: Therapeutic exercises, Therapeutic activity, Gait training, Self Care, Aquatic Therapy, Manual therapy, and Re-evaluation  PLAN FOR NEXT SESSION: water therapy focus with progression to land PT when appropriate- came to PT here last year with minimal improvement and similar impairments, may be at functional baseline- regular objective assessments to determine progress/lack of   Mayer Camel, PTA 09/19/22 12:01 PM [facility] [address] [place], [place], [address] Phone: [phone]   Fax:  [phone]   Referring diagnosis? Right knee pain M25.561 Treatment diagnosis? (if different than referring diagnosis)   Pain in right leg M79.604  Stiffness of right knee, not elsewhere classified M25.661  Muscle weakness (generalized) M62.81  Difficulty in walking, not elsewhere classified R26.2  What was this (referring dx) caused by? []  Surgery []  Fall [x]  Ongoing issue []  Arthritis []  Other: ____________  Laterality: [x]  Rt []   Lt []  Both  Check all possible CPT codes:  *CHOOSE 10 OR LESS*    [x]  16109 (Therapeutic Exercise)  []  92507 (SLP Treatment)  []  97112 (Neuro Re-ed)   []  92526 (Swallowing Treatment)   [x]  97116 (Gait Training)   []  [phone] (Cognitive Training, 1st 15 minutes) [x]  97140 (Manual Therapy)   []  97130 (Cognitive Training, each add'l 15 minutes)  [x]  97164 (Re-evaluation)                              []  Other, List CPT Code ____________  [x]  97530 (Therapeutic Activities)     [x]  97535 (Self Care)   []  All codes above (97110 - 97535)  []  97012 (Mechanical Traction)  []  97014 (E-stim Unattended)  []  97032 (E-stim manual)  []  97033  (Ionto)  []  97035 (Ultrasound) []  97750 (Physical Performance Training) [x]  [id number] (Aquatic Therapy) []  97016 (Vasopneumatic Device) []  [id number] (Paraffin) []  97034 (Contrast Bath) []  97597 (Wound Care 1st 20 sq cm) []  97598 (Wound Care each add'l 20 sq cm) []  97760 (Orthotic Fabrication, Fitting, Training Initial) []  [id number] (Prosthetic Management and Training Initial) []  [id number] (Orthotic or Prosthetic Training/ Modification Subsequent)

## 2022-09-21 ENCOUNTER — Ambulatory Visit (HOSPITAL_BASED_OUTPATIENT_CLINIC_OR_DEPARTMENT_OTHER): Payer: Medicare PPO | Admitting: Physical Therapy

## 2022-09-21 ENCOUNTER — Encounter (HOSPITAL_BASED_OUTPATIENT_CLINIC_OR_DEPARTMENT_OTHER): Payer: Self-pay | Admitting: Physical Therapy

## 2022-09-21 DIAGNOSIS — M6281 Muscle weakness (generalized): Secondary | ICD-10-CM | POA: Diagnosis not present

## 2022-09-21 DIAGNOSIS — M25661 Stiffness of right knee, not elsewhere classified: Secondary | ICD-10-CM

## 2022-09-21 DIAGNOSIS — M79604 Pain in right leg: Secondary | ICD-10-CM

## 2022-09-21 DIAGNOSIS — R262 Difficulty in walking, not elsewhere classified: Secondary | ICD-10-CM | POA: Diagnosis not present

## 2022-09-21 NOTE — Therapy (Signed)
OUTPATIENT PHYSICAL THERAPY LOWER EXTREMITY TREATMENT   Patient Name: Barbara [name] MRN: [id number] DOB:03-27-1950, 72 y.o., female Today's Date: 09/21/2022  END OF SESSION:  PT End of Session - 09/21/22 1134     Visit Number 4    Number of Visits 13    Date for PT Re-Evaluation 10/12/22    Authorization Type [employer] M[employer]    Authorization Time Period 08/31/22 to 10/12/22    Progress Note Due on Visit 10    PT Start Time 1117    PT Stop Time 1155    PT Time Calculation (min) 38 min    Activity Tolerance Patient limited by pain    Behavior During Therapy [facility] for tasks assessed/performed             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, nickel  03/31/2017   Anxiety    Arthritis    knees   Cancer (HCC)    basal cell- nose   GERD (gastroesophageal reflux disease)    Hyperlipidemia    Hypertension    Meningioma (HCC)    Neuromuscular disorder (HCC)    benign tremor- takes Propanolol   PONV (postoperative nausea and vomiting)    "BP DROPS ALSO"   Trigeminal neuralgia of right side of face    Past Surgical History:  Procedure Laterality Date   74 HOUR PH STUDY N/A 04/17/2016   Procedure: 24 HOUR PH STUDY;  Surgeon: Ruffin Frederick, MD;  Location: [facility];  Service: Gastroenterology;  Laterality: N/A;   ESOPHAGEAL MANOMETRY N/A 04/17/2016   Procedure: ESOPHAGEAL MANOMETRY (EM);  Surgeon: Ruffin Frederick, MD;  Location: [facility];  Service: Gastroenterology;  Laterality: N/A;   EXTERNAL FIXATION LEG Left 03/28/2017   [place]   EXTERNAL FIXATION LEG Left 03/28/2017   Procedure: EXTERNAL FIXATION LEG;  Surgeon: Sheral Apley, MD;  Location: [facility] OR;  Service: Orthopedics;  Laterality: Left;   EXTERNAL FIXATION REMOVAL Left 05/08/2017   Procedure: REMOVAL EXTERNAL FIXATION LEFT  LEG WITH HARDWARE REMOVAL;  Surgeon: Sheral Apley, MD;  Location: [facility];  Service: Orthopedics;  Laterality: Left;   fractured femur   05/2012   left - rod -screws placed   HARDWARE REMOVAL Left 04/16/2013   Procedure: REMOVAL OF HARDWARE OF LEFT KNEE (DISTAL INTERLOC SCREW);  Surgeon: Loanne Drilling, MD;  Location: [facility];  Service: Orthopedics;  Laterality: Left;   KNEE SURGERY     x 3 left / x1 rt knee   LEFT HEART CATH AND CORONARY ANGIOGRAPHY N/A 09/18/2016   Procedure: Left Heart Cath and Coronary Angiography;  Surgeon: Tonny Bollman, MD;  Location: [facility];  Service: Cardiovascular;  Laterality: N/A;   NASAL SINUS SURGERY     2000   OTHER SURGICAL HISTORY  2014   titanium rod in left femur used for fracture repair   SHOULDER SURGERY     bilateral shoulders    WISDOM TOOTH EXTRACTION     Patient Active Problem List   Diagnosis Date Noted   Leg pain 08/08/2022   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, nickel  03/31/2017   Closed left tibial fracture 03/28/2017   Closed fracture of left tibial plateau 03/28/2017   Nonintractable headache 01/25/2017   Visual field defect    Chest pain 09/16/2016   Essential tremor 09/16/2016   Right facial numbness 09/16/2016   Bilateral scleritis 08/29/2016   Obesity 06/01/2016   Dyspnea on exertion 05/13/2016   Hoarseness 03/17/2016  Gastroesophageal reflux disease without esophagitis 03/17/2016   Globus sensation 03/17/2016   Gastritis 02/25/2016   Atypical chest pain 02/24/2016   Painful orthopaedic hardware (HCC) 04/15/2013   Vitamin D deficiency 12/09/2009   HYPERCHOLESTEROLEMIA 12/09/2009   Essential hypertension 12/09/2009   Allergic rhinitis 12/09/2009   NEPHROLITHIASIS 12/09/2009   CERVICAL POLYP 12/09/2009   Osteoarthritis 12/09/2009   Cough variant asthma vs UACS  12/09/2009    PCP: Mila Palmer MD   REFERRING PROVIDER: Sheral Apley, MD  REFERRING DIAG: Right knee pain  THERAPY DIAG:  Pain in right leg  Stiffness of right knee, not elsewhere classified  Muscle weakness (generalized)  Difficulty in walking, not elsewhere classified  Rationale  for Evaluation and Treatment: Rehabilitation  ONSET DATE: about a year   SUBJECTIVE:   SUBJECTIVE STATEMENT: Pt reports she felt "ok" (less pain) the rest of day after therapy, but the pain returned off and on that night.    From eval:  Dr. Eulah Pont and I have decided that we've ruled out all these different things and that it won't hurt to attack it with PT. I went to the [facility] and they couldn't figure out what was wrong, I wasn't impressed bc they told me I had a baker's cyst without doing a test for it. Dr. Eulah Pont did a test and I don't have a baker's cyst. The doctor told me I don't have enough fluid in the knee to draw off. Then we did a cortisone shot, I usually hate cortisone shots because it makes my pain worse, we tried it and it didn't help. We thought it was vascular at first and the ABI was fine. The pain is not in my knee, its down the back of my leg. I was here before working with Thurston Pounds for this same thing, at first it got better but then the pain got worse. I've tried statins before but I stopped them because they made my leg hurt and I developed large knots on the ends of each heel.   PERTINENT HISTORY: Nickel allergy, OA, skin CA, HLD, HTN, meningioma, benign tumor, hx trigeminal neuralgia, hx femur fx 2014 and multiple placements and removal of hardware in subsequent years, shoulder surgery, knee surgery  PAIN:  Are you having pain? Yes: NPRS scale: 5/10 Pain location: (pointed to back of leg in general/proximal hams R LE) Pain description: ache  Aggravating factors: walking and WB  Relieving factors: elevating LE   PRECAUTIONS: Other: nickel allergy   WEIGHT BEARING RESTRICTIONS: No  FALLS:  Has patient fallen in last 6 months? No  LIVING ENVIRONMENT: Lives with: lives with their spouse Lives in: House/apartment Stairs: 2 STE in garage, 4 STE B rails; no steps inside home  Has following equipment at home: Single point cane  OCCUPATION: retired   PLOF:  Independent, Independent with basic ADLs, Independent with gait, and Independent with transfers  PATIENT GOALS: improve pain, be able to walk   NEXT MD VISIT: Dr. Eulah Pont sometimes this summer (unsure)  OBJECTIVE:     PATIENT SURVEYS:  FOTO will do 2nd session (pt very talkative at eval, time limited)  COGNITION: Overall cognitive status: Within functional limits for tasks assessed       MUSCLE LENGTH:  HS: Moderate limitation L, severe limitation R Piriformis: WNL L, moderate limitation R   Slump test negative  No significant LLD  POSTURE: rounded shoulders, forward head, decreased lumbar lordosis, increased thoracic kyphosis, and flexed trunk   PALPATION: No areas TTP R  knee, posterior R LE   LOWER EXTREMITY ROM:  Active ROM Right eval Left eval  Hip flexion    Hip extension    Hip abduction    Hip adduction    Hip internal rotation    Hip external rotation    Knee flexion 105* 120*  Knee extension 17* 10*  Ankle dorsiflexion    Ankle plantarflexion    Ankle inversion    Ankle eversion     (Blank rows = not tested)  Lumbar flexion WNL, RFIS causes some "I don't like you pain" but nothing beyond normal standing pain  Lumbar extension moderate limitation  Lumbar lateral flexion moderate limitation    LOWER EXTREMITY MMT:  MMT Right eval Left eval  Hip flexion 5 5  Hip extension Unable to tolerate prone  Unable to tolerate prone   Hip abduction 4+ 4+  Hip adduction    Hip internal rotation    Hip external rotation    Knee flexion 5 5  Knee extension 5 5  Ankle dorsiflexion 5 5  Ankle plantarflexion    Ankle inversion    Ankle eversion     (Blank rows = not tested)    GAIT: Distance walked: in clinic distances  Assistive device utilized: None Level of assistance: Complete Independence Comments: knee flexion in stance and swing phase B, flexed at hips, severe valgus L knee, limited dorsiflexion R ankle, short step lengths and stance  times   TODAY'S TREATMENT:                                                                                                                              Prior to entry in water:   Manual therapy:STM to Rt hamstring with pt in Lt sidelying on massage table, (unable to tolerate prone position due to neck pain), to decrease fasical restrictions and improve Rt knee ROM  Pt seen for aquatic therapy today.  Treatment took place in water 3.5-4.75 ft in depth at the [facility] pool. Temp of water was 91.  Pt entered/exited the pool via stairs using step to pattern with bilat hand rail. *UE support on yellow hand floats : walking forward, back  (cues for TKE) and side stepping in 6ft  * at wall:  hip ext x 10 RLE, x 5 LLE (increased RLE in R stance); Rt gastroc stretch x 20s * seated on bench in water:  circuit of flutter kick, hip abdct/ addct, cycling - x 3 * short rest break seated * STS from bench in water, feet on blue step x 7 with cues for forward weight shift and hip hinge  Pt requires the buoyancy and hydrostatic pressure of water for support, and to offload joints by unweighting joint load by at least 50 % in navel deep water and by at least 75-80% in chest to neck deep water.  Viscosity of the water is needed for resistance of strengthening. Water current perturbations provides challenge  to standing balance requiring increased core activation.  PATIENT EDUCATION:  Education details: aquatic therapy exercise modifications  Person educated: Patient and Spouse Education method: Medical illustrator Education comprehension: verbalized understanding and returned demonstration  HOME EXERCISE PROGRAM: Has existing exercise program that addresses deficits found on PT eval- will continue with this for now   ASSESSMENT:  CLINICAL IMPRESSION:  Pt reports increase in pain when in Rt SLS, or with increase in Rt knee flexion (ie: cycling).  Fascial restrictions palpated in mid Rt  hamstring; reduced with STM at beginning of session.   Pt remains a good candidate for aquatic intervention to trial improvement in strength as well as decrease in pain using the properties of water.  (* avoided IASTM to tool due to pt's allergy to certain metals)     OBJECTIVE IMPAIRMENTS: Abnormal gait, decreased activity tolerance, decreased mobility, difficulty walking, decreased ROM, increased muscle spasms, impaired flexibility, improper body mechanics, postural dysfunction, obesity, and pain.   ACTIVITY LIMITATIONS: standing, stairs, transfers, and locomotion level  PARTICIPATION LIMITATIONS: cleaning, laundry, community activity, yard work, and church  PERSONAL FACTORS: Age, Behavior pattern, Education, Fitness, Past/current experiences, Profession, and Time since onset of injury/illness/exacerbation are also affecting patient's functional outcome.   REHAB POTENTIAL: Fair- limited progress/pain relief from PT interventions last year for same body part, chronicity of pain   CLINICAL DECISION MAKING: Stable/uncomplicated  EVALUATION COMPLEXITY: Low   GOALS: Goals reviewed with patient? No  SHORT TERM GOALS: Target date: 09/21/2022   Will be compliant with appropriate progressive HEP  Baseline: Goal status: INITIAL  2.  Stance time and step lengths to be WNL and patient will be able to maintain upright posture with all gait based tasks  Baseline:  Goal status: INITIAL  3.  Muscle flexibility impairments to have improved by 50% in all groups  Baseline:  Goal status: INITIAL  4.  Will be able to stand and walk for at least 40 minutes without increase in pain  Baseline:  Goal status: INITIAL    LONG TERM GOALS: Target date: 10/12/2022    Will tolerate standing tasks for 75 minutes without increase in RLE pain  Baseline:  Goal status: INITIAL  2.  FOTO score to be within 5 points of predicted value to show improved subjective improvement  Baseline:  Goal status:  INITIAL  3.  R knee flexion ROM to be equal to that of the left and extension ROM to be within 3 degrees of the L  Baseline:  Goal status: INITIAL  4.  Intensity of pain at rest to have improved by 50%  Baseline:  Goal status: INITIAL     PLAN:  PT FREQUENCY: 2x/week  PT DURATION: 6 weeks  PLANNED INTERVENTIONS: Therapeutic exercises, Therapeutic activity, Gait training, Self Care, Aquatic Therapy, Manual therapy, and Re-evaluation  PLAN FOR NEXT SESSION: water therapy focus with progression to land PT when appropriate- came to PT here last year with minimal improvement and similar impairments, may be at functional baseline- regular objective assessments to determine progress/lack of    Mayer Camel, PTA 09/21/22 11:58 AM [facility] [address] [place], [place], [address] Phone: [phone]   Fax:  [phone]    Referring diagnosis? Right knee pain M25.561 Treatment diagnosis? (if different than referring diagnosis)   Pain in right leg M79.604  Stiffness of right knee, not elsewhere classified M25.661  Muscle weakness (generalized) M62.81  Difficulty in walking, not elsewhere classified R26.2  What was this (referring  dx) caused by? []  Surgery []  Fall [x]  Ongoing issue []  Arthritis []  Other: ____________  Laterality: [x]  Rt []  Lt []  Both  Check all possible CPT codes:  *CHOOSE 10 OR LESS*    [x]  97110 (Therapeutic Exercise)  []  92507 (SLP Treatment)  []  16109 (Neuro Re-ed)   []  92526 (Swallowing Treatment)   [x]  60454 (Gait Training)   []  [id number] (Cognitive Training, 1st 15 minutes) [x]  97140 (Manual Therapy)   []  97130 (Cognitive Training, each add'l 15 minutes)  [x]  97164 (Re-evaluation)                              []  Other, List CPT Code ____________  [x]  97530 (Therapeutic Activities)     [x]  97535 (Self Care)   []  All codes above (97110 - 97535)  []  09811 (Mechanical Traction)  []   97014 (E-stim Unattended)  []  97032 (E-stim manual)  []  97033 (Ionto)  []  97035 (Ultrasound) []  97750 (Physical Performance Training) [x]  [id number] (Aquatic Therapy) []  97016 (Vasopneumatic Device) []  [id number] (Paraffin) []  97034 (Contrast Bath) []  97597 (Wound Care 1st 20 sq cm) []  97598 (Wound Care each add'l 20 sq cm) []  97760 (Orthotic Fabrication, Fitting, Training Initial) []  [id number] (Prosthetic Management and Training Initial) []  [id number] (Orthotic or Prosthetic Training/ Modification Subsequent)

## 2022-09-25 ENCOUNTER — Ambulatory Visit (HOSPITAL_BASED_OUTPATIENT_CLINIC_OR_DEPARTMENT_OTHER): Payer: Medicare PPO | Admitting: Physical Therapy

## 2022-09-25 ENCOUNTER — Encounter (HOSPITAL_BASED_OUTPATIENT_CLINIC_OR_DEPARTMENT_OTHER): Payer: Self-pay | Admitting: Physical Therapy

## 2022-09-25 DIAGNOSIS — M79604 Pain in right leg: Secondary | ICD-10-CM

## 2022-09-25 DIAGNOSIS — M25661 Stiffness of right knee, not elsewhere classified: Secondary | ICD-10-CM

## 2022-09-25 DIAGNOSIS — R262 Difficulty in walking, not elsewhere classified: Secondary | ICD-10-CM | POA: Diagnosis not present

## 2022-09-25 DIAGNOSIS — M6281 Muscle weakness (generalized): Secondary | ICD-10-CM

## 2022-09-25 NOTE — Therapy (Signed)
OUTPATIENT PHYSICAL THERAPY LOWER EXTREMITY TREATMENT   Patient Name: Barbara [name] MRN: [id number] DOB:26-Mar-1950, 72 y.o., female Today's Date: 09/25/2022  END OF SESSION:  PT End of Session - 09/25/22 0940     Visit Number 5    Number of Visits 13    Date for PT Re-Evaluation 10/12/22    Authorization Type [employer] M[employer]    Authorization Time Period 08/31/22 to 10/12/22    Progress Note Due on Visit 10    PT Start Time 0901    PT Stop Time 0945    PT Time Calculation (min) 44 min    Activity Tolerance Patient limited by pain;Patient tolerated treatment well    Behavior During Therapy [facility] for tasks assessed/performed              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, nickel  03/31/2017   Anxiety    Arthritis    knees   Cancer (HCC)    basal cell- nose   GERD (gastroesophageal reflux disease)    Hyperlipidemia    Hypertension    Meningioma (HCC)    Neuromuscular disorder (HCC)    benign tremor- takes Propanolol   PONV (postoperative nausea and vomiting)    "BP DROPS ALSO"   Trigeminal neuralgia of right side of face    Past Surgical History:  Procedure Laterality Date   108 HOUR PH STUDY N/A 04/17/2016   Procedure: 24 HOUR PH STUDY;  Surgeon: Ruffin Frederick, MD;  Location: [facility];  Service: Gastroenterology;  Laterality: N/A;   ESOPHAGEAL MANOMETRY N/A 04/17/2016   Procedure: ESOPHAGEAL MANOMETRY (EM);  Surgeon: Ruffin Frederick, MD;  Location: [facility];  Service: Gastroenterology;  Laterality: N/A;   EXTERNAL FIXATION LEG Left 03/28/2017   [place]   EXTERNAL FIXATION LEG Left 03/28/2017   Procedure: EXTERNAL FIXATION LEG;  Surgeon: Sheral Apley, MD;  Location: [facility] OR;  Service: Orthopedics;  Laterality: Left;   EXTERNAL FIXATION REMOVAL Left 05/08/2017   Procedure: REMOVAL EXTERNAL FIXATION LEFT  LEG WITH HARDWARE REMOVAL;  Surgeon: Sheral Apley, MD;  Location: [facility];  Service: Orthopedics;   Laterality: Left;   fractured femur  05/2012   left - rod -screws placed   HARDWARE REMOVAL Left 04/16/2013   Procedure: REMOVAL OF HARDWARE OF LEFT KNEE (DISTAL INTERLOC SCREW);  Surgeon: Loanne Drilling, MD;  Location: [facility];  Service: Orthopedics;  Laterality: Left;   KNEE SURGERY     x 3 left / x1 rt knee   LEFT HEART CATH AND CORONARY ANGIOGRAPHY N/A 09/18/2016   Procedure: Left Heart Cath and Coronary Angiography;  Surgeon: Tonny Bollman, MD;  Location: [facility] INVASIVE CV LAB;  Service: Cardiovascular;  Laterality: N/A;   NASAL SINUS SURGERY     2000   OTHER SURGICAL HISTORY  2014   titanium rod in left femur used for fracture repair   SHOULDER SURGERY     bilateral shoulders    WISDOM TOOTH EXTRACTION     Patient Active Problem List   Diagnosis Date Noted   Leg pain 08/08/2022   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, nickel  03/31/2017   Closed left tibial fracture 03/28/2017   Closed fracture of left tibial plateau 03/28/2017   Nonintractable headache 01/25/2017   Visual field defect    Chest pain 09/16/2016   Essential tremor 09/16/2016   Right facial numbness 09/16/2016   Bilateral scleritis 08/29/2016   Obesity 06/01/2016   Dyspnea on exertion 05/13/2016  Hoarseness 03/17/2016   Gastroesophageal reflux disease without esophagitis 03/17/2016   Globus sensation 03/17/2016   Gastritis 02/25/2016   Atypical chest pain 02/24/2016   Painful orthopaedic hardware (HCC) 04/15/2013   Vitamin D deficiency 12/09/2009   HYPERCHOLESTEROLEMIA 12/09/2009   Essential hypertension 12/09/2009   Allergic rhinitis 12/09/2009   NEPHROLITHIASIS 12/09/2009   CERVICAL POLYP 12/09/2009   Osteoarthritis 12/09/2009   Cough variant asthma vs UACS  12/09/2009    PCP: Mila Palmer MD   REFERRING PROVIDER: Sheral Apley, MD  REFERRING DIAG: Right knee pain  THERAPY DIAG:  Pain in right leg  Stiffness of right knee, not elsewhere classified  Muscle weakness (generalized)  Rationale for Evaluation  and Treatment: Rehabilitation  ONSET DATE: about a year   SUBJECTIVE:   SUBJECTIVE STATEMENT: "It is talking to me today. The day after the last PT I hurt from my heel up to my hip but it didn't feel as though I overdid   From eval:  Dr. Eulah Pont and I have decided that we've ruled out all these different things and that it won't hurt to attack it with PT. I went to the [facility] and they couldn't figure out what was wrong, I wasn't impressed bc they told me I had a baker's cyst without doing a test for it. Dr. Eulah Pont did a test and I don't have a baker's cyst. The doctor told me I don't have enough fluid in the knee to draw off. Then we did a cortisone shot, I usually hate cortisone shots because it makes my pain worse, we tried it and it didn't help. We thought it was vascular at first and the ABI was fine. The pain is not in my knee, its down the back of my leg. I was here before working with Thurston Pounds for this same thing, at first it got better but then the pain got worse. I've tried statins before but I stopped them because they made my leg hurt and I developed large knots on the ends of each heel.   PERTINENT HISTORY: Nickel allergy, OA, skin CA, HLD, HTN, meningioma, benign tumor, hx trigeminal neuralgia, hx femur fx 2014 and multiple placements and removal of hardware in subsequent years, shoulder surgery, knee surgery  PAIN:  Are you having pain? Yes: NPRS scale: 5-6/10 Pain location: (pointed to back of leg in general/proximal hams R LE) Pain description: ache  Aggravating factors: walking and WB  Relieving factors: elevating LE   PRECAUTIONS: Other: nickel allergy   WEIGHT BEARING RESTRICTIONS: No  FALLS:  Has patient fallen in last 6 months? No  LIVING ENVIRONMENT: Lives with: lives with their spouse Lives in: House/apartment Stairs: 2 STE in garage, 4 STE B rails; no steps inside home  Has following equipment at home: Single point cane  OCCUPATION: retired   PLOF:  Independent, Independent with basic ADLs, Independent with gait, and Independent with transfers  PATIENT GOALS: improve pain, be able to walk   NEXT MD VISIT: Dr. Eulah Pont sometimes this summer (unsure)  OBJECTIVE:     PATIENT SURVEYS:  FOTO will do 2nd session (pt very talkative at eval, time limited)  COGNITION: Overall cognitive status: Within functional limits for tasks assessed       MUSCLE LENGTH:  HS: Moderate limitation L, severe limitation R Piriformis: WNL L, moderate limitation R   Slump test negative  No significant LLD  POSTURE: rounded shoulders, forward head, decreased lumbar lordosis, increased thoracic kyphosis, and flexed trunk   PALPATION: No  areas TTP R knee, posterior R LE   LOWER EXTREMITY ROM:  Active ROM Right eval Left eval Right / Left 09/25/22  Hip flexion     Hip extension     Hip abduction     Hip adduction     Hip internal rotation     Hip external rotation     Knee flexion 105* 120* 115 / 120  Knee extension 17* 10* 14 / 7  Ankle dorsiflexion     Ankle plantarflexion     Ankle inversion     Ankle eversion      (Blank rows = not tested)  Lumbar flexion WNL, RFIS causes some "I don't like you pain" but nothing beyond normal standing pain  Lumbar extension moderate limitation  Lumbar lateral flexion moderate limitation    LOWER EXTREMITY MMT:  MMT Right eval Left eval  Hip flexion 5 5  Hip extension Unable to tolerate prone  Unable to tolerate prone   Hip abduction 4+ 4+  Hip adduction    Hip internal rotation    Hip external rotation    Knee flexion 5 5  Knee extension 5 5  Ankle dorsiflexion 5 5  Ankle plantarflexion    Ankle inversion    Ankle eversion     (Blank rows = not tested)    GAIT: Distance walked: in clinic distances  Assistive device utilized: None Level of assistance: Complete Independence Comments: knee flexion in stance and swing phase B, flexed at hips, severe valgus L knee, limited  dorsiflexion R ankle, short step lengths and stance times   TODAY'S TREATMENT:                                                                                                                                Pt seen for aquatic therapy today.  Treatment took place in water 3.5-4.75 ft in depth at the [facility] pool. Temp of water was 91.  Pt entered/exited the pool via stairs using step to pattern with bilat hand rail. *UE support on yellow hand floats : walking forward, back and side stepping in 18ft  * at wall:  hip ext; hip abd/add ; df; pf x10, Increased right calf discomfort; hip adb, hip extension * seated on bench in water:  circuit of flutter kick, hip abdct/ addct, cycling - x 3 * relaxed squats x 10 ue support hand rails * hip hinges x 10  Pt requires the buoyancy and hydrostatic pressure of water for support, and to offload joints by unweighting joint load by at least 50 % in navel deep water and by at least 75-80% in chest to neck deep water.  Viscosity of the water is needed for resistance of strengthening. Water current perturbations provides challenge to standing balance requiring increased core activation.  ROM measurements taken STG addressed  PATIENT EDUCATION:  Education details: aquatic therapy exercise modifications  Person educated: Patient and Spouse Education method: Explanation  and Demonstration Education comprehension: verbalized understanding and returned demonstration  HOME EXERCISE PROGRAM: Has existing exercise program that addresses deficits found on PT eval- will continue with this for now   ASSESSMENT:  CLINICAL IMPRESSION: Pt reports little decrease in overall rle pain.  Her pain does appear in all positions (hip/knee flex/ext) over time.  She reports walking toleration ~ 30-40 minutes before needing to rest. She has an improvement slightly in Rom bilat knees and reports compliance with HEP. Right knee flex with gait continues. Good response  immediately post last session but increase in 24 hours later that lasted "for a while".  Pt appears to have increase in pain with active knee flex. STG addressed   (* avoided IASTM to tool due to pt's allergy to certain metals)     OBJECTIVE IMPAIRMENTS: Abnormal gait, decreased activity tolerance, decreased mobility, difficulty walking, decreased ROM, increased muscle spasms, impaired flexibility, improper body mechanics, postural dysfunction, obesity, and pain.   ACTIVITY LIMITATIONS: standing, stairs, transfers, and locomotion level  PARTICIPATION LIMITATIONS: cleaning, laundry, community activity, yard work, and church  PERSONAL FACTORS: Age, Behavior pattern, Education, Fitness, Past/current experiences, Profession, and Time since onset of injury/illness/exacerbation are also affecting patient's functional outcome.   REHAB POTENTIAL: Fair- limited progress/pain relief from PT interventions last year for same body part, chronicity of pain   CLINICAL DECISION MAKING: Stable/uncomplicated  EVALUATION COMPLEXITY: Low   GOALS: Goals reviewed with patient? No  SHORT TERM GOALS: Target date: 09/21/2022   Will be compliant with appropriate progressive HEP  Baseline: Goal status: Met 09/25/22  2.  Stance time and step lengths to be WNL and patient will be able to maintain upright posture with all gait based tasks  Baseline:  Goal status: In progress 09/25/22  3.  Muscle flexibility impairments to have improved by 50% in all groups  Baseline:  Goal status: In progress 09/25/22  4.  Will be able to stand and walk for at least 40 minutes without increase in pain  Baseline:  Goal status: In progress 09/25/22    LONG TERM GOALS: Target date: 10/12/2022    Will tolerate standing tasks for 75 minutes without increase in RLE pain  Baseline:  Goal status: INITIAL  2.  FOTO score to be within 5 points of predicted value to show improved subjective improvement  Baseline:  Goal status:  INITIAL  3.  R knee flexion ROM to be equal to that of the left and extension ROM to be within 3 degrees of the L  Baseline:  Goal status: INITIAL  4.  Intensity of pain at rest to have improved by 50%  Baseline:  Goal status: INITIAL     PLAN:  PT FREQUENCY: 2x/week  PT DURATION: 6 weeks  PLANNED INTERVENTIONS: Therapeutic exercises, Therapeutic activity, Gait training, Self Care, Aquatic Therapy, Manual therapy, and Re-evaluation  PLAN FOR NEXT SESSION: water therapy focus with progression to land PT when appropriate- came to PT here last year with minimal improvement and similar impairments, may be at functional baseline- regular objective assessments to determine progress/lack of   Rushie Chestnut) Pattye Meda MPT 09/25/22 1:06 PM [facility] [address] [place], [place], [address] Phone: [phone]   Fax:  [phone]    Referring diagnosis? Right knee pain M25.561 Treatment diagnosis? (if different than referring diagnosis)   Pain in right leg M79.604  Stiffness of right knee, not elsewhere classified M25.661  Muscle weakness (generalized) M62.81  Difficulty in walking, not elsewhere classified R26.2  What was this (referring dx) caused by? []  Surgery []  Fall [x]  Ongoing issue []  Arthritis []  Other: ____________  Laterality: [x]  Rt []  Lt []  Both  Check all possible CPT codes:  *CHOOSE 10 OR LESS*    [x]  97110 (Therapeutic Exercise)  []  92507 (SLP Treatment)  []  21308 (Neuro Re-ed)   []  92526 (Swallowing Treatment)   [x]  97116 (Gait Training)   []  [id number] (Cognitive Training, 1st 15 minutes) [x]  97140 (Manual Therapy)   []  97130 (Cognitive Training, each add'l 15 minutes)  [x]  97164 (Re-evaluation)                              []  Other, List CPT Code ____________  [x]  97530 (Therapeutic Activities)     [x]  97535 (Self Care)   []  All codes above (97110 - 97535)  []  97012 (Mechanical Traction)  []  97014  (E-stim Unattended)  []  97032 (E-stim manual)  []  97033 (Ionto)  []  97035 (Ultrasound) []  97750 (Physical Performance Training) [x]  [id number] (Aquatic Therapy) []  97016 (Vasopneumatic Device) []  [id number] (Paraffin) []  97034 (Contrast Bath) []  97597 (Wound Care 1st 20 sq cm) []  97598 (Wound Care each add'l 20 sq cm) []  97760 (Orthotic Fabrication, Fitting, Training Initial) []  [id number] (Prosthetic Management and Training Initial) []  [id number] (Orthotic or Prosthetic Training/ Modification Subsequent)

## 2022-09-27 ENCOUNTER — Encounter (HOSPITAL_BASED_OUTPATIENT_CLINIC_OR_DEPARTMENT_OTHER): Payer: Self-pay | Admitting: Physical Therapy

## 2022-09-27 ENCOUNTER — Ambulatory Visit (HOSPITAL_BASED_OUTPATIENT_CLINIC_OR_DEPARTMENT_OTHER): Payer: Medicare PPO | Admitting: Physical Therapy

## 2022-09-27 DIAGNOSIS — M25661 Stiffness of right knee, not elsewhere classified: Secondary | ICD-10-CM | POA: Diagnosis not present

## 2022-09-27 DIAGNOSIS — R262 Difficulty in walking, not elsewhere classified: Secondary | ICD-10-CM | POA: Diagnosis not present

## 2022-09-27 DIAGNOSIS — M79604 Pain in right leg: Secondary | ICD-10-CM

## 2022-09-27 DIAGNOSIS — M6281 Muscle weakness (generalized): Secondary | ICD-10-CM

## 2022-09-27 NOTE — Therapy (Signed)
OUTPATIENT PHYSICAL THERAPY LOWER EXTREMITY TREATMENT   Patient Name: Barbara [name] MRN: [id number] DOB:12-20-1950, 72 y.o., female Today's Date: 09/27/2022  END OF SESSION:  PT End of Session - 09/27/22 0909     Visit Number 6    Number of Visits 13    Date for PT Re-Evaluation 10/12/22    Authorization Type [employer] MCR    Authorization Time Period 08/31/22 to 10/12/22    Progress Note Due on Visit 10    PT Start Time 0900    PT Stop Time 0945    PT Time Calculation (min) 45 min    Activity Tolerance Patient limited by pain;Patient tolerated treatment well    Behavior During Therapy [facility] for tasks assessed/performed              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, nickel  03/31/2017   Anxiety    Arthritis    knees   Cancer (HCC)    basal cell- nose   GERD (gastroesophageal reflux disease)    Hyperlipidemia    Hypertension    Meningioma (HCC)    Neuromuscular disorder (HCC)    benign tremor- takes Propanolol   PONV (postoperative nausea and vomiting)    "BP DROPS ALSO"   Trigeminal neuralgia of right side of face    Past Surgical History:  Procedure Laterality Date   95 HOUR PH STUDY N/A 04/17/2016   Procedure: 24 HOUR PH STUDY;  Surgeon: Ruffin Frederick, MD;  Location: [facility];  Service: Gastroenterology;  Laterality: N/A;   ESOPHAGEAL MANOMETRY N/A 04/17/2016   Procedure: ESOPHAGEAL MANOMETRY (EM);  Surgeon: Ruffin Frederick, MD;  Location: [facility];  Service: Gastroenterology;  Laterality: N/A;   EXTERNAL FIXATION LEG Left 03/28/2017   [place]   EXTERNAL FIXATION LEG Left 03/28/2017   Procedure: EXTERNAL FIXATION LEG;  Surgeon: Sheral Apley, MD;  Location: [facility] OR;  Service: Orthopedics;  Laterality: Left;   EXTERNAL FIXATION REMOVAL Left 05/08/2017   Procedure: REMOVAL EXTERNAL FIXATION LEFT  LEG WITH HARDWARE REMOVAL;  Surgeon: Sheral Apley, MD;  Location:  SURGERY CENTER;  Service: Orthopedics;   Laterality: Left;   fractured femur  05/2012   left - rod -screws placed   HARDWARE REMOVAL Left 04/16/2013   Procedure: REMOVAL OF HARDWARE OF LEFT KNEE (DISTAL INTERLOC SCREW);  Surgeon: Loanne Drilling, MD;  Location: [facility];  Service: Orthopedics;  Laterality: Left;   KNEE SURGERY     x 3 left / x1 rt knee   LEFT HEART CATH AND CORONARY ANGIOGRAPHY N/A 09/18/2016   Procedure: Left Heart Cath and Coronary Angiography;  Surgeon: Tonny Bollman, MD;  Location: [facility] INVASIVE CV LAB;  Service: Cardiovascular;  Laterality: N/A;   NASAL SINUS SURGERY     2000   OTHER SURGICAL HISTORY  2014   titanium rod in left femur used for fracture repair   SHOULDER SURGERY     bilateral shoulders    WISDOM TOOTH EXTRACTION     Patient Active Problem List   Diagnosis Date Noted   Leg pain 08/08/2022   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, nickel  03/31/2017   Closed left tibial fracture 03/28/2017   Closed fracture of left tibial plateau 03/28/2017   Nonintractable headache 01/25/2017   Visual field defect    Chest pain 09/16/2016   Essential tremor 09/16/2016   Right facial numbness 09/16/2016   Bilateral scleritis 08/29/2016   Obesity 06/01/2016   Dyspnea on exertion 05/13/2016  Hoarseness 03/17/2016   Gastroesophageal reflux disease without esophagitis 03/17/2016   Globus sensation 03/17/2016   Gastritis 02/25/2016   Atypical chest pain 02/24/2016   Painful orthopaedic hardware (HCC) 04/15/2013   Vitamin D deficiency 12/09/2009   HYPERCHOLESTEROLEMIA 12/09/2009   Essential hypertension 12/09/2009   Allergic rhinitis 12/09/2009   NEPHROLITHIASIS 12/09/2009   CERVICAL POLYP 12/09/2009   Osteoarthritis 12/09/2009   Cough variant asthma vs UACS  12/09/2009    PCP: Mila Palmer MD   REFERRING PROVIDER: Sheral Apley, MD  REFERRING DIAG: Right knee pain  THERAPY DIAG:  Pain in right leg  Stiffness of right knee, not elsewhere classified  Muscle weakness (generalized)  Rationale for Evaluation  and Treatment: Rehabilitation  ONSET DATE: about a year   SUBJECTIVE:   SUBJECTIVE STATEMENT: "Massaged the entire area in the [place] after last session.  Hurt when I did it but also felt good.  Since then my pain has been better 3/10"   From eval:  Dr. Eulah Pont and I have decided that we've ruled out all these different things and that it won't hurt to attack it with PT. I went to the [facility] and they couldn't figure out what was wrong, I wasn't impressed bc they told me I had a baker's cyst without doing a test for it. Dr. Eulah Pont did a test and I don't have a baker's cyst. The doctor told me I don't have enough fluid in the knee to draw off. Then we did a cortisone shot, I usually hate cortisone shots because it makes my pain worse, we tried it and it didn't help. We thought it was vascular at first and the ABI was fine. The pain is not in my knee, its down the back of my leg. I was here before working with Thurston Pounds for this same thing, at first it got better but then the pain got worse. I've tried statins before but I stopped them because they made my leg hurt and I developed large knots on the ends of each heel.   PERTINENT HISTORY: Nickel allergy, OA, skin CA, HLD, HTN, meningioma, benign tumor, hx trigeminal neuralgia, hx femur fx 2014 and multiple placements and removal of hardware in subsequent years, shoulder surgery, knee surgery  PAIN:  Are you having pain? Yes: NPRS scale: 3/10 Pain location: (pointed to back of leg in general/proximal hams R LE) Pain description: ache  Aggravating factors: walking and WB  Relieving factors: elevating LE   PRECAUTIONS: Other: nickel allergy   WEIGHT BEARING RESTRICTIONS: No  FALLS:  Has patient fallen in last 6 months? No  LIVING ENVIRONMENT: Lives with: lives with their spouse Lives in: House/apartment Stairs: 2 STE in garage, 4 STE B rails; no steps inside home  Has following equipment at home: Single point cane  OCCUPATION:  retired   PLOF: Independent, Independent with basic ADLs, Independent with gait, and Independent with transfers  PATIENT GOALS: improve pain, be able to walk   NEXT MD VISIT: Dr. Eulah Pont sometimes this summer (unsure)  OBJECTIVE:     PATIENT SURVEYS:  FOTO will do 2nd session (pt very talkative at eval, time limited)  COGNITION: Overall cognitive status: Within functional limits for tasks assessed       MUSCLE LENGTH:  HS: Moderate limitation L, severe limitation R Piriformis: WNL L, moderate limitation R   Slump test negative  No significant LLD  POSTURE: rounded shoulders, forward head, decreased lumbar lordosis, increased thoracic kyphosis, and flexed trunk   PALPATION: No  areas TTP R knee, posterior R LE   LOWER EXTREMITY ROM:  Active ROM Right eval Left eval Right / Left 09/25/22  Hip flexion     Hip extension     Hip abduction     Hip adduction     Hip internal rotation     Hip external rotation     Knee flexion 105* 120* 115 / 120  Knee extension 17* 10* 14 / 7  Ankle dorsiflexion     Ankle plantarflexion     Ankle inversion     Ankle eversion      (Blank rows = not tested)  Lumbar flexion WNL, RFIS causes some "I don't like you pain" but nothing beyond normal standing pain  Lumbar extension moderate limitation  Lumbar lateral flexion moderate limitation    LOWER EXTREMITY MMT:  MMT Right eval Left eval  Hip flexion 5 5  Hip extension Unable to tolerate prone  Unable to tolerate prone   Hip abduction 4+ 4+  Hip adduction    Hip internal rotation    Hip external rotation    Knee flexion 5 5  Knee extension 5 5  Ankle dorsiflexion 5 5  Ankle plantarflexion    Ankle inversion    Ankle eversion     (Blank rows = not tested)    GAIT: Distance walked: in clinic distances  Assistive device utilized: None Level of assistance: Complete Independence Comments: knee flexion in stance and swing phase B, flexed at hips, severe valgus L knee,  limited dorsiflexion R ankle, short step lengths and stance times   TODAY'S TREATMENT:                                                                                                                              09/27/22  Pt seen for aquatic therapy today.  Treatment took place in water 3.5-4.75 ft in depth at the [facility] pool. Temp of water was 91.  Pt entered/exited the pool via stairs using step to pattern with bilat hand rail. *UE support on yellow hand floats : walking forward, back and side stepping in 67ft  * at wall:  hip ext; hip abd/add ; df; pf x10, Increased right calf discomfort to heel, relaxed squats x10 * seated on bench in water:  circuit of flutter kick, hip abdct/ addct, cycling - x 3 * hip hinges 2 x 6 *gait training ue support noodle. Cues for heel strike and toe off, symmetrical step length   Pt requires the buoyancy and hydrostatic pressure of water for support, and to offload joints by unweighting joint load by at least 50 % in navel deep water and by at least 75-80% in chest to neck deep water.  Viscosity of the water is needed for resistance of strengthening. Water current perturbations provides challenge to standing balance requiring increased core activation.    PATIENT EDUCATION:  Education details: aquatic therapy exercise modifications  Person educated: Patient  and Spouse Education method: Explanation and Demonstration Education comprehension: verbalized understanding and returned demonstration  HOME EXERCISE PROGRAM: Has existing exercise program that addresses deficits found on PT eval- will continue with this for now   ASSESSMENT:  CLINICAL IMPRESSION: Pt reports 2-3 point reduction in pain since last session. Visibly pt demonstrates improvement in toleration to exercises and movement. She demonstrates toleration of right ankle DF unloaded vs loaded (with increased popliteal area pain). Completes session similar to last and ended in [place]  for massage hoping to recreate and further decrease pain sensitivity.  Good progress towards goals today  (* avoided IASTM to tool due to pt's allergy to certain metals)     OBJECTIVE IMPAIRMENTS: Abnormal gait, decreased activity tolerance, decreased mobility, difficulty walking, decreased ROM, increased muscle spasms, impaired flexibility, improper body mechanics, postural dysfunction, obesity, and pain.   ACTIVITY LIMITATIONS: standing, stairs, transfers, and locomotion level  PARTICIPATION LIMITATIONS: cleaning, laundry, community activity, yard work, and church  PERSONAL FACTORS: Age, Behavior pattern, Education, Fitness, Past/current experiences, Profession, and Time since onset of injury/illness/exacerbation are also affecting patient's functional outcome.   REHAB POTENTIAL: Fair- limited progress/pain relief from PT interventions last year for same body part, chronicity of pain   CLINICAL DECISION MAKING: Stable/uncomplicated  EVALUATION COMPLEXITY: Low   GOALS: Goals reviewed with patient? No  SHORT TERM GOALS: Target date: 09/21/2022   Will be compliant with appropriate progressive HEP  Baseline: Goal status: Met 09/25/22  2.  Stance time and step lengths to be WNL and patient will be able to maintain upright posture with all gait based tasks  Baseline:  Goal status: In progress 09/25/22  3.  Muscle flexibility impairments to have improved by 50% in all groups  Baseline:  Goal status: In progress 09/25/22  4.  Will be able to stand and walk for at least 40 minutes without increase in pain  Baseline:  Goal status: In progress 09/25/22    LONG TERM GOALS: Target date: 10/12/2022    Will tolerate standing tasks for 75 minutes without increase in RLE pain  Baseline:  Goal status: INITIAL  2.  FOTO score to be within 5 points of predicted value to show improved subjective improvement  Baseline:  Goal status: INITIAL  3.  R knee flexion ROM to be equal to that of  the left and extension ROM to be within 3 degrees of the L  Baseline:  Goal status: INITIAL  4.  Intensity of pain at rest to have improved by 50%  Baseline:  Goal status: INITIAL     PLAN:  PT FREQUENCY: 2x/week  PT DURATION: 6 weeks  PLANNED INTERVENTIONS: Therapeutic exercises, Therapeutic activity, Gait training, Self Care, Aquatic Therapy, Manual therapy, and Re-evaluation  PLAN FOR NEXT SESSION: water therapy focus with progression to land PT when appropriate- came to PT here last year with minimal improvement and similar impairments, may be at functional baseline- regular objective assessments to determine progress/lack of   Rushie Chestnut) Saraphina Lauderbaugh MPT 09/27/22 9:09 AM [facility] [address] [place], [place], [address] Phone: [phone]   Fax:  [phone]    Referring diagnosis? Right knee pain M25.561 Treatment diagnosis? (if different than referring diagnosis)   Pain in right leg M79.604  Stiffness of right knee, not elsewhere classified M25.661  Muscle weakness (generalized) M62.81  Difficulty in walking, not elsewhere classified R26.2  What was this (referring dx) caused by? []  Surgery []  Fall [x]  Ongoing issue []  Arthritis []  Other:  ____________  Laterality: [x]  Rt []  Lt []  Both  Check all possible CPT codes:  *CHOOSE 10 OR LESS*    [x]  97110 (Therapeutic Exercise)  []  92507 (SLP Treatment)  []  97112 (Neuro Re-ed)   []  92526 (Swallowing Treatment)   [x]  16109 (Gait Training)   []  [id number] (Cognitive Training, 1st 15 minutes) [x]  97140 (Manual Therapy)   []  97130 (Cognitive Training, each add'l 15 minutes)  [x]  97164 (Re-evaluation)                              []  Other, List CPT Code ____________  [x]  97530 (Therapeutic Activities)     [x]  97535 (Self Care)   []  All codes above (97110 - 97535)  []  97012 (Mechanical Traction)  []  97014 (E-stim Unattended)  []  97032 (E-stim manual)  []   97033 (Ionto)  []  97035 (Ultrasound) []  60454 (Physical Performance Training) [x]  [id number] (Aquatic Therapy) []  97016 (Vasopneumatic Device) []  [id number] (Paraffin) []  97034 (Contrast Bath) []  97597 (Wound Care 1st 20 sq cm) []  97598 (Wound Care each add'l 20 sq cm) []  97760 (Orthotic Fabrication, Fitting, Training Initial) []  [id number] (Prosthetic Management and Training Initial) []  [id number] (Orthotic or Prosthetic Training/ Modification Subsequent)

## 2022-09-27 NOTE — Progress Notes (Signed)
[facility] [address] [place] [address] Phone: [phone] Subjective:   Bruce Donath, am serving as a scribe for Dr. Antoine Primas.  I'm seeing this patient by the request  of:  Mila Palmer, MD  CC: Right leg pain  [id number]  Barbara [name] is a 72 y.o. female coming in with complaint of R leg pain that has been worsening over past 5 months. Has slight pain in her back. Has been doing rehab and seeing rheumatology.  Patient has also been in formal physical therapy patient states that for almost 2 years she has had pain in R leg from hip to ankle. Believes that taking a statin for 2 weeks is when he pain started to develop. States that she has a knot in achilles insertion and swelling in her entire leg. Unable to full extend knee.   Also mentions that she has a tremor and it seems to be worsening.   Has been doing aquatic PT. Saw vascular and had ABI performed. Has also seen neurology for EMG. Had [place] to look for Baker's cyst.   Reviewing patient's chart has had multiple surgeries on the left knee.  1 on the right knee.  Patient has been seen by vascular in May of this year.  At that point did not feel it is vascular in nature.  ABIs were unremarkable.  Patient also had a nerve conduction study that was unremarkable.  In November 2023.  Laboratory workup did show relatively low B12 at 314.  This was in October 2023  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, nickel  03/31/2017   Anxiety    Arthritis    knees   Cancer (HCC)    basal cell- nose   GERD (gastroesophageal reflux disease)    Hyperlipidemia    Hypertension    Meningioma (HCC)    Neuromuscular disorder (HCC)    benign tremor- takes Propanolol   PONV (postoperative nausea and vomiting)    "BP DROPS ALSO"   Trigeminal neuralgia of right side of face    Past Surgical History:  Procedure Laterality Date   35 HOUR PH STUDY N/A 04/17/2016   Procedure: 24  HOUR PH STUDY;  Surgeon: Ruffin Frederick, MD;  Location: [facility];  Service: Gastroenterology;  Laterality: N/A;   ESOPHAGEAL MANOMETRY N/A 04/17/2016   Procedure: ESOPHAGEAL MANOMETRY (EM);  Surgeon: Ruffin Frederick, MD;  Location: [facility];  Service: Gastroenterology;  Laterality: N/A;   EXTERNAL FIXATION LEG Left 03/28/2017   [place]   EXTERNAL FIXATION LEG Left 03/28/2017   Procedure: EXTERNAL FIXATION LEG;  Surgeon: Sheral Apley, MD;  [facility] OR;  Service: Orthopedics;  Laterality: Left;   EXTERNAL FIXATION REMOVAL Left 05/08/2017   Procedure: REMOVAL EXTERNAL FIXATION LEFT  LEG WITH HARDWARE REMOVAL;  Surgeon: Sheral Apley, MD;  Location: [facility];  Service: Orthopedics;  Laterality: Left;   fractured femur  05/2012   left - rod -screws placed   HARDWARE REMOVAL Left 04/16/2013   Procedure: REMOVAL OF HARDWARE OF LEFT KNEE (DISTAL INTERLOC SCREW);  Surgeon: Loanne Drilling, MD;  Location: [facility];  Service: Orthopedics;  Laterality: Left;   KNEE SURGERY     x 3 left / x1 rt knee   LEFT HEART CATH AND CORONARY ANGIOGRAPHY N/A 09/18/2016   Procedure: Left Heart Cath and Coronary Angiography;  Surgeon: Tonny Bollman, MD;  Location: [name] INVASIVE CV LAB;  Service:  Cardiovascular;  Laterality: N/A;   NASAL SINUS SURGERY     2000   OTHER SURGICAL HISTORY  2014   titanium rod in left femur used for fracture repair   SHOULDER SURGERY     bilateral shoulders    WISDOM TOOTH EXTRACTION     Social History   Socioeconomic History   Marital status: Married    Spouse name: Not on file   Number of children: 2   Years of education: college   Highest education level: Bachelor's degree (e.g., BA, AB, BS)  Occupational History   Occupation: retired Runner, broadcasting/film/video  Tobacco Use   Smoking status: Never   Smokeless tobacco: Never  Vaping Use   Vaping status: Never Used  Substance and Sexual Activity   Alcohol use: Yes    Comment: RARE   Drug use: No    Sexual activity: Not on file  Other Topics Concern   Not on file  Social History Narrative   Lives at home at home with husband. In a one story home   Right-handed.   No caffeine use.   Social Determinants of Health   Financial Resource Strain: Not on file  Food Insecurity: Not on file  Transportation Needs: Not on file  Physical Activity: Not on file  Stress: Not on file  Social Connections: Unknown (07/23/2021)   Received [facility]   Social Network    Social Network: Not on file   Allergies  Allergen Reactions   Ace Inhibitors     Pt unsure- nausea or hives   Amoxicillin Hives   Azathioprine Other (See Comments)    "cardiac issues"   Celebrex [Celecoxib] Other (See Comments)    Severe stomach cramps   Crestor [Rosuvastatin] Other (See Comments)    Bilat leg/heel pains/swelling   Enalapril Other (See Comments)    congestion   Fentanyl Nausea And Vomiting   Fish Oil Other (See Comments)    Broke out with blisters   Forteo [Parathyroid Hormone (Recomb)]    Gabapentin     Pain and swelling    Lanolin Acid [Lanolin]     rash   Levofloxacin Other (See Comments)    "sore ankles"   Maxzide [Triamterene-Hctz] Other (See Comments)    Knee, ankles and back pain   Methotrexate Derivatives Other (See Comments)    "cardiac issues"   Nickel     rash   Other Nausea And Vomiting    Narcotic Pain Killers- vomitting, nausea constantly   Penicillins Hives    Has patient had a PCN reaction causing immediate rash, facial/tongue/throat swelling, SOB or lightheadedness with hypotension: unknown Has patient had a PCN reaction causing severe rash involving mucus membranes or skin necrosis: no Has patient had a PCN reaction that required hospitalization - no, was at MD office Has patient had a PCN reaction occurring within the last 10 years: no If all of the above answers are "NO", then may proceed with Cephalosporin use.    Prednisone Other (See Comments)    High blood  pressure.   Propoxyphene Hcl Nausea Only   Sulfonamide Derivatives Nausea Only and Other (See Comments)    Stomach cramping   Synvisc [Hylan G-F 20]     Made knee swell up at injection site   Tramadol    Sulfa Antibiotics Nausea Only, Rash and Nausea And Vomiting   Family History  Problem Relation Age of Onset   Stomach cancer Mother    Heart disease Mother 26  First MI early 39s, CABG    Lung cancer Father        smoked   Allergies Father    Heart attack Father 73   Brain cancer Brother    Colon cancer Neg Hx    Esophageal cancer Neg Hx    Rectal cancer Neg Hx      Current Outpatient Medications (Cardiovascular):    propranolol (INDERAL) 10 MG tablet, Take 10 mg by mouth 3 (three) times daily.  Current Outpatient Medications (Respiratory):    guaiFENesin (MUCINEX) 600 MG 12 hr tablet, Take by mouth as needed.   levocetirizine (XYZAL) 5 MG tablet, Take 5 mg by mouth daily as needed for allergies.    loratadine (CLARITIN) 10 MG tablet, Take 10 mg by mouth daily as needed for allergies.    Current Outpatient Medications (Other):    b complex vitamins tablet, Take 1 tablet by mouth daily.   Cholecalciferol (VITAMIN D-3) 25 MCG (1000 UT) CAPS, Take 1 capsule by mouth daily.   Milk Thistle 250 MG CAPS, Take 1 capsule by mouth 2 (two) times daily.   Multiple Vitamins-Minerals (ICAPS PO), Take 1 capsule by mouth 2 (two) times daily.   omeprazole (PRILOSEC) 20 MG capsule, Take 20 mg by mouth daily.   psyllium (HYDROCIL/METAMUCIL) 95 % PACK, Take 1 packet by mouth 2 (two) times daily.   Reviewed prior external information including notes and imaging from  primary care provider As well as notes that were available from care everywhere and other healthcare systems.  Past medical history, social, surgical and family history all reviewed in electronic medical record.  No pertanent information unless stated regarding to the chief complaint.   Review of Systems:  No headache,  visual changes, nausea, vomiting, diarrhea, constipation, dizziness, abdominal pain, skin rash, fevers, chills, night sweats, weight loss, swollen lymph nodes, body aches, joint swelling, chest pain, shortness of breath, mood changes. POSITIVE muscle aches  Objective  Blood pressure 116/82, pulse 75, height 5\' 2"  (1.575 m), weight 193 lb (87.5 kg), SpO2 96%.   General: No apparent distress alert and oriented x3 mood and affect normal, dressed appropriately.  HEENT: Pupils equal, extraocular movements intact  Respiratory: Patient's speak in full sentences and does not appear short of breath  Cardiovascular: Trace edema noted to the legs bilaterally Right leg exam shows patient does have arthritic changes noted of the knee.  He does have some instability with valgus and varus force.  Lacks the last 10 degrees of extension noted. Patient also lacks last 15 degrees of flexion.  On the posterior aspect of the leg does seem to have some tenderness to palpation more on the popliteal area.  Limited muscular skeletal ultrasound was performed and interpreted by Antoine Primas, M  Limited ultrasound shows severe and nearly bone-on-bone osteoarthritic changes noted of the patellofemoral and the medial joint space.  Patient does have what appears to be a lipoma with no abnormal vascularity noted patient also has what appears to be more of a chronic tearing noted of the lateral hamstring.  Some scar tissue formation noted.    Impression and Recommendations:    The above documentation has been reviewed and is accurate and complete Judi Saa, DO

## 2022-09-28 ENCOUNTER — Encounter: Payer: Self-pay | Admitting: Family Medicine

## 2022-09-28 ENCOUNTER — Ambulatory Visit: Payer: Medicare PPO | Admitting: Family Medicine

## 2022-09-28 ENCOUNTER — Other Ambulatory Visit: Payer: Self-pay

## 2022-09-28 VITALS — BP 116/82 | HR 75 | Ht 62.0 in | Wt 193.0 lb

## 2022-09-28 DIAGNOSIS — M1711 Unilateral primary osteoarthritis, right knee: Secondary | ICD-10-CM

## 2022-09-28 DIAGNOSIS — M79604 Pain in right leg: Secondary | ICD-10-CM

## 2022-09-28 NOTE — Assessment & Plan Note (Addendum)
Do believe that the majority of patient's pain is secondary to the arthritic changes of the right knee.  We discussed with patient that there is also the possibility of the hamstring causing some discomfort.  I believe that the lipoma is superficial and not likely contributing to the pain.  We discussed with patient that I do think that the arthritic changes is causing the patient to have the limited range of motion at the moment.  Will follow-up with me again in 6 weeks.  Did discuss proper shoes.  Total time reviewing patient's chart as well as discussing with patient as well as significant other 47 minutes  Also discussed the possibility of physical therapy or aquatic therapy

## 2022-09-28 NOTE — Patient Instructions (Signed)
Consider Graston tool Keep doing heel lifts Gravity Defyer Thigh compression sleeve with exercise CQ10 200mg  daily See me again in 2-3 months

## 2022-10-02 ENCOUNTER — Encounter (HOSPITAL_BASED_OUTPATIENT_CLINIC_OR_DEPARTMENT_OTHER): Payer: Self-pay | Admitting: Physical Therapy

## 2022-10-02 ENCOUNTER — Ambulatory Visit (HOSPITAL_BASED_OUTPATIENT_CLINIC_OR_DEPARTMENT_OTHER): Payer: Medicare PPO | Admitting: Physical Therapy

## 2022-10-02 DIAGNOSIS — M25661 Stiffness of right knee, not elsewhere classified: Secondary | ICD-10-CM | POA: Diagnosis not present

## 2022-10-02 DIAGNOSIS — M79604 Pain in right leg: Secondary | ICD-10-CM | POA: Diagnosis not present

## 2022-10-02 DIAGNOSIS — M6281 Muscle weakness (generalized): Secondary | ICD-10-CM | POA: Diagnosis not present

## 2022-10-02 DIAGNOSIS — R262 Difficulty in walking, not elsewhere classified: Secondary | ICD-10-CM | POA: Diagnosis not present

## 2022-10-02 NOTE — Therapy (Signed)
OUTPATIENT PHYSICAL THERAPY LOWER EXTREMITY TREATMENT   Patient Name: Barbara [name] MRN: [id number] DOB:[birth year], 72 y.o., female Today's Date: 10/02/2022  END OF SESSION:  PT End of Session - 10/02/22 1301     Visit Number 7    Number of Visits 13    Date for PT Re-Evaluation 10/12/22    Authorization Type [employer] M[employer]    Authorization Time Period 08/31/22 to 10/12/22    Progress Note Due on Visit 10    PT Start Time 1030    PT Stop Time 1111    PT Time Calculation (min) 41 min    Activity Tolerance Patient limited by pain;Patient tolerated treatment well    Behavior During Therapy [facility] for tasks assessed/performed               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, nickel  03/31/2017   Anxiety    Arthritis    knees   Cancer (HCC)    basal cell- nose   GERD (gastroesophageal reflux disease)    Hyperlipidemia    Hypertension    Meningioma (HCC)    Neuromuscular disorder (HCC)    benign tremor- takes Propanolol   PONV (postoperative nausea and vomiting)    "BP DROPS ALSO"   Trigeminal neuralgia of right side of face    Past Surgical History:  Procedure Laterality Date   80 HOUR PH STUDY N/A 04/17/2016   Procedure: 24 HOUR PH STUDY;  Surgeon: Ruffin Frederick, MD;  Location: [facility];  Service: Gastroenterology;  Laterality: N/A;   ESOPHAGEAL MANOMETRY N/A 04/17/2016   Procedure: ESOPHAGEAL MANOMETRY (EM);  Surgeon: Ruffin Frederick, MD;  Location: [facility];  Service: Gastroenterology;  Laterality: N/A;   EXTERNAL FIXATION LEG Left 03/28/2017   [place]   EXTERNAL FIXATION LEG Left 03/28/2017   Procedure: EXTERNAL FIXATION LEG;  Surgeon: Sheral Apley, MD;  Location: [facility];  Service: Orthopedics;  Laterality: Left;   EXTERNAL FIXATION REMOVAL Left 05/08/2017   Procedure: REMOVAL EXTERNAL FIXATION LEFT  LEG WITH HARDWARE REMOVAL;  Surgeon: Sheral Apley, MD;  Location: [facility];  Service: Orthopedics;   Laterality: Left;   fractured femur  05/2012   left - rod -screws placed   HARDWARE REMOVAL Left 04/16/2013   Procedure: REMOVAL OF HARDWARE OF LEFT KNEE (DISTAL INTERLOC SCREW);  Surgeon: Loanne Drilling, MD;  Location: [facility];  Service: Orthopedics;  Laterality: Left;   KNEE SURGERY     x 3 left / x1 rt knee   LEFT HEART CATH AND CORONARY ANGIOGRAPHY N/A 09/18/2016   Procedure: Left Heart Cath and Coronary Angiography;  Surgeon: Tonny Bollman, MD;  Location: [facility] INVASIVE CV LAB;  Service: Cardiovascular;  Laterality: N/A;   NASAL SINUS SURGERY     2000   OTHER SURGICAL HISTORY  2014   titanium rod in left femur used for fracture repair   SHOULDER SURGERY     bilateral shoulders    WISDOM TOOTH EXTRACTION     Patient Active Problem List   Diagnosis Date Noted   Degenerative arthritis of right knee 09/28/2022   Leg pain 08/08/2022   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, nickel  03/31/2017   Closed left tibial fracture 03/28/2017   Closed fracture of left tibial plateau 03/28/2017   Nonintractable headache 01/25/2017   Visual field defect    Chest pain 09/16/2016   Essential tremor 09/16/2016   Right facial numbness 09/16/2016   Bilateral scleritis 08/29/2016  Obesity 06/01/2016   Dyspnea on exertion 05/13/2016   Hoarseness 03/17/2016   Gastroesophageal reflux disease without esophagitis 03/17/2016   Globus sensation 03/17/2016   Gastritis 02/25/2016   Atypical chest pain 02/24/2016   Painful orthopaedic hardware (HCC) 04/15/2013   Vitamin D deficiency 12/09/2009   HYPERCHOLESTEROLEMIA 12/09/2009   Essential hypertension 12/09/2009   Allergic rhinitis 12/09/2009   NEPHROLITHIASIS 12/09/2009   CERVICAL POLYP 12/09/2009   Osteoarthritis 12/09/2009   Cough variant asthma vs UACS  12/09/2009    PCP: Mila Palmer MD   REFERRING PROVIDER: Sheral Apley, MD  REFERRING DIAG: Right knee pain  THERAPY DIAG:  Pain in right leg  Stiffness of right knee, not elsewhere classified  Muscle  weakness (generalized)  Difficulty in walking, not elsewhere classified  Rationale for Evaluation and Treatment: Rehabilitation  ONSET DATE: about a year   SUBJECTIVE:   SUBJECTIVE STATEMENT: Pt reports that the pain in R leg woke her up in the night (up to 6/10).  She reports that there "are larger gaps" with less pain in Rt knee since starting therapy.  She reports that Dr. Katrinka Blazing recommended IASTM.    From eval:  Dr. Eulah Pont and I have decided that we've ruled out all these different things and that it won't hurt to attack it with PT. I went to the [facility] and they couldn't figure out what was wrong, I wasn't impressed bc they told me I had a baker's cyst without doing a test for it. Dr. Eulah Pont did a test and I don't have a baker's cyst. The doctor told me I don't have enough fluid in the knee to draw off. Then we did a cortisone shot, I usually hate cortisone shots because it makes my pain worse, we tried it and it didn't help. We thought it was vascular at first and the ABI was fine. The pain is not in my knee, its down the back of my leg. I was here before working with Thurston Pounds for this same thing, at first it got better but then the pain got worse. I've tried statins before but I stopped them because they made my leg hurt and I developed large knots on the ends of each heel.   PERTINENT HISTORY: Nickel allergy, OA, skin CA, HLD, HTN, meningioma, benign tumor, hx trigeminal neuralgia, hx femur fx 2014 and multiple placements and removal of hardware in subsequent years, shoulder surgery, knee surgery  PAIN:  Are you having pain? Yes: NPRS scale: 2-3/10 Pain location: (pointed to back of leg in general/proximal hams R LE) Pain description: ache  Aggravating factors: walking and WB  Relieving factors: elevating LE   PRECAUTIONS: Other: nickel allergy   WEIGHT BEARING RESTRICTIONS: No  FALLS:  Has patient fallen in last 6 months? No  LIVING ENVIRONMENT: Lives with: lives with  their spouse Lives in: House/apartment Stairs: 2 STE in garage, 4 STE B rails; no steps inside home  Has following equipment at home: Single point cane  OCCUPATION: retired   PLOF: Independent, Independent with basic ADLs, Independent with gait, and Independent with transfers  PATIENT GOALS: improve pain, be able to walk   NEXT MD VISIT: Dr. Eulah Pont sometimes this summer (unsure)  OBJECTIVE:     PATIENT SURVEYS:  FOTO will do 2nd session (pt very talkative at eval, time limited)  COGNITION: Overall cognitive status: Within functional limits for tasks assessed       MUSCLE LENGTH:  HS: Moderate limitation L, severe limitation R Piriformis: WNL L,  moderate limitation R   Slump test negative  No significant LLD  POSTURE: rounded shoulders, forward head, decreased lumbar lordosis, increased thoracic kyphosis, and flexed trunk   PALPATION: No areas TTP R knee, posterior R LE   LOWER EXTREMITY ROM:  Active ROM Right eval Left eval Right / Left 09/25/22  Hip flexion     Hip extension     Hip abduction     Hip adduction     Hip internal rotation     Hip external rotation     Knee flexion 105* 120* 115 / 120  Knee extension 17* 10* 14 / 7  Ankle dorsiflexion     Ankle plantarflexion     Ankle inversion     Ankle eversion      (Blank rows = not tested)  Lumbar flexion WNL, RFIS causes some "I don't like you pain" but nothing beyond normal standing pain  Lumbar extension moderate limitation  Lumbar lateral flexion moderate limitation    LOWER EXTREMITY MMT:  MMT Right eval Left eval  Hip flexion 5 5  Hip extension Unable to tolerate prone  Unable to tolerate prone   Hip abduction 4+ 4+  Hip adduction    Hip internal rotation    Hip external rotation    Knee flexion 5 5  Knee extension 5 5  Ankle dorsiflexion 5 5  Ankle plantarflexion    Ankle inversion    Ankle eversion     (Blank rows = not tested)    GAIT: Distance walked: in clinic distances   Assistive device utilized: None Level of assistance: Complete Independence Comments: knee flexion in stance and swing phase B, flexed at hips, severe valgus L knee, limited dorsiflexion R ankle, short step lengths and stance times   TODAY'S TREATMENT:                                                                                                                              10/02/22  Pt seen for aquatic therapy today.  Treatment took place in water 3.5-4.75 ft in depth at the [facility] pool. Temp of water was 91.  Pt entered/exited the pool via stairs using step to pattern with bilat hand rail. ( Pt utilized hot tub prior to the beginning of session- unbilled)  *no UE support: walking forward, back and side stepping in 62ft  * forward walking kicks with UE on yellow hand floats - 2 laps  *UE on yellow hand floats:  hip abdct/addct x 5-6 each LE-> Increased right calf discomfort to heel * seated on chair lift in water:  circuit of flutter kick, hip abdct/ addct, cycling, alternating LAQ with DF - x 3  (pain dissipated) * STS from 3rd step in water with forward arm reach x 6 * back against wall:  3 way RLE stretch x 15sec each position, x 2 reps    Pt requires the buoyancy and hydrostatic pressure of water for  support, and to offload joints by unweighting joint load by at least 50 % in navel deep water and by at least 75-80% in chest to neck deep water.  Viscosity of the water is needed for resistance of strengthening. Water current perturbations provides challenge to standing balance requiring increased core activation.    PATIENT EDUCATION:  Education details: aquatic therapy exercise modifications  Person educated: Patient and Spouse Education method: Medical illustrator Education comprehension: verbalized understanding and returned demonstration  HOME EXERCISE PROGRAM: Has existing exercise program that addresses deficits found on PT eval- will continue with this  for now   ASSESSMENT:  CLINICAL IMPRESSION: Pt continues to complain of increased Rt knee/lower leg pain with exercises in Rt SLS. She reported some relief when moving to seated LE exercises.   Pt making very gradual progress towards goals.  Will trial IASTM in sitting position next visit prior to entry in pool.     OBJECTIVE IMPAIRMENTS: Abnormal gait, decreased activity tolerance, decreased mobility, difficulty walking, decreased ROM, increased muscle spasms, impaired flexibility, improper body mechanics, postural dysfunction, obesity, and pain.   ACTIVITY LIMITATIONS: standing, stairs, transfers, and locomotion level  PARTICIPATION LIMITATIONS: cleaning, laundry, community activity, yard work, and church  PERSONAL FACTORS: Age, Behavior pattern, Education, Fitness, Past/current experiences, Profession, and Time since onset of injury/illness/exacerbation are also affecting patient's functional outcome.   REHAB POTENTIAL: Fair- limited progress/pain relief from PT interventions last year for same body part, chronicity of pain   CLINICAL DECISION MAKING: Stable/uncomplicated  EVALUATION COMPLEXITY: Low   GOALS: Goals reviewed with patient? No  SHORT TERM GOALS: Target date: 09/21/2022   Will be compliant with appropriate progressive HEP  Baseline: Goal status: Met 09/25/22  2.  Stance time and step lengths to be WNL and patient will be able to maintain upright posture with all gait based tasks  Baseline:  Goal status: In progress 09/25/22  3.  Muscle flexibility impairments to have improved by 50% in all groups  Baseline:  Goal status: In progress 09/25/22  4.  Will be able to stand and walk for at least 40 minutes without increase in pain  Baseline:  Goal status: In progress 09/25/22    LONG TERM GOALS: Target date: 10/12/2022    Will tolerate standing tasks for 75 minutes without increase in RLE pain  Baseline:  Goal status: INITIAL  2.  FOTO score to be within 5  points of predicted value to show improved subjective improvement  Baseline:  Goal status: INITIAL  3.  R knee flexion ROM to be equal to that of the left and extension ROM to be within 3 degrees of the L  Baseline:  Goal status: INITIAL  4.  Intensity of pain at rest to have improved by 50%  Baseline:  Goal status: INITIAL     PLAN:  PT FREQUENCY: 2x/week  PT DURATION: 6 weeks  PLANNED INTERVENTIONS: Therapeutic exercises, Therapeutic activity, Gait training, Self Care, Aquatic Therapy, Manual therapy, and Re-evaluation  PLAN FOR NEXT SESSION: water therapy focus with progression to land PT when appropriate- came to PT here last year with minimal improvement and similar impairments, may be at functional baseline- regular objective assessments to determine progress/lack of   Mayer Camel, PTA 10/02/22 1:02 PM [facility] [address] [place], [place], [address] Phone: [phone]   Fax:  [phone]     Referring diagnosis? Right knee pain M25.561 Treatment diagnosis? (if different than referring diagnosis)   Pain in right  leg M79.604  Stiffness of right knee, not elsewhere classified M25.661  Muscle weakness (generalized) M62.81  Difficulty in walking, not elsewhere classified R26.2  What was this (referring dx) caused by? []  Surgery []  Fall [x]  Ongoing issue []  Arthritis []  Other: ____________  Laterality: [x]  Rt []  Lt []  Both  Check all possible CPT codes:  *CHOOSE 10 OR LESS*    [x]  97110 (Therapeutic Exercise)  []  92507 (SLP Treatment)  []  97112 (Neuro Re-ed)   []  92526 (Swallowing Treatment)   [x]  97116 (Gait Training)   []  [id number] (Cognitive Training, 1st 15 minutes) [x]  97140 (Manual Therapy)   []  97130 (Cognitive Training, each add'l 15 minutes)  [x]  97164 (Re-evaluation)                              []  Other, List CPT Code ____________  [x]  97530 (Therapeutic Activities)     [x]   97535 (Self Care)   []  All codes above (97110 - 97535)  []  97012 (Mechanical Traction)  []  97014 (E-stim Unattended)  []  97032 (E-stim manual)  []  97033 (Ionto)  []  97035 (Ultrasound) []  97750 (Physical Performance Training) [x]  [id number] (Aquatic Therapy) []  97016 (Vasopneumatic Device) []  [id number] (Paraffin) []  97034 (Contrast Bath) []  97597 (Wound Care 1st 20 sq cm) []  97598 (Wound Care each add'l 20 sq cm) []  97760 (Orthotic Fabrication, Fitting, Training Initial) []  [id number] (Prosthetic Management and Training Initial) []  [id number] (Orthotic or Prosthetic Training/ Modification Subsequent)

## 2022-10-04 ENCOUNTER — Encounter (HOSPITAL_BASED_OUTPATIENT_CLINIC_OR_DEPARTMENT_OTHER): Payer: Self-pay | Admitting: Physical Therapy

## 2022-10-04 ENCOUNTER — Ambulatory Visit (HOSPITAL_BASED_OUTPATIENT_CLINIC_OR_DEPARTMENT_OTHER): Payer: Medicare PPO | Admitting: Physical Therapy

## 2022-10-04 DIAGNOSIS — M6281 Muscle weakness (generalized): Secondary | ICD-10-CM | POA: Diagnosis not present

## 2022-10-04 DIAGNOSIS — M25661 Stiffness of right knee, not elsewhere classified: Secondary | ICD-10-CM | POA: Diagnosis not present

## 2022-10-04 DIAGNOSIS — R262 Difficulty in walking, not elsewhere classified: Secondary | ICD-10-CM

## 2022-10-04 DIAGNOSIS — M79604 Pain in right leg: Secondary | ICD-10-CM

## 2022-10-04 NOTE — Therapy (Signed)
OUTPATIENT PHYSICAL THERAPY LOWER EXTREMITY TREATMENT   Patient Name: Barbara [name] MRN: [id number] DOB:[birth year], 72 y.o., female Today's Date: 10/04/2022  END OF SESSION:  PT End of Session - 10/04/22 0858     Visit Number 8    Number of Visits 13    Date for PT Re-Evaluation 10/12/22    Authorization Type [employer] MCR    Authorization Time Period 08/31/22 to 10/12/22    Progress Note Due on Visit 10    PT Start Time 0901    PT Stop Time 0940    PT Time Calculation (min) 39 min               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, nickel  03/31/2017   Anxiety    Arthritis    knees   Cancer (HCC)    basal cell- nose   GERD (gastroesophageal reflux disease)    Hyperlipidemia    Hypertension    Meningioma (HCC)    Neuromuscular disorder (HCC)    benign tremor- takes Propanolol   PONV (postoperative nausea and vomiting)    "BP DROPS ALSO"   Trigeminal neuralgia of right side of face    Past Surgical History:  Procedure Laterality Date   76 HOUR PH STUDY N/A 04/17/2016   Procedure: 24 HOUR PH STUDY;  Surgeon: Ruffin Frederick, MD;  Location: [facility];  Service: Gastroenterology;  Laterality: N/A;   ESOPHAGEAL MANOMETRY N/A 04/17/2016   Procedure: ESOPHAGEAL MANOMETRY (EM);  Surgeon: Ruffin Frederick, MD;  Location: [facility];  Service: Gastroenterology;  Laterality: N/A;   EXTERNAL FIXATION LEG Left 03/28/2017   [place]   EXTERNAL FIXATION LEG Left 03/28/2017   Procedure: EXTERNAL FIXATION LEG;  Surgeon: Sheral Apley, MD;  Location: [facility];  Service: Orthopedics;  Laterality: Left;   EXTERNAL FIXATION REMOVAL Left 05/08/2017   Procedure: REMOVAL EXTERNAL FIXATION LEFT  LEG WITH HARDWARE REMOVAL;  Surgeon: Sheral Apley, MD;  Location: [facility];  Service: Orthopedics;  Laterality: Left;   fractured femur  05/2012   left - rod -screws placed   HARDWARE REMOVAL Left 04/16/2013   Procedure: REMOVAL OF HARDWARE OF  LEFT KNEE (DISTAL INTERLOC SCREW);  Surgeon: Loanne Drilling, MD;  Location: [facility];  Service: Orthopedics;  Laterality: Left;   KNEE SURGERY     x 3 left / x1 rt knee   LEFT HEART CATH AND CORONARY ANGIOGRAPHY N/A 09/18/2016   Procedure: Left Heart Cath and Coronary Angiography;  Surgeon: Tonny Bollman, MD;  Location: [facility];  Service: Cardiovascular;  Laterality: N/A;   NASAL SINUS SURGERY     2000   OTHER SURGICAL HISTORY  2014   titanium rod in left femur used for fracture repair   SHOULDER SURGERY     bilateral shoulders    WISDOM TOOTH EXTRACTION     Patient Active Problem List   Diagnosis Date Noted   Degenerative arthritis of right knee 09/28/2022   Leg pain 08/08/2022   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, nickel  03/31/2017   Closed left tibial fracture 03/28/2017   Closed fracture of left tibial plateau 03/28/2017   Nonintractable headache 01/25/2017   Visual field defect    Chest pain 09/16/2016   Essential tremor 09/16/2016   Right facial numbness 09/16/2016   Bilateral scleritis 08/29/2016   Obesity 06/01/2016   Dyspnea on exertion 05/13/2016   Hoarseness 03/17/2016   Gastroesophageal reflux disease without esophagitis 03/17/2016  Globus sensation 03/17/2016   Gastritis 02/25/2016   Atypical chest pain 02/24/2016   Painful orthopaedic hardware (HCC) 04/15/2013   Vitamin D deficiency 12/09/2009   HYPERCHOLESTEROLEMIA 12/09/2009   Essential hypertension 12/09/2009   Allergic rhinitis 12/09/2009   NEPHROLITHIASIS 12/09/2009   CERVICAL POLYP 12/09/2009   Osteoarthritis 12/09/2009   Cough variant asthma vs UACS  12/09/2009    PCP: Mila Palmer MD   REFERRING PROVIDER: Sheral Apley, MD  REFERRING DIAG: Right knee pain  THERAPY DIAG:  Pain in right leg  Stiffness of right knee, not elsewhere classified  Muscle weakness (generalized)  Difficulty in walking, not elsewhere classified  Rationale for Evaluation and Treatment: Rehabilitation  ONSET  DATE: about a year   SUBJECTIVE:   SUBJECTIVE STATEMENT: Pt brought [place] tool from home ( gift from daughter) and she reports she'd like instruction of how to use it on her leg.  She has membership to [employer] and plans to use the pool at discharge.    From eval:  Dr. Eulah Pont and I have decided that we've ruled out all these different things and that it won't hurt to attack it with PT. I went to the [facility] and they couldn't figure out what was wrong, I wasn't impressed bc they told me I had a baker's cyst without doing a test for it. Dr. Eulah Pont did a test and I don't have a baker's cyst. The doctor told me I don't have enough fluid in the knee to draw off. Then we did a cortisone shot, I usually hate cortisone shots because it makes my pain worse, we tried it and it didn't help. We thought it was vascular at first and the ABI was fine. The pain is not in my knee, its down the back of my leg. I was here before working with Thurston Pounds for this same thing, at first it got better but then the pain got worse. I've tried statins before but I stopped them because they made my leg hurt and I developed large knots on the ends of each heel.   PERTINENT HISTORY: Nickel allergy, OA, skin CA, HLD, HTN, meningioma, benign tumor, hx trigeminal neuralgia, hx femur fx 2014 and multiple placements and removal of hardware in subsequent years, shoulder surgery, knee surgery  PAIN:  Are you having pain? Yes: NPRS scale: 2/10 Pain location: (pointed to back of leg in general/proximal hams R LE) Pain description: ache  Aggravating factors: walking and WB  Relieving factors: elevating LE   PRECAUTIONS: Other: nickel allergy   WEIGHT BEARING RESTRICTIONS: No  FALLS:  Has patient fallen in last 6 months? No  LIVING ENVIRONMENT: Lives with: lives with their spouse Lives in: House/apartment Stairs: 2 STE in garage, 4 STE B rails; no steps inside home  Has following equipment at home: Single point  cane  OCCUPATION: retired   PLOF: Independent, Independent with basic ADLs, Independent with gait, and Independent with transfers  PATIENT GOALS: improve pain, be able to walk   NEXT MD VISIT: Dr. Eulah Pont sometimes this summer (unsure)  OBJECTIVE:     PATIENT SURVEYS:  FOTO will do 2nd session (pt very talkative at eval, time limited)  COGNITION: Overall cognitive status: Within functional limits for tasks assessed       MUSCLE LENGTH:  HS: Moderate limitation L, severe limitation R Piriformis: WNL L, moderate limitation R   Slump test negative  No significant LLD  POSTURE: rounded shoulders, forward head, decreased lumbar lordosis, increased thoracic kyphosis, and flexed  trunk   PALPATION: No areas TTP R knee, posterior R LE   LOWER EXTREMITY ROM:  Active ROM Right eval Left eval Right / Left 09/25/22  Hip flexion     Hip extension     Hip abduction     Hip adduction     Hip internal rotation     Hip external rotation     Knee flexion 105* 120* 115 / 120  Knee extension 17* 10* 14 / 7  Ankle dorsiflexion     Ankle plantarflexion     Ankle inversion     Ankle eversion      (Blank rows = not tested)  Lumbar flexion WNL, RFIS causes some "I don't like you pain" but nothing beyond normal standing pain  Lumbar extension moderate limitation  Lumbar lateral flexion moderate limitation    LOWER EXTREMITY MMT:  MMT Right eval Left eval  Hip flexion 5 5  Hip extension Unable to tolerate prone  Unable to tolerate prone   Hip abduction 4+ 4+  Hip adduction    Hip internal rotation    Hip external rotation    Knee flexion 5 5  Knee extension 5 5  Ankle dorsiflexion 5 5  Ankle plantarflexion    Ankle inversion    Ankle eversion     (Blank rows = not tested)    GAIT: Distance walked: in clinic distances  Assistive device utilized: None Level of assistance: Complete Independence Comments: knee flexion in stance and swing phase B, flexed at hips,  severe valgus L knee, limited dorsiflexion R ankle, short step lengths and stance times   TODAY'S TREATMENT:                                                                                                                              10/04/22 Self care:  Pt instructed in self -IASTM on Rt hamstring/ lateral calf.  Pt returned demo with cues.   Pt seen for aquatic therapy today.  Treatment took place in water 3.5-4.75 ft in depth at the [facility] pool. Temp of water was 91.  Pt entered/exited the pool via stairs using step to pattern with bilat hand rail. ( Pt utilized hot tub prior to the beginning of session- unbilled)  *without  UE support: walking forward, back and side stepping in 69ft  * forward walking kicks - 1 lap  ( increases RLE pain) *UE on wall: Rt forward kick (hip flex to LAQ)to straight LE return to neutral x 8, repeated 4x on LLE (increased RLE pain in stance to 4/10) * return to walking forward/ backward  * seated on chair lift in water:  circuit of flutter kick, hip abdct/ addct, cycling, alternating LAQ with DF - x 2 (pain dissipated) * back against wall:  3 way RLE stretch x 15sec each position, x 2 reps   Pt requires the buoyancy and hydrostatic pressure of water for support, and to offload joints  by unweighting joint load by at least 50 % in navel deep water and by at least 75-80% in chest to neck deep water.  Viscosity of the water is needed for resistance of strengthening. Water current perturbations provides challenge to standing balance requiring increased core activation.  PATIENT EDUCATION:  Education details: aquatic therapy exercise modifications  Person educated: Patient and Spouse Education method: Medical illustrator Education comprehension: verbalized understanding and returned demonstration  HOME EXERCISE PROGRAM: Has existing exercise program that addresses deficits found on PT eval- will continue with this for now    ASSESSMENT:  CLINICAL IMPRESSION: Pt able to return demo of IASTM to RLE with cues today; verbalized understanding of parameters of treatment at home.  She continues to complain of increased Rt knee/lower leg pain with exercises in Rt SLS. She reported some relief when moving to seated LE exercises.   Pt making very gradual progress towards goals.  Over next 2 visits plan to create HEP for pt to transition to completing independently.   PT to assess goals and readiness to d/c to HEP vs recert.    OBJECTIVE IMPAIRMENTS: Abnormal gait, decreased activity tolerance, decreased mobility, difficulty walking, decreased ROM, increased muscle spasms, impaired flexibility, improper body mechanics, postural dysfunction, obesity, and pain.   ACTIVITY LIMITATIONS: standing, stairs, transfers, and locomotion level  PARTICIPATION LIMITATIONS: cleaning, laundry, community activity, yard work, and church  PERSONAL FACTORS: Age, Behavior pattern, Education, Fitness, Past/current experiences, Profession, and Time since onset of injury/illness/exacerbation are also affecting patient's functional outcome.   REHAB POTENTIAL: Fair- limited progress/pain relief from PT interventions last year for same body part, chronicity of pain   CLINICAL DECISION MAKING: Stable/uncomplicated  EVALUATION COMPLEXITY: Low   GOALS: Goals reviewed with patient? No  SHORT TERM GOALS: Target date: 09/21/2022   Will be compliant with appropriate progressive HEP  Baseline: Goal status: Met 09/25/22  2.  Stance time and step lengths to be WNL and patient will be able to maintain upright posture with all gait based tasks  Baseline:  Goal status: In progress 09/25/22  3.  Muscle flexibility impairments to have improved by 50% in all groups  Baseline:  Goal status: In progress 09/25/22  4.  Will be able to stand and walk for at least 40 minutes without increase in pain  Baseline:  Goal status: In progress 09/25/22    LONG  TERM GOALS: Target date: 10/12/2022    Will tolerate standing tasks for 75 minutes without increase in RLE pain  Baseline:  Goal status: INITIAL  2.  FOTO score to be within 5 points of predicted value to show improved subjective improvement  Baseline:  Goal status: INITIAL  3.  R knee flexion ROM to be equal to that of the left and extension ROM to be within 3 degrees of the L  Baseline:  Goal status: INITIAL  4.  Intensity of pain at rest to have improved by 50%  Baseline:  Goal status: INITIAL     PLAN:  PT FREQUENCY: 2x/week  PT DURATION: 6 weeks  PLANNED INTERVENTIONS: Therapeutic exercises, Therapeutic activity, Gait training, Self Care, Aquatic Therapy, Manual therapy, and Re-evaluation  PLAN FOR NEXT SESSION: water therapy focus with progression.  Mayer Camel, PTA 10/04/22 11:11 AM [facility] [address] [place], [place], [address] Phone: [phone]   Fax:  [phone]        Referring diagnosis? Right knee pain M25.561 Treatment diagnosis? (if different than referring diagnosis)   Pain  in right leg M79.604  Stiffness of right knee, not elsewhere classified M25.661  Muscle weakness (generalized) M62.81  Difficulty in walking, not elsewhere classified R26.2  What was this (referring dx) caused by? []  Surgery []  Fall [x]  Ongoing issue []  Arthritis []  Other: ____________  Laterality: [x]  Rt []  Lt []  Both  Check all possible CPT codes:  *CHOOSE 10 OR LESS*    [x]  97110 (Therapeutic Exercise)  []  92507 (SLP Treatment)  []  97112 (Neuro Re-ed)   []  92526 (Swallowing Treatment)   [x]  97116 (Gait Training)   []  96045 (Cognitive Training, 1st 15 minutes) [x]  97140 (Manual Therapy)   []  97130 (Cognitive Training, each add'l 15 minutes)  [x]  97164 (Re-evaluation)                              []  Other, List CPT Code ____________  [x]  97530 (Therapeutic Activities)     [x]  97535 (Self  Care)   []  All codes above (97110 - 97535)  []  97012 (Mechanical Traction)  []  97014 (E-stim Unattended)  []  97032 (E-stim manual)  []  97033 (Ionto)  []  97035 (Ultrasound) []  97750 (Physical Performance Training) [x]  [id number] (Aquatic Therapy) []  97016 (Vasopneumatic Device) []  [id number] (Paraffin) []  97034 (Contrast Bath) []  97597 (Wound Care 1st 20 sq cm) []  97598 (Wound Care each add'l 20 sq cm) []  97760 (Orthotic Fabrication, Fitting, Training Initial) []  [id number] (Prosthetic Management and Training Initial) []  [id number] (Orthotic or Prosthetic Training/ Modification Subsequent)

## 2022-10-09 ENCOUNTER — Encounter (HOSPITAL_BASED_OUTPATIENT_CLINIC_OR_DEPARTMENT_OTHER): Payer: Self-pay | Admitting: Physical Therapy

## 2022-10-09 ENCOUNTER — Ambulatory Visit (HOSPITAL_BASED_OUTPATIENT_CLINIC_OR_DEPARTMENT_OTHER): Payer: Medicare PPO | Admitting: Physical Therapy

## 2022-10-09 DIAGNOSIS — M79604 Pain in right leg: Secondary | ICD-10-CM

## 2022-10-09 DIAGNOSIS — M25661 Stiffness of right knee, not elsewhere classified: Secondary | ICD-10-CM | POA: Diagnosis not present

## 2022-10-09 DIAGNOSIS — M6281 Muscle weakness (generalized): Secondary | ICD-10-CM | POA: Diagnosis not present

## 2022-10-09 DIAGNOSIS — R262 Difficulty in walking, not elsewhere classified: Secondary | ICD-10-CM | POA: Diagnosis not present

## 2022-10-09 NOTE — Therapy (Signed)
OUTPATIENT PHYSICAL THERAPY LOWER EXTREMITY TREATMENT   Patient Name: Barbara [name] MRN: [id number] DOB:1950/05/06, 72 y.o., female Today's Date: 10/09/2022  END OF SESSION:  PT End of Session - 10/09/22 1208     Visit Number 9    Number of Visits 13    Date for PT Re-Evaluation 10/12/22    Authorization Type [employer] MCR    Authorization Time Period 08/31/22 to 10/12/22    Progress Note Due on Visit 10    PT Start Time 1035    PT Stop Time 1115    PT Time Calculation (min) 40 min    Activity Tolerance Patient tolerated treatment well    Behavior During Therapy [facility] for tasks assessed/performed                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, nickel  03/31/2017   Anxiety    Arthritis    knees   Cancer (HCC)    basal cell- nose   GERD (gastroesophageal reflux disease)    Hyperlipidemia    Hypertension    Meningioma (HCC)    Neuromuscular disorder (HCC)    benign tremor- takes Propanolol   PONV (postoperative nausea and vomiting)    "BP DROPS ALSO"   Trigeminal neuralgia of right side of face    Past Surgical History:  Procedure Laterality Date   59 HOUR PH STUDY N/A 04/17/2016   Procedure: 24 HOUR PH STUDY;  Surgeon: Ruffin Frederick, MD;  Location: [facility];  Service: Gastroenterology;  Laterality: N/A;   ESOPHAGEAL MANOMETRY N/A 04/17/2016   Procedure: ESOPHAGEAL MANOMETRY (EM);  Surgeon: Ruffin Frederick, MD;  Location: [facility];  Service: Gastroenterology;  Laterality: N/A;   EXTERNAL FIXATION LEG Left 03/28/2017   [place]   EXTERNAL FIXATION LEG Left 03/28/2017   Procedure: EXTERNAL FIXATION LEG;  Surgeon: Sheral Apley, MD;  Location: [facility] OR;  Service: Orthopedics;  Laterality: Left;   EXTERNAL FIXATION REMOVAL Left 05/08/2017   Procedure: REMOVAL EXTERNAL FIXATION LEFT  LEG WITH HARDWARE REMOVAL;  Surgeon: Sheral Apley, MD;  Location: [facility];  Service: Orthopedics;  Laterality: Left;    fractured femur  05/2012   left - rod -screws placed   HARDWARE REMOVAL Left 04/16/2013   Procedure: REMOVAL OF HARDWARE OF LEFT KNEE (DISTAL INTERLOC SCREW);  Surgeon: Loanne Drilling, MD;  Location: [facility];  Service: Orthopedics;  Laterality: Left;   KNEE SURGERY     x 3 left / x1 rt knee   LEFT HEART CATH AND CORONARY ANGIOGRAPHY N/A 09/18/2016   Procedure: Left Heart Cath and Coronary Angiography;  Surgeon: Tonny Bollman, MD;  Location: [facility] INVASIVE CV [facility];  Service: Cardiovascular;  Laterality: N/A;   NASAL SINUS SURGERY     2000   OTHER SURGICAL HISTORY  2014   titanium rod in left femur used for fracture repair   SHOULDER SURGERY     bilateral shoulders    WISDOM TOOTH EXTRACTION     Patient Active Problem List   Diagnosis Date Noted   Degenerative arthritis of right knee 09/28/2022   Leg pain 08/08/2022   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, nickel  03/31/2017   Closed left tibial fracture 03/28/2017   Closed fracture of left tibial plateau 03/28/2017   Nonintractable headache 01/25/2017   Visual field defect    Chest pain 09/16/2016   Essential tremor 09/16/2016   Right facial numbness 09/16/2016   Bilateral scleritis 08/29/2016   Obesity  06/01/2016   Dyspnea on exertion 05/13/2016   Hoarseness 03/17/2016   Gastroesophageal reflux disease without esophagitis 03/17/2016   Globus sensation 03/17/2016   Gastritis 02/25/2016   Atypical chest pain 02/24/2016   Painful orthopaedic hardware (HCC) 04/15/2013   Vitamin D deficiency 12/09/2009   HYPERCHOLESTEROLEMIA 12/09/2009   Essential hypertension 12/09/2009   Allergic rhinitis 12/09/2009   NEPHROLITHIASIS 12/09/2009   CERVICAL POLYP 12/09/2009   Osteoarthritis 12/09/2009   Cough variant asthma vs UACS  12/09/2009    PCP: Mila Palmer MD   REFERRING PROVIDER: Sheral Apley, MD  REFERRING DIAG: Right knee pain  THERAPY DIAG:  Pain in right leg  Stiffness of right knee, not elsewhere classified  Muscle weakness  (generalized)  Rationale for Evaluation and Treatment: Rehabilitation  ONSET DATE: about a year   SUBJECTIVE:   SUBJECTIVE STATEMENT: Pt reports if she leaves her knee bent for too long it increases pain.  Saw MD said she has a chronic hamstring tear   From eval:  Dr. Eulah Pont and I have decided that we've ruled out all these different things and that it won't hurt to attack it with PT. I went to the [facility] and they couldn't figure out what was wrong, I wasn't impressed bc they told me I had a baker's cyst without doing a test for it. Dr. Eulah Pont did a test and I don't have a baker's cyst. The doctor told me I don't have enough fluid in the knee to draw off. Then we did a cortisone shot, I usually hate cortisone shots because it makes my pain worse, we tried it and it didn't help. We thought it was vascular at first and the ABI was fine. The pain is not in my knee, its down the back of my leg. I was here before working with Thurston Pounds for this same thing, at first it got better but then the pain got worse. I've tried statins before but I stopped them because they made my leg hurt and I developed large knots on the ends of each heel.   PERTINENT HISTORY: Nickel allergy, OA, skin CA, HLD, HTN, meningioma, benign tumor, hx trigeminal neuralgia, hx femur fx 2014 and multiple placements and removal of hardware in subsequent years, shoulder surgery, knee surgery  PAIN:  Are you having pain? Yes: NPRS scale: 2/10 Pain location: (pointed to back of leg in general/proximal hams R LE) Pain description: ache  Aggravating factors: walking and WB  Relieving factors: elevating LE   PRECAUTIONS: Other: nickel allergy   WEIGHT BEARING RESTRICTIONS: No  FALLS:  Has patient fallen in last 6 months? No  LIVING ENVIRONMENT: Lives with: lives with their spouse Lives in: House/apartment Stairs: 2 STE in garage, 4 STE B rails; no steps inside home  Has following equipment at home: Single point  cane  OCCUPATION: retired   PLOF: Independent, Independent with basic ADLs, Independent with gait, and Independent with transfers  PATIENT GOALS: improve pain, be able to walk   NEXT MD VISIT: Dr. Eulah Pont sometimes this summer (unsure)  OBJECTIVE:     PATIENT SURVEYS:  FOTO will do 2nd session (pt very talkative at eval, time limited)  COGNITION: Overall cognitive status: Within functional limits for tasks assessed       MUSCLE LENGTH:  HS: Moderate limitation L, severe limitation R Piriformis: WNL L, moderate limitation R   Slump test negative  No significant LLD  POSTURE: rounded shoulders, forward head, decreased lumbar lordosis, increased thoracic kyphosis, and flexed trunk  PALPATION: No areas TTP R knee, posterior R LE   LOWER EXTREMITY ROM:  Active ROM Right eval Left eval Right / Left 09/25/22  Hip flexion     Hip extension     Hip abduction     Hip adduction     Hip internal rotation     Hip external rotation     Knee flexion 105* 120* 115 / 120  Knee extension 17* 10* 14 / 7  Ankle dorsiflexion     Ankle plantarflexion     Ankle inversion     Ankle eversion      (Blank rows = not tested)  Lumbar flexion WNL, RFIS causes some "I don't like you pain" but nothing beyond normal standing pain  Lumbar extension moderate limitation  Lumbar lateral flexion moderate limitation    LOWER EXTREMITY MMT:  MMT Right eval Left eval  Hip flexion 5 5  Hip extension Unable to tolerate prone  Unable to tolerate prone   Hip abduction 4+ 4+  Hip adduction    Hip internal rotation    Hip external rotation    Knee flexion 5 5  Knee extension 5 5  Ankle dorsiflexion 5 5  Ankle plantarflexion    Ankle inversion    Ankle eversion     (Blank rows = not tested)    GAIT: Distance walked: in clinic distances  Assistive device utilized: None Level of assistance: Complete Independence Comments: knee flexion in stance and swing phase B, flexed at hips,  severe valgus L knee, limited dorsiflexion R ankle, short step lengths and stance times   TODAY'S TREATMENT:                                                                                                                              10/09/22  Pt seen for aquatic therapy today.  Treatment took place in water 3.5-4.75 ft in depth at the [facility] pool. Temp of water was 91.  Pt entered/exited the pool via stairs using step to pattern with bilat hand rail. ( Pt utilized hot tub prior to the beginning of session- unbilled)     -Without  UE support: walking forward, back and side stepping in 39ft, side stepping x 4 widths;   Standing step forward amb with Leg Kick x 2 widths - Flutter Kicking/Windshield Wipers/cycling seated on lift - Standing step forward amb with Leg Kick x 2 widths - Standing Hip Flexion Extension at [employer] x10-15 - Standing Hip Abduction Adduction at [employer]  x10-15 - Standing Hip Hinge  x10 - Squat  x15 relaxed  - Sit to Stand  2 x 5 from 3rd step   Pt requires the buoyancy and hydrostatic pressure of water for support, and to offload joints by unweighting joint load by at least 50 % in navel deep water and by at least 75-80% in chest to neck deep water.  Viscosity of the water is needed for  resistance of strengthening. Water current perturbations provides challenge to standing balance requiring increased core activation.  PATIENT EDUCATION:  Education details: aquatic therapy exercise modifications  Person educated: Patient and Spouse Education method: Medical illustrator Education comprehension: verbalized understanding and returned demonstration  HOME EXERCISE PROGRAM: Has existing exercise program that addresses deficits found on PT eval- will continue with this for now   Aquatic This aquatic home exercise program from [employer] utilizes pictures from land based exercises, but has been adapted prior to lamination and issuance.   Access  Code: [email or web address] URL: [email or web address] Date: 10/09/2022 Prepared by: Geni Bers  Exercises - Standing Hip Flexion Extension at [employer]  - 1 x daily - 1-3 x weekly - 1-3 sets - 10 reps - Standing Hip Abduction Adduction at Pool Wall  - 1 x daily - 1-3 x weekly - 1-3 sets - 10 reps - Side to Side Hamstring Stretch with Noodle at Pool Wall  - 1 x daily - 1-3 x weekly - 3 sets - Standing Hip Hinge  - 1 x daily - 1-3 x weekly - 1-3 sets - 10 reps - Squat  - 1 x daily - 1-3 x weekly - 1-3 sets - 10 reps - Flutter Kicking/Windshield Wipers  - 1 x daily - 1-3 x weekly - 3 sets - 10-20 reps - Sit to Stand  - 1 x daily - 1-3 x weekly - 1-3 sets - 10 reps - Standing March with Leg Kick  - 1 x daily - 1-3 x weekly - 1-3 sets - 10 reps Issued 10/09/22 ASSESSMENT:  CLINICAL IMPRESSION: Pt dx with chronic hamstring tear per Dr Katrinka Blazing. She presents today with 2/10 pain.  She is directed through newly created HEP but is limited due to increase discomfort through session. 3-4 width of forward knee kicks appears to trigger.  Once triggered pt has difficulty with remainder of session.  There is no position or activity that reduces. She is issued laminated HEP and is encouraged to complete prior to return to ensure understanding and indep. May consider short extension of certification to expound on HEP. Will re-assess next session.     OBJECTIVE IMPAIRMENTS: Abnormal gait, decreased activity tolerance, decreased mobility, difficulty walking, decreased ROM, increased muscle spasms, impaired flexibility, improper body mechanics, postural dysfunction, obesity, and pain.   ACTIVITY LIMITATIONS: standing, stairs, transfers, and locomotion level  PARTICIPATION LIMITATIONS: cleaning, laundry, community activity, yard work, and church  PERSONAL FACTORS: Age, Behavior pattern, Education, Fitness, Past/current experiences, Profession, and Time since onset of injury/illness/exacerbation are also  affecting patient's functional outcome.   REHAB POTENTIAL: Fair- limited progress/pain relief from PT interventions last year for same body part, chronicity of pain   CLINICAL DECISION MAKING: Stable/uncomplicated  EVALUATION COMPLEXITY: Low   GOALS: Goals reviewed with patient? No  SHORT TERM GOALS: Target date: 09/21/2022   Will be compliant with appropriate progressive HEP  Baseline: Goal status: Met 09/25/22  2.  Stance time and step lengths to be WNL and patient will be able to maintain upright posture with all gait based tasks  Baseline:  Goal status: In progress 09/25/22  3.  Muscle flexibility impairments to have improved by 50% in all groups  Baseline:  Goal status: In progress 09/25/22  4.  Will be able to stand and walk for at least 40 minutes without increase in pain  Baseline:  Goal status: In progress 09/25/22    LONG TERM GOALS: Target date: 10/12/2022    Will tolerate standing tasks  for 75 minutes without increase in RLE pain  Baseline:  Goal status: INITIAL  2.  FOTO score to be within 5 points of predicted value to show improved subjective improvement  Baseline:  Goal status: INITIAL  3.  R knee flexion ROM to be equal to that of the left and extension ROM to be within 3 degrees of the L  Baseline:  Goal status: INITIAL  4.  Intensity of pain at rest to have improved by 50%  Baseline:  Goal status: INITIAL     PLAN:  PT FREQUENCY: 2x/week  PT DURATION: 6 weeks  PLANNED INTERVENTIONS: Therapeutic exercises, Therapeutic activity, Gait training, Self Care, Aquatic Therapy, Manual therapy, and Re-evaluation  PLAN FOR NEXT SESSION: water therapy focus with progression.  Corrie Dandy [place]) Temperance Kelemen MPT 10/09/22 12:09 PM [facility] [address] [place], [place], [address] Phone: [phone]   Fax:  [phone]        Referring diagnosis? Right knee pain M25.561 Treatment diagnosis? (if  different than referring diagnosis)   Pain in right leg M79.604  Stiffness of right knee, not elsewhere classified M25.661  Muscle weakness (generalized) M62.81  Difficulty in walking, not elsewhere classified R26.2  What was this (referring dx) caused by? []  Surgery []  Fall [x]  Ongoing issue []  Arthritis []  Other: ____________  Laterality: [x]  Rt []  Lt []  Both  Check all possible CPT codes:  *CHOOSE 10 OR LESS*    [x]  97110 (Therapeutic Exercise)  []  92507 (SLP Treatment)  []  97112 (Neuro Re-ed)   []  92526 (Swallowing Treatment)   [x]  97116 (Gait Training)   []  [id number] (Cognitive Training, 1st 15 minutes) [x]  97140 (Manual Therapy)   []  97130 (Cognitive Training, each add'l 15 minutes)  [x]  97164 (Re-evaluation)                              []  Other, List CPT Code ____________  [x]  97530 (Therapeutic Activities)     [x]  95284 (Self Care)   []  All codes above (97110 - 97535)  []  97012 (Mechanical Traction)  []  97014 (E-stim Unattended)  []  97032 (E-stim manual)  []  97033 (Ionto)  []  97035 (Ultrasound) []  97750 (Physical Performance Training) [x]  [id number] (Aquatic Therapy) []  97016 (Vasopneumatic Device) []  [id number] (Paraffin) []  97034 (Contrast Bath) []  97597 (Wound Care 1st 20 sq cm) []  97598 (Wound Care each add'l 20 sq cm) []  97760 (Orthotic Fabrication, Fitting, Training Initial) []  [id number] (Prosthetic Management and Training Initial) []  [id number] (Orthotic or Prosthetic Training/ Modification Subsequent)

## 2022-10-11 ENCOUNTER — Ambulatory Visit: Payer: Medicare PPO | Admitting: Family Medicine

## 2022-10-11 ENCOUNTER — Encounter (HOSPITAL_BASED_OUTPATIENT_CLINIC_OR_DEPARTMENT_OTHER): Payer: Self-pay | Admitting: Physical Therapy

## 2022-10-11 ENCOUNTER — Ambulatory Visit (HOSPITAL_BASED_OUTPATIENT_CLINIC_OR_DEPARTMENT_OTHER): Payer: Medicare PPO | Admitting: Physical Therapy

## 2022-10-11 DIAGNOSIS — M79604 Pain in right leg: Secondary | ICD-10-CM | POA: Diagnosis not present

## 2022-10-11 DIAGNOSIS — R262 Difficulty in walking, not elsewhere classified: Secondary | ICD-10-CM

## 2022-10-11 DIAGNOSIS — M6281 Muscle weakness (generalized): Secondary | ICD-10-CM | POA: Diagnosis not present

## 2022-10-11 DIAGNOSIS — M25661 Stiffness of right knee, not elsewhere classified: Secondary | ICD-10-CM | POA: Diagnosis not present

## 2022-10-11 NOTE — Therapy (Signed)
OUTPATIENT PHYSICAL THERAPY LOWER EXTREMITY DC PHYSICAL THERAPY DISCHARGE SUMMARY  Visits from Start of Care: 10  Current functional level related to goals / functional outcomes: Indep with AD's as needed/   Remaining deficits: OA knees and pain   Education / Equipment: Management of condition/ HEP   Patient agrees to discharge. Patient goals were partially met. Patient is being discharged due to being pleased with the current functional level.    Patient Name: Barbara [name] MRN: [id number] DOB:[birth year], 72 y.o., female Today's Date: 10/11/2022  END OF SESSION:  PT End of Session - 10/11/22 0905     Visit Number 10    Number of Visits 13    Date for PT Re-Evaluation 10/12/22    Authorization Type [employer] M[employer]    Authorization Time Period 08/31/22 to 10/12/22    Progress Note Due on Visit 10    PT Start Time 0901    PT Stop Time 0945    PT Time Calculation (min) 44 min    Activity Tolerance Patient tolerated treatment well    Behavior During Therapy [facility] for tasks assessed/performed                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, nickel  03/31/2017   Anxiety    Arthritis    knees   Cancer (HCC)    basal cell- nose   GERD (gastroesophageal reflux disease)    Hyperlipidemia    Hypertension    Meningioma (HCC)    Neuromuscular disorder (HCC)    benign tremor- takes Propanolol   PONV (postoperative nausea and vomiting)    "BP DROPS ALSO"   Trigeminal neuralgia of right side of face    Past Surgical History:  Procedure Laterality Date   24 HOUR PH STUDY N/A 04/17/2016   Procedure: 24 HOUR PH STUDY;  Surgeon: Ruffin Frederick, MD;  Location: [facility];  Service: Gastroenterology;  Laterality: N/A;   ESOPHAGEAL MANOMETRY N/A 04/17/2016   Procedure: ESOPHAGEAL MANOMETRY (EM);  Surgeon: Ruffin Frederick, MD;  Location: [facility];  Service: Gastroenterology;  Laterality: N/A;   EXTERNAL FIXATION LEG Left 03/28/2017    [place]   EXTERNAL FIXATION LEG Left 03/28/2017   Procedure: EXTERNAL FIXATION LEG;  Surgeon: Sheral Apley, MD;  Location: [facility] OR;  Service: Orthopedics;  Laterality: Left;   EXTERNAL FIXATION REMOVAL Left 05/08/2017   Procedure: REMOVAL EXTERNAL FIXATION LEFT  LEG WITH HARDWARE REMOVAL;  Surgeon: Sheral Apley, MD;  Location: [facility];  Service: Orthopedics;  Laterality: Left;   fractured femur  05/2012   left - rod -screws placed   HARDWARE REMOVAL Left 04/16/2013   Procedure: REMOVAL OF HARDWARE OF LEFT KNEE (DISTAL INTERLOC SCREW);  Surgeon: Loanne Drilling, MD;  Location: [facility];  Service: Orthopedics;  Laterality: Left;   KNEE SURGERY     x 3 left / x1 rt knee   LEFT HEART CATH AND CORONARY ANGIOGRAPHY N/A 09/18/2016   Procedure: Left Heart Cath and Coronary Angiography;  Surgeon: Tonny Bollman, MD;  Location: [facility] INVASIVE CV LAB;  Service: Cardiovascular;  Laterality: N/A;   NASAL SINUS SURGERY     2000   OTHER SURGICAL HISTORY  2014   titanium rod in left femur used for fracture repair   SHOULDER SURGERY     bilateral shoulders    WISDOM TOOTH EXTRACTION     Patient Active Problem List   Diagnosis Date Noted   Degenerative arthritis  of right knee 09/28/2022   Leg pain 08/08/2022   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, nickel  03/31/2017   Closed left tibial fracture 03/28/2017   Closed fracture of left tibial plateau 03/28/2017   Nonintractable headache 01/25/2017   Visual field defect    Chest pain 09/16/2016   Essential tremor 09/16/2016   Right facial numbness 09/16/2016   Bilateral scleritis 08/29/2016   Obesity 06/01/2016   Dyspnea on exertion 05/13/2016   Hoarseness 03/17/2016   Gastroesophageal reflux disease without esophagitis 03/17/2016   Globus sensation 03/17/2016   Gastritis 02/25/2016   Atypical chest pain 02/24/2016   Painful orthopaedic hardware (HCC) 04/15/2013   Vitamin D deficiency 12/09/2009   HYPERCHOLESTEROLEMIA 12/09/2009   Essential  hypertension 12/09/2009   Allergic rhinitis 12/09/2009   NEPHROLITHIASIS 12/09/2009   CERVICAL POLYP 12/09/2009   Osteoarthritis 12/09/2009   Cough variant asthma vs UACS  12/09/2009    PCP: Mila Palmer MD   REFERRING PROVIDER: Sheral Apley, MD  REFERRING DIAG: Right knee pain  THERAPY DIAG:  Pain in right leg  Stiffness of right knee, not elsewhere classified  Muscle weakness (generalized)  Difficulty in walking, not elsewhere classified  Rationale for Evaluation and Treatment: Rehabilitation  ONSET DATE: about a year   SUBJECTIVE:   SUBJECTIVE STATEMENT: Pt reports quad sleeve seems to be helping a little with pain   From eval:  Dr. Eulah Pont and I have decided that we've ruled out all these different things and that it won't hurt to attack it with PT. I went to the [facility] and they couldn't figure out what was wrong, I wasn't impressed bc they told me I had a baker's cyst without doing a test for it. Dr. Eulah Pont did a test and I don't have a baker's cyst. The doctor told me I don't have enough fluid in the knee to draw off. Then we did a cortisone shot, I usually hate cortisone shots because it makes my pain worse, we tried it and it didn't help. We thought it was vascular at first and the ABI was fine. The pain is not in my knee, its down the back of my leg. I was here before working with Thurston Pounds for this same thing, at first it got better but then the pain got worse. I've tried statins before but I stopped them because they made my leg hurt and I developed large knots on the ends of each heel.   PERTINENT HISTORY: Nickel allergy, OA, skin CA, HLD, HTN, meningioma, benign tumor, hx trigeminal neuralgia, hx femur fx 2014 and multiple placements and removal of hardware in subsequent years, shoulder surgery, knee surgery  PAIN:  Are you having pain? Yes: NPRS scale: 2/10 Pain location: (pointed to back of leg in general/proximal hams R LE) Pain description: ache   Aggravating factors: walking and WB  Relieving factors: elevating LE   PRECAUTIONS: Other: nickel allergy   WEIGHT BEARING RESTRICTIONS: No  FALLS:  Has patient fallen in last 6 months? No  LIVING ENVIRONMENT: Lives with: lives with their spouse Lives in: House/apartment Stairs: 2 STE in garage, 4 STE B rails; no steps inside home  Has following equipment at home: Single point cane  OCCUPATION: retired   PLOF: Independent, Independent with basic ADLs, Independent with gait, and Independent with transfers  PATIENT GOALS: improve pain, be able to walk   NEXT MD VISIT: Dr. Eulah Pont sometimes this summer (unsure)  OBJECTIVE:     PATIENT SURVEYS:  FOTO will do 2nd session (pt  very talkative at eval, time limited)  COGNITION: Overall cognitive status: Within functional limits for tasks assessed       MUSCLE LENGTH:  HS: Moderate limitation L, severe limitation R Piriformis: WNL L, moderate limitation R   Slump test negative  No significant LLD  POSTURE: rounded shoulders, forward head, decreased lumbar lordosis, increased thoracic kyphosis, and flexed trunk   PALPATION: No areas TTP R knee, posterior R LE   LOWER EXTREMITY ROM:  Active ROM Right eval Left eval Right / Left 09/25/22 Right / Left 10/11/22  Hip flexion      Hip extension      Hip abduction      Hip adduction      Hip internal rotation      Hip external rotation      Knee flexion 105* 120* 115 / 120 118 / 120  Knee extension 17* 10* 14 / 7 17 / 6  Ankle dorsiflexion      Ankle plantarflexion      Ankle inversion      Ankle eversion       (Blank rows = not tested)  Lumbar flexion WNL, RFIS causes some "I don't like you pain" but nothing beyond normal standing pain  Lumbar extension moderate limitation  Lumbar lateral flexion moderate limitation    LOWER EXTREMITY MMT:  MMT Right eval Left eval  Hip flexion 5 5  Hip extension Unable to tolerate prone  Unable to tolerate prone   Hip  abduction 4+ 4+  Hip adduction    Hip internal rotation    Hip external rotation    Knee flexion 5 5  Knee extension 5 5  Ankle dorsiflexion 5 5  Ankle plantarflexion    Ankle inversion    Ankle eversion     (Blank rows = not tested)    GAIT: Distance walked: in clinic distances  Assistive device utilized: None Level of assistance: Complete Independence Comments: knee flexion in stance and swing phase B, flexed at hips, severe valgus L knee, limited dorsiflexion R ankle, short step lengths and stance times   TODAY'S TREATMENT:                                                                                                                              10/11/22 Pt seen for aquatic therapy today.  Treatment took place in water 3.5-4.75 ft in depth at the [facility] pool. Temp of water was 91.  Pt entered/exited the pool via stairs using step to pattern with bilat hand rail.      -Without  UE support: walking forward, back and side stepping in 46ft, side stepping x 4 widths;   Standing step forward amb with Leg Kick x 2 widths - Flutter Kicking/Windshield Wipers/cycling seated on lift - Standing step forward amb with Leg Kick x 2 widths - Standing Hip Flexion Extension at [employer] x10-15 - Standing Hip Abduction Adduction at [facility]  x10-15 - Standing Hip Hinge  x10 - Squat  x15 relaxed  - Sit to Stand  2 x 5 from 3rd step   Pt requires the buoyancy and hydrostatic pressure of water for support, and to offload joints by unweighting joint load by at least 50 % in navel deep water and by at least 75-80% in chest to neck deep water.  Viscosity of the water is needed for resistance of strengthening. Water current perturbations provides challenge to standing balance requiring increased core activation.    PATIENT EDUCATION:  Education details: aquatic therapy exercise modifications  Person educated: Patient and Spouse Education method: Software engineer Education comprehension: verbalized understanding and returned demonstration  HOME EXERCISE PROGRAM: Has existing exercise program that addresses deficits found on PT eval- will continue with this for now   Aquatic This aquatic home exercise program from [employer] utilizes pictures from land based exercises, but has been adapted prior to lamination and issuance.   Access Code: [email or web address] URL: [email or web address] Date: 10/09/2022 Prepared by: Geni Bers  Exercises - Standing Hip Flexion Extension at [employer]  - 1 x daily - 1-3 x weekly - 1-3 sets - 10 reps - Standing Hip Abduction Adduction at Pool Wall  - 1 x daily - 1-3 x weekly - 1-3 sets - 10 reps - Side to Side Hamstring Stretch with Noodle at Pool Wall  - 1 x daily - 1-3 x weekly - 3 sets - Standing Hip Hinge  - 1 x daily - 1-3 x weekly - 1-3 sets - 10 reps - Squat  - 1 x daily - 1-3 x weekly - 1-3 sets - 10 reps - Flutter Kicking/Windshield Wipers  - 1 x daily - 1-3 x weekly - 3 sets - 10-20 reps - Sit to Stand  - 1 x daily - 1-3 x weekly - 1-3 sets - 10 reps - Standing March with Leg Kick  - 1 x daily - 1-3 x weekly - 1-3 sets - 10 reps Issued 10/09/22 ASSESSMENT:  CLINICAL IMPRESSION: Pt presents today wearing new quad sleeve.  It is a good fit and provides adequate compression.  She states she does believe it is decreasing her post knee pain and feels it is supportive when amb.  Discussed with pt DC vs re-cert.  She is in agreement with DC as shew has access to a pool and will complete final Aquatic HEP as guided through today (remainder of from last session).  She requires cues for progression of exercises if pain begins to increase (back off of exercise being completed). Minor clarifications given and written on program.  She deomstrates safety and indep. She continues to be limited due to pain tolerating max 15 mins standing. She does walk through grocery stores routinely pushing cart for 40  mins.  Pt reports pain decrease in pain daily to 0/10, may last for several hours but then it will spike.  Since therapy initiation her pain intnsity is less as well as episodes less frequent. She is ready for DC   Pt dx with chronic hamstring tear per Dr Katrinka Blazing. She presents today with 2/10 pain.  She is directed through newly created HEP but is limited due to increase discomfort through session. 3-4 width of forward knee kicks appears to trigger.  Once triggered pt has difficulty with remainder of session.  There is no position or activity that reduces. She is issued laminated HEP and is encouraged to complete prior to return to ensure understanding and  indep. May consider short extension of certification to expound on HEP. Will re-assess next session.     OBJECTIVE IMPAIRMENTS: Abnormal gait, decreased activity tolerance, decreased mobility, difficulty walking, decreased ROM, increased muscle spasms, impaired flexibility, improper body mechanics, postural dysfunction, obesity, and pain.   ACTIVITY LIMITATIONS: standing, stairs, transfers, and locomotion level  PARTICIPATION LIMITATIONS: cleaning, laundry, community activity, yard work, and church  PERSONAL FACTORS: Age, Behavior pattern, Education, Fitness, Past/current experiences, Profession, and Time since onset of injury/illness/exacerbation are also affecting patient's functional outcome.   REHAB POTENTIAL: Fair- limited progress/pain relief from PT interventions last year for same body part, chronicity of pain   CLINICAL DECISION MAKING: Stable/uncomplicated  EVALUATION COMPLEXITY: Low   GOALS: Goals reviewed with patient? No  SHORT TERM GOALS: Target date: 09/21/2022   Will be compliant with appropriate progressive HEP  Baseline: Goal status: Met 09/25/22  2.  Stance time and step lengths to be WNL and patient will be able to maintain upright posture with all gait based tasks  Baseline:  Goal status: In progress 09/25/22/ Not  met pt continues to amb with antalgic gait 10/11/22  3.  Muscle flexibility impairments to have improved by 50% in all groups  Baseline:  Goal status: In progress 09/25/22/ Met 10/11/22  4.  Will be able to stand and walk for at least 40 minutes without increase in pain  Baseline:  Goal status: In progress 09/25/22/ Met 7-31/24    LONG TERM GOALS: Target date: 10/12/2022    Will tolerate standing tasks for 75 minutes without increase in RLE pain  Baseline:  Goal status: Not met due to Pt limitation due to pain 10/11/22  2.  FOTO score to be within 5 points of predicted value to show improved subjective improvement  Baseline:  Goal status: deferred 10/11/22  3.  R knee flexion ROM to be equal to that of the left and extension ROM to be within 3 degrees of the L  Baseline:  Goal status: partially Met. No change in right knee ext 10/11/22  4.  Intensity of pain at rest to have improved by 50%  Baseline:  Goal status: Met 10/11/22     PLAN:  PT FREQUENCY: 2x/week  PT DURATION: 6 weeks  PLANNED INTERVENTIONS: Therapeutic exercises, Therapeutic activity, Gait training, Self Care, Aquatic Therapy, Manual therapy, and Re-evaluation  PLAN FOR NEXT SESSION: water therapy focus with progression.  Corrie Dandy Drummond) Shamecca Whitebread MPT 10/11/22 1:24 PM [facility] [address] [place], [place], [address] Phone: [phone]   Fax:  [phone]        Referring diagnosis? Right knee pain M25.561 Treatment diagnosis? (if different than referring diagnosis)   Pain in right leg M79.604  Stiffness of right knee, not elsewhere classified M25.661  Muscle weakness (generalized) M62.81  Difficulty in walking, not elsewhere classified R26.2  What was this (referring dx) caused by? []  Surgery []  Fall [x]  Ongoing issue []  Arthritis []  Other: ____________  Laterality: [x]  Rt []  Lt []  Both  Check all possible CPT codes:  *CHOOSE 10  OR LESS*    [x]  97110 (Therapeutic Exercise)  []  92507 (SLP Treatment)  []  97112 (Neuro Re-ed)   []  92526 (Swallowing Treatment)   [x]  97116 (Gait Training)   []  [id number] (Cognitive Training, 1st 15 minutes) [x]  97140 (Manual Therapy)   []  97130 (Cognitive Training, each add'l 15 minutes)  [x]  97164 (Re-evaluation)                              []   Other, List CPT Code ____________  [x]  97530 (Therapeutic Activities)     [x]  97535 (Self Care)   []  All codes above (97110 - 97535)  []  97012 (Mechanical Traction)  []  97014 (E-stim Unattended)  []  97032 (E-stim manual)  []  96045 (Ionto)  []  97035 (Ultrasound) []  97750 (Physical Performance Training) [x]  [id number] (Aquatic Therapy) []  97016 (Vasopneumatic Device) []  [id number] (Paraffin) []  97034 (Contrast Bath) []  97597 (Wound Care 1st 20 sq cm) []  97598 (Wound Care each add'l 20 sq cm) []  97760 (Orthotic Fabrication, Fitting, Training Initial) []  [id number] (Prosthetic Management and Training Initial) []  [id number] (Orthotic or Prosthetic Training/ Modification Subsequent)

## 2022-10-16 DIAGNOSIS — Z880 Allergy status to penicillin: Secondary | ICD-10-CM | POA: Diagnosis not present

## 2022-10-16 DIAGNOSIS — Z882 Allergy status to sulfonamides status: Secondary | ICD-10-CM | POA: Diagnosis not present

## 2022-10-16 DIAGNOSIS — E669 Obesity, unspecified: Secondary | ICD-10-CM | POA: Diagnosis not present

## 2022-10-16 DIAGNOSIS — G4733 Obstructive sleep apnea (adult) (pediatric): Secondary | ICD-10-CM | POA: Diagnosis not present

## 2022-10-16 DIAGNOSIS — J309 Allergic rhinitis, unspecified: Secondary | ICD-10-CM | POA: Diagnosis not present

## 2022-10-16 DIAGNOSIS — Z886 Allergy status to analgesic agent status: Secondary | ICD-10-CM | POA: Diagnosis not present

## 2022-10-16 DIAGNOSIS — Z6834 Body mass index (BMI) 34.0-34.9, adult: Secondary | ICD-10-CM | POA: Diagnosis not present

## 2022-10-16 DIAGNOSIS — Z888 Allergy status to other drugs, medicaments and biological substances status: Secondary | ICD-10-CM | POA: Diagnosis not present

## 2022-10-16 DIAGNOSIS — K219 Gastro-esophageal reflux disease without esophagitis: Secondary | ICD-10-CM | POA: Diagnosis not present

## 2022-10-16 DIAGNOSIS — Z885 Allergy status to narcotic agent status: Secondary | ICD-10-CM | POA: Diagnosis not present

## 2022-10-16 DIAGNOSIS — E785 Hyperlipidemia, unspecified: Secondary | ICD-10-CM | POA: Diagnosis not present

## 2022-10-16 DIAGNOSIS — I10 Essential (primary) hypertension: Secondary | ICD-10-CM | POA: Diagnosis not present

## 2022-10-16 DIAGNOSIS — M545 Low back pain, unspecified: Secondary | ICD-10-CM | POA: Diagnosis not present

## 2022-10-16 DIAGNOSIS — M810 Age-related osteoporosis without current pathological fracture: Secondary | ICD-10-CM | POA: Diagnosis not present

## 2022-10-16 DIAGNOSIS — Z881 Allergy status to other antibiotic agents status: Secondary | ICD-10-CM | POA: Diagnosis not present

## 2022-10-16 DIAGNOSIS — M1990 Unspecified osteoarthritis, unspecified site: Secondary | ICD-10-CM | POA: Diagnosis not present

## 2022-10-16 DIAGNOSIS — M792 Neuralgia and neuritis, unspecified: Secondary | ICD-10-CM | POA: Diagnosis not present

## 2022-10-16 DIAGNOSIS — Z85828 Personal history of other malignant neoplasm of skin: Secondary | ICD-10-CM | POA: Diagnosis not present

## 2022-10-19 ENCOUNTER — Ambulatory Visit (HOSPITAL_BASED_OUTPATIENT_CLINIC_OR_DEPARTMENT_OTHER): Payer: Medicare PPO | Admitting: Physical Therapy

## 2022-10-25 ENCOUNTER — Ambulatory Visit (HOSPITAL_BASED_OUTPATIENT_CLINIC_OR_DEPARTMENT_OTHER): Payer: Medicare PPO | Admitting: Physical Therapy

## 2022-10-27 ENCOUNTER — Ambulatory Visit (HOSPITAL_BASED_OUTPATIENT_CLINIC_OR_DEPARTMENT_OTHER): Payer: Medicare PPO | Admitting: Physical Therapy

## 2022-11-15 DIAGNOSIS — G250 Essential tremor: Secondary | ICD-10-CM | POA: Diagnosis not present

## 2022-11-15 DIAGNOSIS — E785 Hyperlipidemia, unspecified: Secondary | ICD-10-CM | POA: Diagnosis not present

## 2022-11-15 DIAGNOSIS — R7303 Prediabetes: Secondary | ICD-10-CM | POA: Diagnosis not present

## 2022-11-24 DIAGNOSIS — M25571 Pain in right ankle and joints of right foot: Secondary | ICD-10-CM | POA: Diagnosis not present

## 2022-12-13 NOTE — Progress Notes (Deleted)
Barbara [name] [address] [place] [address] Phone: [phone] Subjective:    I'm seeing this patient by the request  of:  Mila Palmer, MD  CC:   [id number]  09/28/2022 Do believe that the majority of patient's pain is secondary to the arthritic changes of the right knee.  We discussed with patient that there is also the possibility of the hamstring causing some discomfort.  I believe that the lipoma is superficial and not likely contributing to the pain.  We discussed with patient that I do think that the arthritic changes is causing the patient to have the limited range of motion at the moment.  Will follow-up with me again in 6 weeks.  Did discuss proper shoes.  Total time reviewing patient's chart as well as discussing with patient as well as significant other 47 minutes   Also discussed the possibility of physical therapy or aquatic therapy      Update 12/14/2022 Barbara [name] is a 72 y.o. female coming in with complaint of R knee pain. 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, nickel  03/31/2017   Anxiety    Arthritis    knees   Cancer (HCC)    basal cell- nose   GERD (gastroesophageal reflux disease)    Hyperlipidemia    Hypertension    Meningioma (HCC)    Neuromuscular disorder (HCC)    benign tremor- takes Propanolol   PONV (postoperative nausea and vomiting)    "BP DROPS ALSO"   Trigeminal neuralgia of right side of face    Past Surgical History:  Procedure Laterality Date   60 HOUR PH STUDY N/A 04/17/2016   Procedure: 24 HOUR PH STUDY;  Surgeon: Ruffin Frederick, MD;  Location: [facility];  Service: Gastroenterology;  Laterality: N/A;   ESOPHAGEAL MANOMETRY N/A 04/17/2016   Procedure: ESOPHAGEAL MANOMETRY (EM);  Surgeon: Ruffin Frederick, MD;  Location: [facility];  Service: Gastroenterology;  Laterality: N/A;   EXTERNAL FIXATION LEG Left 03/28/2017   [place]    EXTERNAL FIXATION LEG Left 03/28/2017   Procedure: EXTERNAL FIXATION LEG;  Surgeon: Sheral Apley, MD;  Location: [facility] OR;  Service: Orthopedics;  Laterality: Left;   EXTERNAL FIXATION REMOVAL Left 05/08/2017   Procedure: REMOVAL EXTERNAL FIXATION LEFT  LEG WITH HARDWARE REMOVAL;  Surgeon: Sheral Apley, MD;  Location: [facility];  Service: Orthopedics;  Laterality: Left;   fractured femur  05/2012   left - rod -screws placed   HARDWARE REMOVAL Left 04/16/2013   Procedure: REMOVAL OF HARDWARE OF LEFT KNEE (DISTAL INTERLOC SCREW);  Surgeon: Loanne Drilling, MD;  Location: [facility];  Service: Orthopedics;  Laterality: Left;   KNEE SURGERY     x 3 left / x1 rt knee   LEFT HEART CATH AND CORONARY ANGIOGRAPHY N/A 09/18/2016   Procedure: Left Heart Cath and Coronary Angiography;  Surgeon: Tonny Bollman, MD;  Location: [facility] CV [facility];  Service: Cardiovascular;  Laterality: N/A;   NASAL SINUS SURGERY     2000   OTHER SURGICAL HISTORY  2014   titanium rod in left femur used for fracture repair   SHOULDER SURGERY     bilateral shoulders    WISDOM TOOTH EXTRACTION     Social History   Socioeconomic History   Marital status: Married    Spouse name: Not on file   Number of children: 2  Years of education: college   Highest education level: Bachelor's degree (e.g., BA, AB, BS)  Occupational History   Occupation: retired Runner, broadcasting/film/video  Tobacco Use   Smoking status: Never   Smokeless tobacco: Never  Vaping Use   Vaping status: Never Used  Substance and Sexual Activity   Alcohol use: Yes    Comment: RARE   Drug use: No   Sexual activity: Not on file  Other Topics Concern   Not on file  Social History Narrative   Lives at home at home with husband. In a one story home   Right-handed.   No caffeine use.   Social Determinants of Health   Financial Resource Strain: Not on file  Food Insecurity: Not on file  Transportation Needs: Not on file  Physical Activity: Not on file   Stress: Not on file  Social Connections: Unknown (07/23/2021)   Received from [facility]   Social Network    Social Network: Not on file   Allergies  Allergen Reactions   Ace Inhibitors     Pt unsure- nausea or hives   Amoxicillin Hives   Azathioprine Other (See Comments)    "cardiac issues"   Celebrex [Celecoxib] Other (See Comments)    Severe stomach cramps   Crestor [Rosuvastatin] Other (See Comments)    Bilat leg/heel pains/swelling   Enalapril Other (See Comments)    congestion   Fentanyl Nausea And Vomiting   Fish Oil Other (See Comments)    Broke out with blisters   Forteo [Parathyroid Hormone (Recomb)]    Gabapentin     Pain and swelling    Lanolin Acid [Lanolin]     rash   Levofloxacin Other (See Comments)    "sore ankles"   Maxzide [Triamterene-Hctz] Other (See Comments)    Knee, ankles and back pain   Methotrexate Derivatives Other (See Comments)    "cardiac issues"   Nickel     rash   Other Nausea And Vomiting    Narcotic Pain Killers- vomitting, nausea constantly   Penicillins Hives    Has patient had a PCN reaction causing immediate rash, facial/tongue/throat swelling, SOB or lightheadedness with hypotension: unknown Has patient had a PCN reaction causing severe rash involving mucus membranes or skin necrosis: no Has patient had a PCN reaction that required hospitalization - no, was at MD office Has patient had a PCN reaction occurring within the last 10 years: no If all of the above answers are "NO", then may proceed with Cephalosporin use.    Prednisone Other (See Comments)    High blood pressure.   Propoxyphene Hcl Nausea Only   Sulfonamide Derivatives Nausea Only and Other (See Comments)    Stomach cramping   Synvisc [Hylan G-F 20]     Made knee swell up at injection site   Tramadol    Sulfa Antibiotics Nausea Only, Rash and Nausea And Vomiting   Family History  Problem Relation Age of Onset   Stomach cancer Mother    Heart  disease Mother 52       First MI early 41s, CABG    Lung cancer Father        smoked   Allergies Father    Heart attack Father 7   Brain cancer Brother    Colon cancer Neg Hx    Esophageal cancer Neg Hx    Rectal cancer Neg Hx      Current Outpatient Medications (Cardiovascular):    propranolol (INDERAL) 10 MG tablet, Take  10 mg by mouth 3 (three) times daily.  Current Outpatient Medications (Respiratory):    guaiFENesin (MUCINEX) 600 MG 12 hr tablet, Take by mouth as needed.   levocetirizine (XYZAL) 5 MG tablet, Take 5 mg by mouth daily as needed for allergies.    loratadine (CLARITIN) 10 MG tablet, Take 10 mg by mouth daily as needed for allergies.    Current Outpatient Medications (Other):    b complex vitamins tablet, Take 1 tablet by mouth daily.   Cholecalciferol (VITAMIN D-3) 25 MCG (1000 UT) CAPS, Take 1 capsule by mouth daily.   Milk Thistle 250 MG CAPS, Take 1 capsule by mouth 2 (two) times daily.   Multiple Vitamins-Minerals (ICAPS PO), Take 1 capsule by mouth 2 (two) times daily.   omeprazole (PRILOSEC) 20 MG capsule, Take 20 mg by mouth daily.   psyllium (HYDROCIL/METAMUCIL) 95 % PACK, Take 1 packet by mouth 2 (two) times daily.   Reviewed prior external information including notes and imaging from  primary care provider As well as notes that were available from care everywhere and other healthcare systems.  Past medical history, social, surgical and family history all reviewed in electronic medical record.  No pertanent information unless stated regarding to the chief complaint.   Review of Systems:  No headache, visual changes, nausea, vomiting, diarrhea, constipation, dizziness, abdominal pain, skin rash, fevers, chills, night sweats, weight loss, swollen lymph nodes, body aches, joint swelling, chest pain, shortness of breath, mood changes. POSITIVE muscle aches  Objective  There were no vitals taken for this visit.   General: No apparent distress alert  and oriented x3 mood and affect normal, dressed appropriately.  HEENT: Pupils equal, extraocular movements intact  Respiratory: Patient's speak in full sentences and does not appear short of breath  Cardiovascular: No lower extremity edema, non tender, no erythema      Impression and Recommendations:

## 2022-12-14 ENCOUNTER — Ambulatory Visit: Payer: Medicare PPO | Admitting: Family Medicine

## 2022-12-18 DIAGNOSIS — T781XXA Other adverse food reactions, not elsewhere classified, initial encounter: Secondary | ICD-10-CM | POA: Diagnosis not present

## 2022-12-18 DIAGNOSIS — J300 Vasomotor rhinitis: Secondary | ICD-10-CM | POA: Diagnosis not present

## 2022-12-18 DIAGNOSIS — J3089 Other allergic rhinitis: Secondary | ICD-10-CM | POA: Diagnosis not present

## 2022-12-18 DIAGNOSIS — R21 Rash and other nonspecific skin eruption: Secondary | ICD-10-CM | POA: Diagnosis not present

## 2022-12-25 DIAGNOSIS — R251 Tremor, unspecified: Secondary | ICD-10-CM | POA: Diagnosis not present

## 2022-12-25 DIAGNOSIS — M170 Bilateral primary osteoarthritis of knee: Secondary | ICD-10-CM | POA: Diagnosis not present

## 2023-01-02 DIAGNOSIS — M170 Bilateral primary osteoarthritis of knee: Secondary | ICD-10-CM | POA: Diagnosis not present

## 2023-01-08 DIAGNOSIS — M170 Bilateral primary osteoarthritis of knee: Secondary | ICD-10-CM | POA: Diagnosis not present

## 2023-01-23 NOTE — Progress Notes (Signed)
NEUROLOGY FOLLOW UP OFFICE NOTE  ATAVIA VANBELLE [id number]  Subjective:  Barbara [name] is a 72 y.o. year old right-handed female with a medical history of HTN, HLD, allergies, endometrial polyps, OA, osteoporosis, meningioma, GERD who we last saw on 12/22/21 for right leg pain.  To briefly review: Patient was referred by [facility] (Dr. Maisie Fus). Per documentation provided, her symptoms became in fall of 2022 after starting a statin. Within 1 week she had bilateral leg pain with muscle spasms/cramps. She stopped the statin and the symptoms in her left leg resolved. The right leg pain continued however. She describes the pain as a stabbing pain that would go from her buttocks to her heel. It can radiate from the back as well. There is also burning pain in her heel. If she sits too long, she can hardly walk on her leg. She states the achilles tendon is still inflamed. She was found to have a Baker's cyst as well. Around 05/2021 the right leg pain worsened. She only takes tylenol for pain because she is so sensitive to medications.   MRI lumbar spine wo contrast showed modest degenerative disease with L5-S1 spondylolisthesis without significant disc degeneration or stenosis per notes.    She did PT. Things go worse during PT because she couldn't straighten her leg. She had an injection in July 2023 which gave relief, but only for 2 days. She had another injection about 2 weeks ago that helped much more significantly. She does not know what was injected though. DVT scan of the leg was negative.    Patient also endorses neck pain and was scheduled for surgery in the past, but this was cancelled due to her breaking her left leg.   She has gained 15-20 over the last year. She denies wearing tight clothing.   Of note, patient was diagnosed with diffuse anterior bilateral nuclear sclerosis by Dr. Sherryll Burger at [facility] neurolog[facility]. She was previously treated with steroids,  which she did not tolerate well.   Patient also saw Dr. Terrace Arabia at [facility] in 2018 for headaches and essential tremor.   Of note, she has a lot of medication allergies and prior reactions.   The patient has not noticed any recent skin rashes nor does she report any constitutional symptoms like fever, night sweats, anorexia or unintentional weight loss.   EtOH use: None  Restrictive diet? None Family history of neuropathy/myopathy/NM disease? No  Most recent Assessment and Plan (12/22/21): Barbara [name] is a 72 y.o. female who presents for evaluation of right leg pain. She has a relevant medical history of HTN, HLD, allergies, endometrial polyps, OA, osteoporosis, meningioma, GERD. Her neurological examination is pertinent for reduced sensation of right lateral thigh and analgic gait. Her extremity exam is significant for swelling behind right knee, perhaps Baker's cyst and inability to fully extend her right leg. Available diagnostic data is significant for MRI lumbar spine without significant stenosis. 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 This was an unexpected finding on exam and may explain some of her burning pain on the lateral thigh, but would not explain knee pain or pain into buttocks or lower leg. There is no clear other neuropathy or radiculopathy on exam. I will investigate further as below.   PLAN: -Blood work: B6, B12 -EMG of RLE -NMUS of right leg  Since their last visit: B12 was  borderline low, so I recommended B12 1000 mcg daily. EMG and NMUS of the RLE were normal. Patient continues to have right leg pain. She was even seen at [facility]. She was told she has a Baker's cyst, but local doctor does not see fluid on ultrasound. She got a steroid shot that helped very little. She is riding a stationary bike daily. The pain  has improved with exercise.  Patient is here today to discuss tremor in both hands. It is worse in the right hand. This has been present since patient was in high school. It is now getting worse and affecting eating and drinking. She has been treated with propranolol for at least 10 years. She is now on extended release propranolol 60 mg daily. She does not think it is helping much with tremor lately. It is also used for her BP. Per husband, he sometimes sees a tremor in her lips or eyebrows when she is tired. She can also notice a tremor in her right foot when driving at times.  She can hold her hands and suppress the tremor. It is not present at rest. It is also affecting her writing.  She is not sure if she has difficulty with smell. She has difficulty with taste. She denies any REM sleep behavior problems, no pseudobulbar affect. She denies freezing, imbalance, or falls.  Her mother had tremors and her sister has tremors.  MEDICATIONS:  Outpatient Encounter Medications as of 01/31/2023  Medication Sig   b complex vitamins tablet Take 1 tablet by mouth daily.   Cholecalciferol (VITAMIN D-3) 25 MCG (1000 UT) CAPS Take 1 capsule by mouth daily.   guaiFENesin (MUCINEX) 600 MG 12 hr tablet Take by mouth as needed.   levocetirizine (XYZAL) 5 MG tablet Take 5 mg by mouth daily as needed for allergies.    loratadine (CLARITIN) 10 MG tablet Take 10 mg by mouth daily as needed for allergies.   Milk Thistle 250 MG CAPS Take 1 capsule by mouth 2 (two) times daily.   Multiple Vitamins-Minerals (ICAPS PO) Take 1 capsule by mouth 2 (two) times daily.   omeprazole (PRILOSEC) 20 MG capsule Take 20 mg by mouth daily.   propranolol (INDERAL) 10 MG tablet Take 10 mg by mouth 3 (three) times daily.   propranolol ER (INDERAL LA) 60 MG 24 hr capsule Take 60 mg by mouth daily.   psyllium (HYDROCIL/METAMUCIL) 95 % PACK Take 1 packet by mouth 2 (two) times daily.   No facility-administered encounter medications  on file as of 01/31/2023.    PAST MEDICAL HISTORY: 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, nickel  03/31/2017   Anxiety    Arthritis    knees   Cancer (HCC)    basal cell- nose   GERD (gastroesophageal reflux disease)    Hyperlipidemia    Hypertension    Meningioma (HCC)    Neuromuscular disorder (HCC)    benign tremor- takes Propanolol   PONV (postoperative nausea and vomiting)    "BP DROPS ALSO"   Trigeminal neuralgia of right side of face     PAST SURGICAL HISTORY: Past Surgical History:  Procedure Laterality Date   43 HOUR PH STUDY N/A 04/17/2016   Procedure: 24 HOUR PH STUDY;  Surgeon: Ruffin Frederick, MD;  Location: [facility];  Service: Gastroenterology;  Laterality: N/A;   ESOPHAGEAL MANOMETRY N/A 04/17/2016   Procedure: ESOPHAGEAL MANOMETRY (EM);  Surgeon: Ruffin Frederick, MD;  Location: [facility];  Service:  Gastroenterology;  Laterality: N/A;   EXTERNAL FIXATION LEG Left 03/28/2017   [place]   EXTERNAL FIXATION LEG Left 03/28/2017   Procedure: EXTERNAL FIXATION LEG;  Surgeon: Sheral Apley, MD;  Location: [facility] OR;  Service: Orthopedics;  Laterality: Left;   EXTERNAL FIXATION REMOVAL Left 05/08/2017   Procedure: REMOVAL EXTERNAL FIXATION LEFT  LEG WITH HARDWARE REMOVAL;  Surgeon: Sheral Apley, MD;  Location: [facility];  Service: Orthopedics;  Laterality: Left;   fractured femur  05/2012   left - rod -screws placed   HARDWARE REMOVAL Left 04/16/2013   Procedure: REMOVAL OF HARDWARE OF LEFT KNEE (DISTAL INTERLOC SCREW);  Surgeon: Loanne Drilling, MD;  Location: [facility];  Service: Orthopedics;  Laterality: Left;   KNEE SURGERY     x 3 left / x1 rt knee   LEFT HEART CATH AND CORONARY ANGIOGRAPHY N/A 09/18/2016   Procedure: Left Heart Cath and Coronary Angiography;  Surgeon: Tonny Bollman, MD;  Location: [facility] INVASIVE CV LAB;  Service: Cardiovascular;  Laterality: N/A;   NASAL SINUS SURGERY     2000   OTHER SURGICAL  HISTORY  2014   titanium rod in left femur used for fracture repair   SHOULDER SURGERY     bilateral shoulders    WISDOM TOOTH EXTRACTION      ALLERGIES: Allergies  Allergen Reactions   Ace Inhibitors     Pt unsure- nausea or hives   Amoxicillin Hives   Azathioprine Other (See Comments)    "cardiac issues"   Celebrex [Celecoxib] Other (See Comments)    Severe stomach cramps   Crestor [Rosuvastatin] Other (See Comments)    Bilat leg/heel pains/swelling   Enalapril Other (See Comments)    congestion   Fentanyl Nausea And Vomiting   Fish Oil Other (See Comments)    Broke out with blisters   Forteo [Parathyroid Hormone (Recomb)]    Gabapentin     Pain and swelling    Lanolin Acid [Lanolin]     rash   Levofloxacin Other (See Comments)    "sore ankles"   Maxzide [Triamterene-Hctz] Other (See Comments)    Knee, ankles and back pain   Methotrexate Derivatives Other (See Comments)    "cardiac issues"   Nickel     rash   Other Nausea And Vomiting    Narcotic Pain Killers- vomitting, nausea constantly   Penicillins Hives    Has patient had a PCN reaction causing immediate rash, facial/tongue/throat swelling, SOB or lightheadedness with hypotension: unknown Has patient had a PCN reaction causing severe rash involving mucus membranes or skin necrosis: no Has patient had a PCN reaction that required hospitalization - no, was at MD office Has patient had a PCN reaction occurring within the last 10 years: no If all of the above answers are "NO", then may proceed with Cephalosporin use.    Prednisone Other (See Comments)    High blood pressure.   Propoxyphene Hcl Nausea Only   Sulfonamide Derivatives Nausea Only and Other (See Comments)    Stomach cramping   Synvisc [Hylan G-F 20]     Made knee swell up at injection site   Tramadol    Sulfa Antibiotics Nausea Only, Rash and Nausea And Vomiting    FAMILY HISTORY: Family History  Problem Relation Age of Onset   Stomach cancer  Mother    Heart disease Mother 78       First MI early 25s, CABG    Lung cancer Father  smoked   Allergies Father    Heart attack Father 38   Brain cancer Brother    Colon cancer Neg Hx    Esophageal cancer Neg Hx    Rectal cancer Neg Hx     SOCIAL HISTORY: Social History   Tobacco Use   Smoking status: Never   Smokeless tobacco: Never  Vaping Use   Vaping status: Never Used  Substance Use Topics   Alcohol use: Yes    Comment: RARE   Drug use: No   Social History   Social History Narrative   Lives at home at home with husband. In a one story home   Right-handed.   No caffeine use.      Objective:  Vital Signs:  BP (!) 141/83   Pulse 69   Ht 5\' 2"  (1.575 m)   Wt 192 lb 3.2 oz (87.2 kg)   SpO2 97%   BMI 35.15 kg/m   General: No acute distress.  Patient appears well-groomed.   Head:  Normocephalic/atraumatic Neck: supple Lungs: Non-labored breathing on room air   Neurological Exam: Mental status: alert and oriented, speech fluent and not dysarthric, language intact.  Cranial nerves: CN I: not tested CN II: pupils equal, round and reactive to light, visual fields intact CN III, IV, VI:  full range of motion, no nystagmus, no ptosis CN V: facial sensation intact. CN VII: upper and lower face symmetric CN VIII: hearing intact CN IX, X: uvula midline CN XI: sternocleidomastoid and trapezius muscles intact CN XII: tongue midline  Bulk & Tone: normal. Intention tremor seen in bilateral hands (R > L) and occasionally in lips. Motor:  muscle strength 5/5 throughout Deep Tendon Reflexes:  2+ throughout.   Sensation:  Light touch sensation intact. Finger to nose testing:  Without dysmetria. Tremor worsens with outstretched arms. Coordination: Finger and toe tapping normal. No bradykinesia. Gait:  Normal arm swing. Antalgic gait. No freezing or en bloc turns.   Labs and Imaging review: New results: 12/22/21: B12: 314 B6 wnl  05/19/22: BMP  unremarkable CBC unremarkable  EMG (01/31/22): NCV & EMG Findings: Extensive electrodiagnostic evaluation of the right lower limb shows: Right sural and superficial fibular/peroneal sensory responses are within normal limits. Right fibular/peroneal (EDB) and tibial (AH) motor responses are within normal limits. Right H reflex is absent. There is no evidence of active or chronic motor axon loss changes affecting any of the tested muscles. Motor unit configuration and recruitment pattern is within normal limits.   Impression: This is a normal study of the right lower extremity. In particular, there is no electrodiagnostic evidence of a right lumbosacral (L3-S1) motor radiculopathy or large fiber sensorimotor neuropathy.  NMUS (01/31/22): Findings: High frequency (4.0-16.0 MHz) B-mode, nonvascular ultrasound of the right lower limb shows: Cross section areas (CSA) of the right sciatic nerve (popliteal fossa), peroneal nerve (popliteal fossa to fibular head), and tibial nerve (popliteal fossa) are within normal limits.   Impression: This is a normal neuromuscular ultrasound of the right lower limb. Specifically, there is no ultrasonographic evidence of entrapment or other focal pathology along the studied course of the right sciatic nerve (popliteal fossa), peroneal nerve (popliteal fossa to fibular head), or tibial nerve (popliteal fossa).   No other obvious lesion involving the adjacent bone or tendon is identified. No definite vascular abnormalities.  Previously reviewed results: CK (06/25/20): 65 (24 five years prior) ESR (06/25/20): 2 Vit D (04/20/20): 32.7 TSH (04/20/20): 1.40     MRI lumbar spine wo contrast (  external, 09/22/21): Modest degenerative disease with L5-S1 spondylolisthesis without significant disc degeneration or stenosis per notes. I personally reviewed MRI and agree that there is no significant stenosis.   MRI brain w/wo contrast (09/16/16): FINDINGS: Brain: Cerebral  volume within normal limits for age. Few scattered subcentimeter foci of T2/FLAIR hyperintensity present within the periventricular white matter of both cerebral hemispheres, nonspecific, but mild for age. No other focal parenchymal signal abnormality identified. No abnormal foci of restricted diffusion to suggest acute or subacute ischemia. Gray-white matter differentiation well maintained. No areas of encephalomalacia to suggest chronic infarction. No evidence for acute or chronic intracranial hemorrhage.   Small solidly enhancing mass measuring 10 x 8 x 7 mm straddling the anterior falx is most consistent with a small meningioma (series 14, image 40). No associated mass effect. No other mass lesion, mass effect, or midline shift. Ventricles normal size without evidence for hydrocephalus. No extra-axial fluid collection. Major dural sinuses are patent.   No abnormal enhancement. Specifically, no abnormal enhancement seen involving or along the course of either trigeminal nerve. Meckel's caves are symmetric and within normal limits bilaterally. No findings to suggest vascular compression.   Pituitary gland suprasellar region within normal limits. Midline structures intact and normal.   Vascular: Major intracranial vascular flow voids are well maintained and normal.   Skull and upper cervical spine: Craniocervical junction within normal limits. Visualized upper cervical spine unremarkable. Bone marrow signal intensity within normal limits. No scalp soft tissue abnormality.   Sinuses/Orbits: Globes and orbital soft tissues within normal limits. Paranasal sinuses are clear. No mastoid effusion. Inner ear structures normal.   IMPRESSION: 1. Mild nonspecific cerebral white matter changes for age. 2. Small 10 x 8 x 7 mm meningioma straddling the anterior falx without significant mass effect. 3. Otherwise unremarkable and normal brain MRI. No structural findings to explain  patient's symptoms identified.   MRI cervical spine (03/10/12): IMPRESSION:   1.  Small disc protrusion and spurs extending into the right neural  foramen at C7-T1 which could impinge upon the right C8 nerve.  2. Moderate left and slight right foraminal stenosis at C6-7.  3.  Moderate to severe facet arthritis at C2-3 through C4-5 on the  right.   Assessment/Plan:  This is REIANNA BERGSTRESSER, a 72 y.o. female with: Tremor - not present at rest. Worse with movement. Unable to pour water very well. Has a family history of tremor. Findings are consistent with essential tremor, with no evidence of parkinsonism. Patient has been treated with propranolol for tremor and BP for years. I will add primidone today. Right knee pain - EMG and NMUS normal. No obvious neurologic cause.  Borderline low B12  Plan: -Continue propranolol ER 60 mg daily -Start primidone 25 mg daily for 1 week, then increase to 50 mg daily thereafter -Continue B12 1000 mcg daily  Return to clinic in 3 months  Total time spent reviewing records, interview, history/exam, documentation, and coordination of care on day of encounter:  40 min  Barbara Balint, MD

## 2023-01-26 DIAGNOSIS — Z1231 Encounter for screening mammogram for malignant neoplasm of breast: Secondary | ICD-10-CM | POA: Diagnosis not present

## 2023-01-31 ENCOUNTER — Encounter: Payer: Self-pay | Admitting: Neurology

## 2023-01-31 ENCOUNTER — Ambulatory Visit: Payer: Medicare PPO | Admitting: Neurology

## 2023-01-31 VITALS — BP 141/83 | HR 69 | Ht 62.0 in | Wt 192.2 lb

## 2023-01-31 DIAGNOSIS — G250 Essential tremor: Secondary | ICD-10-CM

## 2023-01-31 MED ORDER — PRIMIDONE 50 MG PO TABS: 50.0000 mg | ORAL_TABLET | Freq: Every day | ORAL | 5 refills | Status: DC

## 2023-01-31 NOTE — Patient Instructions (Signed)
I saw you today for tremor. There is no evidence of parkinsonism on your examination. Given your family history and how your tremor looks, the most likely cause of your tremor is essential tremor.  Continue propranolol 60 mg.  We will add primidone 25 mg for 1 week, then increase to 50 mg thereafter. I sent this to your pharmacy.  If you are not seeing significant benefit in a few weeks or having any difficulty with the medication, please let me know.  I will see you back in clinic in about 3 months to check on your symptoms.  Please let me know if you have any questions or concerns in the meantime.   The physicians and staff at [facility] are committed to providing excellent care. You may receive a survey requesting feedback about your experience at our office. We strive to receive "very good" responses to the survey questions. If you feel that your experience would prevent you from giving the office a "very good " response, please contact our office to try to remedy the situation. We may be reached at [phone]. Thank you for taking the time out of your busy day to complete the survey.  Jacquelyne Balint, MD [facility]

## 2023-02-02 ENCOUNTER — Telehealth: Payer: Self-pay | Admitting: Neurology

## 2023-02-02 NOTE — Telephone Encounter (Signed)
Caller stated she just seen [facility] a couple days ago. Pt states Primidone medication did not make her feeling good. Pt stated she is experiencing flu like symptoms, light headed, drained and no energy. Stated medication helped with tremors but did not feel like doing anything with her hands. Pt would like advise

## 2023-03-09 DIAGNOSIS — J069 Acute upper respiratory infection, unspecified: Secondary | ICD-10-CM | POA: Diagnosis not present

## 2023-04-25 ENCOUNTER — Ambulatory Visit: Payer: Medicare PPO | Admitting: Neurology

## 2023-05-14 DIAGNOSIS — H938X2 Other specified disorders of left ear: Secondary | ICD-10-CM | POA: Diagnosis not present

## 2023-05-14 DIAGNOSIS — I10 Essential (primary) hypertension: Secondary | ICD-10-CM | POA: Diagnosis not present

## 2023-05-14 DIAGNOSIS — R5383 Other fatigue: Secondary | ICD-10-CM | POA: Diagnosis not present

## 2023-05-17 DIAGNOSIS — M1712 Unilateral primary osteoarthritis, left knee: Secondary | ICD-10-CM | POA: Diagnosis not present

## 2023-05-17 DIAGNOSIS — M545 Low back pain, unspecified: Secondary | ICD-10-CM | POA: Diagnosis not present

## 2023-05-21 DIAGNOSIS — M7581 Other shoulder lesions, right shoulder: Secondary | ICD-10-CM | POA: Diagnosis not present

## 2023-05-21 DIAGNOSIS — M1712 Unilateral primary osteoarthritis, left knee: Secondary | ICD-10-CM | POA: Diagnosis not present

## 2023-05-31 DIAGNOSIS — G250 Essential tremor: Secondary | ICD-10-CM | POA: Diagnosis not present

## 2023-05-31 DIAGNOSIS — Z0000 Encounter for general adult medical examination without abnormal findings: Secondary | ICD-10-CM | POA: Diagnosis not present

## 2023-05-31 DIAGNOSIS — F331 Major depressive disorder, recurrent, moderate: Secondary | ICD-10-CM | POA: Diagnosis not present

## 2023-05-31 DIAGNOSIS — G720 Drug-induced myopathy: Secondary | ICD-10-CM | POA: Diagnosis not present

## 2023-05-31 DIAGNOSIS — R7303 Prediabetes: Secondary | ICD-10-CM | POA: Diagnosis not present

## 2023-05-31 DIAGNOSIS — E785 Hyperlipidemia, unspecified: Secondary | ICD-10-CM | POA: Diagnosis not present

## 2023-05-31 DIAGNOSIS — I6523 Occlusion and stenosis of bilateral carotid arteries: Secondary | ICD-10-CM | POA: Diagnosis not present

## 2023-05-31 DIAGNOSIS — I10 Essential (primary) hypertension: Secondary | ICD-10-CM | POA: Diagnosis not present

## 2023-05-31 DIAGNOSIS — M818 Other osteoporosis without current pathological fracture: Secondary | ICD-10-CM | POA: Diagnosis not present

## 2023-06-04 NOTE — Progress Notes (Signed)
 NEUROLOGY FOLLOW UP OFFICE NOTE  Barbara [name] [id number]  Subjective:  Barbara [name] is a 73 y.o. year old right-handed female with a medical history of HTN, HLD, allergies, endometrial polyps, OA, osteoporosis, meningioma, GERD who we last saw on 01/31/23 for tremor.  To briefly review: Initial consult (12/22/21) for right leg pain: Patient was referred by [facility] (Dr. Maisie Fus). Per documentation provided, her symptoms became in fall of 2022 after starting a statin. Within 1 week she had bilateral leg pain with muscle spasms/cramps. She stopped the statin and the symptoms in her left leg resolved. The right leg pain continued however. She describes the pain as a stabbing pain that would go from her buttocks to her heel. It can radiate from the back as well. There is also burning pain in her heel. If she sits too long, she can hardly walk on her leg. She states the achilles tendon is still inflamed. She was found to have a Baker's cyst as well. Around 05/2021 the right leg pain worsened. She only takes tylenol for pain because she is so sensitive to medications.   MRI lumbar spine wo contrast showed modest degenerative disease with L5-S1 spondylolisthesis without significant disc degeneration or stenosis per notes.    She did PT. Things go worse during PT because she couldn't straighten her leg. She had an injection in July 2023 which gave relief, but only for 2 days. She had another injection about 2 weeks ago that helped much more significantly. She does not know what was injected though. DVT scan of the leg was negative.    Patient also endorses neck pain and was scheduled for surgery in the past, but this was cancelled due to her breaking her left leg.   She has gained 15-20 over the last year. She denies wearing tight clothing.   Of note, patient was diagnosed with diffuse anterior bilateral nuclear sclerosis by Dr. Sherryll Burger at [facility]. She  was previously treated with steroids, which she did not tolerate well.   Patient also saw Dr. Terrace Arabia at [facility] in 2018 for headaches and essential tremor.   Of note, she has a lot of medication allergies and prior reactions.   The patient has not noticed any recent skin rashes nor does she report any constitutional symptoms like fever, night sweats, anorexia or unintentional weight loss.   EtOH use: None  Restrictive diet? None Family history of neuropathy/myopathy/NM disease? No  01/31/23: B12 was borderline low, so I recommended B12 1000 mcg daily. EMG and NMUS of the RLE were normal. Patient continues to have right leg pain. She was even seen at [facility]. She was told she has a Baker's cyst, but local doctor does not see fluid on ultrasound. She got a steroid shot that helped very little. She is riding a stationary bike daily. The pain has improved with exercise.   Patient is here today to discuss tremor in both hands. It is worse in the right hand. This has been present since patient was in high school. It is now getting worse and affecting eating and drinking. She has been treated with propranolol for at least 10 years. She is now on extended release propranolol 60 mg daily. She does not think it is helping much with tremor lately. It is also used for her BP. Per husband, he sometimes sees a tremor in her lips or eyebrows when she is tired. She can also notice a tremor in her  right foot when driving at times.   She can hold her hands and suppress the tremor. It is not present at rest. It is also affecting her writing.   She is not sure if she has difficulty with smell. She has difficulty with taste. She denies any REM sleep behavior problems, no pseudobulbar affect. She denies freezing, imbalance, or falls.   Her mother had tremors and her sister has tremors.  Most recent Assessment and Plan (01/31/23): This is Barbara [name], a 73 y.o. female with: Tremor - not present at rest. Worse  with movement. Unable to pour water very well. Has a family history of tremor. Findings are consistent with essential tremor, with no evidence of parkinsonism. Patient has been treated with propranolol for tremor and BP for years. I will add primidone today. Right knee pain - EMG and NMUS normal. No obvious neurologic cause.  Borderline low B12   Plan: -Continue propranolol ER 60 mg daily -Start primidone 25 mg daily for 1 week, then increase to 50 mg daily thereafter -Continue B12 1000 mcg daily  Since their last visit: Regarding patient's tremor, she does not remember picking up the prescription and does not think she was taking it. Patient is currently having her propranolol weaned off. During this process she has noticed her tremor has improved. That includes the lip and eye brow quivering.   She has recently had pain and swelling in the left knee. She was having trouble walking. Her orthopedist has drained her knee and got a cortisone injection. She is still having pain in her right leg as previous. She is also noticing pain in her right arm, shoulder, and neck. She has had previous surgeries on her shoulders. She was also supposed to have surgery on neck for bone spurs many years ago.   MEDICATIONS:  Outpatient Encounter Medications as of 06/13/2023  Medication Sig   b complex vitamins tablet Take 1 tablet by mouth daily.   Cholecalciferol (VITAMIN D-3) 25 MCG (1000 UT) CAPS Take 1 capsule by mouth daily.   guaiFENesin (MUCINEX) 600 MG 12 hr tablet Take by mouth as needed.   levocetirizine (XYZAL) 5 MG tablet Take 5 mg by mouth daily as needed for allergies.    loratadine (CLARITIN) 10 MG tablet Take 10 mg by mouth daily as needed for allergies.   Milk Thistle 250 MG CAPS Take 1 capsule by mouth 2 (two) times daily.   Multiple Vitamins-Minerals (ICAPS PO) Take 1 capsule by mouth 2 (two) times daily.   omeprazole (PRILOSEC) 20 MG capsule Take 20 mg by mouth daily.   propranolol (INDERAL)  10 MG tablet Take 10 mg by mouth 3 (three) times daily. (Patient not taking: Reported on 06/13/2023)   psyllium (HYDROCIL/METAMUCIL) 95 % PACK Take 1 packet by mouth 2 (two) times daily.   [DISCONTINUED] primidone (MYSOLINE) 50 MG tablet Take 1 tablet (50 mg total) by mouth daily. Take 1/2 tablet (25 mg) daily for 1 week, then increase to 50 mg daily thereafter   [DISCONTINUED] propranolol ER (INDERAL LA) 60 MG 24 hr capsule Take 60 mg by mouth daily.   No facility-administered encounter medications on file as of 06/13/2023.    PAST MEDICAL HISTORY: 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, nickel  03/31/2017   Anxiety    Arthritis    knees   Cancer (HCC)    basal cell- nose   GERD (gastroesophageal reflux disease)    Hyperlipidemia    Hypertension  Meningioma (HCC)    Neuromuscular disorder (HCC)    benign tremor- takes Propanolol   PONV (postoperative nausea and vomiting)    "BP DROPS ALSO"   Trigeminal neuralgia of right side of face     PAST SURGICAL HISTORY: Past Surgical History:  Procedure Laterality Date   97 HOUR PH STUDY N/A 04/17/2016   Procedure: 24 HOUR PH STUDY;  Surgeon: Ruffin Frederick, MD;  Location: [facility];  Service: Gastroenterology;  Laterality: N/A;   ESOPHAGEAL MANOMETRY N/A 04/17/2016   Procedure: ESOPHAGEAL MANOMETRY (EM);  Surgeon: Ruffin Frederick, MD;  Location: [facility];  Service: Gastroenterology;  Laterality: N/A;   EXTERNAL FIXATION LEG Left 03/28/2017   [place]   EXTERNAL FIXATION LEG Left 03/28/2017   Procedure: EXTERNAL FIXATION LEG;  Surgeon: Sheral Apley, MD;  Location: [facility] OR;  Service: Orthopedics;  Laterality: Left;   EXTERNAL FIXATION REMOVAL Left 05/08/2017   Procedure: REMOVAL EXTERNAL FIXATION LEFT  LEG WITH HARDWARE REMOVAL;  Surgeon: Sheral Apley, MD;  Location: [facility];  Service: Orthopedics;  Laterality: Left;   fractured femur  05/2012   left - rod -screws placed   HARDWARE  REMOVAL Left 04/16/2013   Procedure: REMOVAL OF HARDWARE OF LEFT KNEE (DISTAL INTERLOC SCREW);  Surgeon: Loanne Drilling, MD;  Location: [facility];  Service: Orthopedics;  Laterality: Left;   KNEE SURGERY     x 3 left / x1 rt knee   LEFT HEART CATH AND CORONARY ANGIOGRAPHY N/A 09/18/2016   Procedure: Left Heart Cath and Coronary Angiography;  Surgeon: Tonny Bollman, MD;  Location: [facility] IN[facility] CV LAB;  Service: Cardiovascular;  Laterality: N/A;   NASAL SINUS SURGERY     2000   OTHER SURGICAL HISTORY  2014   titanium rod in left femur used for fracture repair   SHOULDER SURGERY     bilateral shoulders    WISDOM TOOTH EXTRACTION      ALLERGIES: Allergies  Allergen Reactions   Ace Inhibitors     Pt unsure- nausea or hives   Amoxicillin Hives   Azathioprine Other (See Comments)    "cardiac issues"   Celebrex [Celecoxib] Other (See Comments)    Severe stomach cramps   Crestor [Rosuvastatin] Other (See Comments)    Bilat leg/heel pains/swelling   Enalapril Other (See Comments)    congestion   Fentanyl Nausea And Vomiting   Fish Oil Other (See Comments)    Broke out with blisters   Forteo [Parathyroid Hormone (Recomb)]    Gabapentin     Pain and swelling    Lanolin Acid [Lanolin]     rash   Levofloxacin Other (See Comments)    "sore ankles"   Maxzide [Triamterene-Hctz] Other (See Comments)    Knee, ankles and back pain   Methotrexate Derivatives Other (See Comments)    "cardiac issues"   Nickel     rash   Other Nausea And Vomiting    Narcotic Pain Killers- vomitting, nausea constantly   Penicillins Hives    Has patient had a PCN reaction causing immediate rash, facial/tongue/throat swelling, SOB or lightheadedness with hypotension: unknown Has patient had a PCN reaction causing severe rash involving mucus membranes or skin necrosis: no Has patient had a PCN reaction that required hospitalization - no, was at MD office Has patient had a PCN reaction occurring within the last 10  years: no If all of the above answers are "NO", then may proceed with Cephalosporin use.    Prednisone Other (  See Comments)    High blood pressure.   Propoxyphene Hcl Nausea Only   Sulfonamide Derivatives Nausea Only and Other (See Comments)    Stomach cramping   Synvisc [Hylan G-F 20]     Made knee swell up at injection site   Tramadol    Sulfa Antibiotics Nausea Only, Rash and Nausea And Vomiting    FAMILY HISTORY: Family History  Problem Relation Age of Onset   Stomach cancer Mother    Heart disease Mother 69       First MI early 71s, CABG    Lung cancer Father        smoked   Allergies Father    Heart attack Father 48   Brain cancer Brother    Colon cancer Neg Hx    Esophageal cancer Neg Hx    Rectal cancer Neg Hx     SOCIAL HISTORY: Social History   Tobacco Use   Smoking status: Never   Smokeless tobacco: Never  Vaping Use   Vaping status: Never Used  Substance Use Topics   Alcohol use: Yes    Comment: RARE   Drug use: No   Social History   Social History Narrative   Lives at home at home with husband. In a one story home   Right-handed.   No caffeine use.   What is your current occupation? retired          Objective:  Vital Signs:  BP 123/72   Pulse 83   Ht 5' 1.5" (1.562 m)   Wt 193 lb (87.5 kg)   SpO2 97%   BMI 35.88 kg/m   General: No acute distress.  Patient appears well-groomed.   Head:  Normocephalic/atraumatic Neck: supple, positive paraspinal tenderness, very reduced range of motion Heart:  Regular rate and rhythm Lungs:  Clear to auscultation bilaterally Back: No paraspinal tenderness Neurological Exam: alert and oriented.  Speech fluent and not dysarthric, language intact.  CN II-XII intact. Bulk and tone normal, muscle strength 5/5 throughout.  Sensation to light touch intact.  Deep tendon reflexes 2+ throughout.  Finger to nose testing intact. Tremor seen in bilateral hands, worse with movement (left worse than right), not  appreciated at rest. Antalgic gait.   Labs and Imaging review: No new results  Previously reviewed results: 12/22/21: B12: 314 B6 wnl   05/19/22: BMP unremarkable CBC unremarkable   CK (06/25/20): 65 (24 five years prior) ESR (06/25/20): 2 Vit D (04/20/20): 32.7 TSH (04/20/20): 1.40     MRI lumbar spine wo contrast (external, 09/22/21): Modest degenerative disease with L5-S1 spondylolisthesis without significant disc degeneration or stenosis per notes. I personally reviewed MRI and agree that there is no significant stenosis.   MRI brain w/wo contrast (09/16/16): FINDINGS: Brain: Cerebral volume within normal limits for age. Few scattered subcentimeter foci of T2/FLAIR hyperintensity present within the periventricular white matter of both cerebral hemispheres, nonspecific, but mild for age. No other focal parenchymal signal abnormality identified. No abnormal foci of restricted diffusion to suggest acute or subacute ischemia. Gray-white matter differentiation well maintained. No areas of encephalomalacia to suggest chronic infarction. No evidence for acute or chronic intracranial hemorrhage.   Small solidly enhancing mass measuring 10 x 8 x 7 mm straddling the anterior falx is most consistent with a small meningioma (series 14, image 40). No associated mass effect. No other mass lesion, mass effect, or midline shift. Ventricles normal size without evidence for hydrocephalus. No extra-axial fluid collection. Major dural sinuses are patent.  No abnormal enhancement. Specifically, no abnormal enhancement seen involving or along the course of either trigeminal nerve. Meckel's caves are symmetric and within normal limits bilaterally. No findings to suggest vascular compression.   Pituitary gland suprasellar region within normal limits. Midline structures intact and normal.   Vascular: Major intracranial vascular flow voids are well maintained and normal.   Skull and upper  cervical spine: Craniocervical junction within normal limits. Visualized upper cervical spine unremarkable. Bone marrow signal intensity within normal limits. No scalp soft tissue abnormality.   Sinuses/Orbits: Globes and orbital soft tissues within normal limits. Paranasal sinuses are clear. No mastoid effusion. Inner ear structures normal.   IMPRESSION: 1. Mild nonspecific cerebral white matter changes for age. 2. Small 10 x 8 x 7 mm meningioma straddling the anterior falx without significant mass effect. 3. Otherwise unremarkable and normal brain MRI. No structural findings to explain patient's symptoms identified.   MRI cervical spine (03/10/12): IMPRESSION:   1.  Small disc protrusion and spurs extending into the right neural  foramen at C7-T1 which could impinge upon the right C8 nerve.  2. Moderate left and slight right foraminal stenosis at C6-7.  3.  Moderate to severe facet arthritis at C2-3 through C4-5 on the  right.   EMG (01/31/22): NCV & EMG Findings: Extensive electrodiagnostic evaluation of the right lower limb shows: Right sural and superficial fibular/peroneal sensory responses are within normal limits. Right fibular/peroneal (EDB) and tibial (AH) motor responses are within normal limits. Right H reflex is absent. There is no evidence of active or chronic motor axon loss changes affecting any of the tested muscles. Motor unit configuration and recruitment pattern is within normal limits.   Impression: This is a normal study of the right lower extremity. In particular, there is no electrodiagnostic evidence of a right lumbosacral (L3-S1) motor radiculopathy or large fiber sensorimotor neuropathy.   NMUS (01/31/22): Findings: High frequency (4.0-16.0 MHz) B-mode, nonvascular ultrasound of the right lower limb shows: Cross section areas (CSA) of the right sciatic nerve (popliteal fossa), peroneal nerve (popliteal fossa to fibular head), and tibial nerve  (popliteal fossa) are within normal limits.   Impression: This is a normal neuromuscular ultrasound of the right lower limb. Specifically, there is no ultrasonographic evidence of entrapment or other focal pathology along the studied course of the right sciatic nerve (popliteal fossa), peroneal nerve (popliteal fossa to fibular head), or tibial nerve (popliteal fossa).   No other obvious lesion involving the adjacent bone or tendon is identified. No definite vascular abnormalities.  Assessment/Plan:  This is Barbara [name], a 73 y.o. female with: Tremor - consistent with essential tremor. Has been on propranolol for many years. Is now weaning down with improvement of tremor. I prescribed primidone to add to propranolol in 01/2023, but patient does not remember getting it or taking it. We discussed and will monitor tremor. If treatment is needed, primidone would be the next best option. Osteoarthritis - affecting right and left knees and legs and right neck, shoulder and arm. No evidence of neuropathy on EMG to explain pain   Plan: -Will hold off on primidone as patient feels she is improving -F/u with ortho as planned -Fall precautions discussed  Return to clinic as needed  Total time spent reviewing records, interview, history/exam, documentation, and coordination of care on day of encounter:  40 min  Jacquelyne Balint, MD

## 2023-06-13 ENCOUNTER — Ambulatory Visit: Payer: Medicare PPO | Admitting: Neurology

## 2023-06-13 ENCOUNTER — Encounter: Payer: Self-pay | Admitting: Neurology

## 2023-06-13 VITALS — BP 123/72 | HR 83 | Ht 61.5 in | Wt 193.0 lb

## 2023-06-13 DIAGNOSIS — M79604 Pain in right leg: Secondary | ICD-10-CM | POA: Diagnosis not present

## 2023-06-13 DIAGNOSIS — M1990 Unspecified osteoarthritis, unspecified site: Secondary | ICD-10-CM

## 2023-06-13 DIAGNOSIS — G250 Essential tremor: Secondary | ICD-10-CM

## 2023-06-13 DIAGNOSIS — M7121 Synovial cyst of popliteal space [Baker], right knee: Secondary | ICD-10-CM | POA: Diagnosis not present

## 2023-06-13 NOTE — Patient Instructions (Signed)
 Your tremor seems to be improving by your report as you come off of propranolol. You never took the primidone I prescribed last time. I would just continue doing what you are doing as you feel better. We can always try primidone if your tremor gets worse in the future.  Give me a call or send me a message if you would like to try this.  Follow up with ortho as planned for arthritis.  The physicians and staff at [facility] are committed to providing excellent care. You may receive a survey requesting feedback about your experience at our office. We strive to receive "very good" responses to the survey questions. If you feel that your experience would prevent you from giving the office a "very good " response, please contact our office to try to remedy the situation. We may be reached at [phone]. Thank you for taking the time out of your busy day to complete the survey.  Jacquelyne Balint, MD [facility]  Preventing Falls at [facility] are common, often dreaded events in the lives of older people. Aside from the obvious injuries and even death that may result, fall can cause wide-ranging consequences including loss of independence, mental decline, decreased activity and mobility. Younger people are also at risk of falling, especially those with chronic illnesses and fatigue.  Ways to reduce risk for falling Examine diet and medications. Warm foods and alcohol dilate blood vessels, which can lead to dizziness when standing. Sleep aids, antidepressants and pain medications can also increase the likelihood of a fall.  Get a vision exam. Poor vision, cataracts and glaucoma increase the chances of falling.  Check foot gear. Shoes should fit snugly and have a sturdy, nonskid sole and a broad, low heel  Participate in a physician-approved exercise program to build and maintain muscle strength and improve balance and coordination. Programs that use ankle weights or stretch bands are  excellent for muscle-strengthening. Water aerobics programs and low-impact Tai Chi programs have also been shown to improve balance and coordination.  Increase vitamin D intake. Vitamin D improves muscle strength and increases the amount of calcium the body is able to absorb and deposit in bones.  How to prevent falls from common hazards Floors - Remove all loose wires, cords, and throw rugs. Minimize clutter. Make sure rugs are anchored and smooth. Keep furniture in its usual place.  Chairs -- Use chairs with straight backs, armrests and firm seats. Add firm cushions to existing pieces to add height.  Bathroom - Install grab bars and non-skid tape in the tub or shower. Use a bathtub transfer bench or a shower chair with a back support Use an elevated toilet seat and/or safety rails to assist standing from a low surface. Do not use towel racks or bathroom tissue holders to help you stand.  Lighting - Make sure halls, stairways, and entrances are well-lit. Install a night light in your bathroom or hallway. Make sure there is a light switch at the top and bottom of the staircase. Turn lights on if you get up in the middle of the night. Make sure lamps or light switches are within reach of the bed if you have to get up during the night.  Kitchen - Install non-skid rubber mats near the sink and stove. Clean spills immediately. Store frequently used utensils, pots, pans between waist and eye level. This helps prevent reaching and bending. Sit when getting things out of lower cupboards.  Living room/ Bedrooms - Place furniture  with wide spaces in between, giving enough room to move around. Establish a route through the living room that gives you something to hold onto as you walk.  Stairs - Make sure treads, rails, and rugs are secure. Install a rail on both sides of the stairs. If stairs are a threat, it might be helpful to arrange most of your activities on the lower level to reduce the number of times  you must climb the stairs.  Entrances and doorways - Install metal handles on the walls adjacent to the doorknobs of all doors to make it more secure as you travel through the doorway.  Tips for maintaining balance Keep at least one hand free at all times. Try using a backpack or fanny pack to hold things rather than carrying them in your hands. Never carry objects in both hands when walking as this interferes with keeping your balance.  Attempt to swing both arms from front to back while walking. This might require a conscious effort if Parkinson's disease has diminished your movement. It will, however, help you to maintain balance and posture, and reduce fatigue.  Consciously lift your feet off of the ground when walking. Shuffling and dragging of the feet is a common culprit in losing your balance.  When trying to navigate turns, use a "U" technique of facing forward and making a wide turn, rather than pivoting sharply.  Try to stand with your feet shoulder-length apart. When your feet are close together for any length of time, you increase your risk of losing your balance and falling.  Do one thing at a time. Don't try to walk and accomplish another task, such as reading or looking around. The decrease in your automatic reflexes complicates motor function, so the less distraction, the better.  Do not wear rubber or gripping soled shoes, they might "catch" on the floor and cause tripping.  Move slowly when changing positions. Use deliberate, concentrated movements and, if needed, use a grab bar or walking aid. Count 15 seconds between each movement. For example, when rising from a seated position, wait 15 seconds after standing to begin walking.  If balance is a continuous problem, you might want to consider a walking aid such as a cane, walking stick, or walker. Once you've mastered walking with help, you might be ready to try it on your own again.

## 2023-06-27 DIAGNOSIS — M542 Cervicalgia: Secondary | ICD-10-CM | POA: Diagnosis not present

## 2023-06-27 DIAGNOSIS — M25511 Pain in right shoulder: Secondary | ICD-10-CM | POA: Diagnosis not present

## 2023-07-05 DIAGNOSIS — M1712 Unilateral primary osteoarthritis, left knee: Secondary | ICD-10-CM | POA: Diagnosis not present

## 2023-07-05 DIAGNOSIS — M791 Myalgia, unspecified site: Secondary | ICD-10-CM | POA: Diagnosis not present

## 2023-07-06 DIAGNOSIS — M2580 Other specified joint disorders, unspecified joint: Secondary | ICD-10-CM | POA: Diagnosis not present

## 2023-07-06 DIAGNOSIS — R768 Other specified abnormal immunological findings in serum: Secondary | ICD-10-CM | POA: Diagnosis not present

## 2023-07-19 ENCOUNTER — Ambulatory Visit (HOSPITAL_BASED_OUTPATIENT_CLINIC_OR_DEPARTMENT_OTHER): Admitting: Physical Therapy

## 2023-07-23 DIAGNOSIS — M25562 Pain in left knee: Secondary | ICD-10-CM | POA: Diagnosis not present

## 2023-07-23 DIAGNOSIS — M25511 Pain in right shoulder: Secondary | ICD-10-CM | POA: Diagnosis not present

## 2023-07-28 DIAGNOSIS — M25562 Pain in left knee: Secondary | ICD-10-CM | POA: Diagnosis not present

## 2023-07-28 DIAGNOSIS — M25561 Pain in right knee: Secondary | ICD-10-CM | POA: Diagnosis not present

## 2023-08-01 ENCOUNTER — Ambulatory Visit (HOSPITAL_BASED_OUTPATIENT_CLINIC_OR_DEPARTMENT_OTHER): Admitting: Physical Therapy

## 2023-08-02 DIAGNOSIS — M25462 Effusion, left knee: Secondary | ICD-10-CM | POA: Diagnosis not present

## 2023-08-02 DIAGNOSIS — M25562 Pain in left knee: Secondary | ICD-10-CM | POA: Diagnosis not present

## 2023-08-02 DIAGNOSIS — M25511 Pain in right shoulder: Secondary | ICD-10-CM | POA: Diagnosis not present

## 2023-08-02 DIAGNOSIS — M5030 Other cervical disc degeneration, unspecified cervical region: Secondary | ICD-10-CM | POA: Diagnosis not present

## 2023-08-03 DIAGNOSIS — M79604 Pain in right leg: Secondary | ICD-10-CM | POA: Diagnosis not present

## 2023-08-03 DIAGNOSIS — H5789 Other specified disorders of eye and adnexa: Secondary | ICD-10-CM | POA: Diagnosis not present

## 2023-08-03 DIAGNOSIS — M79605 Pain in left leg: Secondary | ICD-10-CM | POA: Diagnosis not present

## 2023-08-03 DIAGNOSIS — R768 Other specified abnormal immunological findings in serum: Secondary | ICD-10-CM | POA: Diagnosis not present

## 2023-08-03 DIAGNOSIS — Z6833 Body mass index (BMI) 33.0-33.9, adult: Secondary | ICD-10-CM | POA: Diagnosis not present

## 2023-08-03 DIAGNOSIS — E669 Obesity, unspecified: Secondary | ICD-10-CM | POA: Diagnosis not present

## 2023-08-03 DIAGNOSIS — M2540 Effusion, unspecified joint: Secondary | ICD-10-CM | POA: Diagnosis not present

## 2023-08-10 DIAGNOSIS — M542 Cervicalgia: Secondary | ICD-10-CM | POA: Diagnosis not present

## 2023-08-14 ENCOUNTER — Ambulatory Visit (HOSPITAL_BASED_OUTPATIENT_CLINIC_OR_DEPARTMENT_OTHER): Admitting: Physical Therapy

## 2023-08-16 ENCOUNTER — Encounter (HOSPITAL_COMMUNITY)
Admission: EM | Disposition: A | Discharge status: Home / Self Care | Payer: Self-pay | Source: Home / Self Care | Attending: Emergency Medicine

## 2023-08-16 ENCOUNTER — Observation Stay (HOSPITAL_COMMUNITY): Admitting: Certified Registered"

## 2023-08-16 ENCOUNTER — Emergency Department (HOSPITAL_BASED_OUTPATIENT_CLINIC_OR_DEPARTMENT_OTHER)

## 2023-08-16 ENCOUNTER — Observation Stay (HOSPITAL_BASED_OUTPATIENT_CLINIC_OR_DEPARTMENT_OTHER)
Admission: EM | Admit: 2023-08-16 | Discharge: 2023-08-19 | Disposition: A | Discharge status: Home / Self Care | Attending: Family Medicine | Admitting: Family Medicine

## 2023-08-16 ENCOUNTER — Other Ambulatory Visit: Payer: Self-pay

## 2023-08-16 ENCOUNTER — Emergency Department (HOSPITAL_BASED_OUTPATIENT_CLINIC_OR_DEPARTMENT_OTHER): Admitting: Radiology

## 2023-08-16 ENCOUNTER — Encounter (HOSPITAL_BASED_OUTPATIENT_CLINIC_OR_DEPARTMENT_OTHER): Payer: Self-pay

## 2023-08-16 DIAGNOSIS — R109 Unspecified abdominal pain: Secondary | ICD-10-CM | POA: Diagnosis present

## 2023-08-16 DIAGNOSIS — K8020 Calculus of gallbladder without cholecystitis without obstruction: Secondary | ICD-10-CM | POA: Diagnosis not present

## 2023-08-16 DIAGNOSIS — K8012 Calculus of gallbladder with acute and chronic cholecystitis without obstruction: Secondary | ICD-10-CM | POA: Diagnosis not present

## 2023-08-16 DIAGNOSIS — K449 Diaphragmatic hernia without obstruction or gangrene: Secondary | ICD-10-CM

## 2023-08-16 DIAGNOSIS — Z79899 Other long term (current) drug therapy: Secondary | ICD-10-CM | POA: Insufficient documentation

## 2023-08-16 DIAGNOSIS — R0789 Other chest pain: Secondary | ICD-10-CM | POA: Diagnosis not present

## 2023-08-16 DIAGNOSIS — R1013 Epigastric pain: Secondary | ICD-10-CM | POA: Diagnosis not present

## 2023-08-16 DIAGNOSIS — Z789 Other specified health status: Secondary | ICD-10-CM | POA: Diagnosis not present

## 2023-08-16 DIAGNOSIS — R1084 Generalized abdominal pain: Secondary | ICD-10-CM | POA: Diagnosis not present

## 2023-08-16 DIAGNOSIS — K8050 Calculus of bile duct without cholangitis or cholecystitis without obstruction: Secondary | ICD-10-CM | POA: Diagnosis not present

## 2023-08-16 DIAGNOSIS — K439 Ventral hernia without obstruction or gangrene: Secondary | ICD-10-CM | POA: Diagnosis not present

## 2023-08-16 DIAGNOSIS — R079 Chest pain, unspecified: Secondary | ICD-10-CM | POA: Diagnosis not present

## 2023-08-16 DIAGNOSIS — K7689 Other specified diseases of liver: Secondary | ICD-10-CM | POA: Diagnosis not present

## 2023-08-16 DIAGNOSIS — Z85828 Personal history of other malignant neoplasm of skin: Secondary | ICD-10-CM | POA: Diagnosis not present

## 2023-08-16 DIAGNOSIS — I10 Essential (primary) hypertension: Secondary | ICD-10-CM | POA: Insufficient documentation

## 2023-08-16 DIAGNOSIS — D259 Leiomyoma of uterus, unspecified: Secondary | ICD-10-CM | POA: Diagnosis not present

## 2023-08-16 DIAGNOSIS — R000 Tachycardia, unspecified: Secondary | ICD-10-CM | POA: Diagnosis not present

## 2023-08-16 HISTORY — PX: ESOPHAGOGASTRODUODENOSCOPY: SHX5428

## 2023-08-16 LAB — CBC
HCT: 41.9 % (ref 36.0–46.0)
Hemoglobin: 13.6 g/dL (ref 12.0–15.0)
MCH: 29.3 pg (ref 26.0–34.0)
MCHC: 32.5 g/dL (ref 30.0–36.0)
MCV: 90.3 fL (ref 80.0–100.0)
Platelets: 289 10*3/uL (ref 150–400)
RBC: 4.64 MIL/uL (ref 3.87–5.11)
RDW: 13 % (ref 11.5–15.5)
WBC: 9.1 10*3/uL (ref 4.0–10.5)
nRBC: 0 % (ref 0.0–0.2)

## 2023-08-16 LAB — COMPREHENSIVE METABOLIC PANEL WITH GFR
ALT: 44 U/L (ref 0–44)
AST: 27 U/L (ref 15–41)
Albumin: 4.5 g/dL (ref 3.5–5.0)
Alkaline Phosphatase: 76 U/L (ref 38–126)
Anion gap: 14 (ref 5–15)
BUN: 15 mg/dL (ref 8–23)
CO2: 22 mmol/L (ref 22–32)
Calcium: 9.4 mg/dL (ref 8.9–10.3)
Chloride: 103 mmol/L (ref 98–111)
Creatinine, Ser: 0.89 mg/dL (ref 0.44–1.00)
GFR, Estimated: >60 mL/min (ref >60)
Glucose, Bld: 127 mg/dL — ABNORMAL HIGH (ref 70–99)
Potassium: 3.3 mmol/L — ABNORMAL LOW (ref 3.5–5.1)
Sodium: 140 mmol/L (ref 135–145)
Total Bilirubin: 0.5 mg/dL (ref 0.0–1.2)
Total Protein: 7.5 g/dL (ref 6.5–8.1)

## 2023-08-16 LAB — LIPASE, BLOOD: Lipase: 46 U/L (ref 11–51)

## 2023-08-16 LAB — TROPONIN T, HIGH SENSITIVITY
Troponin T High Sensitivity: <15 ng/L (ref <19)
Troponin T High Sensitivity: <15 ng/L (ref <19)

## 2023-08-16 SURGERY — EGD (ESOPHAGOGASTRODUODENOSCOPY)
Anesthesia: Monitor Anesthesia Care

## 2023-08-16 MED ORDER — HYDROMORPHONE HCL 1 MG/ML IJ SOLN: 0.5000 mg | INTRAMUSCULAR | Status: DC | PRN

## 2023-08-16 MED ORDER — ALBUTEROL SULFATE (2.5 MG/3ML) 0.083% IN NEBU: 2.5000 mg | INHALATION_SOLUTION | RESPIRATORY_TRACT | Status: DC | PRN

## 2023-08-16 MED ORDER — IOHEXOL 300 MG/ML  SOLN
100.0000 mL | Freq: Once | INTRAMUSCULAR | Status: AC | PRN
  Administered 2023-08-16: 100 mL via INTRAVENOUS

## 2023-08-16 MED ORDER — PROCHLORPERAZINE EDISYLATE 10 MG/2ML IJ SOLN
10.0000 mg | Freq: Once | INTRAMUSCULAR | Status: AC
  Administered 2023-08-16: 10 mg via INTRAVENOUS
  Filled 2023-08-16: qty 2

## 2023-08-16 MED ORDER — LIDOCAINE 2% (20 MG/ML) 5 ML SYRINGE
INTRAMUSCULAR | Status: DC | PRN
  Administered 2023-08-16: 100 mg via INTRAVENOUS

## 2023-08-16 MED ORDER — SODIUM CHLORIDE 0.9 % IV SOLN: INTRAVENOUS | Status: DC

## 2023-08-16 MED ORDER — ENOXAPARIN SODIUM 40 MG/0.4ML IJ SOSY
40.0000 mg | PREFILLED_SYRINGE | INTRAMUSCULAR | Status: DC
  Filled 2023-08-16: qty 0.4

## 2023-08-16 MED ORDER — PANTOPRAZOLE SODIUM 40 MG IV SOLR
40.0000 mg | Freq: Once | INTRAVENOUS | Status: AC
  Administered 2023-08-16: 40 mg via INTRAVENOUS
  Filled 2023-08-16: qty 10

## 2023-08-16 MED ORDER — PANTOPRAZOLE SODIUM 40 MG IV SOLR
40.0000 mg | INTRAVENOUS | Status: DC
  Administered 2023-08-16 – 2023-08-19 (×4): 40 mg via INTRAVENOUS
  Filled 2023-08-16 (×4): qty 10

## 2023-08-16 MED ORDER — ONDANSETRON HCL 4 MG/2ML IJ SOLN: 4.0000 mg | Freq: Four times a day (QID) | INTRAMUSCULAR | Status: DC | PRN

## 2023-08-16 MED ORDER — ONDANSETRON HCL 4 MG/2ML IJ SOLN
4.0000 mg | Freq: Once | INTRAMUSCULAR | Status: AC
  Administered 2023-08-16: 4 mg via INTRAVENOUS
  Filled 2023-08-16: qty 2

## 2023-08-16 MED ORDER — OXYCODONE HCL 5 MG PO TABS: 5.0000 mg | ORAL_TABLET | ORAL | Status: DC | PRN

## 2023-08-16 MED ORDER — MORPHINE SULFATE (PF) 4 MG/ML IV SOLN
4.0000 mg | Freq: Once | INTRAVENOUS | Status: AC
  Administered 2023-08-16: 4 mg via INTRAVENOUS
  Filled 2023-08-16: qty 1

## 2023-08-16 MED ORDER — ONDANSETRON HCL 4 MG PO TABS: 4.0000 mg | ORAL_TABLET | Freq: Four times a day (QID) | ORAL | Status: DC | PRN

## 2023-08-16 MED ORDER — ACETAMINOPHEN 325 MG PO TABS: 650.0000 mg | ORAL_TABLET | Freq: Four times a day (QID) | ORAL | Status: DC | PRN

## 2023-08-16 MED ORDER — PROPOFOL 500 MG/50ML IV EMUL
INTRAVENOUS | Status: AC
  Filled 2023-08-16: qty 50

## 2023-08-16 MED ORDER — HYDROMORPHONE HCL 1 MG/ML IJ SOLN
1.0000 mg | Freq: Once | INTRAMUSCULAR | Status: AC
  Administered 2023-08-16: 1 mg via INTRAVENOUS
  Filled 2023-08-16: qty 1

## 2023-08-16 MED ORDER — ACETAMINOPHEN 650 MG RE SUPP
650.0000 mg | Freq: Four times a day (QID) | RECTAL | Status: DC | PRN
Start: 2023-08-16 — End: 2023-08-18

## 2023-08-16 MED ORDER — PROPOFOL 10 MG/ML IV BOLUS
INTRAVENOUS | Status: DC | PRN
  Administered 2023-08-16: 50 mg via INTRAVENOUS
  Administered 2023-08-16: 80 mg via INTRAVENOUS

## 2023-08-16 NOTE — ED Provider Notes (Signed)
 Patient reassessed at 7 am - pain significantly improved.  Admitted to hospitalist dr Lowell Rude   Agreed to pursue CT abdomen to ensure no SBO - CT ordered  Mild hypoxia was transient after narcotic medications, likely related to narcosis, now improved   Arvilla Birmingham, MD 08/16/23 [phone]

## 2023-08-16 NOTE — Plan of Care (Signed)

## 2023-08-16 NOTE — Transfer of Care (Signed)
 Immediate Anesthesia Transfer of Care Note  Patient: Barbara [name]  Procedure(s) Performed: EGD (ESOPHAGOGASTRODUODENOSCOPY)  Patient Location: PACU  Anesthesia Type:MAC  Level of Consciousness: awake, alert , and oriented  Airway & Oxygen Therapy: Patient Spontanous Breathing and Patient connected to face mask oxygen  Post-op Assessment: Report given to RN and Post -op Vital signs reviewed and stable  Post vital signs: Reviewed and stable  Last Vitals:  Vitals Value Taken Time  BP    Temp    Pulse 81 08/16/23 1353  Resp 14 08/16/23 1353  SpO2 98 % 08/16/23 1353  Vitals shown include unfiled device data.  Last Pain:  Vitals:   08/16/23 1309  TempSrc: Temporal  PainSc: 0-No pain      Patients Stated Pain Goal: 5 (08/16/23 1309)  Complications: No notable events documented.

## 2023-08-16 NOTE — ED Notes (Signed)
 [facility] called for Transport @ 7:33am.  Spoke with Meshia.

## 2023-08-16 NOTE — Anesthesia Postprocedure Evaluation (Signed)
 Anesthesia Post Note  Patient: Barbara [name]  Procedure(s) Performed: EGD (ESOPHAGOGASTRODUODENOSCOPY)     Patient location during evaluation: PACU Anesthesia Type: MAC Level of consciousness: awake and alert Pain management: pain level controlled Vital Signs Assessment: post-procedure vital signs reviewed and stable Respiratory status: spontaneous breathing, nonlabored ventilation, respiratory function stable and patient connected to nasal cannula oxygen Cardiovascular status: stable and blood pressure returned to baseline Postop Assessment: no apparent nausea or vomiting Anesthetic complications: no   No notable events documented.  Last Vitals:  Vitals:   08/16/23 1420 08/16/23 1511  BP: (!) 136/49 128/87  Pulse: 79 80  Resp: 12   Temp:  36.6 C  SpO2: 94% 95%    Last Pain:  Vitals:   08/16/23 1415  TempSrc:   PainSc: 0-No pain                 Leslye Rast

## 2023-08-16 NOTE — Consult Note (Addendum)
 Referring Provider: Dr. Jannette Mend, TRH Primary Care Physician:  Olin Bertin, MD Primary Gastroenterologist:  Dr. General Kenner  Reason for Consultation:  Epigastric abdominal pain  HPI: Barbara [name] is a 73 y.o. female with medical history significant for hypertension, hyperlipidemia, GERD, chronic neuropathic pain being admitted to the hospital with intractable epigastric abdominal pain.  She tells me that for the last several months, she has had chronic extremity discomfort that is felt to be neuropathic, she is followed by neurology and orthopedics for this as an outpatient, has only been taking tylenol  for this, no NSAIDs.  Early this morning at about 1 AM she was woken from sleep with severe epigastric abdominal pain.  It is constant, unrelenting, starting in the center of her upper abdomen and feeling like it radiates like a band around her abdomen on both sides.  It also feels like it is shooting straight through to her back.  She had pain like this once about a month ago, but resolved on its own in a couple of hours.  Has some mild associated nausea.  She does feel bloated/tight in the upper abdomen this morning.  She denies any fevers, chills, chest pain, or vomiting.  She I son omeprazole  20 mg daily at home just for the past several months maybe per her husband, but she denies any daily heartburn or reflux type symptoms.  No dysphagia.    Lab evaluation unrevealing.  RUQ ultrasound:  IMPRESSION: 1. Cholelithiasis without secondary signs of acute cholecystitis. 2. Common bile duct is upper limits of normal measuring 6 mm. 3. Bilobar liver cysts. The largest is in the inferior right lobe measuring 4.6 x 4.4 x 4.6 cm. This dominant cyst contains several internal areas septation, unchanged from 08/02/2022.  CT scan of the abdomen and pelvis with contrast:  IMPRESSION: *No acute findings in the abdomen or pelvis. *Cholelithiasis without gallbladder inflammatory changes  correlate with prior ultrasound. *Small anterior abdominal wall umbilical hernia containing fat. *Calcified uterine fibroid. *Degenerative disc disease L4-L5. *Bilobed cyst in the anterolateral margin of the right lobe of the liver correlates with prior ultrasound measuring 4 x 3.9 cm of no clinical significance. 6 mm cyst in the left lobe of the liver of no clinical significance.  Colonoscopy 09/2013:  Small internal hemorrhoids, repeat in 10 years.  EGD 03/2016:  - Esophagogastric landmarks identified. - Normal esophagus - biopsied to rule out eosinophilic esophagitis - Normal stomach. Biopsied to rule out H pylori. - Normal duodenal bulb and second portion of the duodenum.  Overall, no pathology noted on this exam to account for the patient' s symptoms - etiology includes non- erosive reflux disease ( NERD) or motility disorder.  1. Surgical [P], gastric antrum and body - MILD CHRONIC GASTRITIS. - NEGATIVE FOR HELICOBACTER PYLORI. - NO INTESTINAL METAPLASIA, DYSPLASIA, OR MALIGNANCY. 2. Surgical [P], mid esophagus and distal esophagus - BENIGN SQUAMOUS MUCOSA. - NO INCREASE IN INTRAEPITHELIAL EOSINOPHILS. - NO INTESTINAL METAPLASIA, DYSPLASIA OR MALIGNANCY.  Normal esophageal manometry and pH study in 2018.   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, nickel  03/31/2017   Anxiety    Arthritis    knees   Cancer (HCC)    basal cell- nose   GERD (gastroesophageal reflux disease)    Hyperlipidemia    Hypertension    Meningioma (HCC)    Neuromuscular disorder (HCC)    benign tremor- takes Propanolol   PONV (postoperative nausea and vomiting)    "BP DROPS ALSO"  Trigeminal neuralgia of right side of face     Past Surgical History:  Procedure Laterality Date   71 HOUR PH STUDY N/A 04/17/2016   Procedure: 24 HOUR PH STUDY;  Surgeon: Danette Duos, MD;  Location: [facility];  Service: Gastroenterology;  Laterality: N/A;   ESOPHAGEAL MANOMETRY N/A  04/17/2016   Procedure: ESOPHAGEAL MANOMETRY (EM);  Surgeon: Danette Duos, MD;  Location: [facility];  Service: Gastroenterology;  Laterality: N/A;   EXTERNAL FIXATION LEG Left 03/28/2017   [place]   EXTERNAL FIXATION LEG Left 03/28/2017   Procedure: EXTERNAL FIXATION LEG;  Surgeon: Saundra Curl, MD;  Location: [facility] OR;  Service: Orthopedics;  Laterality: Left;   EXTERNAL FIXATION REMOVAL Left 05/08/2017   Procedure: REMOVAL EXTERNAL FIXATION LEFT  LEG WITH HARDWARE REMOVAL;  Surgeon: Saundra Curl, MD;  Location: [facility];  Service: Orthopedics;  Laterality: Left;   fractured femur  05/2012   left - rod -screws placed   HARDWARE REMOVAL Left 04/16/2013   Procedure: REMOVAL OF HARDWARE OF LEFT KNEE (DISTAL INTERLOC SCREW);  Surgeon: Aurther Blue, MD;  Location: [facility];  Service: Orthopedics;  Laterality: Left;   KNEE SURGERY     x 3 left / x1 rt knee   LEFT HEART CATH AND CORONARY ANGIOGRAPHY N/A 09/18/2016   Procedure: Left Heart Cath and Coronary Angiography;  Surgeon: Arnoldo Lapping, MD;  Location: [facility];  Service: Cardiovascular;  Laterality: N/A;   NASAL SINUS SURGERY     2000   OTHER SURGICAL HISTORY  2014   titanium rod in left femur used for fracture repair   SHOULDER SURGERY     bilateral shoulders    WISDOM TOOTH EXTRACTION      Prior to Admission medications   Medication Sig Start Date End Date Taking? Authorizing Provider  b complex vitamins tablet Take 1 tablet by mouth daily.    [provider]  Cholecalciferol (VITAMIN D-3) 25 MCG (1000 UT) CAPS Take 1 capsule by mouth daily.    [provider]  guaiFENesin (MUCINEX) 600 MG 12 hr tablet Take by mouth as needed.    [provider]  levocetirizine (XYZAL ) 5 MG tablet Take 5 mg by mouth daily as needed for allergies.     [provider]  loratadine  (CLARITIN ) 10 MG tablet Take 10 mg by mouth daily as needed for allergies.    [provider]  Milk Thistle 250 MG CAPS Take 1 capsule by mouth 2 (two) times daily.    [provider]  Multiple Vitamins-Minerals (ICAPS PO) Take 1 capsule by mouth 2 (two) times daily.    [provider]  omeprazole  (PRILOSEC) 20 MG capsule Take 20 mg by mouth daily.    [provider]  propranolol  (INDERAL ) 10 MG tablet Take 10 mg by mouth 3 (three) times daily. Patient not taking: Reported on 06/13/2023    [provider]  psyllium (HYDROCIL/METAMUCIL) 95 % PACK Take 1 packet by mouth 2 (two) times daily.    [provider]    Current Facility-Administered Medications  Medication Dose Route Frequency Provider Last Rate Last Admin   acetaminophen  (TYLENOL ) tablet 650 mg  650 mg Oral Q6H PRN Jannette Mend, Mir M, MD       Or   acetaminophen  (TYLENOL ) suppository 650 mg  650 mg Rectal Q6H PRN Jannette Mend, Mir M, MD       albuterol (PROVENTIL) (2.5 MG/3ML) 0.083% nebulizer solution 2.5 mg  2.5  mg Nebulization Q2H PRN Jannette Mend, Mir M, MD       enoxaparin  (LOVENOX ) injection 40 mg  40 mg Subcutaneous Q24H Jannette Mend, Mir M, MD       ondansetron  (ZOFRAN ) tablet 4 mg  4 mg Oral Q6H PRN Jannette Mend, Mir M, MD       Or   ondansetron  (ZOFRAN ) injection 4 mg  4 mg Intravenous Q6H PRN Jannette Mend, Mir M, MD       pantoprazole  (PROTONIX ) injection 40 mg  40 mg Intravenous Q24H Jannette Mend, Mir M, MD        Allergies as of 08/16/2023 - Review Complete 08/16/2023  Allergen Reaction Noted   Ace inhibitors     Amoxicillin Hives 05/21/2012   Azathioprine Other (See Comments) 01/25/2017   Celebrex [celecoxib] Other (See Comments) 05/21/2012   Crestor  [rosuvastatin ] Other (See Comments) 06/25/2020   Enalapril Other (See Comments) 05/21/2012   Fentanyl  Nausea And Vomiting 02/24/2016   Fish oil Other (See Comments)    Forteo [parathyroid hormone (recomb)]  12/01/2019   Gabapentin   06/14/2016   Lanolin acid [lanolin]  06/01/2016   Levofloxacin Other (See Comments)  01/25/2017   Maxzide [triamterene -hctz] Other (See Comments) 06/25/2020   Methotrexate derivatives Other (See Comments) 01/25/2017   Nickel  06/01/2016   Other Nausea And Vomiting 04/08/2013   Penicillins Hives    Prednisone  Other (See Comments) 01/25/2017   Propoxyphene hcl Nausea Only    Sulfonamide derivatives Nausea Only and Other (See Comments)    Synvisc [hylan g-f 20]  09/10/2013   Tramadol   05/08/2017   Sulfa antibiotics Nausea Only, Rash, and Nausea And Vomiting 08/11/2014    Family History  Problem Relation Age of Onset   Stomach cancer Mother    Heart disease Mother 82       First MI early 73s, CABG    Lung cancer Father        smoked   Allergies Father    Heart attack Father 72   Brain cancer Brother    Colon cancer Neg Hx    Esophageal cancer Neg Hx    Rectal cancer Neg Hx     Social History   Socioeconomic History   Marital status: Married    Spouse name: Not on file   Number of children: 2   Years of education: college   Highest education level: Bachelor's degree (e.g., BA, AB, BS)  Occupational History   Occupation: retired Runner, broadcasting/film/video  Tobacco Use   Smoking status: Never   Smokeless tobacco: Never  Vaping Use   Vaping status: Never Used  Substance and Sexual Activity   Alcohol use: Yes    Comment: RARE   Drug use: No   Sexual activity: Not on file  Other Topics Concern   Not on file  Social History Narrative   Lives at home at home with husband. In a one story home   Right-handed.   No caffeine use.   What is your current occupation? retired       Chief Executive Officer Drivers of Corporate investment banker Strain: Not on [employer] Insecurity: No Food Insecurity (08/16/2023)   Hunger Vital Sign    Worried About Running Out of Food in the Last Year: Never true    Ran Out of Food in the Last Year: Never true  Transportation Needs: Not on file  Physical Activity: Not on file  Stress: Not on file  Social Connections: Socially Integrated (08/16/2023)    Social Connection and Isolation Panel [NHANES]  Frequency of Communication with Friends and Family: More than three times a week    Frequency of Social Gatherings with Friends and Family: More than three times a week    Attends Religious Services: More than 4 times per year    Active Member of [employer] or Organizations: Yes    Attends Engineer, structural: More than 4 times per year    Marital Status: Married  Catering manager Violence: Not At Risk (08/16/2023)   Humiliation, Afraid, Rape, and Kick questionnaire    Fear of Current or Ex-Partner: No    Emotionally Abused: No    Physically Abused: No    Sexually Abused: No    Review of Systems: ROS is O/W negative except as mentioned in HPI.  Physical Exam: Vital signs in last 24 hours: Temp:  [97.8 F (36.6 C)-98.1 F (36.7 C)] 97.8 F (36.6 C) (06/05 0906) Pulse Rate:  [59-88] 86 (06/05 0906) Resp:  [12-24] 13 (06/05 0715) BP: (139-180)/(65-95) 143/84 (06/05 0906) SpO2:  [86 %-100 %] 97 % (06/05 0906) Weight:  [86.2 kg] 86.2 kg (06/05 0312) Last BM Date : 08/16/23 General:  Alert, Well-developed, well-nourished, pleasant and cooperative in NAD Head:  Normocephalic and atraumatic. Eyes:  Sclera clear, no icterus.  Conjunctiva pink. Ears:  Normal auditory acuity. Mouth:  No deformity or lesions.   Lungs:  Clear throughout to auscultation.  No wheezes, crackles, or rhonchi Heart:  Regular rate and rhythm; no murmurs, rubs, or gallops. Abdomen:  Soft, non-distended.  BS present.  Mild epigastric TTP.    Msk:  Symmetrical without gross deformities. Pulses:  Normal pulses noted. Extremities:  Without clubbing or edema. Neurologic:  Alert and oriented x 4;  grossly normal neurologically. Skin:  Intact without significant lesions or rashes. Psych:  Alert and cooperative. Normal mood and affect.  Lab Results: Recent Labs    08/16/23 0316  WBC 9.1  HGB 13.6  HCT 41.9  PLT 289   BMET Recent Labs    08/16/23 0319   NA 140  K 3.3*  CL 103  CO2 22  GLUCOSE 127*  BUN 15  CREATININE 0.89  CALCIUM  9.4   LFT Recent Labs    08/16/23 0319  PROT 7.5  ALBUMIN 4.5  AST 27  ALT 44  ALKPHOS 76  BILITOT 0.5   Studies/Results: CT ABDOMEN PELVIS W CONTRAST Result Date: 08/16/2023 CLINICAL DATA:  Abdominal pain acute nonlocalized EXAM: CT ABDOMEN AND PELVIS WITH CONTRAST TECHNIQUE: Multidetector CT imaging of the abdomen and pelvis was performed using the standard protocol following bolus administration of intravenous contrast. RADIATION DOSE REDUCTION: This exam was performed according to the departmental dose-optimization program which includes automated exposure control, adjustment of the mA and/or kV according to patient size and/or use of iterative reconstruction technique. CONTRAST:  OMNIPAQUE IOHEXOL 300 MG/ML  SOLN COMPARISON:  Ultrasound August 16, 2023 FINDINGS: Lower chest: No acute abnormality. Hepatobiliary: Gallstones without gallbladder inflammatory changes correlate with chronic cholelithiasis and correlate with prior ultrasound Bilobed cyst in the anterolateral margin of the right lobe of the liver correlates with prior ultrasound measuring 4 x 3.9 cm of no clinical significance. 6 mm cyst in the left lobe of the liver of no clinical significance. No other hepatic lesions or biliary dilatation. Pancreas: Unremarkable. No pancreatic ductal dilatation or surrounding inflammatory changes. Spleen: Adrenals/Urinary Tract: Adrenal glands are unremarkable. Kidneys are normal, without renal calculi, focal lesion, or hydronephrosis. Bladder is unremarkable. Stomach/Bowel: Stomach is within normal limits. Appendix appears normal.  No evidence of bowel wall thickening, distention, or inflammatory changes. No evidence of diverticulitis or appendicitis. Vascular/Lymphatic: No significant vascular findings are present. No enlarged abdominal or pelvic lymph nodes. Reproductive: Uterus normal size with a 2 cm  calcification in the left lower lateral uterine segment consistent with calcified fibroid. No other adnexal masses. Bladder normal. Other: No abdominal wall hernia or abnormality. No abdominopelvic ascites. Small anterior abdominal wall umbilical hernia containing fat Musculoskeletal: No acute or significant osseous findings. Degenerative disc disease L4-L5. With a diffuse osteopenia IMPRESSION: *No acute findings in the abdomen or pelvis. *Cholelithiasis without gallbladder inflammatory changes correlate with prior ultrasound. *Small anterior abdominal wall umbilical hernia containing fat. *Calcified uterine fibroid. *Degenerative disc disease L4-L5. *Bilobed cyst in the anterolateral margin of the right lobe of the liver correlates with prior ultrasound measuring 4 x 3.9 cm of no clinical significance. 6 mm cyst in the left lobe of the liver of no clinical significance. Electronically Signed   By: Fredrich Jefferson M.D.   On: 08/16/2023 08:36   US  Abdomen Limited RUQ (LIVER/GB) Result Date: 08/16/2023 CLINICAL DATA:  Epigastric pain. EXAM: ULTRASOUND ABDOMEN LIMITED RIGHT UPPER QUADRANT COMPARISON:  None Available. FINDINGS: Gallbladder: Gallstones identified which measure up to 0.9 cm. No gallbladder wall thickening, pericholecystic fluid or sonographic Murphy's sign. Common bile duct: Diameter: 6.1 mm.  No intrahepatic bile duct dilatation. Liver: Normal parenchymal echogenicity. There are bilobar liver cysts. The largest is in the inferior right lobe measuring 4.6 x 4.4 x 4.6 cm. This dominant cyst contains several internal areas septation, unchanged from 08/02/2022. Smaller cysts have more simple appearance. Portal vein is patent on color Doppler imaging with normal direction of blood flow towards the liver. Other: None. IMPRESSION: 1. Cholelithiasis without secondary signs of acute cholecystitis. 2. Common bile duct is upper limits of normal measuring 6 mm. 3. Bilobar liver cysts. The largest is in the inferior  right lobe measuring 4.6 x 4.4 x 4.6 cm. This dominant cyst contains several internal areas septation, unchanged from 08/02/2022. Electronically Signed   By: Kimberley Penman M.D.   On: 08/16/2023 05:49   DG Chest Port 1 View Result Date: 08/16/2023 CLINICAL DATA:  Chest pain EXAM: PORTABLE CHEST 1 VIEW COMPARISON:  05/19/2022 FINDINGS: The heart size and mediastinal contours are within normal limits. Both lungs are clear. The visualized skeletal structures are unremarkable. IMPRESSION: No active disease. Electronically Signed   By: Worthy Heads M.D.   On: 08/16/2023 03:31    IMPRESSION:  *Epigastric abdominal pain:  Severe, woke her from sleep at 1 AM.  Pain radiates to her back.  Had a similar episode one month ago.  Imaging shows gallstones and upper limits of normal CBD at 6 mm.  LFTs normal.  Story actually sounds more c/w passing gallstone, but will rule out esophagitis, ulcer, etc first as well.  PLAN: -EGD this afternoon. -Pantoprazole  40 mg IV daily.   Martina Sledge. Zehr  08/16/2023, 9:10 AM  GI ATTENDING  History, laboratories, x-rays, prior endoscopy reports, and manometry all personally reviewed.  Patient personally seen and examined.  Agree with comprehensive consultation as outlined above.  The patient's clinical presentation is consistent with biliary colic without acute cholecystitis.  She has been on PPI.  We are performing upper endoscopy as afternoon to rule out other possibilities to explain her abdominal pain. The nature of the procedure, as well as the risks, benefits, and alternatives were carefully and thoroughly reviewed with the patient. Ample time for discussion and questions  allowed. The patient understood, was satisfied, and agreed to proceed. If endoscopy unrevealing, then surgical consult for cholecystectomy.  Murel Arlington. Willey Harrier., M.D. [facility] Division of Gastroenterology

## 2023-08-16 NOTE — H&P (Signed)
 History and Physical  Barbara [name] VHQ:[id number] DOB: 05-Aug-1950 DOA: 08/16/2023  PCP: Olin Bertin, MD   Chief Complaint: Abdominal pain  HPI: Barbara [name] is a 73 y.o. female with medical history significant for hypertension, hyperlipidemia, GERD, chronic neuropathic pain being admitted to the hospital with intractable epigastric abdominal pain.  She tells me that for the last several months, she has had chronic extremity discomfort that is felt to be neuropathic, she is followed by neurology and orthopedics for this as an outpatient.  Early this morning at about 1 AM she was woken from sleep with severe epigastric abdominal pain.  It is constant, unrelenting, starting in the center of her upper abdomen and feeling like it radiates like a band around her abdomen on both sides.  It also feels like it is shooting straight through to her back.  She had pain like this once about a month ago, but resolved on its own in a couple of hours.  Today, the pain has been unrelenting.  Does not seem to be better or worse with p.o. intake, there is some mild associated nausea.  She does feel bloated this morning, but says that she has had normal bowel movements, even this morning.  She denies any fevers, chills, chest pain, or vomiting.  She denies any history of severe acid reflux symptoms, or chronic nausea.  She does take an oral PPI daily.  States that she has had upper endoscopy several years ago, but cannot remember why.  Review of Systems: Please see HPI for pertinent positives and negatives. A complete 10 system review of systems are otherwise negative.  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, nickel  03/31/2017   Anxiety    Arthritis    knees   Cancer (HCC)    basal cell- nose   GERD (gastroesophageal reflux disease)    Hyperlipidemia    Hypertension    Meningioma (HCC)    Neuromuscular disorder (HCC)    benign tremor- takes Propanolol   PONV (postoperative nausea and  vomiting)    "BP DROPS ALSO"   Trigeminal neuralgia of right side of face    Past Surgical History:  Procedure Laterality Date   88 HOUR PH STUDY N/A 04/17/2016   Procedure: 24 HOUR PH STUDY;  Surgeon: Danette Duos, MD;  Location: [facility];  Service: Gastroenterology;  Laterality: N/A;   ESOPHAGEAL MANOMETRY N/A 04/17/2016   Procedure: ESOPHAGEAL MANOMETRY (EM);  Surgeon: Danette Duos, MD;  Location: [facility];  Service: Gastroenterology;  Laterality: N/A;   EXTERNAL FIXATION LEG Left 03/28/2017   [place]   EXTERNAL FIXATION LEG Left 03/28/2017   Procedure: EXTERNAL FIXATION LEG;  Surgeon: Saundra Curl, MD;  Location: [facility] OR;  Service: Orthopedics;  Laterality: Left;   EXTERNAL FIXATION REMOVAL Left 05/08/2017   Procedure: REMOVAL EXTERNAL FIXATION LEFT  LEG WITH HARDWARE REMOVAL;  Surgeon: Saundra Curl, MD;  Location: [facility];  Service: Orthopedics;  Laterality: Left;   fractured femur  05/2012   left - rod -screws placed   HARDWARE REMOVAL Left 04/16/2013   Procedure: REMOVAL OF HARDWARE OF LEFT KNEE (DISTAL INTERLOC SCREW);  Surgeon: Aurther Blue, MD;  Location: [facility];  Service: Orthopedics;  Laterality: Left;   KNEE SURGERY     x 3 left / x1 rt knee   LEFT HEART CATH AND CORONARY ANGIOGRAPHY N/A 09/18/2016   Procedure: Left Heart Cath and Coronary Angiography;  Surgeon: Arnoldo Lapping,  MD;  Location: [facility];  Service: Cardiovascular;  Laterality: N/A;   NASAL SINUS SURGERY     2000   OTHER SURGICAL HISTORY  2014   titanium rod in left femur used for fracture repair   SHOULDER SURGERY     bilateral shoulders    WISDOM TOOTH EXTRACTION     Social History:  reports that she has never smoked. She has never used smokeless tobacco. She reports current alcohol use. She reports that she does not use drugs.  Allergies  Allergen Reactions   Ace Inhibitors     Pt unsure- nausea or hives   Amoxicillin Hives   Azathioprine Other  (See Comments)    "cardiac issues"   Celebrex [Celecoxib] Other (See Comments)    Severe stomach cramps   Crestor  [Rosuvastatin ] Other (See Comments)    Bilat leg/heel pains/swelling   Enalapril Other (See Comments)    congestion   Fentanyl  Nausea And Vomiting   Fish Oil Other (See Comments)    Broke out with blisters   Forteo [Parathyroid Hormone (Recomb)]    Gabapentin      Pain and swelling    Lanolin Acid [Lanolin]     rash   Levofloxacin Other (See Comments)    "sore ankles"   Maxzide [Triamterene -Hctz] Other (See Comments)    Knee, ankles and back pain   Methotrexate Derivatives Other (See Comments)    "cardiac issues"   Nickel     rash   Other Nausea And Vomiting    Narcotic Pain Killers- vomitting, nausea constantly   Penicillins Hives    Has patient had a PCN reaction causing immediate rash, facial/tongue/throat swelling, SOB or lightheadedness with hypotension: unknown Has patient had a PCN reaction causing severe rash involving mucus membranes or skin necrosis: no Has patient had a PCN reaction that required hospitalization - no, was at MD office Has patient had a PCN reaction occurring within the last 10 years: no If all of the above answers are "NO", then may proceed with Cephalosporin use.    Prednisone  Other (See Comments)    High blood pressure.   Propoxyphene Hcl Nausea Only   Sulfonamide Derivatives Nausea Only and Other (See Comments)    Stomach cramping   Synvisc [Hylan G-F 20]     Made knee swell up at injection site   Tramadol     Sulfa Antibiotics Nausea Only, Rash and Nausea And Vomiting    Family History  Problem Relation Age of Onset   Stomach cancer Mother    Heart disease Mother 34       First MI early 27s, CABG    Lung cancer Father        smoked   Allergies Father    Heart attack Father 62   Brain cancer Brother    Colon cancer Neg Hx    Esophageal cancer Neg Hx    Rectal cancer Neg Hx      Prior to Admission medications    Medication Sig Start Date End Date Taking? Authorizing Provider  b complex vitamins tablet Take 1 tablet by mouth daily.    [provider]  Cholecalciferol (VITAMIN D-3) 25 MCG (1000 UT) CAPS Take 1 capsule by mouth daily.    [provider]  guaiFENesin (MUCINEX) 600 MG 12 hr tablet Take by mouth as needed.    [provider]  levocetirizine (XYZAL ) 5 MG tablet Take 5 mg by mouth daily as needed for allergies.     [provider]  loratadine  (CLARITIN ) 10 MG tablet Take 10 mg by mouth daily as needed for allergies.    [provider]  Milk Thistle 250 MG CAPS Take 1 capsule by mouth 2 (two) times daily.    [provider]  Multiple Vitamins-Minerals (ICAPS PO) Take 1 capsule by mouth 2 (two) times daily.    [provider]  omeprazole  (PRILOSEC) 20 MG capsule Take 20 mg by mouth daily.    [provider]  propranolol  (INDERAL ) 10 MG tablet Take 10 mg by mouth 3 (three) times daily. Patient not taking: Reported on 06/13/2023    [provider]  psyllium (HYDROCIL/METAMUCIL) 95 % PACK Take 1 packet by mouth 2 (two) times daily.    [provider]    Physical Exam: BP 139/86   Pulse 83   Temp 97.8 F (36.6 C) (Oral)   Resp 13   Ht 5\' 2"  (1.575 m)   Wt 86.2 kg   SpO2 96%   BMI 34.75 kg/m  General:  Alert, oriented, calm, in no acute distress, her husband is at the bedside Cardiovascular: RRR, no murmurs or rubs, no peripheral edema  Respiratory: clear to auscultation bilaterally, no wheezes, no crackles  Abdomen: soft, nontender, distended, normal bowel tones heard  Skin: dry, no rashes  Musculoskeletal: no joint effusions, normal range of motion  Psychiatric: appropriate affect, normal speech  Neurologic: extraocular muscles intact, clear speech, moving all extremities with intact sensorium         Labs on Admission:  Basic Metabolic Panel: Recent Labs  Lab 08/16/23 0319  NA 140  K 3.3*   CL 103  CO2 22  GLUCOSE 127*  BUN 15  CREATININE 0.89  CALCIUM  9.4   Liver Function Tests: Recent Labs  Lab 08/16/23 0319  AST 27  ALT 44  ALKPHOS 76  BILITOT 0.5  PROT 7.5  ALBUMIN 4.5   Recent Labs  Lab 08/16/23 0319  LIPASE 46   No results for input(s): "AMMONIA" in the last 168 hours. CBC: Recent Labs  Lab 08/16/23 0316  WBC 9.1  HGB 13.6  HCT 41.9  MCV 90.3  PLT 289   Cardiac Enzymes: No results for input(s): "CKTOTAL", "CKMB", "CKMBINDEX", "TROPONINI" in the last 168 hours. BNP (last 3 results) No results for input(s): "BNP" in the last 8760 hours.  ProBNP (last 3 results) No results for input(s): "PROBNP" in the last 8760 hours.  CBG: No results for input(s): "GLUCAP" in the last 168 hours.  Radiological Exams on Admission: US  Abdomen Limited RUQ (LIVER/GB) Result Date: 08/16/2023 CLINICAL DATA:  Epigastric pain. EXAM: ULTRASOUND ABDOMEN LIMITED RIGHT UPPER QUADRANT COMPARISON:  None Available. FINDINGS: Gallbladder: Gallstones identified which measure up to 0.9 cm. No gallbladder wall thickening, pericholecystic fluid or sonographic Murphy's sign. Common bile duct: Diameter: 6.1 mm.  No intrahepatic bile duct dilatation. Liver: Normal parenchymal echogenicity. There are bilobar liver cysts. The largest is in the inferior right lobe measuring 4.6 x 4.4 x 4.6 cm. This dominant cyst contains several internal areas septation, unchanged from 08/02/2022. Smaller cysts have more simple appearance. Portal vein is patent on color Doppler imaging with normal direction of blood flow towards the liver. Other: None. IMPRESSION: 1. Cholelithiasis without secondary signs of acute cholecystitis. 2. Common bile duct is upper limits of normal measuring 6 mm. 3. Bilobar liver cysts. The largest is in the inferior right lobe measuring 4.6 x 4.4 x 4.6 cm. This dominant cyst contains several internal areas septation, unchanged  from 08/02/2022. Electronically Signed   By: Kimberley Penman M.D.   On: 08/16/2023 05:49   DG Chest Port 1 View Result Date: 08/16/2023 CLINICAL DATA:  Chest pain EXAM: PORTABLE CHEST 1 VIEW COMPARISON:  05/19/2022 FINDINGS: The heart size and mediastinal contours are within normal limits. Both lungs are clear. The visualized skeletal structures are unremarkable. IMPRESSION: No active disease. Electronically Signed   By: Worthy Heads M.D.   On: 08/16/2023 03:31   Assessment/Plan Barbara [name] is a 72 y.o. female with medical history significant for hypertension, hyperlipidemia, GERD, chronic neuropathic pain being admitted to the hospital with intractable epigastric abdominal pain.   Abdominal pain-etiology is unclear, thus far broad lab work and imaging is unremarkable.  I have some suspicion for esophagitis, gastritis, or gastric ulcer. -Observation admission -Clear liquid diet -Pain and nausea medication as needed -IV PPI -Discussed with [facility], who will consult today  Hypertension, hyperlipidemia-Home medications will be resumed once reconciled  DVT prophylaxis: Lovenox      Code Status: Full Code  Consults called: [facility]  Admission status: Observation  Time spent: 46 minutes  Dynastie Knoop Rickey Charm MD [facility] Pager [phone]  If 7PM-7AM, please contact night-coverage [email or web address] Password [email or web address]  08/16/2023, 8:22 AM

## 2023-08-16 NOTE — ED Provider Notes (Signed)
 [place] EMERGENCY DEPARTMENT AT [facility]  Provider Note  CSN: [id number] Arrival date & time: 08/16/23 0305  History Chief Complaint  Patient presents with   Chest Pain    Barbara [name] is a 73 y.o. female with history of HTN, HLD, no known CAD (clear coronaries on LHC in 2018) reports sudden onset of lower chest/upper abdominal pain about 2 hours ago. Thought it was gas pains but no improvement with home gas remedies. No SOB. She had one episode of vomiting.    Home Medications Prior to Admission medications   Medication Sig Start Date End Date Taking? Authorizing Provider  b complex vitamins tablet Take 1 tablet by mouth daily.    [provider]  Cholecalciferol (VITAMIN D-3) 25 MCG (1000 UT) CAPS Take 1 capsule by mouth daily.    [provider]  guaiFENesin (MUCINEX) 600 MG 12 hr tablet Take by mouth as needed.    [provider]  levocetirizine (XYZAL ) 5 MG tablet Take 5 mg by mouth daily as needed for allergies.     [provider]  loratadine  (CLARITIN ) 10 MG tablet Take 10 mg by mouth daily as needed for allergies.    [provider]  Milk Thistle 250 MG CAPS Take 1 capsule by mouth 2 (two) times daily.    [provider]  Multiple Vitamins-Minerals (ICAPS PO) Take 1 capsule by mouth 2 (two) times daily.    [provider]  omeprazole  (PRILOSEC) 20 MG capsule Take 20 mg by mouth daily.    [provider]  propranolol  (INDERAL ) 10 MG tablet Take 10 mg by mouth 3 (three) times daily. Patient not taking: Reported on 06/13/2023    [provider]  psyllium (HYDROCIL/METAMUCIL) 95 % PACK Take 1 packet by mouth 2 (two) times daily.    [provider]     Allergies    Ace inhibitors, Amoxicillin, Azathioprine, Celebrex [celecoxib], Crestor  [rosuvastatin ], Enalapril, Fentanyl , Fish oil, Forteo [parathyroid hormone (recomb)], Gabapentin , Lanolin acid [lanolin], Levofloxacin,  Maxzide [triamterene -hctz], Methotrexate derivatives, Nickel, Other, Penicillins, Prednisone , Propoxyphene hcl, Sulfonamide derivatives, Synvisc [hylan g-f 20], Tramadol , and Sulfa antibiotics   Review of Systems   Review of Systems Please see HPI for pertinent positives and negatives  Physical Exam BP (!) 149/79 (BP Location: Right Arm)   Pulse 83   Temp 97.8 F (36.6 C) (Oral)   Resp 12   Ht 5\' 2"  (1.575 m)   Wt 86.2 kg   SpO2 96%   BMI 34.75 kg/m   Physical Exam Vitals and nursing note reviewed.  Constitutional:      Appearance: Normal appearance.  HENT:     Head: Normocephalic and atraumatic.     Nose: Nose normal.     Mouth/Throat:     Mouth: Mucous membranes are moist.  Eyes:     Extraocular Movements: Extraocular movements intact.     Conjunctiva/sclera: Conjunctivae normal.  Cardiovascular:     Rate and Rhythm: Normal rate.  Pulmonary:     Effort: Pulmonary effort is normal.     Breath sounds: Normal breath sounds.  Abdominal:     General: There is no distension.     Palpations: Abdomen is soft.     Tenderness: There is abdominal tenderness (epigastric and RUQ). There is no guarding.  Musculoskeletal:        General: No swelling. Normal range of motion.     Cervical back: Neck supple.  Skin:    General: Skin is warm  and dry.  Neurological:     General: No focal deficit present.     Mental Status: She is alert.  Psychiatric:        Mood and Affect: Mood normal.     ED Results / Procedures / Treatments   EKG EKG Interpretation Date/Time:  Thursday August 16 2023 03:13:24 EDT Ventricular Rate:  62 PR Interval:  150 QRS Duration:  103 QT Interval:  424 QTC Calculation: 431 R Axis:   73  Text Interpretation: Sinus rhythm Normal ECG No significant change since last tracing Confirmed by Shawnee Dellen [phone]) on 08/16/2023 3:21:16 AM  Procedures Procedures  Medications Ordered in the ED Medications  ondansetron  (ZOFRAN ) injection 4 mg (4 mg  Intravenous Given 08/16/23 0322)  morphine  (PF) 4 MG/ML injection 4 mg (4 mg Intravenous Given 08/16/23 0402)  pantoprazole  (PROTONIX ) injection 40 mg (40 mg Intravenous Given 08/16/23 0435)  morphine  (PF) 4 MG/ML injection 4 mg (4 mg Intravenous Given 08/16/23 0439)  ondansetron  (ZOFRAN ) injection 4 mg (4 mg Intravenous Given 08/16/23 0438)  HYDROmorphone  (DILAUDID ) injection 1 mg (1 mg Intravenous Given 08/16/23 0621)  prochlorperazine (COMPAZINE) injection 10 mg (10 mg Intravenous Given 08/16/23 6045)    Initial Impression and Plan  Patient here for chest pain but symptoms seem to be more localized to upper abdomen where she is tender to palpation. Will check labs for ACS as well as biliary disease/pancreatitis. She reports intolerance to opioids. Will give Zofran  for nausea.   ED Course   Clinical Course as of 08/16/23 0703  Thu Aug 16, 2023  0327 CBC is normal.  [CS]  [phone] I personally viewed the images from radiology studies and agree with radiologist interpretation: CXR is clear.  [CS]  0345 Initial Trop is neg.  [CS]  0348 CMP and Lipase are negative.  [CS]  0355 Will send for US  to evaluate gall stones or cholecystitis.  [CS]  [phone] Patient requesting pain medications now. Has had N/V post op from fentanyl , will try Morphine  here.  [CS]  [id number] I personally viewed the images from radiology studies and agree with radiologist interpretation: US  shows gall stones with upper limits of normal CBD. Unchanged liver cysts.  [CS]  0600 Repeat Trop is neg. Patient still having pain, not well controlled with meds here. Will discuss admission with Hospitalist.  [CS]  [phone] Care of the patient signed out at shift change pending hospitalist call back.  [CS]    Clinical Course User Index [CS] Charmayne Cooper, MD     MDM Rules/Calculators/A&P Medical Decision Making Problems Addressed: Calculus of gallbladder without cholecystitis without obstruction: acute illness or injury Epigastric pain: acute  illness or injury  Amount and/or Complexity of Data Reviewed Labs: ordered. Decision-making details documented in ED Course. Radiology: ordered and independent interpretation performed. Decision-making details documented in ED Course. ECG/medicine tests: ordered and independent interpretation performed. Decision-making details documented in ED Course.  Risk Prescription drug management. Parenteral controlled substances. Decision regarding hospitalization.     Final Clinical Impression(s) / ED Diagnoses Final diagnoses:  Epigastric pain  Calculus of gallbladder without cholecystitis without obstruction    Rx / DC Orders ED Discharge Orders     None        Charmayne Cooper, MD 08/16/23 [phone]

## 2023-08-16 NOTE — ED Triage Notes (Signed)
 Chest pain that woke her from sleep 2 hours PTA. Pain feels like it is a band around her chest and between shoulder blades. Vomited shortly after pain began. Abdomen distended on exam.

## 2023-08-16 NOTE — Anesthesia Preprocedure Evaluation (Signed)
 Anesthesia Evaluation  Patient identified by MRN, date of birth, ID band Patient awake    Reviewed: Allergy & Precautions, NPO status , Patient's Chart, lab work & pertinent test results, reviewed documented beta blocker date and time   History of Anesthesia Complications (+) PONV and history of anesthetic complications  Airway Mallampati: II  TM Distance: >3 FB     Dental no notable dental hx.    Pulmonary asthma    breath sounds clear to auscultation       Cardiovascular hypertension, (-) CAD, (-) Past MI, (-) Cardiac Stents, (-) CABG and (-) Peripheral Vascular Disease  Rhythm:Regular Rate:Normal     Neuro/Psych  Headaches, neg Seizures  Anxiety      Neuromuscular disease    GI/Hepatic ,GERD  ,,(+) neg Cirrhosis        Endo/Other    Renal/GU Renal disease     Musculoskeletal  (+) Arthritis ,    Abdominal   Peds  Hematology   Anesthesia Other Findings   Reproductive/Obstetrics                              Anesthesia Physical Anesthesia Plan  ASA: 2  Anesthesia Plan: MAC   Post-op Pain Management:    Induction: Intravenous  PONV Risk Score and Plan: 2 and Ondansetron  and Propofol  infusion  Airway Management Planned: Natural Airway and Nasal Cannula  Additional Equipment:   Intra-op Plan:   Post-operative Plan:   Informed Consent: I have reviewed the patients History and Physical, chart, labs and discussed the procedure including the risks, benefits and alternatives for the proposed anesthesia with the patient or authorized representative who has indicated his/her understanding and acceptance.     Dental advisory given  Plan Discussed with: CRNA  Anesthesia Plan Comments:          Anesthesia Quick Evaluation

## 2023-08-16 NOTE — Op Note (Signed)
 [facility] Patient Name: Barbara [name] Procedure Date: 08/16/2023 MRN: [id number] Attending MD: Murel Arlington. Elvin Hammer , MD, [id number] Date of Birth: Mar 25, 1950 CSN: [id number] Age: 73 Admit Type: Inpatient Procedure:                Upper GI endoscopy Indications:              Epigastric abdominal pain Providers:                Murel Arlington. Elvin Hammer, MD, Suzann Ernst, RN, Marden Shaggy,                            RN, Joline Ned, Technician Referring MD:             [facility] Medicines:                Monitored Anesthesia Care Complications:            No immediate complications. Estimated Blood Loss:     Estimated blood loss: none. Procedure:                Pre-Anesthesia Assessment:                           - Prior to the procedure, a History and Physical                            was performed, and patient medications and                            allergies were reviewed. The patient's tolerance of                            previous anesthesia was also reviewed. The risks                            and benefits of the procedure and the sedation                            options and risks were discussed with the patient.                            All questions were answered, and informed consent                            was obtained. Prior Anticoagulants: The patient has                            taken no anticoagulant or antiplatelet agents. ASA                            Grade Assessment: II - A patient with mild systemic                            disease. After reviewing the risks and benefits,  the patient was deemed in satisfactory condition to                            undergo the procedure.                           After obtaining informed consent, the endoscope was                            passed under direct vision. Throughout the                            procedure, the patient's blood pressure, pulse, and                             oxygen saturations were monitored continuously. The                            [id number]-[id number] ([id number]) Olympus endoscope was introduced                            through the mouth, and advanced to the second part                            of duodenum. The upper GI endoscopy was                            accomplished without difficulty. The patient                            tolerated the procedure well. Scope In: Scope Out: Findings:      The esophagus was normal.      The stomach was normal. Small hiatal hernia.      The examined duodenum was normal.      The cardia and gastric fundus were normal on retroflexion. Impression:               1. Essentially normal EGD                           2. Recurrent epigastric pain felt to be biliary                            colic.                           3. Cholelithiasis without cholecystitis. Moderate Sedation:      none Recommendation:           - Patient has a contact number available for                            emergencies. The signs and symptoms of potential                            delayed complications were discussed with the  patient. Return to normal activities tomorrow.                            Written discharge instructions were provided to the                            patient.                           - Resume previous diet.                           - Continue present medications.                           - Consult general surgery regarding symptomatic                            cholelithiasis and laparoscopic cholecystectomy                           I reviewed the findings and recommendations with                            the patient and the patient's husband Barbara [name].                            I provided the patient with a copy of this report.                            GI will sign off Procedure Code(s):        --- Professional ---                           [phone],  Esophagogastroduodenoscopy, flexible,                            transoral; diagnostic, including collection of                            specimen(s) by brushing or washing, when performed                            (separate procedure) Diagnosis Code(s):        --- Professional ---                           R10.13, Epigastric pain CPT copyright 2022 [employer]. All rights reserved. The codes documented in this report are preliminary and upon coder review may  be revised to meet current compliance requirements. Murel Arlington. Elvin Hammer, MD 08/16/2023 2:00:04 PM This report has been signed electronically. Number of Addenda: 0

## 2023-08-17 ENCOUNTER — Encounter (HOSPITAL_COMMUNITY): Payer: Self-pay | Admitting: Internal Medicine

## 2023-08-17 DIAGNOSIS — K8020 Calculus of gallbladder without cholecystitis without obstruction: Secondary | ICD-10-CM | POA: Diagnosis not present

## 2023-08-17 DIAGNOSIS — R1013 Epigastric pain: Secondary | ICD-10-CM | POA: Diagnosis not present

## 2023-08-17 DIAGNOSIS — K8000 Calculus of gallbladder with acute cholecystitis without obstruction: Secondary | ICD-10-CM | POA: Diagnosis not present

## 2023-08-17 DIAGNOSIS — K449 Diaphragmatic hernia without obstruction or gangrene: Secondary | ICD-10-CM | POA: Diagnosis not present

## 2023-08-17 DIAGNOSIS — K8050 Calculus of bile duct without cholangitis or cholecystitis without obstruction: Secondary | ICD-10-CM | POA: Diagnosis not present

## 2023-08-17 LAB — COMPREHENSIVE METABOLIC PANEL WITH GFR
ALT: 62 U/L — ABNORMAL HIGH (ref 0–44)
AST: 44 U/L — ABNORMAL HIGH (ref 15–41)
Albumin: 3.6 g/dL (ref 3.5–5.0)
Alkaline Phosphatase: 63 U/L (ref 38–126)
Anion gap: 7 (ref 5–15)
BUN: 9 mg/dL (ref 8–23)
CO2: 26 mmol/L (ref 22–32)
Calcium: 9 mg/dL (ref 8.9–10.3)
Chloride: 106 mmol/L (ref 98–111)
Creatinine, Ser: 0.62 mg/dL (ref 0.44–1.00)
GFR, Estimated: >60 mL/min (ref >60)
Glucose, Bld: 102 mg/dL — ABNORMAL HIGH (ref 70–99)
Potassium: 3.6 mmol/L (ref 3.5–5.1)
Sodium: 139 mmol/L (ref 135–145)
Total Bilirubin: 1.5 mg/dL — ABNORMAL HIGH (ref 0.0–1.2)
Total Protein: 6.3 g/dL — ABNORMAL LOW (ref 6.5–8.1)

## 2023-08-17 LAB — CBC
HCT: 39 % (ref 36.0–46.0)
Hemoglobin: 12.8 g/dL (ref 12.0–15.0)
MCH: 30 pg (ref 26.0–34.0)
MCHC: 32.8 g/dL (ref 30.0–36.0)
MCV: 91.3 fL (ref 80.0–100.0)
Platelets: 236 10*3/uL (ref 150–400)
RBC: 4.27 MIL/uL (ref 3.87–5.11)
RDW: 13.2 % (ref 11.5–15.5)
WBC: 8.9 10*3/uL (ref 4.0–10.5)
nRBC: 0 % (ref 0.0–0.2)

## 2023-08-17 MED ORDER — SODIUM CHLORIDE 0.9 % IV SOLN
2.0000 g | Freq: Every day | INTRAVENOUS | Status: DC
  Administered 2023-08-17: 2 g via INTRAVENOUS
  Filled 2023-08-17: qty 20

## 2023-08-17 MED ORDER — HYDRALAZINE HCL 20 MG/ML IJ SOLN: 5.0000 mg | Freq: Four times a day (QID) | INTRAMUSCULAR | Status: DC | PRN

## 2023-08-17 MED ORDER — INDOCYANINE GREEN 25 MG IV SOLR
1.2500 mg | INTRAVENOUS | Status: DC
  Filled 2023-08-17: qty 10

## 2023-08-17 NOTE — TOC Initial Note (Signed)
 Transition of Care [facility]) - Initial/Assessment Note    Patient Details  Name: Barbara [name] MRN: [id number] Date of Birth: 09-22-1950  [facility]) CM/SW Contact:    Kathryn Parish, RN Phone Number: 08/17/2023, 11:20 AM  Clinical Narrative:                 CM spoke with patient in the room. Patient states that PTA she lives in a house with spouse Jeffery;Verified PCP/insurance; DME-walker, cane;No HH,oxygen or SDOH needs; Transportation at discharge will be spouse Sulema Endo. TOC will follow progression for discharge.  Expected Discharge Plan: Home/Self Care Barriers to Discharge: Continued Medical Work up   Patient Goals and CMS Choice Patient states their goals for this hospitalization and ongoing recovery are:: Home with spouse CMS Medicare.gov Compare Post Acute Care list provided to::  (NA) Choice offered to / list presented to : NA [place] ownership interest in [facility].provided to:: Parent NA    Expected Discharge Plan and Services   Discharge Planning Services: CM Consult Post Acute Care Choice: NA Living arrangements for the past 2 months: Single Family Home                 DME Arranged: N/A DME Agency: NA       HH Arranged: NA HH Agency: NA        Prior Living Arrangements/Services Living arrangements for the past 2 months: Single Family Home Lives with:: Spouse Patient language and need for interpreter reviewed:: Yes Do you feel safe going back to the place where you live?: Yes      Need for Family Participation in Patient Care: Yes (Comment) Care giver support system in place?: Yes (comment) Current home services: DME (cane, walker,) Criminal Activity/Legal Involvement Pertinent to Current Situation/Hospitalization: No - Comment as needed  Activities of Daily Living   ADL Screening (condition at time of admission) Independently performs ADLs?: Yes (appropriate for developmental age) Is the patient deaf or have difficulty  hearing?: No Does the patient have difficulty seeing, even when wearing glasses/contacts?: No Does the patient have difficulty concentrating, remembering, or making decisions?: No  Permission Sought/Granted Permission sought to share information with : Case Manager Permission granted to share information with : Yes, Verbal Permission Granted  Share Information with NAME: Sulema Endo     Permission granted to share info w Relationship: spouse     Emotional Assessment Appearance:: Appears stated age Attitude/Demeanor/Rapport: Engaged Affect (typically observed): Appropriate Orientation: : Oriented to Self, Oriented to Place, Oriented to  Time, Oriented to Situation Alcohol / Substance Use: Not Applicable Psych Involvement: No (comment)  Admission diagnosis:  Epigastric pain [R10.13] Calculus of gallbladder without cholecystitis without obstruction [K80.20] Abdominal pain [R10.9] Patient Active Problem List   Diagnosis Date Noted   Abdominal pain 08/16/2023   Calculus of gallbladder without cholecystitis without obstruction 08/16/2023   Recurrent biliary colic 08/16/2023   Hiatal hernia 08/16/2023   Degenerative arthritis of right knee 09/28/2022   Leg pain 08/08/2022   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, nickel  03/31/2017   Closed left tibial fracture 03/28/2017   Closed fracture of left tibial plateau 03/28/2017   Nonintractable headache 01/25/2017   Visual field defect    Chest pain 09/16/2016   Essential tremor 09/16/2016   Right facial numbness 09/16/2016   Bilateral scleritis 08/29/2016   Obesity 06/01/2016   Dyspnea on exertion 05/13/2016   Hoarseness 03/17/2016   Gastroesophageal reflux disease without esophagitis 03/17/2016   Globus sensation 03/17/2016  Gastritis 02/25/2016   Atypical chest pain 02/24/2016   Painful orthopaedic hardware (HCC) 04/15/2013   Vitamin D deficiency 12/09/2009   HYPERCHOLESTEROLEMIA 12/09/2009   Essential hypertension 12/09/2009   Allergic rhinitis  12/09/2009   NEPHROLITHIASIS 12/09/2009   CERVICAL POLYP 12/09/2009   Osteoarthritis 12/09/2009   Cough variant asthma vs UACS  12/09/2009   PCP:  Olin Bertin, MD Pharmacy:   [id number] - [place], [place] - [address] [place] [address] Phone: [phone] Fax: [phone]  [facility] [address] [place] - [address] [place] [address] Phone: [phone] Fax: [phone]     Social Drivers of Health (SDOH) Social History: SDOH Screenings   Food Insecurity: No Food Insecurity (08/16/2023)  Housing: Low Risk  (08/16/2023)  Transportation Needs: No Transportation Needs (08/16/2023)  Utilities: Not At Risk (08/16/2023)  Social Connections: Socially Integrated (08/16/2023)  Tobacco Use: Low Risk  (08/16/2023)   SDOH Interventions:     Readmission Risk Interventions     No data to display

## 2023-08-17 NOTE — Plan of Care (Signed)

## 2023-08-17 NOTE — Progress Notes (Addendum)
 Tentative plans for lap chole 6/7 with Dr Elvan Hamel assuming lfts don't bump significantly  Will discuss procedure/meet pt in AM prior to surgery - again assuming no significant bump in LFTs  Marianna Shirk. Elvan Hamel, MD, FACS General, Bariatric, & Minimally Invasive Surgery [facility],  [facility]

## 2023-08-17 NOTE — Consult Note (Signed)
 CC: abd pain  Requesting provider: Dr Alfonse Angle   HPI: Barbara [name] is an 73 y.o. female who is here for abd pain.  She states that she woke up early Wed AM with epigastric pain that radiated around her abdomen and through to her back.  It was associated with nausea.  She was admitted to the hospital from the ED with normal workup and RUQ US  showing gallstones only.  EGD was completed yesterday to rule out GI pathology.  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, nickel  03/31/2017   Anxiety    Arthritis    knees   Cancer (HCC)    basal cell- nose   GERD (gastroesophageal reflux disease)    Hyperlipidemia    Hypertension    Meningioma (HCC)    Neuromuscular disorder (HCC)    benign tremor- takes Propanolol   PONV (postoperative nausea and vomiting)    "BP DROPS ALSO"   Trigeminal neuralgia of right side of face     Past Surgical History:  Procedure Laterality Date   91 HOUR PH STUDY N/A 04/17/2016   Procedure: 24 HOUR PH STUDY;  Surgeon: Danette Duos, MD;  Location: [facility];  Service: Gastroenterology;  Laterality: N/A;   ESOPHAGEAL MANOMETRY N/A 04/17/2016   Procedure: ESOPHAGEAL MANOMETRY (EM);  Surgeon: Danette Duos, MD;  Location: [facility];  Service: Gastroenterology;  Laterality: N/A;   EXTERNAL FIXATION LEG Left 03/28/2017   [place]   EXTERNAL FIXATION LEG Left 03/28/2017   Procedure: EXTERNAL FIXATION LEG;  Surgeon: Saundra Curl, MD;  Location: [facility];  Service: Orthopedics;  Laterality: Left;   EXTERNAL FIXATION REMOVAL Left 05/08/2017   Procedure: REMOVAL EXTERNAL FIXATION LEFT  LEG WITH HARDWARE REMOVAL;  Surgeon: Saundra Curl, MD;  Location: [facility];  Service: Orthopedics;  Laterality: Left;   fractured femur  05/2012   left - rod -screws placed   HARDWARE REMOVAL Left 04/16/2013   Procedure: REMOVAL OF HARDWARE OF LEFT KNEE (DISTAL INTERLOC SCREW);  Surgeon: Aurther Blue, MD;  Location: [facility];   Service: Orthopedics;  Laterality: Left;   KNEE SURGERY     x 3 left / x1 rt knee   LEFT HEART CATH AND CORONARY ANGIOGRAPHY N/A 09/18/2016   Procedure: Left Heart Cath and Coronary Angiography;  Surgeon: Arnoldo Lapping, MD;  Location: [facility];  Service: Cardiovascular;  Laterality: N/A;   NASAL SINUS SURGERY     2000   OTHER SURGICAL HISTORY  2014   titanium rod in left femur used for fracture repair   SHOULDER SURGERY     bilateral shoulders    WISDOM TOOTH EXTRACTION      Family History  Problem Relation Age of Onset   Stomach cancer Mother    Heart disease Mother 96       First MI early 58s, CABG    Lung cancer Father        smoked   Allergies Father    Heart attack Father 70   Brain cancer Brother    Colon cancer Neg Hx    Esophageal cancer Neg Hx    Rectal cancer Neg Hx     Social:  reports that she has never smoked. She has never used smokeless tobacco. She reports current alcohol use. She reports that she does not use drugs.  Allergies:  Allergies  Allergen Reactions   Ace Inhibitors Hives and Nausea Only  Nasal congestion   Azathioprine Other (See Comments)    "cardiac issues"   Celebrex [Celecoxib] Other (See Comments)    Severe stomach cramps   Crestor  [Rosuvastatin ] Other (See Comments)    Bilat leg/heel pains/swelling   Fish Oil Other (See Comments)    Broke out with blisters   Forteo [Parathyroid Hormone (Recomb)]     Unknown   Gabapentin      Pain and swelling    Lanolin Acid [Lanolin]     rash   Levofloxacin Other (See Comments)    "sore ankles"   Maxzide [Triamterene -Hctz] Other (See Comments)    Knee, ankles and back pain   Methadone Nausea And Vomiting    All narcotic pain killers   Methotrexate Derivatives Other (See Comments)    "cardiac issues"   Nickel     rash   Penicillins Hives    All penicillins   Prednisone  Other (See Comments)    High blood pressure.   Propoxyphene Hcl Nausea Only   Sulfonamide Derivatives Nausea  Only and Other (See Comments)    Stomach cramping   Synvisc [Hylan G-F 20]     Made knee swell up at injection site   Tramadol  Nausea And Vomiting    Medications: I have reviewed the patient's current medications.  Results for orders placed or performed during the hospital encounter of 08/16/23 (from the past 48 hours)  CBC     Status: None   Collection Time: 08/16/23  3:16 AM  Result Value Ref Range   WBC 9.1 4.0 - 10.5 K/uL   RBC 4.64 3.87 - 5.11 MIL/uL   Hemoglobin 13.6 12.0 - 15.0 g/dL   HCT 86.5 78.4 - 69.6 %   MCV 90.3 80.0 - 100.0 fL   MCH 29.3 26.0 - 34.0 pg   MCHC 32.5 30.0 - 36.0 g/dL   RDW 29.5 28.4 - 13.2 %   Platelets 289 150 - 400 K/uL   nRBC 0.0 0.0 - 0.2 %    Comment: Performed at [facility], [address] [place], [place] [address]  Troponin T, High Sensitivity     Status: None   Collection Time: 08/16/23  3:16 AM  Result Value Ref Range   Troponin T High Sensitivity <15 <19 ng/L    Comment: (NOTE) Biotin concentrations > 1000 ng/mL falsely decrease TnT results.  Serial cardiac troponin measurements are suggested.  Refer to the Links section for chest pain algorithms and additional  guidance. Performed at [facility], [address] [place], [place] [address]   Comprehensive metabolic panel     Status: Abnormal   Collection Time: 08/16/23  3:19 AM  Result Value Ref Range   Sodium 140 135 - 145 mmol/L   Potassium 3.3 (L) 3.5 - 5.1 mmol/L   Chloride 103 98 - 111 mmol/L   CO2 22 22 - 32 mmol/L   Glucose, Bld 127 (H) 70 - 99 mg/dL    Comment: Glucose reference range applies only to samples taken after fasting for at least 8 hours.   BUN 15 8 - 23 mg/dL   Creatinine, Ser 6.64 0.44 - 1.00 mg/dL   Calcium  9.4 8.9 - 10.3 mg/dL   Total Protein 7.5 6.5 - 8.1 g/dL   Albumin 4.5 3.5 - 5.0 g/dL   AST 27 15 - 41 U/L   ALT 44 0 - 44 U/L   Alkaline Phosphatase 76 38 - 126 U/L   Total Bilirubin 0.5 0.0 - 1.2 mg/dL  GFR, Estimated >60 >60 mL/min    Comment: (NOTE) Calculated using the CKD-EPI Creatinine Equation (2021)    Anion gap 14 5 - 15    Comment: Performed at [facility], [address] [place], [place] [address]  Lipase, blood     Status: None   Collection Time: 08/16/23  3:19 AM  Result Value Ref Range   Lipase 46 11 - 51 U/L    Comment: Performed at [facility], [address] [place], [place] [address]  Troponin T, High Sensitivity     Status: None   Collection Time: 08/16/23  5:30 AM  Result Value Ref Range   Troponin T High Sensitivity <15 <19 ng/L    Comment: (NOTE) Biotin concentrations > 1000 ng/mL falsely decrease TnT results.  Serial cardiac troponin measurements are suggested.  Refer to the Links section for chest pain algorithms and additional  guidance. Performed at [facility], [address] [place], [place] [address]   Comprehensive metabolic panel     Status: Abnormal   Collection Time: 08/17/23  4:03 AM  Result Value Ref Range   Sodium 139 135 - 145 mmol/L   Potassium 3.6 3.5 - 5.1 mmol/L   Chloride 106 98 - 111 mmol/L   CO2 26 22 - 32 mmol/L   Glucose, Bld 102 (H) 70 - 99 mg/dL    Comment: Glucose reference range applies only to samples taken after fasting for at least 8 hours.   BUN 9 8 - 23 mg/dL   Creatinine, Ser 8.65 0.44 - 1.00 mg/dL   Calcium  9.0 8.9 - 10.3 mg/dL   Total Protein 6.3 (L) 6.5 - 8.1 g/dL   Albumin 3.6 3.5 - 5.0 g/dL   AST 44 (H) 15 - 41 U/L   ALT 62 (H) 0 - 44 U/L   Alkaline Phosphatase 63 38 - 126 U/L   Total Bilirubin 1.5 (H) 0.0 - 1.2 mg/dL   GFR, Estimated >78 >46 mL/min    Comment: (NOTE) Calculated using the CKD-EPI Creatinine Equation (2021)    Anion gap 7 5 - 15    Comment: Performed at [facility], [address] [place], [place] [address]  CBC     Status: None   Collection Time: 08/17/23  4:03 AM  Result Value Ref Range   WBC 8.9 4.0 -  10.5 K/uL   RBC 4.27 3.87 - 5.11 MIL/uL   Hemoglobin 12.8 12.0 - 15.0 g/dL   HCT 28.4 13.2 - 44.0 %   MCV 91.3 80.0 - 100.0 fL   MCH 30.0 26.0 - 34.0 pg   MCHC 32.8 30.0 - 36.0 g/dL   RDW 10.2 72.5 - 36.6 %   Platelets 236 150 - 400 K/uL   nRBC 0.0 0.0 - 0.2 %    Comment: Performed at [facility], [address] [place], [place] [address]    CT ABDOMEN PELVIS W CONTRAST Result Date: 08/16/2023 CLINICAL DATA:  Abdominal pain acute nonlocalized EXAM: CT ABDOMEN AND PELVIS WITH CONTRAST TECHNIQUE: Multidetector CT imaging of the abdomen and pelvis was performed using the standard protocol following bolus administration of intravenous contrast. RADIATION DOSE REDUCTION: This exam was performed according to the departmental dose-optimization program which includes automated exposure control, adjustment of the mA and/or kV according to patient size and/or use of iterative reconstruction technique. CONTRAST:  OMNIPAQUE IOHEXOL 300 MG/ML  SOLN COMPARISON:  Ultrasound August 16, 2023 FINDINGS: Lower chest: No acute abnormality. Hepatobiliary: Gallstones  without gallbladder inflammatory changes correlate with chronic cholelithiasis and correlate with prior ultrasound Bilobed cyst in the anterolateral margin of the right lobe of the liver correlates with prior ultrasound measuring 4 x 3.9 cm of no clinical significance. 6 mm cyst in the left lobe of the liver of no clinical significance. No other hepatic lesions or biliary dilatation. Pancreas: Unremarkable. No pancreatic ductal dilatation or surrounding inflammatory changes. Spleen: Adrenals/Urinary Tract: Adrenal glands are unremarkable. Kidneys are normal, without renal calculi, focal lesion, or hydronephrosis. Bladder is unremarkable. Stomach/Bowel: Stomach is within normal limits. Appendix appears normal. No evidence of bowel wall thickening, distention, or inflammatory changes. No evidence of diverticulitis or appendicitis.  Vascular/Lymphatic: No significant vascular findings are present. No enlarged abdominal or pelvic lymph nodes. Reproductive: Uterus normal size with a 2 cm calcification in the left lower lateral uterine segment consistent with calcified fibroid. No other adnexal masses. Bladder normal. Other: No abdominal wall hernia or abnormality. No abdominopelvic ascites. Small anterior abdominal wall umbilical hernia containing fat Musculoskeletal: No acute or significant osseous findings. Degenerative disc disease L4-L5. With a diffuse osteopenia IMPRESSION: *No acute findings in the abdomen or pelvis. *Cholelithiasis without gallbladder inflammatory changes correlate with prior ultrasound. *Small anterior abdominal wall umbilical hernia containing fat. *Calcified uterine fibroid. *Degenerative disc disease L4-L5. *Bilobed cyst in the anterolateral margin of the right lobe of the liver correlates with prior ultrasound measuring 4 x 3.9 cm of no clinical significance. 6 mm cyst in the left lobe of the liver of no clinical significance. Electronically Signed   By: Fredrich Jefferson M.D.   On: 08/16/2023 08:36   US  Abdomen Limited RUQ (LIVER/GB) Result Date: 08/16/2023 CLINICAL DATA:  Epigastric pain. EXAM: ULTRASOUND ABDOMEN LIMITED RIGHT UPPER QUADRANT COMPARISON:  None Available. FINDINGS: Gallbladder: Gallstones identified which measure up to 0.9 cm. No gallbladder wall thickening, pericholecystic fluid or sonographic Murphy's sign. Common bile duct: Diameter: 6.1 mm.  No intrahepatic bile duct dilatation. Liver: Normal parenchymal echogenicity. There are bilobar liver cysts. The largest is in the inferior right lobe measuring 4.6 x 4.4 x 4.6 cm. This dominant cyst contains several internal areas septation, unchanged from 08/02/2022. Smaller cysts have more simple appearance. Portal vein is patent on color Doppler imaging with normal direction of blood flow towards the liver. Other: None. IMPRESSION: 1. Cholelithiasis without  secondary signs of acute cholecystitis. 2. Common bile duct is upper limits of normal measuring 6 mm. 3. Bilobar liver cysts. The largest is in the inferior right lobe measuring 4.6 x 4.4 x 4.6 cm. This dominant cyst contains several internal areas septation, unchanged from 08/02/2022. Electronically Signed   By: Kimberley Penman M.D.   On: 08/16/2023 05:49   DG Chest Port 1 View Result Date: 08/16/2023 CLINICAL DATA:  Chest pain EXAM: PORTABLE CHEST 1 VIEW COMPARISON:  05/19/2022 FINDINGS: The heart size and mediastinal contours are within normal limits. Both lungs are clear. The visualized skeletal structures are unremarkable. IMPRESSION: No active disease. Electronically Signed   By: Worthy Heads M.D.   On: 08/16/2023 03:31    ROS - all of the below systems have been reviewed with the patient and positives are indicated with bold text General: chills, fever or night sweats Eyes: blurry vision or double vision ENT: epistaxis or sore throat Hematologic/Lymphatic: bleeding problems, blood clots or swollen lymph nodes Endocrine: temperature intolerance or unexpected weight changes Breast: new or changing breast lumps or nipple discharge Resp: cough, shortness of breath, or wheezing CV: chest pain or dyspnea on  exertion GI: as per HPI GU: dysuria, trouble voiding, or hematuria Neuro: TIA or stroke symptoms    PE Blood pressure (!) 159/81, pulse 99, temperature 98.8 F (37.1 C), resp. rate 18, height 5\' 2"  (1.575 m), weight 86.2 kg, SpO2 95%. Constitutional: NAD; conversant; no deformities Eyes: Moist conjunctiva; no lid lag; anicteric; PERRL Neck: Trachea midline; no thyromegaly Lungs: Normal respiratory effort CV: RRR GI: Abd soft,  TTP right of midepigastrum MSK: Normal range of motion of extremities; no clubbing/cyanosis, intention tremor noted Psychiatric: Appropriate affect; alert and oriented x3  Results for orders placed or performed during the hospital encounter of 08/16/23  (from the past 48 hours)  CBC     Status: None   Collection Time: 08/16/23  3:16 AM  Result Value Ref Range   WBC 9.1 4.0 - 10.5 K/uL   RBC 4.64 3.87 - 5.11 MIL/uL   Hemoglobin 13.6 12.0 - 15.0 g/dL   HCT 98.1 19.1 - 47.8 %   MCV 90.3 80.0 - 100.0 fL   MCH 29.3 26.0 - 34.0 pg   MCHC 32.5 30.0 - 36.0 g/dL   RDW 29.5 62.1 - 30.8 %   Platelets 289 150 - 400 K/uL   nRBC 0.0 0.0 - 0.2 %    Comment: Performed at [facility], [address] [place], [place] [address]  Troponin T, High Sensitivity     Status: None   Collection Time: 08/16/23  3:16 AM  Result Value Ref Range   Troponin T High Sensitivity <15 <19 ng/L    Comment: (NOTE) Biotin concentrations > 1000 ng/mL falsely decrease TnT results.  Serial cardiac troponin measurements are suggested.  Refer to the Links section for chest pain algorithms and additional  guidance. Performed at [facility], [address], [place], [place] [address]   Comprehensive metabolic panel     Status: Abnormal   Collection Time: 08/16/23  3:19 AM  Result Value Ref Range   Sodium 140 135 - 145 mmol/L   Potassium 3.3 (L) 3.5 - 5.1 mmol/L   Chloride 103 98 - 111 mmol/L   CO2 22 22 - 32 mmol/L   Glucose, Bld 127 (H) 70 - 99 mg/dL    Comment: Glucose reference range applies only to samples taken after fasting for at least 8 hours.   BUN 15 8 - 23 mg/dL   Creatinine, Ser 5.28 0.44 - 1.00 mg/dL   Calcium  9.4 8.9 - 10.3 mg/dL   Total Protein 7.5 6.5 - 8.1 g/dL   Albumin 4.5 3.5 - 5.0 g/dL   AST 27 15 - 41 U/L   ALT 44 0 - 44 U/L   Alkaline Phosphatase 76 38 - 126 U/L   Total Bilirubin 0.5 0.0 - 1.2 mg/dL   GFR, Estimated >41 >32 mL/min    Comment: (NOTE) Calculated using the CKD-EPI Creatinine Equation (2021)    Anion gap 14 5 - 15    Comment: Performed at [facility], [address] [place], [place] [address]  Lipase, blood     Status: None   Collection Time: 08/16/23  3:19 AM   Result Value Ref Range   Lipase 46 11 - 51 U/L    Comment: Performed at [facility], [address] [place], [place] [address]  Troponin T, High Sensitivity     Status: None   Collection Time: 08/16/23  5:30 AM  Result Value Ref Range   Troponin T High Sensitivity <15 <19 ng/L  Comment: (NOTE) Biotin concentrations > 1000 ng/mL falsely decrease TnT results.  Serial cardiac troponin measurements are suggested.  Refer to the Links section for chest pain algorithms and additional  guidance. Performed at [facility], [address] [place], [place] [address]   Comprehensive metabolic panel     Status: Abnormal   Collection Time: 08/17/23  4:03 AM  Result Value Ref Range   Sodium 139 135 - 145 mmol/L   Potassium 3.6 3.5 - 5.1 mmol/L   Chloride 106 98 - 111 mmol/L   CO2 26 22 - 32 mmol/L   Glucose, Bld 102 (H) 70 - 99 mg/dL    Comment: Glucose reference range applies only to samples taken after fasting for at least 8 hours.   BUN 9 8 - 23 mg/dL   Creatinine, Ser 6.57 0.44 - 1.00 mg/dL   Calcium  9.0 8.9 - 10.3 mg/dL   Total Protein 6.3 (L) 6.5 - 8.1 g/dL   Albumin 3.6 3.5 - 5.0 g/dL   AST 44 (H) 15 - 41 U/L   ALT 62 (H) 0 - 44 U/L   Alkaline Phosphatase 63 38 - 126 U/L   Total Bilirubin 1.5 (H) 0.0 - 1.2 mg/dL   GFR, Estimated >84 >69 mL/min    Comment: (NOTE) Calculated using the CKD-EPI Creatinine Equation (2021)    Anion gap 7 5 - 15    Comment: Performed at [facility], [address], [place], [place] [address]  CBC     Status: None   Collection Time: 08/17/23  4:03 AM  Result Value Ref Range   WBC 8.9 4.0 - 10.5 K/uL   RBC 4.27 3.87 - 5.11 MIL/uL   Hemoglobin 12.8 12.0 - 15.0 g/dL   HCT 84.1 32.4 - 40.1 %   MCV 91.3 80.0 - 100.0 fL   MCH 30.0 26.0 - 34.0 pg   MCHC 32.8 30.0 - 36.0 g/dL   RDW 02.7 25.3 - 66.4 %   Platelets 236 150 - 400 K/uL   nRBC 0.0 0.0 - 0.2 %    Comment: Performed at [facility], [address], [place], [place] [address]    CT ABDOMEN PELVIS W CONTRAST Result Date: 08/16/2023 CLINICAL DATA:  Abdominal pain acute nonlocalized EXAM: CT ABDOMEN AND PELVIS WITH CONTRAST TECHNIQUE: Multidetector CT imaging of the abdomen and pelvis was performed using the standard protocol following bolus administration of intravenous contrast. RADIATION DOSE REDUCTION: This exam was performed according to the departmental dose-optimization program which includes automated exposure control, adjustment of the mA and/or kV according to patient size and/or use of iterative reconstruction technique. CONTRAST:  OMNIPAQUE IOHEXOL 300 MG/ML  SOLN COMPARISON:  Ultrasound August 16, 2023 FINDINGS: Lower chest: No acute abnormality. Hepatobiliary: Gallstones without gallbladder inflammatory changes correlate with chronic cholelithiasis and correlate with prior ultrasound Bilobed cyst in the anterolateral margin of the right lobe of the liver correlates with prior ultrasound measuring 4 x 3.9 cm of no clinical significance. 6 mm cyst in the left lobe of the liver of no clinical significance. No other hepatic lesions or biliary dilatation. Pancreas: Unremarkable. No pancreatic ductal dilatation or surrounding inflammatory changes. Spleen: Adrenals/Urinary Tract: Adrenal glands are unremarkable. Kidneys are normal, without renal calculi, focal lesion, or hydronephrosis. Bladder is unremarkable. Stomach/Bowel: Stomach is within normal limits. Appendix appears normal. No evidence of bowel wall thickening, distention, or inflammatory changes. No evidence of diverticulitis or appendicitis. Vascular/Lymphatic: No significant vascular findings are present. No enlarged  abdominal or pelvic lymph nodes. Reproductive: Uterus normal size with a 2 cm calcification in the left lower lateral uterine segment consistent with calcified fibroid. No other adnexal masses. Bladder normal. Other: No abdominal wall  hernia or abnormality. No abdominopelvic ascites. Small anterior abdominal wall umbilical hernia containing fat Musculoskeletal: No acute or significant osseous findings. Degenerative disc disease L4-L5. With a diffuse osteopenia IMPRESSION: *No acute findings in the abdomen or pelvis. *Cholelithiasis without gallbladder inflammatory changes correlate with prior ultrasound. *Small anterior abdominal wall umbilical hernia containing fat. *Calcified uterine fibroid. *Degenerative disc disease L4-L5. *Bilobed cyst in the anterolateral margin of the right lobe of the liver correlates with prior ultrasound measuring 4 x 3.9 cm of no clinical significance. 6 mm cyst in the left lobe of the liver of no clinical significance. Electronically Signed   By: Fredrich Jefferson M.D.   On: 08/16/2023 08:36   US  Abdomen Limited RUQ (LIVER/GB) Result Date: 08/16/2023 CLINICAL DATA:  Epigastric pain. EXAM: ULTRASOUND ABDOMEN LIMITED RIGHT UPPER QUADRANT COMPARISON:  None Available. FINDINGS: Gallbladder: Gallstones identified which measure up to 0.9 cm. No gallbladder wall thickening, pericholecystic fluid or sonographic Murphy's sign. Common bile duct: Diameter: 6.1 mm.  No intrahepatic bile duct dilatation. Liver: Normal parenchymal echogenicity. There are bilobar liver cysts. The largest is in the inferior right lobe measuring 4.6 x 4.4 x 4.6 cm. This dominant cyst contains several internal areas septation, unchanged from 08/02/2022. Smaller cysts have more simple appearance. Portal vein is patent on color Doppler imaging with normal direction of blood flow towards the liver. Other: None. IMPRESSION: 1. Cholelithiasis without secondary signs of acute cholecystitis. 2. Common bile duct is upper limits of normal measuring 6 mm. 3. Bilobar liver cysts. The largest is in the inferior right lobe measuring 4.6 x 4.4 x 4.6 cm. This dominant cyst contains several internal areas septation, unchanged from 08/02/2022. Electronically Signed    By: Kimberley Penman M.D.   On: 08/16/2023 05:49   DG Chest Port 1 View Result Date: 08/16/2023 CLINICAL DATA:  Chest pain EXAM: PORTABLE CHEST 1 VIEW COMPARISON:  05/19/2022 FINDINGS: The heart size and mediastinal contours are within normal limits. Both lungs are clear. The visualized skeletal structures are unremarkable. IMPRESSION: No active disease. Electronically Signed   By: Worthy Heads M.D.   On: 08/16/2023 03:31     A/P: Barbara [name] is an 73 y.o. female with abd pain that appears to be consistent with gallbladder pathology.  Her bilirubin is now slightly elevated.  I have recommended laparoscopic cholecystectomy.  NPO today.  We will see if there is availability in the OR later today.   Fernande Howells, MD  Colorectal and General Surgery [facility]   moderate decision making

## 2023-08-17 NOTE — H&P (View-Only) (Signed)
 CC: abd pain  Requesting provider: Dr Alfonse Angle   HPI: Barbara [name] is an 73 y.o. female who is here for abd pain.  She states that she woke up early Wed AM with epigastric pain that radiated around her abdomen and through to her back.  It was associated with nausea.  She was admitted to the hospital from the ED with normal workup and RUQ US  showing gallstones only.  EGD was completed yesterday to rule out GI pathology.  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, nickel  03/31/2017   Anxiety    Arthritis    knees   Cancer (HCC)    basal cell- nose   GERD (gastroesophageal reflux disease)    Hyperlipidemia    Hypertension    Meningioma (HCC)    Neuromuscular disorder (HCC)    benign tremor- takes Propanolol   PONV (postoperative nausea and vomiting)    "BP DROPS ALSO"   Trigeminal neuralgia of right side of face     Past Surgical History:  Procedure Laterality Date   91 HOUR PH STUDY N/A 04/17/2016   Procedure: 24 HOUR PH STUDY;  Surgeon: Danette Duos, MD;  Location: [facility];  Service: Gastroenterology;  Laterality: N/A;   ESOPHAGEAL MANOMETRY N/A 04/17/2016   Procedure: ESOPHAGEAL MANOMETRY (EM);  Surgeon: Danette Duos, MD;  Location: [facility];  Service: Gastroenterology;  Laterality: N/A;   EXTERNAL FIXATION LEG Left 03/28/2017   [place]   EXTERNAL FIXATION LEG Left 03/28/2017   Procedure: EXTERNAL FIXATION LEG;  Surgeon: Saundra Curl, MD;  Location: [facility];  Service: Orthopedics;  Laterality: Left;   EXTERNAL FIXATION REMOVAL Left 05/08/2017   Procedure: REMOVAL EXTERNAL FIXATION LEFT  LEG WITH HARDWARE REMOVAL;  Surgeon: Saundra Curl, MD;  Location: [facility];  Service: Orthopedics;  Laterality: Left;   fractured femur  05/2012   left - rod -screws placed   HARDWARE REMOVAL Left 04/16/2013   Procedure: REMOVAL OF HARDWARE OF LEFT KNEE (DISTAL INTERLOC SCREW);  Surgeon: Aurther Blue, MD;  Location: [facility];   Service: Orthopedics;  Laterality: Left;   KNEE SURGERY     x 3 left / x1 rt knee   LEFT HEART CATH AND CORONARY ANGIOGRAPHY N/A 09/18/2016   Procedure: Left Heart Cath and Coronary Angiography;  Surgeon: Arnoldo Lapping, MD;  Location: [facility];  Service: Cardiovascular;  Laterality: N/A;   NASAL SINUS SURGERY     2000   OTHER SURGICAL HISTORY  2014   titanium rod in left femur used for fracture repair   SHOULDER SURGERY     bilateral shoulders    WISDOM TOOTH EXTRACTION      Family History  Problem Relation Age of Onset   Stomach cancer Mother    Heart disease Mother 96       First MI early 58s, CABG    Lung cancer Father        smoked   Allergies Father    Heart attack Father 70   Brain cancer Brother    Colon cancer Neg Hx    Esophageal cancer Neg Hx    Rectal cancer Neg Hx     Social:  reports that she has never smoked. She has never used smokeless tobacco. She reports current alcohol use. She reports that she does not use drugs.  Allergies:  Allergies  Allergen Reactions   Ace Inhibitors Hives and Nausea Only  Nasal congestion   Azathioprine Other (See Comments)    "cardiac issues"   Celebrex [Celecoxib] Other (See Comments)    Severe stomach cramps   Crestor  [Rosuvastatin ] Other (See Comments)    Bilat leg/heel pains/swelling   Fish Oil Other (See Comments)    Broke out with blisters   Forteo [Parathyroid Hormone (Recomb)]     Unknown   Gabapentin      Pain and swelling    Lanolin Acid [Lanolin]     rash   Levofloxacin Other (See Comments)    "sore ankles"   Maxzide [Triamterene -Hctz] Other (See Comments)    Knee, ankles and back pain   Methadone Nausea And Vomiting    All narcotic pain killers   Methotrexate Derivatives Other (See Comments)    "cardiac issues"   Nickel     rash   Penicillins Hives    All penicillins   Prednisone  Other (See Comments)    High blood pressure.   Propoxyphene Hcl Nausea Only   Sulfonamide Derivatives Nausea  Only and Other (See Comments)    Stomach cramping   Synvisc [Hylan G-F 20]     Made knee swell up at injection site   Tramadol  Nausea And Vomiting    Medications: I have reviewed the patient's current medications.  Results for orders placed or performed during the hospital encounter of 08/16/23 (from the past 48 hours)  CBC     Status: None   Collection Time: 08/16/23  3:16 AM  Result Value Ref Range   WBC 9.1 4.0 - 10.5 K/uL   RBC 4.64 3.87 - 5.11 MIL/uL   Hemoglobin 13.6 12.0 - 15.0 g/dL   HCT 86.5 78.4 - 69.6 %   MCV 90.3 80.0 - 100.0 fL   MCH 29.3 26.0 - 34.0 pg   MCHC 32.5 30.0 - 36.0 g/dL   RDW 29.5 28.4 - 13.2 %   Platelets 289 150 - 400 K/uL   nRBC 0.0 0.0 - 0.2 %    Comment: Performed at [facility], [address] [place], [place] [address]  Troponin T, High Sensitivity     Status: None   Collection Time: 08/16/23  3:16 AM  Result Value Ref Range   Troponin T High Sensitivity <15 <19 ng/L    Comment: (NOTE) Biotin concentrations > 1000 ng/mL falsely decrease TnT results.  Serial cardiac troponin measurements are suggested.  Refer to the Links section for chest pain algorithms and additional  guidance. Performed at [facility], [address] [place], [place] [address]   Comprehensive metabolic panel     Status: Abnormal   Collection Time: 08/16/23  3:19 AM  Result Value Ref Range   Sodium 140 135 - 145 mmol/L   Potassium 3.3 (L) 3.5 - 5.1 mmol/L   Chloride 103 98 - 111 mmol/L   CO2 22 22 - 32 mmol/L   Glucose, Bld 127 (H) 70 - 99 mg/dL    Comment: Glucose reference range applies only to samples taken after fasting for at least 8 hours.   BUN 15 8 - 23 mg/dL   Creatinine, Ser 6.64 0.44 - 1.00 mg/dL   Calcium  9.4 8.9 - 10.3 mg/dL   Total Protein 7.5 6.5 - 8.1 g/dL   Albumin 4.5 3.5 - 5.0 g/dL   AST 27 15 - 41 U/L   ALT 44 0 - 44 U/L   Alkaline Phosphatase 76 38 - 126 U/L   Total Bilirubin 0.5 0.0 - 1.2 mg/dL  GFR, Estimated >60 >60 mL/min    Comment: (NOTE) Calculated using the CKD-EPI Creatinine Equation (2021)    Anion gap 14 5 - 15    Comment: Performed at [facility], [address] [place], [place] [address]  Lipase, blood     Status: None   Collection Time: 08/16/23  3:19 AM  Result Value Ref Range   Lipase 46 11 - 51 U/L    Comment: Performed at [facility], [address] [place], [place] [address]  Troponin T, High Sensitivity     Status: None   Collection Time: 08/16/23  5:30 AM  Result Value Ref Range   Troponin T High Sensitivity <15 <19 ng/L    Comment: (NOTE) Biotin concentrations > 1000 ng/mL falsely decrease TnT results.  Serial cardiac troponin measurements are suggested.  Refer to the Links section for chest pain algorithms and additional  guidance. Performed at [facility], [address] [place], [place] [address]   Comprehensive metabolic panel     Status: Abnormal   Collection Time: 08/17/23  4:03 AM  Result Value Ref Range   Sodium 139 135 - 145 mmol/L   Potassium 3.6 3.5 - 5.1 mmol/L   Chloride 106 98 - 111 mmol/L   CO2 26 22 - 32 mmol/L   Glucose, Bld 102 (H) 70 - 99 mg/dL    Comment: Glucose reference range applies only to samples taken after fasting for at least 8 hours.   BUN 9 8 - 23 mg/dL   Creatinine, Ser 8.65 0.44 - 1.00 mg/dL   Calcium  9.0 8.9 - 10.3 mg/dL   Total Protein 6.3 (L) 6.5 - 8.1 g/dL   Albumin 3.6 3.5 - 5.0 g/dL   AST 44 (H) 15 - 41 U/L   ALT 62 (H) 0 - 44 U/L   Alkaline Phosphatase 63 38 - 126 U/L   Total Bilirubin 1.5 (H) 0.0 - 1.2 mg/dL   GFR, Estimated >78 >46 mL/min    Comment: (NOTE) Calculated using the CKD-EPI Creatinine Equation (2021)    Anion gap 7 5 - 15    Comment: Performed at [facility], [address] [place], [place] [address]  CBC     Status: None   Collection Time: 08/17/23  4:03 AM  Result Value Ref Range   WBC 8.9 4.0 -  10.5 K/uL   RBC 4.27 3.87 - 5.11 MIL/uL   Hemoglobin 12.8 12.0 - 15.0 g/dL   HCT 28.4 13.2 - 44.0 %   MCV 91.3 80.0 - 100.0 fL   MCH 30.0 26.0 - 34.0 pg   MCHC 32.8 30.0 - 36.0 g/dL   RDW 10.2 72.5 - 36.6 %   Platelets 236 150 - 400 K/uL   nRBC 0.0 0.0 - 0.2 %    Comment: Performed at [facility], [address] [place], [place] [address]    CT ABDOMEN PELVIS W CONTRAST Result Date: 08/16/2023 CLINICAL DATA:  Abdominal pain acute nonlocalized EXAM: CT ABDOMEN AND PELVIS WITH CONTRAST TECHNIQUE: Multidetector CT imaging of the abdomen and pelvis was performed using the standard protocol following bolus administration of intravenous contrast. RADIATION DOSE REDUCTION: This exam was performed according to the departmental dose-optimization program which includes automated exposure control, adjustment of the mA and/or kV according to patient size and/or use of iterative reconstruction technique. CONTRAST:  OMNIPAQUE IOHEXOL 300 MG/ML  SOLN COMPARISON:  Ultrasound August 16, 2023 FINDINGS: Lower chest: No acute abnormality. Hepatobiliary: Gallstones  without gallbladder inflammatory changes correlate with chronic cholelithiasis and correlate with prior ultrasound Bilobed cyst in the anterolateral margin of the right lobe of the liver correlates with prior ultrasound measuring 4 x 3.9 cm of no clinical significance. 6 mm cyst in the left lobe of the liver of no clinical significance. No other hepatic lesions or biliary dilatation. Pancreas: Unremarkable. No pancreatic ductal dilatation or surrounding inflammatory changes. Spleen: Adrenals/Urinary Tract: Adrenal glands are unremarkable. Kidneys are normal, without renal calculi, focal lesion, or hydronephrosis. Bladder is unremarkable. Stomach/Bowel: Stomach is within normal limits. Appendix appears normal. No evidence of bowel wall thickening, distention, or inflammatory changes. No evidence of diverticulitis or appendicitis.  Vascular/Lymphatic: No significant vascular findings are present. No enlarged abdominal or pelvic lymph nodes. Reproductive: Uterus normal size with a 2 cm calcification in the left lower lateral uterine segment consistent with calcified fibroid. No other adnexal masses. Bladder normal. Other: No abdominal wall hernia or abnormality. No abdominopelvic ascites. Small anterior abdominal wall umbilical hernia containing fat Musculoskeletal: No acute or significant osseous findings. Degenerative disc disease L4-L5. With a diffuse osteopenia IMPRESSION: *No acute findings in the abdomen or pelvis. *Cholelithiasis without gallbladder inflammatory changes correlate with prior ultrasound. *Small anterior abdominal wall umbilical hernia containing fat. *Calcified uterine fibroid. *Degenerative disc disease L4-L5. *Bilobed cyst in the anterolateral margin of the right lobe of the liver correlates with prior ultrasound measuring 4 x 3.9 cm of no clinical significance. 6 mm cyst in the left lobe of the liver of no clinical significance. Electronically Signed   By: Fredrich Jefferson M.D.   On: 08/16/2023 08:36   US  Abdomen Limited RUQ (LIVER/GB) Result Date: 08/16/2023 CLINICAL DATA:  Epigastric pain. EXAM: ULTRASOUND ABDOMEN LIMITED RIGHT UPPER QUADRANT COMPARISON:  None Available. FINDINGS: Gallbladder: Gallstones identified which measure up to 0.9 cm. No gallbladder wall thickening, pericholecystic fluid or sonographic Murphy's sign. Common bile duct: Diameter: 6.1 mm.  No intrahepatic bile duct dilatation. Liver: Normal parenchymal echogenicity. There are bilobar liver cysts. The largest is in the inferior right lobe measuring 4.6 x 4.4 x 4.6 cm. This dominant cyst contains several internal areas septation, unchanged from 08/02/2022. Smaller cysts have more simple appearance. Portal vein is patent on color Doppler imaging with normal direction of blood flow towards the liver. Other: None. IMPRESSION: 1. Cholelithiasis without  secondary signs of acute cholecystitis. 2. Common bile duct is upper limits of normal measuring 6 mm. 3. Bilobar liver cysts. The largest is in the inferior right lobe measuring 4.6 x 4.4 x 4.6 cm. This dominant cyst contains several internal areas septation, unchanged from 08/02/2022. Electronically Signed   By: Kimberley Penman M.D.   On: 08/16/2023 05:49   DG Chest Port 1 View Result Date: 08/16/2023 CLINICAL DATA:  Chest pain EXAM: PORTABLE CHEST 1 VIEW COMPARISON:  05/19/2022 FINDINGS: The heart size and mediastinal contours are within normal limits. Both lungs are clear. The visualized skeletal structures are unremarkable. IMPRESSION: No active disease. Electronically Signed   By: Worthy Heads M.D.   On: 08/16/2023 03:31    ROS - all of the below systems have been reviewed with the patient and positives are indicated with bold text General: chills, fever or night sweats Eyes: blurry vision or double vision ENT: epistaxis or sore throat Hematologic/Lymphatic: bleeding problems, blood clots or swollen lymph nodes Endocrine: temperature intolerance or unexpected weight changes Breast: new or changing breast lumps or nipple discharge Resp: cough, shortness of breath, or wheezing CV: chest pain or dyspnea on  exertion GI: as per HPI GU: dysuria, trouble voiding, or hematuria Neuro: TIA or stroke symptoms    PE Blood pressure (!) 159/81, pulse 99, temperature 98.8 F (37.1 C), resp. rate 18, height 5\' 2"  (1.575 m), weight 86.2 kg, SpO2 95%. Constitutional: NAD; conversant; no deformities Eyes: Moist conjunctiva; no lid lag; anicteric; PERRL Neck: Trachea midline; no thyromegaly Lungs: Normal respiratory effort CV: RRR GI: Abd soft,  TTP right of midepigastrum MSK: Normal range of motion of extremities; no clubbing/cyanosis, intention tremor noted Psychiatric: Appropriate affect; alert and oriented x3  Results for orders placed or performed during the hospital encounter of 08/16/23  (from the past 48 hours)  CBC     Status: None   Collection Time: 08/16/23  3:16 AM  Result Value Ref Range   WBC 9.1 4.0 - 10.5 K/uL   RBC 4.64 3.87 - 5.11 MIL/uL   Hemoglobin 13.6 12.0 - 15.0 g/dL   HCT 98.1 19.1 - 47.8 %   MCV 90.3 80.0 - 100.0 fL   MCH 29.3 26.0 - 34.0 pg   MCHC 32.5 30.0 - 36.0 g/dL   RDW 29.5 62.1 - 30.8 %   Platelets 289 150 - 400 K/uL   nRBC 0.0 0.0 - 0.2 %    Comment: Performed at [facility], [address] [place], [place] [address]  Troponin T, High Sensitivity     Status: None   Collection Time: 08/16/23  3:16 AM  Result Value Ref Range   Troponin T High Sensitivity <15 <19 ng/L    Comment: (NOTE) Biotin concentrations > 1000 ng/mL falsely decrease TnT results.  Serial cardiac troponin measurements are suggested.  Refer to the Links section for chest pain algorithms and additional  guidance. Performed at [facility], [address], [place], [place] [address]   Comprehensive metabolic panel     Status: Abnormal   Collection Time: 08/16/23  3:19 AM  Result Value Ref Range   Sodium 140 135 - 145 mmol/L   Potassium 3.3 (L) 3.5 - 5.1 mmol/L   Chloride 103 98 - 111 mmol/L   CO2 22 22 - 32 mmol/L   Glucose, Bld 127 (H) 70 - 99 mg/dL    Comment: Glucose reference range applies only to samples taken after fasting for at least 8 hours.   BUN 15 8 - 23 mg/dL   Creatinine, Ser 5.28 0.44 - 1.00 mg/dL   Calcium  9.4 8.9 - 10.3 mg/dL   Total Protein 7.5 6.5 - 8.1 g/dL   Albumin 4.5 3.5 - 5.0 g/dL   AST 27 15 - 41 U/L   ALT 44 0 - 44 U/L   Alkaline Phosphatase 76 38 - 126 U/L   Total Bilirubin 0.5 0.0 - 1.2 mg/dL   GFR, Estimated >41 >32 mL/min    Comment: (NOTE) Calculated using the CKD-EPI Creatinine Equation (2021)    Anion gap 14 5 - 15    Comment: Performed at [facility], [address] [place], [place] [address]  Lipase, blood     Status: None   Collection Time: 08/16/23  3:19 AM   Result Value Ref Range   Lipase 46 11 - 51 U/L    Comment: Performed at [facility], [address] [place], [place] [address]  Troponin T, High Sensitivity     Status: None   Collection Time: 08/16/23  5:30 AM  Result Value Ref Range   Troponin T High Sensitivity <15 <19 ng/L  Comment: (NOTE) Biotin concentrations > 1000 ng/mL falsely decrease TnT results.  Serial cardiac troponin measurements are suggested.  Refer to the Links section for chest pain algorithms and additional  guidance. Performed at [facility], [address] [place], [place] [address]   Comprehensive metabolic panel     Status: Abnormal   Collection Time: 08/17/23  4:03 AM  Result Value Ref Range   Sodium 139 135 - 145 mmol/L   Potassium 3.6 3.5 - 5.1 mmol/L   Chloride 106 98 - 111 mmol/L   CO2 26 22 - 32 mmol/L   Glucose, Bld 102 (H) 70 - 99 mg/dL    Comment: Glucose reference range applies only to samples taken after fasting for at least 8 hours.   BUN 9 8 - 23 mg/dL   Creatinine, Ser 6.57 0.44 - 1.00 mg/dL   Calcium  9.0 8.9 - 10.3 mg/dL   Total Protein 6.3 (L) 6.5 - 8.1 g/dL   Albumin 3.6 3.5 - 5.0 g/dL   AST 44 (H) 15 - 41 U/L   ALT 62 (H) 0 - 44 U/L   Alkaline Phosphatase 63 38 - 126 U/L   Total Bilirubin 1.5 (H) 0.0 - 1.2 mg/dL   GFR, Estimated >84 >69 mL/min    Comment: (NOTE) Calculated using the CKD-EPI Creatinine Equation (2021)    Anion gap 7 5 - 15    Comment: Performed at [facility], [address], [place], [place] [address]  CBC     Status: None   Collection Time: 08/17/23  4:03 AM  Result Value Ref Range   WBC 8.9 4.0 - 10.5 K/uL   RBC 4.27 3.87 - 5.11 MIL/uL   Hemoglobin 12.8 12.0 - 15.0 g/dL   HCT 84.1 32.4 - 40.1 %   MCV 91.3 80.0 - 100.0 fL   MCH 30.0 26.0 - 34.0 pg   MCHC 32.8 30.0 - 36.0 g/dL   RDW 02.7 25.3 - 66.4 %   Platelets 236 150 - 400 K/uL   nRBC 0.0 0.0 - 0.2 %    Comment: Performed at [facility], [address], [place], [place] [address]    CT ABDOMEN PELVIS W CONTRAST Result Date: 08/16/2023 CLINICAL DATA:  Abdominal pain acute nonlocalized EXAM: CT ABDOMEN AND PELVIS WITH CONTRAST TECHNIQUE: Multidetector CT imaging of the abdomen and pelvis was performed using the standard protocol following bolus administration of intravenous contrast. RADIATION DOSE REDUCTION: This exam was performed according to the departmental dose-optimization program which includes automated exposure control, adjustment of the mA and/or kV according to patient size and/or use of iterative reconstruction technique. CONTRAST:  OMNIPAQUE IOHEXOL 300 MG/ML  SOLN COMPARISON:  Ultrasound August 16, 2023 FINDINGS: Lower chest: No acute abnormality. Hepatobiliary: Gallstones without gallbladder inflammatory changes correlate with chronic cholelithiasis and correlate with prior ultrasound Bilobed cyst in the anterolateral margin of the right lobe of the liver correlates with prior ultrasound measuring 4 x 3.9 cm of no clinical significance. 6 mm cyst in the left lobe of the liver of no clinical significance. No other hepatic lesions or biliary dilatation. Pancreas: Unremarkable. No pancreatic ductal dilatation or surrounding inflammatory changes. Spleen: Adrenals/Urinary Tract: Adrenal glands are unremarkable. Kidneys are normal, without renal calculi, focal lesion, or hydronephrosis. Bladder is unremarkable. Stomach/Bowel: Stomach is within normal limits. Appendix appears normal. No evidence of bowel wall thickening, distention, or inflammatory changes. No evidence of diverticulitis or appendicitis. Vascular/Lymphatic: No significant vascular findings are present. No enlarged  abdominal or pelvic lymph nodes. Reproductive: Uterus normal size with a 2 cm calcification in the left lower lateral uterine segment consistent with calcified fibroid. No other adnexal masses. Bladder normal. Other: No abdominal wall  hernia or abnormality. No abdominopelvic ascites. Small anterior abdominal wall umbilical hernia containing fat Musculoskeletal: No acute or significant osseous findings. Degenerative disc disease L4-L5. With a diffuse osteopenia IMPRESSION: *No acute findings in the abdomen or pelvis. *Cholelithiasis without gallbladder inflammatory changes correlate with prior ultrasound. *Small anterior abdominal wall umbilical hernia containing fat. *Calcified uterine fibroid. *Degenerative disc disease L4-L5. *Bilobed cyst in the anterolateral margin of the right lobe of the liver correlates with prior ultrasound measuring 4 x 3.9 cm of no clinical significance. 6 mm cyst in the left lobe of the liver of no clinical significance. Electronically Signed   By: Fredrich Jefferson M.D.   On: 08/16/2023 08:36   US  Abdomen Limited RUQ (LIVER/GB) Result Date: 08/16/2023 CLINICAL DATA:  Epigastric pain. EXAM: ULTRASOUND ABDOMEN LIMITED RIGHT UPPER QUADRANT COMPARISON:  None Available. FINDINGS: Gallbladder: Gallstones identified which measure up to 0.9 cm. No gallbladder wall thickening, pericholecystic fluid or sonographic Murphy's sign. Common bile duct: Diameter: 6.1 mm.  No intrahepatic bile duct dilatation. Liver: Normal parenchymal echogenicity. There are bilobar liver cysts. The largest is in the inferior right lobe measuring 4.6 x 4.4 x 4.6 cm. This dominant cyst contains several internal areas septation, unchanged from 08/02/2022. Smaller cysts have more simple appearance. Portal vein is patent on color Doppler imaging with normal direction of blood flow towards the liver. Other: None. IMPRESSION: 1. Cholelithiasis without secondary signs of acute cholecystitis. 2. Common bile duct is upper limits of normal measuring 6 mm. 3. Bilobar liver cysts. The largest is in the inferior right lobe measuring 4.6 x 4.4 x 4.6 cm. This dominant cyst contains several internal areas septation, unchanged from 08/02/2022. Electronically Signed    By: Kimberley Penman M.D.   On: 08/16/2023 05:49   DG Chest Port 1 View Result Date: 08/16/2023 CLINICAL DATA:  Chest pain EXAM: PORTABLE CHEST 1 VIEW COMPARISON:  05/19/2022 FINDINGS: The heart size and mediastinal contours are within normal limits. Both lungs are clear. The visualized skeletal structures are unremarkable. IMPRESSION: No active disease. Electronically Signed   By: Worthy Heads M.D.   On: 08/16/2023 03:31     A/P: Barbara [name] is an 73 y.o. female with abd pain that appears to be consistent with gallbladder pathology.  Her bilirubin is now slightly elevated.  I have recommended laparoscopic cholecystectomy.  NPO today.  We will see if there is availability in the OR later today.   Fernande Howells, MD  Colorectal and General Surgery [facility]   moderate decision making

## 2023-08-17 NOTE — Care Management Obs Status (Signed)
 MEDICARE OBSERVATION STATUS NOTIFICATION   Patient Details  Name: GAELYN TUKES MRN: [id number] Date of Birth: 23-Jul-1950   Medicare Observation Status Notification Given:  Yes    Kathryn Parish, RN 08/17/2023, 8:17 AM

## 2023-08-17 NOTE — Progress Notes (Signed)
 [facility]  PROGRESS NOTE  Barbara [name] GNF:[id number] DOB: [birth year] DOA: 08/16/2023 PCP: Olin Bertin, MD   Brief HPI:    73 y.o. female with medical history significant for hypertension, hyperlipidemia, GERD, chronic neuropathic pain being admitted to the hospital with intractable epigastric abdominal pain.     Assessment/Plan:   Abdominal pain -RUQ US  showing gallstones only. EGD was completed yesterday to rule out GI pathology.  bilirubin is now slightly elevated.  General Surgery consulted; plan for laparoscopic cholecystectomy.    Hypertension - Start hydralazine 5 mg IV every 6 hours as needed    Medications     pantoprazole  (PROTONIX ) IV  40 mg Intravenous Q24H     Data Reviewed:   CBG:  No results for input(s): "GLUCAP" in the last 168 hours.  SpO2: 95 % O2 Flow Rate (L/min): 5 L/min    Vitals:   08/16/23 1420 08/16/23 1511 08/16/23 1916 08/17/23 0402  BP: (!) 136/49 128/87 (!) 140/82 (!) 159/81  Pulse: 79 80 97 99  Resp: 12  18 18   Temp:  97.9 F (36.6 C) 98.5 F (36.9 C) 98.8 F (37.1 C)  TempSrc:      SpO2: 94% 95% 95% 95%  Weight:      Height:          Data Reviewed:  Basic Metabolic Panel: Recent Labs  Lab 08/16/23 0319 08/17/23 0403  NA 140 139  K 3.3* 3.6  CL 103 106  CO2 22 26  GLUCOSE 127* 102*  BUN 15 9  CREATININE 0.89 0.62  CALCIUM  9.4 9.0    CBC: Recent Labs  Lab 08/16/23 0316 08/17/23 0403  WBC 9.1 8.9  HGB 13.6 12.8  HCT 41.9 39.0  MCV 90.3 91.3  PLT 289 236    LFT Recent Labs  Lab 08/16/23 0319 08/17/23 0403  AST 27 44*  ALT 44 62*  ALKPHOS 76 63  BILITOT 0.5 1.5*  PROT 7.5 6.3*  ALBUMIN 4.5 3.6     Antibiotics: Anti-infectives (From admission, onward)    None        DVT prophylaxis: SCDs  Code Status: Full code  Family Communication: No family at bedside   CONSULTS    Subjective   Denies nausea vomiting abdominal pain   Objective    Physical  Examination:   General-appears in no acute distress Heart-S1-S2, regular, no murmur auscultated Lungs-clear to auscultation bilaterally, no wheezing or crackles auscultated Abdomen-soft, mild Eppy gastric tenderness to palpation Extremities-no edema in the lower extremities Neuro-alert, oriented x3, no focal deficit noted  Status is: Inpatient:             [name]   [facility] If 7PM-7AM, please contact night-coverage at [email or web address], Office  [phone]   08/17/2023, 7:52 AM  LOS: 0 days

## 2023-08-17 NOTE — Progress Notes (Signed)
 Mobility Specialist - Progress Note   08/17/23 0946  Mobility  Activity Ambulated with assistance in hallway  Level of Assistance Modified independent, requires aide device or extra time  Assistive Device Front wheel walker  Distance Ambulated (ft) 350 ft  Activity Response Tolerated well  Mobility Referral Yes  Mobility visit 1 Mobility  Mobility Specialist Start Time (ACUTE ONLY) [id number]  Mobility Specialist Stop Time (ACUTE ONLY) 0946  Mobility Specialist Time Calculation (min) (ACUTE ONLY) 9 min   Pt received in bed and agreeable to mobility. No complaints during session. Pt to bathroom after session with all needs met.    [facility]

## 2023-08-18 ENCOUNTER — Observation Stay (HOSPITAL_BASED_OUTPATIENT_CLINIC_OR_DEPARTMENT_OTHER): Admitting: Anesthesiology

## 2023-08-18 ENCOUNTER — Other Ambulatory Visit: Payer: Self-pay

## 2023-08-18 ENCOUNTER — Observation Stay (HOSPITAL_COMMUNITY): Admitting: Anesthesiology

## 2023-08-18 ENCOUNTER — Encounter (HOSPITAL_COMMUNITY)
Admission: EM | Disposition: A | Discharge status: Home / Self Care | Payer: Self-pay | Source: Home / Self Care | Attending: Emergency Medicine

## 2023-08-18 ENCOUNTER — Observation Stay (HOSPITAL_COMMUNITY)

## 2023-08-18 DIAGNOSIS — K8000 Calculus of gallbladder with acute cholecystitis without obstruction: Secondary | ICD-10-CM | POA: Diagnosis not present

## 2023-08-18 DIAGNOSIS — K8012 Calculus of gallbladder with acute and chronic cholecystitis without obstruction: Secondary | ICD-10-CM | POA: Diagnosis not present

## 2023-08-18 DIAGNOSIS — K449 Diaphragmatic hernia without obstruction or gangrene: Secondary | ICD-10-CM | POA: Diagnosis not present

## 2023-08-18 DIAGNOSIS — K658 Other peritonitis: Secondary | ICD-10-CM | POA: Diagnosis not present

## 2023-08-18 DIAGNOSIS — K8020 Calculus of gallbladder without cholecystitis without obstruction: Secondary | ICD-10-CM | POA: Diagnosis not present

## 2023-08-18 DIAGNOSIS — R1013 Epigastric pain: Secondary | ICD-10-CM | POA: Diagnosis not present

## 2023-08-18 DIAGNOSIS — K819 Cholecystitis, unspecified: Secondary | ICD-10-CM | POA: Diagnosis not present

## 2023-08-18 DIAGNOSIS — K8050 Calculus of bile duct without cholangitis or cholecystitis without obstruction: Secondary | ICD-10-CM | POA: Diagnosis not present

## 2023-08-18 HISTORY — PX: INDOCYANINE GREEN FLUORESCENCE IMAGING (ICG): SHX7595

## 2023-08-18 HISTORY — PX: CHOLECYSTECTOMY: SHX55

## 2023-08-18 LAB — COMPREHENSIVE METABOLIC PANEL WITH GFR
ALT: 74 U/L — ABNORMAL HIGH (ref 0–44)
AST: 63 U/L — ABNORMAL HIGH (ref 15–41)
Albumin: 3.6 g/dL (ref 3.5–5.0)
Alkaline Phosphatase: 76 U/L (ref 38–126)
Anion gap: 9 (ref 5–15)
BUN: 10 mg/dL (ref 8–23)
CO2: 26 mmol/L (ref 22–32)
Calcium: 8.5 mg/dL — ABNORMAL LOW (ref 8.9–10.3)
Chloride: 102 mmol/L (ref 98–111)
Creatinine, Ser: 0.7 mg/dL (ref 0.44–1.00)
GFR, Estimated: >60 mL/min (ref >60)
Glucose, Bld: 107 mg/dL — ABNORMAL HIGH (ref 70–99)
Potassium: 3.9 mmol/L (ref 3.5–5.1)
Sodium: 137 mmol/L (ref 135–145)
Total Bilirubin: 1.9 mg/dL — ABNORMAL HIGH (ref 0.0–1.2)
Total Protein: 6.6 g/dL (ref 6.5–8.1)

## 2023-08-18 SURGERY — LAPAROSCOPIC CHOLECYSTECTOMY
Anesthesia: General | Site: Abdomen

## 2023-08-18 MED ORDER — DIPHENHYDRAMINE HCL 50 MG/ML IJ SOLN
12.5000 mg | Freq: Four times a day (QID) | INTRAMUSCULAR | Status: DC | PRN
Start: 2023-08-18 — End: 2023-08-19

## 2023-08-18 MED ORDER — DEXAMETHASONE SODIUM PHOSPHATE 10 MG/ML IJ SOLN
INTRAMUSCULAR | Status: DC | PRN
  Administered 2023-08-18: 8 mg via INTRAVENOUS

## 2023-08-18 MED ORDER — DEXMEDETOMIDINE HCL IN NACL 80 MCG/20ML IV SOLN
INTRAVENOUS | Status: DC | PRN
Start: 2023-08-18 — End: 2023-08-18
  Administered 2023-08-18 (×2): 8 ug via INTRAVENOUS
  Administered 2023-08-18 (×4): 4 ug via INTRAVENOUS
  Administered 2023-08-18: 8 ug via INTRAVENOUS

## 2023-08-18 MED ORDER — PROPOFOL 1000 MG/100ML IV EMUL
INTRAVENOUS | Status: AC
  Filled 2023-08-18: qty 100

## 2023-08-18 MED ORDER — PROPOFOL 500 MG/50ML IV EMUL
INTRAVENOUS | Status: DC | PRN
  Administered 2023-08-18: 150 ug/kg/min via INTRAVENOUS

## 2023-08-18 MED ORDER — ACETAMINOPHEN 10 MG/ML IV SOLN
INTRAVENOUS | Status: AC
  Filled 2023-08-18: qty 100

## 2023-08-18 MED ORDER — FENTANYL CITRATE (PF) 100 MCG/2ML IJ SOLN
INTRAMUSCULAR | Status: AC
  Filled 2023-08-18: qty 2

## 2023-08-18 MED ORDER — GLUCAGON HCL RDNA (DIAGNOSTIC) 1 MG IJ SOLR
INTRAMUSCULAR | Status: AC
  Filled 2023-08-18: qty 1

## 2023-08-18 MED ORDER — INDOCYANINE GREEN 25 MG IV SOLR
INTRAVENOUS | Status: DC | PRN
  Administered 2023-08-18: 1.25 mg via INTRAVENOUS

## 2023-08-18 MED ORDER — PHENYLEPHRINE 80 MCG/ML (10ML) SYRINGE FOR IV PUSH (FOR BLOOD PRESSURE SUPPORT)
PREFILLED_SYRINGE | INTRAVENOUS | Status: AC
  Filled 2023-08-18: qty 10

## 2023-08-18 MED ORDER — EPHEDRINE SULFATE-NACL 50-0.9 MG/10ML-% IV SOSY
PREFILLED_SYRINGE | INTRAVENOUS | Status: DC | PRN
Start: 2023-08-18 — End: 2023-08-18
  Administered 2023-08-18: 10 mg via INTRAVENOUS

## 2023-08-18 MED ORDER — LIDOCAINE HCL (PF) 2 % IJ SOLN
INTRAMUSCULAR | Status: AC
  Filled 2023-08-18: qty 5

## 2023-08-18 MED ORDER — POTASSIUM CHLORIDE IN NACL 20-0.9 MEQ/L-% IV SOLN
INTRAVENOUS | Status: AC
  Filled 2023-08-18: qty 1000

## 2023-08-18 MED ORDER — FENTANYL CITRATE PF 50 MCG/ML IJ SOSY: 25.0000 ug | PREFILLED_SYRINGE | INTRAMUSCULAR | Status: DC | PRN

## 2023-08-18 MED ORDER — DEXMEDETOMIDINE HCL IN NACL 80 MCG/20ML IV SOLN
INTRAVENOUS | Status: AC
  Filled 2023-08-18: qty 20

## 2023-08-18 MED ORDER — ENOXAPARIN SODIUM 40 MG/0.4ML IJ SOSY
40.0000 mg | PREFILLED_SYRINGE | INTRAMUSCULAR | Status: DC
  Administered 2023-08-19: 40 mg via SUBCUTANEOUS
  Filled 2023-08-18: qty 0.4

## 2023-08-18 MED ORDER — KETOROLAC TROMETHAMINE 30 MG/ML IJ SOLN
INTRAMUSCULAR | Status: AC
  Filled 2023-08-18: qty 1

## 2023-08-18 MED ORDER — ONDANSETRON HCL 4 MG/2ML IJ SOLN
INTRAMUSCULAR | Status: AC
  Filled 2023-08-18: qty 2

## 2023-08-18 MED ORDER — LIDOCAINE HCL (PF) 2 % IJ SOLN
INTRAMUSCULAR | Status: DC | PRN
  Administered 2023-08-18: 100 mg via INTRADERMAL

## 2023-08-18 MED ORDER — DIPHENHYDRAMINE HCL 12.5 MG/5ML PO ELIX: 12.5000 mg | ORAL_SOLUTION | Freq: Four times a day (QID) | ORAL | Status: DC | PRN

## 2023-08-18 MED ORDER — ROCURONIUM BROMIDE 10 MG/ML (PF) SYRINGE
PREFILLED_SYRINGE | INTRAVENOUS | Status: DC | PRN
Start: 2023-08-18 — End: 2023-08-18
  Administered 2023-08-18: 50 mg via INTRAVENOUS
  Administered 2023-08-18: 10 mg via INTRAVENOUS

## 2023-08-18 MED ORDER — ROCURONIUM BROMIDE 10 MG/ML (PF) SYRINGE
PREFILLED_SYRINGE | INTRAVENOUS | Status: AC
  Filled 2023-08-18: qty 10

## 2023-08-18 MED ORDER — BUPIVACAINE-EPINEPHRINE (PF) 0.25% -1:200000 IJ SOLN
INTRAMUSCULAR | Status: AC
  Filled 2023-08-18: qty 30

## 2023-08-18 MED ORDER — SIMETHICONE 80 MG PO CHEW
40.0000 mg | CHEWABLE_TABLET | Freq: Four times a day (QID) | ORAL | Status: DC | PRN
Start: 2023-08-18 — End: 2023-08-19

## 2023-08-18 MED ORDER — OXYCODONE HCL 5 MG PO TABS: 5.0000 mg | ORAL_TABLET | Freq: Once | ORAL | Status: DC | PRN

## 2023-08-18 MED ORDER — DROPERIDOL 2.5 MG/ML IJ SOLN: 0.6250 mg | Freq: Once | INTRAMUSCULAR | Status: DC | PRN

## 2023-08-18 MED ORDER — KETOROLAC TROMETHAMINE 30 MG/ML IJ SOLN
INTRAMUSCULAR | Status: DC | PRN
Start: 2023-08-18 — End: 2023-08-18
  Administered 2023-08-18: 15 mg via INTRAVENOUS

## 2023-08-18 MED ORDER — PROPOFOL 10 MG/ML IV BOLUS
INTRAVENOUS | Status: DC | PRN
  Administered 2023-08-18: 150 mg via INTRAVENOUS
  Administered 2023-08-18: 20 mg via INTRAVENOUS
  Administered 2023-08-18: 30 mg via INTRAVENOUS

## 2023-08-18 MED ORDER — ESMOLOL HCL 100 MG/10ML IV SOLN
INTRAVENOUS | Status: AC
Start: 2023-08-18 — End: ?
  Filled 2023-08-18: qty 10

## 2023-08-18 MED ORDER — DEXAMETHASONE SODIUM PHOSPHATE 10 MG/ML IJ SOLN
INTRAMUSCULAR | Status: AC
  Filled 2023-08-18: qty 1

## 2023-08-18 MED ORDER — PROPOFOL 10 MG/ML IV BOLUS
INTRAVENOUS | Status: AC
  Filled 2023-08-18: qty 20

## 2023-08-18 MED ORDER — ONDANSETRON HCL 4 MG/2ML IJ SOLN
INTRAMUSCULAR | Status: DC | PRN
  Administered 2023-08-18: 4 mg via INTRAVENOUS

## 2023-08-18 MED ORDER — PHENYLEPHRINE HCL (PRESSORS) 10 MG/ML IV SOLN
INTRAVENOUS | Status: DC | PRN
  Administered 2023-08-18: 160 ug via INTRAVENOUS

## 2023-08-18 MED ORDER — ACETAMINOPHEN 500 MG PO TABS
1000.0000 mg | ORAL_TABLET | Freq: Four times a day (QID) | ORAL | Status: DC
  Administered 2023-08-18 – 2023-08-19 (×3): 1000 mg via ORAL
  Filled 2023-08-18 (×5): qty 2

## 2023-08-18 MED ORDER — BUPIVACAINE-EPINEPHRINE 0.25% -1:200000 IJ SOLN
INTRAMUSCULAR | Status: DC | PRN
  Administered 2023-08-18: 30 mL

## 2023-08-18 MED ORDER — ACETAMINOPHEN 10 MG/ML IV SOLN
INTRAVENOUS | Status: DC | PRN
Start: 2023-08-18 — End: 2023-08-18
  Administered 2023-08-18: 1000 mg via INTRAVENOUS

## 2023-08-18 MED ORDER — EPHEDRINE 5 MG/ML INJ
INTRAVENOUS | Status: AC
  Filled 2023-08-18: qty 5

## 2023-08-18 MED ORDER — OXYCODONE HCL 5 MG/5ML PO SOLN: 5.0000 mg | Freq: Once | ORAL | Status: DC | PRN

## 2023-08-18 MED ORDER — METHOCARBAMOL 500 MG PO TABS: 500.0000 mg | ORAL_TABLET | Freq: Four times a day (QID) | ORAL | Status: DC | PRN

## 2023-08-18 MED ORDER — BOOST / RESOURCE BREEZE PO LIQD CUSTOM
1.0000 | Freq: Three times a day (TID) | ORAL | Status: DC
  Administered 2023-08-18: 1 via ORAL

## 2023-08-18 MED ORDER — SUGAMMADEX SODIUM 200 MG/2ML IV SOLN
INTRAVENOUS | Status: DC | PRN
  Administered 2023-08-18: 200 mg via INTRAVENOUS

## 2023-08-18 MED ORDER — LACTATED RINGERS IR SOLN
Status: DC | PRN
  Administered 2023-08-18: 1000 mL

## 2023-08-18 MED ORDER — LACTATED RINGERS IV SOLN
INTRAVENOUS | Status: DC | PRN
Start: 2023-08-18 — End: 2023-08-18

## 2023-08-18 SURGICAL SUPPLY — 48 items
APPLICATOR ARISTA FLEXITIP XL (MISCELLANEOUS) IMPLANT
BAG COUNTER SPONGE SURGICOUNT (BAG) IMPLANT
CABLE HIGH FREQUENCY MONO STRZ (ELECTRODE) ×3 IMPLANT
CHLORAPREP W/TINT 26 (MISCELLANEOUS) ×3 IMPLANT
CLIP APPLIE 5 13 M/L LIGAMAX5 (MISCELLANEOUS) IMPLANT
CLIP APPLIE ROT 10 11.4 M/L (STAPLE) IMPLANT
CLIP LIGATING HEMO LOK XL GOLD (MISCELLANEOUS) IMPLANT
CLIP LIGATING HEMO O LOK GREEN (MISCELLANEOUS) IMPLANT
COVER MAYO STAND XLG (MISCELLANEOUS) IMPLANT
COVER SURGICAL LIGHT HANDLE (MISCELLANEOUS) ×3 IMPLANT
DERMABOND ADVANCED .7 DNX12 (GAUZE/BANDAGES/DRESSINGS) IMPLANT
DRAIN CHANNEL 19F RND (DRAIN) IMPLANT
DRAPE C-ARM 42X120 X-RAY (DRAPES) IMPLANT
DRSG TEGADERM 2-3/8X2-3/4 SM (GAUZE/BANDAGES/DRESSINGS) ×9 IMPLANT
DRSG TEGADERM 4X4.75 (GAUZE/BANDAGES/DRESSINGS) ×3 IMPLANT
ELECT REM PT RETURN 15FT ADLT (MISCELLANEOUS) ×3 IMPLANT
EVACUATOR SILICONE 100CC (DRAIN) IMPLANT
GAUZE SPONGE 2X2 8PLY STRL LF (GAUZE/BANDAGES/DRESSINGS) ×3 IMPLANT
GLOVE BIO SURGEON STRL SZ7.5 (GLOVE) ×3 IMPLANT
GLOVE INDICATOR 8.0 STRL GRN (GLOVE) ×3 IMPLANT
GOWN STRL REUS W/ TWL XL LVL3 (GOWN DISPOSABLE) ×3 IMPLANT
GRASPER SUT TROCAR 14GX15 (MISCELLANEOUS) IMPLANT
HEMOSTAT ARISTA ABSORB 3G PWDR (HEMOSTASIS) IMPLANT
HEMOSTAT SNOW SURGICEL 2X4 (HEMOSTASIS) IMPLANT
IRRIGATION SUCT STRKRFLW 2 WTP (MISCELLANEOUS) ×3 IMPLANT
KIT BASIN OR (CUSTOM PROCEDURE TRAY) ×3 IMPLANT
KIT TURNOVER KIT A (KITS) IMPLANT
LHOOK LAP DISP 36CM (ELECTROSURGICAL) IMPLANT
POUCH RETRIEVAL ECOSAC 10 (ENDOMECHANICALS) ×3 IMPLANT
POWDER SURGICEL 3.0 GRAM (HEMOSTASIS) IMPLANT
SCISSORS LAP 5X35 DISP (ENDOMECHANICALS) ×3 IMPLANT
SET CHOLANGIOGRAPH MIX (MISCELLANEOUS) IMPLANT
SET TUBE SMOKE EVAC HIGH FLOW (TUBING) ×3 IMPLANT
SLEEVE ADV FIXATION 5X100MM (TROCAR) ×3 IMPLANT
SPIKE FLUID TRANSFER (MISCELLANEOUS) ×3 IMPLANT
STRIP CLOSURE SKIN 1/2X4 (GAUZE/BANDAGES/DRESSINGS) ×3 IMPLANT
SUT ETHILON 2 0 PS N (SUTURE) IMPLANT
SUT MNCRL AB 4-0 PS2 18 (SUTURE) ×3 IMPLANT
SUT VIC AB 0 UR5 27 (SUTURE) IMPLANT
SUT VICRYL 0 TIES 12 18 (SUTURE) IMPLANT
SUT VICRYL 0 UR6 27IN ABS (SUTURE) IMPLANT
TIP ENDOSCOPIC SURGICEL (TIP) IMPLANT
TOWEL OR 17X26 10 PK STRL BLUE (TOWEL DISPOSABLE) ×3 IMPLANT
TRAY LAPAROSCOPIC (CUSTOM PROCEDURE TRAY) ×3 IMPLANT
TROCAR ADV FIXATION 12X100MM (TROCAR) IMPLANT
TROCAR ADV FIXATION 5X100MM (TROCAR) ×3 IMPLANT
TROCAR BALLN 12MMX100 BLUNT (TROCAR) IMPLANT
TROCAR XCEL NON-BLD 5MMX100MML (ENDOMECHANICALS) IMPLANT

## 2023-08-18 NOTE — Transfer of Care (Signed)
 Immediate Anesthesia Transfer of Care Note  Patient: Barbara [name]  Procedure(s) Performed: LAPAROSCOPIC CHOLECYSTECTOMY (Abdomen) INDOCYANINE GREEN  FLUORESCENCE IMAGING (ICG)  Patient Location: PACU  Anesthesia Type:General  Level of Consciousness: drowsy and patient cooperative  Airway & Oxygen Therapy: Patient Spontanous Breathing and Patient connected to face mask oxygen  Post-op Assessment: Report given to RN and Post -op Vital signs reviewed and stable  Post vital signs: Reviewed and stable  Last Vitals:  Vitals Value Taken Time  BP 141/76 08/18/23 1011  Temp 37.2 C 08/18/23 1011  Pulse 79 08/18/23 1014  Resp 28 08/18/23 1014  SpO2 92 % 08/18/23 1014  Vitals shown include unfiled device data.  Last Pain:  Vitals:   08/18/23 1011  TempSrc:   PainSc: 0-No pain      Patients Stated Pain Goal: 5 (08/16/23 1309)  Complications: No notable events documented.

## 2023-08-18 NOTE — Progress Notes (Signed)
 Pt off unit to OR

## 2023-08-18 NOTE — Interval H&P Note (Signed)
 History and Physical Interval Note:  08/18/2023 7:58 AM  Barbara [name]  has presented today for surgery, with the diagnosis of cholecystitis.  The various methods of treatment have been discussed with the patient and family. After consideration of risks, benefits and other options for treatment, the patient has consented to  Procedure(s): LAPAROSCOPIC CHOLECYSTECTOMY WITH INTRAOPERATIVE CHOLANGIOGRAM (N/A) INDOCYANINE GREEN  FLUORESCENCE IMAGING (ICG) (N/A) as a surgical intervention.  The patient's history has been reviewed, patient examined, no change in status, stable for surgery.  I have reviewed the patient's chart and labs.  Questions were answered to the patient's satisfaction.    Her LFTs are slightly increasing but they have not significantly bumped I think it still reasonable to go to the operating room and do laparoscopic cholecystectomy with intraoperative cholangiogram.  I did discuss the possibility of a common bile duct stone which would warrant additional procedures such as ERCP  I believe the patient's symptoms are consistent with gallbladder disease.   I discussed laparoscopic cholecystectomy with IOC in detail.  The patient was given educational material as well as diagrams detailing the procedure.  We discussed the risks and benefits of a laparoscopic cholecystectomy including, but not limited to bleeding, infection, injury to surrounding structures such as the intestine or liver, bile leak, retained gallstones, need to convert to an open procedure, prolonged diarrhea, blood clots such as  DVT, common bile duct injury, anesthesia risks, and possible need for additional procedures.  We discussed the typical post-operative recovery course. I explained that the likelihood of improvement of their symptoms is good.  Did discuss her nickel allergy.  I did discuss that we will be some instruments that will come in contact with her tissue that may be metal.  She said that would be fine she  just cannot have any indwelling metal left.  I told her that we would use hemolock clips which are plastic on the cystic duct and cystic artery.   Aldean Hummingbird

## 2023-08-18 NOTE — Anesthesia Preprocedure Evaluation (Addendum)
 Anesthesia Evaluation  Patient identified by MRN, date of birth, ID band Patient awake    Reviewed: Allergy & Precautions, NPO status , Patient's Chart, lab work & pertinent test results  History of Anesthesia Complications (+) PONV and history of anesthetic complications  Airway Mallampati: II  TM Distance: >3 FB Neck ROM: Full    Dental no notable dental hx.    Pulmonary asthma    Pulmonary exam normal        Cardiovascular hypertension, Normal cardiovascular exam     Neuro/Psych  Headaches  Anxiety        GI/Hepatic Neg liver ROS, hiatal hernia,GERD  Medicated,,  Endo/Other  negative endocrine ROS    Renal/GU negative Renal ROS     Musculoskeletal  (+) Arthritis ,    Abdominal   Peds  Hematology negative hematology ROS (+)   Anesthesia Other Findings Day of surgery medications reviewed with patient.  Reproductive/Obstetrics                             Anesthesia Physical Anesthesia Plan  ASA: 2  Anesthesia Plan: General   Post-op Pain Management: Toradol  IV (intra-op)*, Ofirmev  IV (intra-op)* and Precedex   Induction: Intravenous  PONV Risk Score and Plan: 4 or greater and Treatment may vary due to age or medical condition, Ondansetron , Dexamethasone , Midazolam , Propofol  infusion and TIVA  Airway Management Planned: Oral ETT  Additional Equipment: None  Intra-op Plan:   Post-operative Plan: Extubation in OR  Informed Consent: I have reviewed the patients History and Physical, chart, labs and discussed the procedure including the risks, benefits and alternatives for the proposed anesthesia with the patient or authorized representative who has indicated his/her understanding and acceptance.     Dental advisory given  Plan Discussed with: CRNA  Anesthesia Plan Comments: (Patient is adamant that she not receive opioids intraoperatively due to her history of PONV. Will  plan IV tylenol  and ketorolac  as well as TIVA. Jarrell Merritts, MD)       Anesthesia Quick Evaluation

## 2023-08-18 NOTE — Anesthesia Procedure Notes (Addendum)
 Procedure Name: Intubation Date/Time: 08/18/2023 8:11 AM  Performed by: Juluis Ok, CRNAPre-anesthesia Checklist: Patient identified, Emergency Drugs available, Suction available and Patient being monitored Patient Re-evaluated:Patient Re-evaluated prior to induction Oxygen Delivery Method: Circle system utilized Preoxygenation: Pre-oxygenation with 100% oxygen Induction Type: IV induction Ventilation: Mask ventilation without difficulty Laryngoscope Size: Mac and 4 Grade View: Grade III Tube type: Oral Tube size: 7.0 mm Number of attempts: 1 Airway Equipment and Method: Stylet Placement Confirmation: ETT inserted through vocal cords under direct vision, positive ETCO2, CO2 detector and breath sounds checked- equal and bilateral Secured at: 21 cm Tube secured with: Tape Dental Injury: Teeth and Oropharynx as per pre-operative assessment  Comments: Atraumatic intubation x 1. Anterior view. Lips and teeth remain in preoperative condition.

## 2023-08-18 NOTE — Op Note (Addendum)
 Barbara [name] [id number] 1950-12-24 08/18/2023  Laparoscopic Cholecystectomy with near infrared fluorescent cholangiography procedure Note  Indications: This patient presents with symptomatic gallbladder disease and will undergo laparoscopic cholecystectomy.  Pre-operative Diagnosis: symptomatic cholelithiasis   Post-operative Diagnosis: severe acute calculous cholecystitis  Surgeon: Aldean Hummingbird MD FACS  Assistants: none  Anesthesia: General endotracheal anesthesia  Procedure Details  The patient was seen again in the Holding Room. The risks, benefits, complications, treatment options, and expected outcomes were discussed with the patient. The possibilities of reaction to medication, pulmonary aspiration, perforation of viscus, bleeding, recurrent infection, finding a normal gallbladder, the need for additional procedures, failure to diagnose a condition, the possible need to convert to an open procedure, and creating a complication requiring transfusion or operation were discussed with the patient. The likelihood of improving the patient's symptoms with return to their baseline status is good.  The patient and/or family concurred with the proposed plan, giving informed consent. The site of surgery properly noted. The patient was taken to Operating Room, identified as Barbara [name] and the procedure verified as Laparoscopic Cholecystectomy with ICG dye.  A Time Out was held and the above information confirmed. Antibiotic prophylaxis was administered.    ICG dye was administered preoperatively.    General endotracheal anesthesia was then administered and tolerated well. After the induction, the abdomen was prepped with Chloraprep and draped in the sterile fashion. The patient was positioned in the supine position.  Access to the abdomen was obtained via the Optiview technique since the patient had a umbilical hernia.  A small incision was made to the left of the midline just below the  subcostal margin.  Then using a 0 degree 5 mm laparoscope through a 5 mm trocar the laparoscope was advanced through all layers of the abdominal wall and carefully entered the abdominal cavity.  Pneumoperitoneum was smoothly established up to a patient pressure of 15 mmHg without any change in patient vital signs.  The laparoscope was advanced in the abdominal cavity was surveilled.  There is no evidence of injury to surrounding structures. We positioned the patient in reverse Trendelenburg, tilted slightly to the patient's left.  A 5 mm port was placed in the supraumbilical position under direct visualization.  She had no overt fascial defect at the umbilicus it was just severe diastases at the umbilicus.  The optical entry trocar was exchanged for a 12 mm trocar.  Two 5-mm ports were placed in the right upper quadrant. All skin incisions were infiltrated with a local anesthetic agent before making the incision and placing the trocars.     The gallbladder was identified.the gallbladder was very distended and thick-walled and had a lot of edema in the gallbladder wall.  In order to facilitate retraction I had to aspirate the gallbladder.  The fundus grasped and retracted cephalad. Adhesions were lysed bluntly and with the electrocautery where indicated, taking care not to injure any adjacent organs or viscus.  The patient had an inflammatory rind around the lower body and infundibulum of the gallbladder.  I ended up incising this with hook electrocautery and then gently dissecting it away with the suction irrigator catheter bluntly.  The tissue in this area was friable and oozy.  The infundibulum was grasped and retracted laterally, exposing the peritoneum overlying the triangle of Calot. This was then divided and exposed in a blunt fashion. A critical view of the cystic duct and cystic artery was obtained.  There was some inflammatory rind behind the cystic duct.  The cystic duct was clearly identified and  bluntly dissected circumferentially.  Utilizing the Stryker camera system near infrared fluorescent cholangiography activity was visualized in the liver, common hepatic duct and common bile duct and small bowel.  No infrared fluorescent activity was visualized in the cystic duct.  Since there was that inflammatory rind posterior to the cystic duct I decided to mobilize the body of the gallbladder off of the liver.  This was gently done with the suction irrigator catheter.  There were no other structures entering the gallbladder other than the cystic artery and the cystic duct.  I had lifted the gallbladder off of the hepatic plate.  This served as a secondary confirmation of our anatomy.  Due to her nickel allergy two Hem-o-lok clips were placed on the cystic artery.  1 hematologic clip was placed on the gallbladder side of the cystic artery and then the cystic artery was transected.  She did have a small branch running on top of the cystic duct and a small Hem-o-lok clip was placed on that.  As I was retracting on the infundibulum the cystic duct essentially became avulsed from the infundibulum.  Therefore I did not feel comfortable trying to do a formal intraoperative cholangiogram.  I was able to grab the cystic duct stump.  I was able to visualize a little bit of bile coming out of it.  2 Hem-o-lok clips were placed on the cystic duct stump.  They were completely all the way across.  The gallbladder was dissected from the liver bed in retrograde fashion with the electrocautery.  The posterior wall of the gallbladder was very stuck to the liver.  There was oozing from the gallbladder fossa.  The gallbladder was removed and placed in an Ecco sac.  The liver bed was irrigated and inspected. Hemostasis was achieved with the electrocautery. Copious irrigation was utilized and was repeatedly aspirated until clear.   I did elect to place Surgicel hemostatic powder on the gallbladder fossa.  I also elected to place  a round 15 [place] JP drain just to monitor for bile leak given the degree of cholecystitis she had and to monitor for any signs of leak from the cystic duct stump.  The drain was brought out through the right sided abdominal trocar and secured to the skin with a 2-0 nylon.    The gallbladder and Ecco sac were then removed through the left upper quadrant port site. We again inspected the right upper quadrant for hemostasis.  The left upper quadrant port site was closed with three 0 Vicryl using a PMI suture passer with laparoscopic guidance.  The left upper quadrant closure was inspected and there was no air leak and nothing trapped within the closure. Pneumoperitoneum was released as we removed the trocars.  4-0 Monocryl was used to close the skin.   Dermabond was applied. The patient was then extubated and brought to the recovery room in stable condition. Instrument, sponge, and needle counts were correct at closure and at the conclusion of the case.   Findings: Severe Cholecystitis with Cholelithiasis +surgicell hemostatic powder +critical view Near infrared fluorescent cholangiography demonstrated activity within the liver, common hepatic, common bile duct and small bowel.  No activity visualized in the gallbladder or cystic duct  Estimated Blood Loss: Minimal         Drains: 19 fr JP         Specimens: Gallbladder           Complications: None; patient  tolerated the procedure well.         Disposition: PACU - hemodynamically stable.         Condition: stable  Marianna Shirk. Elvan Hamel, MD, FACS General, Bariatric, & Minimally Invasive Surgery [facility],  [facility]

## 2023-08-18 NOTE — Progress Notes (Signed)
 [facility]  PROGRESS NOTE  Barbara [name] ZOX:[id number] DOB: [birth year] DOA: 08/16/2023 PCP: Barbara Bertin, MD   Brief HPI:    73 y.o. female with medical history significant for hypertension, hyperlipidemia, GERD, chronic neuropathic pain being admitted to the hospital with intractable epigastric abdominal pain.     Assessment/Plan:   Abdominal pain/status post cholecystectomy -RUQ US  showing gallstones only. EGD was completed yesterday to rule out GI pathology.  bilirubin is now slightly elevated.  General Surgery consulted; underwent  laparoscopic cholecystectomy.  - Management per general surgery   Hypertension - Continue hydralazine  5 mg IV every 6 hours as needed    Medications     acetaminophen   1,000 mg Oral Q6H   [START ON 08/19/2023] enoxaparin  (LOVENOX ) injection  40 mg Subcutaneous Q24H   feeding supplement  1 Container Oral TID BM   pantoprazole  (PROTONIX ) IV  40 mg Intravenous Q24H     Data Reviewed:   CBG:  No results for input(s): "GLUCAP" in the last 168 hours.  SpO2: 92 % O2 Flow Rate (L/min): 3 L/min    Vitals:   08/18/23 1043 08/18/23 1046 08/18/23 1107 08/18/23 1239  BP:  (!) 150/81 (!) 157/80 (!) 154/81  Pulse: 81 79 82 80  Resp: (!) 25 (!) 23    Temp:  98.1 F (36.7 C) 98 F (36.7 C) 98.4 F (36.9 C)  TempSrc:      SpO2: 94% 94% 91% 92%  Weight:      Height:          Data Reviewed:  Basic Metabolic Panel: Recent Labs  Lab 08/16/23 0319 08/17/23 0403 08/18/23 0430  NA 140 139 137  K 3.3* 3.6 3.9  CL 103 106 102  CO2 22 26 26   GLUCOSE 127* 102* 107*  BUN 15 9 10   CREATININE 0.89 0.62 0.70  CALCIUM  9.4 9.0 8.5*    CBC: Recent Labs  Lab 08/16/23 0316 08/17/23 0403  WBC 9.1 8.9  HGB 13.6 12.8  HCT 41.9 39.0  MCV 90.3 91.3  PLT 289 236    LFT Recent Labs  Lab 08/16/23 0319 08/17/23 0403 08/18/23 0430  AST 27 44* 63*  ALT 44 62* 74*  ALKPHOS 76 63 76  BILITOT 0.5 1.5* 1.9*  PROT 7.5 6.3* 6.6   ALBUMIN 4.5 3.6 3.6     Antibiotics: Anti-infectives (From admission, onward)    Start     Dose/Rate Route Frequency Ordered Stop   08/17/23 2000  cefTRIAXone  (ROCEPHIN ) 2 g in sodium chloride  0.9 % 100 mL IVPB  Status:  Discontinued        2 g 200 mL/hr over 30 Minutes Intravenous Daily-1800 08/17/23 1806 08/18/23 1107        DVT prophylaxis: SCDs  Code Status: Full code  Family Communication: Discussed with patient's husband and daughter at bedside   CONSULTS    Subjective   Underwent cholecystectomy this morning.  Complains of mild right upper quadrant pain   Objective    Physical Examination:   Appears in no acute distress S1-S2, regular Lungs clear to auscultation bilaterally Abdomen is soft, nontender  Status is: Inpatient:             Barbara [name]   [facility] If 7PM-7AM, please contact night-coverage at [email or web address], Office  [phone]   08/18/2023, 5:29 PM  LOS: 0 days

## 2023-08-18 NOTE — Progress Notes (Incomplete)
 RN notified husband Dee Farber the time for pt's lap coli

## 2023-08-18 NOTE — Plan of Care (Signed)

## 2023-08-19 ENCOUNTER — Encounter (HOSPITAL_COMMUNITY): Payer: Self-pay | Admitting: General Surgery

## 2023-08-19 DIAGNOSIS — K8020 Calculus of gallbladder without cholecystitis without obstruction: Secondary | ICD-10-CM | POA: Diagnosis not present

## 2023-08-19 DIAGNOSIS — K8050 Calculus of bile duct without cholangitis or cholecystitis without obstruction: Secondary | ICD-10-CM | POA: Diagnosis not present

## 2023-08-19 DIAGNOSIS — K449 Diaphragmatic hernia without obstruction or gangrene: Secondary | ICD-10-CM | POA: Diagnosis not present

## 2023-08-19 DIAGNOSIS — R1013 Epigastric pain: Secondary | ICD-10-CM | POA: Diagnosis not present

## 2023-08-19 LAB — COMPREHENSIVE METABOLIC PANEL WITH GFR
ALT: 132 U/L — ABNORMAL HIGH (ref 0–44)
AST: 124 U/L — ABNORMAL HIGH (ref 15–41)
Albumin: 3.6 g/dL (ref 3.5–5.0)
Alkaline Phosphatase: 86 U/L (ref 38–126)
Anion gap: 11 (ref 5–15)
BUN: 9 mg/dL (ref 8–23)
CO2: 24 mmol/L (ref 22–32)
Calcium: 9.1 mg/dL (ref 8.9–10.3)
Chloride: 105 mmol/L (ref 98–111)
Creatinine, Ser: 0.78 mg/dL (ref 0.44–1.00)
GFR, Estimated: >60 mL/min (ref >60)
Glucose, Bld: 134 mg/dL — ABNORMAL HIGH (ref 70–99)
Potassium: 4.2 mmol/L (ref 3.5–5.1)
Sodium: 140 mmol/L (ref 135–145)
Total Bilirubin: 1.7 mg/dL — ABNORMAL HIGH (ref 0.0–1.2)
Total Protein: 7.2 g/dL (ref 6.5–8.1)

## 2023-08-19 LAB — CBC
HCT: 40.9 % (ref 36.0–46.0)
Hemoglobin: 12.9 g/dL (ref 12.0–15.0)
MCH: 29.6 pg (ref 26.0–34.0)
MCHC: 31.5 g/dL (ref 30.0–36.0)
MCV: 93.8 fL (ref 80.0–100.0)
Platelets: 248 10*3/uL (ref 150–400)
RBC: 4.36 MIL/uL (ref 3.87–5.11)
RDW: 12.8 % (ref 11.5–15.5)
WBC: 11 10*3/uL — ABNORMAL HIGH (ref 4.0–10.5)
nRBC: 0 % (ref 0.0–0.2)

## 2023-08-19 MED ORDER — AMLODIPINE BESYLATE 5 MG PO TABS: 5.0000 mg | ORAL_TABLET | Freq: Every day | ORAL | 3 refills | Status: AC

## 2023-08-19 MED ORDER — AMLODIPINE BESYLATE 5 MG PO TABS: 5.0000 mg | ORAL_TABLET | Freq: Every day | ORAL | Status: DC

## 2023-08-19 MED ORDER — OXYCODONE HCL 5 MG PO TABS: 5.0000 mg | ORAL_TABLET | Freq: Four times a day (QID) | ORAL | 0 refills | Status: AC | PRN

## 2023-08-19 NOTE — Discharge Summary (Addendum)
 Physician Discharge Summary   Patient: Barbara [name] MRN: [id number] DOB: 06-14-1950  Admit date:     08/16/2023  Discharge date: 08/19/23  Discharge Physician: [name]   PCP: Olin Bertin, MD   Recommendations at discharge:   Follow-up general surgery as outpatient Patient to get LFTs as outpatient Advised to avoid Tylenol  as it can cause liver damage, till LFTs improve Patient to go home with JP drain in place, home health PT. Home health RN for JP drain care  Discharge Diagnoses: Principal Problem:   Abdominal pain Active Problems:   Calculus of gallbladder without cholecystitis without obstruction   Recurrent biliary colic   Hiatal hernia  Resolved Problems:   * No resolved hospital problems. *  Hospital Course: 73 y.o. female with medical history significant for hypertension, hyperlipidemia, GERD, chronic neuropathic pain being admitted to the hospital with intractable epigastric abdominal pain.    Assessment and Plan:  Abdominal pain/status post cholecystectomy -RUQ US  showing gallstones only. EGD was completed yesterday to rule out GI pathology.  -EGD was normal General Surgery consulted; underwent  laparoscopic cholecystectomy.  - She has mildly elevated LFTs, general surgery recommends to discharge home and will follow-up as outpatient - Will check LFTs as outpatient at surgery office  Hypertension -Blood pressure has been elevated, -Will start amlodipine  5 mg daily            Consultants: General Surgery Procedures performed: Cholecystectomy Disposition: Home Diet recommendation:  Discharge Diet Orders (From admission, onward)     Start     Ordered   08/19/23 0000  Diet - low sodium heart healthy        08/19/23 1228           Regular diet DISCHARGE MEDICATION: Allergies as of 08/19/2023       Reactions   Ace Inhibitors Hives, Nausea Only   Nasal congestion   Azathioprine Other (See Comments)   "cardiac issues"   Celebrex  [celecoxib] Other (See Comments)   Severe stomach cramps   Crestor  [rosuvastatin ] Other (See Comments)   Bilat leg/heel pains/swelling   Fish Oil Other (See Comments)   Broke out with blisters   Forteo [parathyroid Hormone (recomb)]    Unknown   Gabapentin     Pain and swelling    Lanolin Acid [lanolin]    rash   Levofloxacin Other (See Comments)   "sore ankles"   Maxzide [triamterene -hctz] Other (See Comments)   Knee, ankles and back pain   Methadone Nausea And Vomiting   All narcotic pain killers   Methotrexate Derivatives Other (See Comments)   "cardiac issues"   Nickel    rash   Penicillins Hives   All penicillins   Prednisone  Other (See Comments)   High blood pressure.   Propoxyphene Hcl Nausea Only   Sulfonamide Derivatives Nausea Only, Other (See Comments)   Stomach cramping   Synvisc [hylan G-f 20]    Made knee swell up at injection site   Tramadol  Nausea And Vomiting        Medication List     STOP taking these medications    acetaminophen  500 MG tablet Commonly known as: TYLENOL        TAKE these medications    amLODipine  5 MG tablet Commonly known as: NORVASC  Take 1 tablet (5 mg total) by mouth daily.   b complex vitamins tablet Take 1 tablet by mouth daily.   ICAPS PO Take 1 capsule by mouth 2 (two) times daily.   loratadine   10 MG tablet Commonly known as: CLARITIN  Take 10 mg by mouth daily as needed for allergies.   Milk Thistle 250 MG Caps Take 250 mg by mouth 2 (two) times daily.   omeprazole  20 MG capsule Commonly known as: PRILOSEC Take 20 mg by mouth daily.   psyllium 95 % Pack Commonly known as: HYDROCIL/METAMUCIL Take 1 packet by mouth 2 (two) times daily.   Vitamin D-3 25 MCG (1000 UT) Caps Take 1,000 Units by mouth daily.        Follow-up Information     [facility], [facility]. Schedule an appointment as soon as possible for a visit on 08/24/2023.   Specialty: General Surgery Why: with NURSE for drain  removal Contact information: [address] [place] [address] [phone]         [facility], [facility] Follow up in 3 week(s).   Specialty: General Surgery Why: For wound re-check with [facility] Contact information: [address] [place] [address] [phone]         Care, [facility] Follow up.   Specialty: Home Health Services Contact information: [address] [place] [address] [phone]                Discharge Exam: [name]   08/16/23 0312 08/16/23 1309  Weight: 86.2 kg 86.2 kg   General-appears in no acute distress Heart-S1-S2, regular, no murmur auscultated Lungs-clear to auscultation bilaterally, no wheezing or crackles auscultated Abdomen-soft, nontender, no organomegaly Extremities-no edema in the lower extremities Neuro-alert, oriented x3, no focal deficit noted  Condition at discharge: good  The results of significant diagnostics from this hospitalization (including imaging, microbiology, ancillary and laboratory) are listed below for reference.   Imaging Studies: DG C-Arm 1-60 Min-No Report Result Date: 08/18/2023 Fluoroscopy was utilized by the requesting physician.  No radiographic interpretation.   CT ABDOMEN PELVIS W CONTRAST Result Date: 08/16/2023 CLINICAL DATA:  Abdominal pain acute nonlocalized EXAM: CT ABDOMEN AND PELVIS WITH CONTRAST TECHNIQUE: Multidetector CT imaging of the abdomen and pelvis was performed using the standard protocol following bolus administration of intravenous contrast. RADIATION DOSE REDUCTION: This exam was performed according to the departmental dose-optimization program which includes automated exposure control, adjustment of the mA and/or kV according to patient size and/or use of iterative reconstruction technique. CONTRAST:  OMNIPAQUE  IOHEXOL  300 MG/ML  SOLN COMPARISON:  Ultrasound August 16, 2023 FINDINGS: Lower chest: No acute abnormality.  Hepatobiliary: Gallstones without gallbladder inflammatory changes correlate with chronic cholelithiasis and correlate with prior ultrasound Bilobed cyst in the anterolateral margin of the right lobe of the liver correlates with prior ultrasound measuring 4 x 3.9 cm of no clinical significance. 6 mm cyst in the left lobe of the liver of no clinical significance. No other hepatic lesions or biliary dilatation. Pancreas: Unremarkable. No pancreatic ductal dilatation or surrounding inflammatory changes. Spleen: Adrenals/Urinary Tract: Adrenal glands are unremarkable. Kidneys are normal, without renal calculi, focal lesion, or hydronephrosis. Bladder is unremarkable. Stomach/Bowel: Stomach is within normal limits. Appendix appears normal. No evidence of bowel wall thickening, distention, or inflammatory changes. No evidence of diverticulitis or appendicitis. Vascular/Lymphatic: No significant vascular findings are present. No enlarged abdominal or pelvic lymph nodes. Reproductive: Uterus normal size with a 2 cm calcification in the left lower lateral uterine segment consistent with calcified fibroid. No other adnexal masses. Bladder normal. Other: No abdominal wall hernia or abnormality. No abdominopelvic ascites. Small anterior abdominal wall umbilical hernia containing fat Musculoskeletal:  No acute or significant osseous findings. Degenerative disc disease L4-L5. With a diffuse osteopenia IMPRESSION: *No acute findings in the abdomen or pelvis. *Cholelithiasis without gallbladder inflammatory changes correlate with prior ultrasound. *Small anterior abdominal wall umbilical hernia containing fat. *Calcified uterine fibroid. *Degenerative disc disease L4-L5. *Bilobed cyst in the anterolateral margin of the right lobe of the liver correlates with prior ultrasound measuring 4 x 3.9 cm of no clinical significance. 6 mm cyst in the left lobe of the liver of no clinical significance. Electronically Signed   By: Fredrich Jefferson  M.D.   On: 08/16/2023 08:36   US  Abdomen Limited RUQ (LIVER/GB) Result Date: 08/16/2023 CLINICAL DATA:  Epigastric pain. EXAM: ULTRASOUND ABDOMEN LIMITED RIGHT UPPER QUADRANT COMPARISON:  None Available. FINDINGS: Gallbladder: Gallstones identified which measure up to 0.9 cm. No gallbladder wall thickening, pericholecystic fluid or sonographic Murphy's sign. Common bile duct: Diameter: 6.1 mm.  No intrahepatic bile duct dilatation. Liver: Normal parenchymal echogenicity. There are bilobar liver cysts. The largest is in the inferior right lobe measuring 4.6 x 4.4 x 4.6 cm. This dominant cyst contains several internal areas septation, unchanged from 08/02/2022. Smaller cysts have more simple appearance. Portal vein is patent on color Doppler imaging with normal direction of blood flow towards the liver. Other: None. IMPRESSION: 1. Cholelithiasis without secondary signs of acute cholecystitis. 2. Common bile duct is upper limits of normal measuring 6 mm. 3. Bilobar liver cysts. The largest is in the inferior right lobe measuring 4.6 x 4.4 x 4.6 cm. This dominant cyst contains several internal areas septation, unchanged from 08/02/2022. Electronically Signed   By: Kimberley Penman M.D.   On: 08/16/2023 05:49   DG Chest Port 1 View Result Date: 08/16/2023 CLINICAL DATA:  Chest pain EXAM: PORTABLE CHEST 1 VIEW COMPARISON:  05/19/2022 FINDINGS: The heart size and mediastinal contours are within normal limits. Both lungs are clear. The visualized skeletal structures are unremarkable. IMPRESSION: No active disease. Electronically Signed   By: Worthy Heads M.D.   On: 08/16/2023 03:31    Microbiology: Results for orders placed or performed during the hospital encounter of 03/28/17  Surgical pcr screen     Status: None   Collection Time: 03/28/17 11:20 AM   Specimen: Nasal Mucosa; Nasal Swab  Result Value Ref Range Status   MRSA, PCR NEGATIVE NEGATIVE Final   Staphylococcus aureus NEGATIVE NEGATIVE Final     Comment: (NOTE) The Xpert SA Assay (FDA approved for NASAL specimens in patients 34 years of age and older), is one component of a comprehensive surveillance program. It is not intended to diagnose infection nor to guide or monitor treatment.     Labs: CBC: Recent Labs  Lab 08/16/23 0316 08/17/23 0403 08/19/23 0437  WBC 9.1 8.9 11.0*  HGB 13.6 12.8 12.9  HCT 41.9 39.0 40.9  MCV 90.3 91.3 93.8  PLT 289 236 248   Basic Metabolic Panel: Recent Labs  Lab 08/16/23 0319 08/17/23 0403 08/18/23 0430 08/19/23 0437  NA 140 139 137 140  K 3.3* 3.6 3.9 4.2  CL 103 106 102 105  CO2 22 26 26 24   GLUCOSE 127* 102* 107* 134*  BUN 15 9 10 9   CREATININE 0.89 0.62 0.70 0.78  CALCIUM  9.4 9.0 8.5* 9.1   Liver Function Tests: Recent Labs  Lab 08/16/23 0319 08/17/23 0403 08/18/23 0430 08/19/23 0437  AST 27 44* 63* 124*  ALT 44 62* 74* 132*  ALKPHOS 76 63 76 86  BILITOT 0.5 1.5* 1.9* 1.7*  PROT  7.5 6.3* 6.6 7.2  ALBUMIN 4.5 3.6 3.6 3.6   CBG: No results for input(s): "GLUCAP" in the last 168 hours.  Discharge time spent: greater than 30 minutes.  Signed: [name], MD [facility] 08/19/2023

## 2023-08-19 NOTE — Progress Notes (Signed)
 Called husband and let him know that I sent a prescription for oxycodone  to the [facility] in [place].  Also let him know that our office will contact him tomorrow with instructions on how to get the patient's labs checked as well as a nurse appointment for drain removal as well as a follow-up appointment with the surgical PA in our office  Marianna Shirk. Elvan Hamel, MD, FACS General, Bariatric, & Minimally Invasive Surgery [facility],  [facility]

## 2023-08-19 NOTE — Progress Notes (Signed)
 Patient ID: Barbara [name], female   DOB: 24-Feb-1951, 73 y.o.   MRN: [id number]   Acute Care Surgery Service Progress Note:    Chief Complaint/Subjective: Has had liquids.  No nausea or vomiting.  Had some drainage around the surgical drain site. Pain okay Has ambulated in room Has not received drain teaching Objective: Vital signs in last 24 hours: Temp:  [98 F (36.7 C)-99 F (37.2 C)] 98 F (36.7 C) (06/08 0426) Pulse Rate:  [71-85] 80 (06/08 0426) Resp:  [18-28] 18 (06/08 0426) BP: (138-179)/(76-89) 179/89 (06/08 0426) SpO2:  [91 %-95 %] 95 % (06/08 0426) Last BM Date : 08/16/23  Intake/Output from previous day: 06/07 0701 - 06/08 0700 In: 750 [I.V.:750] Out: 188 [Drains:160; Blood:20] Intake/Output this shift: No intake/output data recorded.  Lungs:  nonlabored  Cardiovascular: reg  Abd:, Obese, incisions okay.  Some serosanguineous drainage around JP site.  Serosanguineous fluid in surgical drain, mild appropriate tenderness  Extremities: no edema, +SCDs  Neuro: alert, nonfocal  Lab Results: CBC  Recent Labs    08/17/23 0403 08/19/23 0437  WBC 8.9 11.0*  HGB 12.8 12.9  HCT 39.0 40.9  PLT 236 248   BMET Recent Labs    08/18/23 0430 08/19/23 0437  NA 137 140  K 3.9 4.2  CL 102 105  CO2 26 24  GLUCOSE 107* 134*  BUN 10 9  CREATININE 0.70 0.78  CALCIUM  8.5* 9.1   LFT    Latest Ref Rng & Units 08/19/2023    4:37 AM 08/18/2023    4:30 AM 08/17/2023    4:03 AM  Hepatic Function  Total Protein 6.5 - 8.1 g/dL 7.2  6.6  6.3   Albumin 3.5 - 5.0 g/dL 3.6  3.6  3.6   AST 15 - 41 U/L 124  63  44   ALT 0 - 44 U/L 132  74  62   Alk Phosphatase 38 - 126 U/L 86  76  63   Total Bilirubin 0.0 - 1.2 mg/dL 1.7  1.9  1.5    PT/INR No results for input(s): "LABPROT", "INR" in the last 72 hours. ABG No results for input(s): "PHART", "HCO3" in the last 72 hours.  Invalid input(s): "PCO2", "PO2"  Studies/Results:  Anti-infectives: Anti-infectives (From  admission, onward)    Start     Dose/Rate Route Frequency Ordered Stop   08/17/23 2000  cefTRIAXone  (ROCEPHIN ) 2 g in sodium chloride  0.9 % 100 mL IVPB  Status:  Discontinued        2 g 200 mL/hr over 30 Minutes Intravenous Daily-1800 08/17/23 1806 08/18/23 1107       Medications: Scheduled Meds:  acetaminophen   1,000 mg Oral Q6H   enoxaparin  (LOVENOX ) injection  40 mg Subcutaneous Q24H   feeding supplement  1 Container Oral TID BM   pantoprazole  (PROTONIX ) IV  40 mg Intravenous Q24H   Continuous Infusions:  0.9 % NaCl with KCl 20 mEq / L 40 mL/hr at 08/18/23 1129   PRN Meds:.albuterol , diphenhydrAMINE  **OR** diphenhydrAMINE , hydrALAZINE , HYDROmorphone  (DILAUDID ) injection, methocarbamol , ondansetron  **OR** ondansetron  (ZOFRAN ) IV, oxyCODONE , simethicone  Assessment/Plan: Patient Active Problem List   Diagnosis Date Noted   Abdominal pain 08/16/2023   Calculus of gallbladder without cholecystitis without obstruction 08/16/2023   Recurrent biliary colic 08/16/2023   Hiatal hernia 08/16/2023   Degenerative arthritis of right knee 09/28/2022   Leg pain 08/08/2022   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, nickel  03/31/2017   Closed left tibial fracture 03/28/2017   Closed fracture  of left tibial plateau 03/28/2017   Nonintractable headache 01/25/2017   Visual field defect    Chest pain 09/16/2016   Essential tremor 09/16/2016   Right facial numbness 09/16/2016   Bilateral scleritis 08/29/2016   Obesity 06/01/2016   Dyspnea on exertion 05/13/2016   Hoarseness 03/17/2016   Gastroesophageal reflux disease without esophagitis 03/17/2016   Globus sensation 03/17/2016   Gastritis 02/25/2016   Atypical chest pain 02/24/2016   Painful orthopaedic hardware (HCC) 04/15/2013   Vitamin D deficiency 12/09/2009   HYPERCHOLESTEROLEMIA 12/09/2009   Essential hypertension 12/09/2009   Allergic rhinitis 12/09/2009   NEPHROLITHIASIS 12/09/2009   CERVICAL POLYP 12/09/2009   Osteoarthritis 12/09/2009    Cough variant asthma vs UACS  12/09/2009   s/p Procedure(s): LAPAROSCOPIC CHOLECYSTECTOMY INDOCYANINE GREEN  FLUORESCENCE IMAGING (ICG) 08/18/2023  Doing well.  No signs of complications. Unable to perform actual fluoroscopic intraoperative cholangiogram due to the inflammation Total bilirubin is down today 1.7 from 1.9.  Transaminases slightly up from yesterday but I think that is due to surgery My suspicion for choledocholithiasis is low  Advance diet Needs drain teaching Okay for chemical VTE prophylaxis  Disposition: I think patient could go home today but would need outpatient LFTs in a few days just to make sure that they are trending down and there is no underlying choledocholithiasis.  She would go home with surgical drain to monitor for bile leak.  Other option is to keep today and repeat LFTs in the morning  Will go ahead and put our discharge instructions and follow-up on the chart Discussed discharge instructions with patient  LOS: 0 days    Marianna Shirk. Elvan Hamel, MD, FACS General, Bariatric, & Minimally Invasive Surgery [phone] [facility], [facility]

## 2023-08-19 NOTE — TOC Progression Note (Signed)
 Transition of Care [facility]) - Progression Note    Patient Details  Name: Barbara [name] MRN: [id number] Date of Birth: [birth year]  Transition of Care [facility]) CM/SW Contact  Amaryllis Junior, [place] Phone Number: 08/19/2023, 12:26 PM  Clinical Narrative:    HH setup w/ [facility]. [facility] HH information added to AVS.   Expected Discharge Plan: Home/Self Care Barriers to Discharge: Continued Medical Work up  Expected Discharge Plan and Services   Discharge Planning Services: CM Consult Post Acute Care Choice: NA Living arrangements for the past 2 months: Single Family Home                 DME Arranged: N/A DME Agency: NA       HH Arranged: NA HH Agency: NA         Social Determinants of Health (SDOH) Interventions SDOH Screenings   Food Insecurity: No Food Insecurity (08/16/2023)  Housing: Low Risk  (08/16/2023)  Transportation Needs: No Transportation Needs (08/16/2023)  Utilities: Not At Risk (08/16/2023)  Social Connections: Socially Integrated (08/16/2023)  Tobacco Use: Low Risk  (08/16/2023)    Readmission Risk Interventions     No data to display

## 2023-08-19 NOTE — Anesthesia Postprocedure Evaluation (Signed)
 Anesthesia Post Note  Patient: Barbara [name]  Procedure(s) Performed: LAPAROSCOPIC CHOLECYSTECTOMY (Abdomen) INDOCYANINE GREEN  FLUORESCENCE IMAGING (ICG)     Patient location during evaluation: PACU Anesthesia Type: General Level of consciousness: awake and alert Pain management: pain level controlled Vital Signs Assessment: post-procedure vital signs reviewed and stable Respiratory status: spontaneous breathing, nonlabored ventilation and respiratory function stable Cardiovascular status: blood pressure returned to baseline Postop Assessment: no apparent nausea or vomiting Anesthetic complications: no   No notable events documented.         Rayfield Cairo

## 2023-08-19 NOTE — Discharge Instructions (Signed)
 LAPAROSCOPIC SURGERY: POST OP INSTRUCTIONS Always review your discharge instruction sheet given to you by the facility where your surgery was performed. IF YOU HAVE DISABILITY OR FAMILY LEAVE FORMS, YOU MUST BRING THEM TO THE OFFICE FOR PROCESSING.   DO NOT GIVE THEM TO YOUR DOCTOR.  PAIN CONTROL  First take acetaminophen  (Tylenol ) AND/or ibuprofen (Advil) to control your pain after surgery.  Follow directions on package.  Taking acetaminophen  (Tylenol ) and/or ibuprofen (Advil) regularly after surgery will help to control your pain and lower the amount of prescription pain medication you may need.  You should not take more than 3,000 mg (3 grams) of acetaminophen  (Tylenol ) in 24 hours.  You should not take ibuprofen (Advil), aleve, motrin, naprosyn or other NSAIDS if you have a history of stomach ulcers or chronic kidney disease.  A prescription for pain medication may be given to you upon discharge.  Take your pain medication as prescribed, if you still have uncontrolled pain after taking acetaminophen  (Tylenol ) or ibuprofen (Advil). Use ice packs to help control pain. If you need a refill on your pain medication, please contact your pharmacy.  They will contact our office to request authorization. Prescriptions will not be filled after 5pm or on week-ends.  HOME MEDICATIONS Take your usually prescribed medications unless otherwise directed.  DIET You should follow a light diet the first few days after arrival home.  Be sure to include lots of fluids daily. Avoid fatty, fried foods.   CONSTIPATION It is common to experience some constipation after surgery and if you are taking pain medication.  Increasing fluid intake and taking a stool softener (such as Colace) will usually help or prevent this problem from occurring.  A mild laxative (Milk of Magnesia or Miralax ) should be taken according to package instructions if there are no bowel movements after 48 hours.  WOUND/INCISION CARE Most  patients will experience some swelling and bruising in the area of the incisions.  Ice packs will help.  Swelling and bruising can take several days to resolve.  Unless discharge instructions indicate otherwise, follow guidelines below  STERI-STRIPS - you may remove your outer bandages 48 hours after surgery, and you may shower at that time.  You have steri-strips (small skin tapes) in place directly over the incision.  These strips should be left on the skin for 7-10 days.   DERMABOND/SKIN GLUE - you may shower in 24 hours.  The glue will flake off over the next 2-3 weeks. Any sutures or staples will be removed at the office during your follow-up visit.  ACTIVITIES You may resume regular (light) daily activities beginning the next day--such as daily self-care, walking, climbing stairs--gradually increasing activities as tolerated.  You may have sexual intercourse when it is comfortable.  Refrain from any heavy lifting or straining until approved by your doctor. You may drive when you are no longer taking prescription pain medication, you can comfortably wear a seatbelt, and you can safely maneuver your car and apply brakes.  FOLLOW-UP You should see your doctor in the office for a follow-up appointment approximately 2-3 weeks after your surgery.  You should have been given your post-op/follow-up appointment when your surgery was scheduled.  If you did not receive a post-op/follow-up appointment, make sure that you call for this appointment within a day or two after you arrive home to insure a convenient appointment time.  OTHER INSTRUCTIONS SEE DRAIN INSTRUCTIONS  WHEN TO CALL YOUR DOCTOR: Fever over 101.0 Inability to urinate Continued bleeding from incision.  Increased pain, redness, or drainage from the incision. Increasing abdominal pain  The clinic staff is available to answer your questions during regular business hours.  Please don't hesitate to call and ask to speak to one of the  nurses for clinical concerns.  If you have a medical emergency, go to the nearest emergency room or call 911.  A surgeon from [facility] is always on call at the hospital. [address], [address], [place], [place]  [address] ? [address] [address], [place], [place]   [address] [phone] ? [phone] ? FAX [phone] Web site: [email or web address]

## 2023-08-20 ENCOUNTER — Encounter (HOSPITAL_COMMUNITY): Payer: Self-pay | Admitting: Internal Medicine

## 2023-08-20 DIAGNOSIS — Z9049 Acquired absence of other specified parts of digestive tract: Secondary | ICD-10-CM | POA: Diagnosis not present

## 2023-08-21 DIAGNOSIS — G250 Essential tremor: Secondary | ICD-10-CM | POA: Diagnosis not present

## 2023-08-21 DIAGNOSIS — Z4803 Encounter for change or removal of drains: Secondary | ICD-10-CM | POA: Diagnosis not present

## 2023-08-21 DIAGNOSIS — I10 Essential (primary) hypertension: Secondary | ICD-10-CM | POA: Diagnosis not present

## 2023-08-21 DIAGNOSIS — M5136 Other intervertebral disc degeneration, lumbar region with discogenic back pain only: Secondary | ICD-10-CM | POA: Diagnosis not present

## 2023-08-21 DIAGNOSIS — G709 Myoneural disorder, unspecified: Secondary | ICD-10-CM | POA: Diagnosis not present

## 2023-08-21 DIAGNOSIS — Z9049 Acquired absence of other specified parts of digestive tract: Secondary | ICD-10-CM | POA: Diagnosis not present

## 2023-08-21 DIAGNOSIS — D329 Benign neoplasm of meninges, unspecified: Secondary | ICD-10-CM | POA: Diagnosis not present

## 2023-08-21 DIAGNOSIS — G500 Trigeminal neuralgia: Secondary | ICD-10-CM | POA: Diagnosis not present

## 2023-08-21 DIAGNOSIS — Z48815 Encounter for surgical aftercare following surgery on the digestive system: Secondary | ICD-10-CM | POA: Diagnosis not present

## 2023-08-21 LAB — SURGICAL PATHOLOGY

## 2023-08-23 DIAGNOSIS — Z9049 Acquired absence of other specified parts of digestive tract: Secondary | ICD-10-CM | POA: Diagnosis not present

## 2023-08-23 DIAGNOSIS — M5136 Other intervertebral disc degeneration, lumbar region with discogenic back pain only: Secondary | ICD-10-CM | POA: Diagnosis not present

## 2023-08-23 DIAGNOSIS — G500 Trigeminal neuralgia: Secondary | ICD-10-CM | POA: Diagnosis not present

## 2023-08-23 DIAGNOSIS — I10 Essential (primary) hypertension: Secondary | ICD-10-CM | POA: Diagnosis not present

## 2023-08-23 DIAGNOSIS — Z48815 Encounter for surgical aftercare following surgery on the digestive system: Secondary | ICD-10-CM | POA: Diagnosis not present

## 2023-08-23 DIAGNOSIS — Z4803 Encounter for change or removal of drains: Secondary | ICD-10-CM | POA: Diagnosis not present

## 2023-08-23 DIAGNOSIS — D329 Benign neoplasm of meninges, unspecified: Secondary | ICD-10-CM | POA: Diagnosis not present

## 2023-08-23 DIAGNOSIS — G709 Myoneural disorder, unspecified: Secondary | ICD-10-CM | POA: Diagnosis not present

## 2023-08-23 DIAGNOSIS — G250 Essential tremor: Secondary | ICD-10-CM | POA: Diagnosis not present

## 2023-08-24 DIAGNOSIS — M5136 Other intervertebral disc degeneration, lumbar region with discogenic back pain only: Secondary | ICD-10-CM | POA: Diagnosis not present

## 2023-08-24 DIAGNOSIS — Z4803 Encounter for change or removal of drains: Secondary | ICD-10-CM | POA: Diagnosis not present

## 2023-08-24 DIAGNOSIS — Z9049 Acquired absence of other specified parts of digestive tract: Secondary | ICD-10-CM | POA: Diagnosis not present

## 2023-08-24 DIAGNOSIS — G250 Essential tremor: Secondary | ICD-10-CM | POA: Diagnosis not present

## 2023-08-24 DIAGNOSIS — G500 Trigeminal neuralgia: Secondary | ICD-10-CM | POA: Diagnosis not present

## 2023-08-24 DIAGNOSIS — Z48815 Encounter for surgical aftercare following surgery on the digestive system: Secondary | ICD-10-CM | POA: Diagnosis not present

## 2023-08-24 DIAGNOSIS — G709 Myoneural disorder, unspecified: Secondary | ICD-10-CM | POA: Diagnosis not present

## 2023-08-24 DIAGNOSIS — D329 Benign neoplasm of meninges, unspecified: Secondary | ICD-10-CM | POA: Diagnosis not present

## 2023-08-24 DIAGNOSIS — I10 Essential (primary) hypertension: Secondary | ICD-10-CM | POA: Diagnosis not present

## 2023-08-27 DIAGNOSIS — M170 Bilateral primary osteoarthritis of knee: Secondary | ICD-10-CM | POA: Diagnosis not present

## 2023-08-28 DIAGNOSIS — R7303 Prediabetes: Secondary | ICD-10-CM | POA: Diagnosis not present

## 2023-08-28 DIAGNOSIS — R1013 Epigastric pain: Secondary | ICD-10-CM | POA: Diagnosis not present

## 2023-08-28 DIAGNOSIS — Z9049 Acquired absence of other specified parts of digestive tract: Secondary | ICD-10-CM | POA: Diagnosis not present

## 2023-08-29 DIAGNOSIS — Z4803 Encounter for change or removal of drains: Secondary | ICD-10-CM | POA: Diagnosis not present

## 2023-08-29 DIAGNOSIS — G250 Essential tremor: Secondary | ICD-10-CM | POA: Diagnosis not present

## 2023-08-29 DIAGNOSIS — M5136 Other intervertebral disc degeneration, lumbar region with discogenic back pain only: Secondary | ICD-10-CM | POA: Diagnosis not present

## 2023-08-29 DIAGNOSIS — D329 Benign neoplasm of meninges, unspecified: Secondary | ICD-10-CM | POA: Diagnosis not present

## 2023-08-29 DIAGNOSIS — Z9049 Acquired absence of other specified parts of digestive tract: Secondary | ICD-10-CM | POA: Diagnosis not present

## 2023-08-29 DIAGNOSIS — G500 Trigeminal neuralgia: Secondary | ICD-10-CM | POA: Diagnosis not present

## 2023-08-29 DIAGNOSIS — I10 Essential (primary) hypertension: Secondary | ICD-10-CM | POA: Diagnosis not present

## 2023-08-29 DIAGNOSIS — G709 Myoneural disorder, unspecified: Secondary | ICD-10-CM | POA: Diagnosis not present

## 2023-08-29 DIAGNOSIS — Z48815 Encounter for surgical aftercare following surgery on the digestive system: Secondary | ICD-10-CM | POA: Diagnosis not present

## 2023-08-31 DIAGNOSIS — I10 Essential (primary) hypertension: Secondary | ICD-10-CM | POA: Diagnosis not present

## 2023-08-31 DIAGNOSIS — Z9049 Acquired absence of other specified parts of digestive tract: Secondary | ICD-10-CM | POA: Diagnosis not present

## 2023-08-31 DIAGNOSIS — G500 Trigeminal neuralgia: Secondary | ICD-10-CM | POA: Diagnosis not present

## 2023-08-31 DIAGNOSIS — G250 Essential tremor: Secondary | ICD-10-CM | POA: Diagnosis not present

## 2023-08-31 DIAGNOSIS — G709 Myoneural disorder, unspecified: Secondary | ICD-10-CM | POA: Diagnosis not present

## 2023-08-31 DIAGNOSIS — Z48815 Encounter for surgical aftercare following surgery on the digestive system: Secondary | ICD-10-CM | POA: Diagnosis not present

## 2023-08-31 DIAGNOSIS — D329 Benign neoplasm of meninges, unspecified: Secondary | ICD-10-CM | POA: Diagnosis not present

## 2023-08-31 DIAGNOSIS — Z4803 Encounter for change or removal of drains: Secondary | ICD-10-CM | POA: Diagnosis not present

## 2023-08-31 DIAGNOSIS — M5136 Other intervertebral disc degeneration, lumbar region with discogenic back pain only: Secondary | ICD-10-CM | POA: Diagnosis not present

## 2023-09-03 DIAGNOSIS — M170 Bilateral primary osteoarthritis of knee: Secondary | ICD-10-CM | POA: Diagnosis not present

## 2023-09-10 ENCOUNTER — Ambulatory Visit (HOSPITAL_BASED_OUTPATIENT_CLINIC_OR_DEPARTMENT_OTHER): Admitting: Physical Therapy

## 2023-09-10 DIAGNOSIS — M170 Bilateral primary osteoarthritis of knee: Secondary | ICD-10-CM | POA: Diagnosis not present

## 2023-09-19 DIAGNOSIS — R251 Tremor, unspecified: Secondary | ICD-10-CM | POA: Diagnosis not present

## 2023-09-21 NOTE — Progress Notes (Deleted)
 NEUROLOGY FOLLOW UP OFFICE NOTE  Barbara [name] [id number]  Subjective:  Barbara [name] is a 73 y.o. year old right-handed female with a medical history of HTN, HLD, allergies, endometrial polyps, OA, osteoporosis, meningioma, GERD who we last saw on 06/13/23 for tremor.  To briefly review: Initial consult (12/22/21) for right leg pain: Patient was referred by [facility] (Dr. Debby). Per documentation provided, her symptoms became in fall of 2022 after starting a statin. Within 1 week she had bilateral leg pain with muscle spasms/cramps. She stopped the statin and the symptoms in her left leg resolved. The right leg pain continued however. She describes the pain as a stabbing pain that would go from her buttocks to her heel. It can radiate from the back as well. There is also burning pain in her heel. If she sits too long, she can hardly walk on her leg. She states the achilles tendon is still inflamed. She was found to have a Baker's cyst as well. Around 05/2021 the right leg pain worsened. She only takes tylenol  for pain because she is so sensitive to medications.   MRI lumbar spine wo contrast showed modest degenerative disease with L5-S1 spondylolisthesis without significant disc degeneration or stenosis per notes.    She did PT. Things go worse during PT because she couldn't straighten her leg. She had an injection in July 2023 which gave relief, but only for 2 days. She had another injection about 2 weeks ago that helped much more significantly. She does not know what was injected though. DVT scan of the leg was negative.    Patient also endorses neck pain and was scheduled for surgery in the past, but this was cancelled due to her breaking her left leg.   She has gained 15-20 over the last year. She denies wearing tight clothing.   Of note, patient was diagnosed with diffuse anterior bilateral nuclear sclerosis by Dr. Maree at [facility] neurology. She was  previously treated with steroids, which she did not tolerate well.   Patient also saw Dr. Onita at [facility] in 2018 for headaches and essential tremor.   Of note, she has a lot of medication allergies and prior reactions.   The patient has not noticed any recent skin rashes nor does she report any constitutional symptoms like fever, night sweats, anorexia or unintentional weight loss.   EtOH use: None  Restrictive diet? None Family history of neuropathy/myopathy/NM disease? No   01/31/23: B12 was borderline low, so I recommended B12 1000 mcg daily. EMG and NMUS of the RLE were normal. Patient continues to have right leg pain. She was even seen at [facility]. She was told she has a Baker's cyst, but local doctor does not see fluid on ultrasound. She got a steroid shot that helped very little. She is riding a stationary bike daily. The pain has improved with exercise.   Patient is here today to discuss tremor in both hands. It is worse in the right hand. This has been present since patient was in high school. It is now getting worse and affecting eating and drinking. She has been treated with propranolol  for at least 10 years. She is now on extended release propranolol  60 mg daily. She does not think it is helping much with tremor lately. It is also used for her BP. Per husband, he sometimes sees a tremor in her lips or eyebrows when she is tired. She can also notice a tremor in  her right foot when driving at times.   She can hold her hands and suppress the tremor. It is not present at rest. It is also affecting her writing.   She is not sure if she has difficulty with smell. She has difficulty with taste. She denies any REM sleep behavior problems, no pseudobulbar affect. She denies freezing, imbalance, or falls.   Her mother had tremors and her sister has tremors.  06/13/23: Regarding patient's tremor, she does not remember picking up the prescription and does not think she was taking it.  Patient is currently having her propranolol  weaned off. During this process she has noticed her tremor has improved. That includes the lip and eye brow quivering.    She has recently had pain and swelling in the left knee. She was having trouble walking. Her orthopedist has drained her knee and got a cortisone injection. She is still having pain in her right leg as previous. She is also noticing pain in her right arm, shoulder, and neck. She has had previous surgeries on her shoulders. She was also supposed to have surgery on neck for bone spurs many years ago.  Most recent Assessment and Plan (06/13/23): This is Barbara [name], a 73 y.o. female with: Tremor - consistent with essential tremor. Has been on propranolol  for many years. Is now weaning down with improvement of tremor. I prescribed primidone  to add to propranolol  in 01/2023, but patient does not remember getting it or taking it. We discussed and will monitor tremor. If treatment is needed, primidone  would be the next best option. Osteoarthritis - affecting right and left knees and legs and right neck, shoulder and arm. No evidence of neuropathy on EMG to explain pain    Plan: -Will hold off on primidone  as patient feels she is improving -F/u with ortho as planned -Fall precautions discussed Return to clinic as needed  Since their last visit: Patient had gallbladder surgery 08/18/23 and had worsening of tremor after. She has been restarted on propranolol .***  MEDICATIONS:  Outpatient Encounter Medications as of 09/26/2023  Medication Sig   amLODipine  (NORVASC ) 5 MG tablet Take 1 tablet (5 mg total) by mouth daily.   b complex vitamins tablet Take 1 tablet by mouth daily.   Cholecalciferol (VITAMIN D-3) 25 MCG (1000 UT) CAPS Take 1,000 Units by mouth daily.   loratadine  (CLARITIN ) 10 MG tablet Take 10 mg by mouth daily as needed for allergies.   Milk Thistle 250 MG CAPS Take 250 mg by mouth 2 (two) times daily.   Multiple  Vitamins-Minerals (ICAPS PO) Take 1 capsule by mouth 2 (two) times daily.   omeprazole  (PRILOSEC) 20 MG capsule Take 20 mg by mouth daily.   psyllium (HYDROCIL/METAMUCIL) 95 % PACK Take 1 packet by mouth 2 (two) times daily.   No facility-administered encounter medications on file as of 09/26/2023.    PAST MEDICAL HISTORY: 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, nickel  03/31/2017   Anxiety    Arthritis    knees   Cancer (HCC)    basal cell- nose   GERD (gastroesophageal reflux disease)    Hyperlipidemia    Hypertension    Meningioma (HCC)    Neuromuscular disorder (HCC)    benign tremor- takes Propanolol   PONV (postoperative nausea and vomiting)    BP DROPS ALSO   Trigeminal neuralgia of right side of face     PAST SURGICAL HISTORY: Past Surgical History:  Procedure Laterality Date  24 HOUR PH STUDY N/A 04/17/2016   Procedure: 24 HOUR PH STUDY;  Surgeon: Elspeth Deward Naval, MD;  Location: [facility];  Service: Gastroenterology;  Laterality: N/A;   CHOLECYSTECTOMY N/A 08/18/2023   Procedure: LAPAROSCOPIC CHOLECYSTECTOMY;  Surgeon: Tanda Locus, MD;  Location: [facility];  Service: General;  Laterality: N/A;   ESOPHAGEAL MANOMETRY N/A 04/17/2016   Procedure: ESOPHAGEAL MANOMETRY (EM);  Surgeon: Elspeth Deward Naval, MD;  Location: [facility];  Service: Gastroenterology;  Laterality: N/A;   ESOPHAGOGASTRODUODENOSCOPY N/A 08/16/2023   Procedure: EGD (ESOPHAGOGASTRODUODENOSCOPY);  Surgeon: Abran Norleen SAILOR, MD;  Location: [facility];  Service: Gastroenterology;  Laterality: N/A;   EXTERNAL FIXATION LEG Left 03/28/2017   [place]   EXTERNAL FIXATION LEG Left 03/28/2017   Procedure: EXTERNAL FIXATION LEG;  Surgeon: Beverley Evalene BIRCH, MD;  Location: [facility];  Service: Orthopedics;  Laterality: Left;   EXTERNAL FIXATION REMOVAL Left 05/08/2017   Procedure: REMOVAL EXTERNAL FIXATION LEFT  LEG WITH HARDWARE REMOVAL;  Surgeon: Beverley Evalene BIRCH, MD;  Location: [facility];  Service: Orthopedics;  Laterality: Left;   fractured femur  05/2012   left - rod -screws placed   HARDWARE REMOVAL Left 04/16/2013   Procedure: REMOVAL OF HARDWARE OF LEFT KNEE (DISTAL INTERLOC SCREW);  Surgeon: Dempsey LULLA Moan, MD;  Location: [facility];  Service: Orthopedics;  Laterality: Left;   INDOCYANINE GREEN  FLUORESCENCE IMAGING (ICG) N/A 08/18/2023   Procedure: INDOCYANINE GREEN  FLUORESCENCE IMAGING (ICG);  Surgeon: Tanda Locus, MD;  Location: [facility];  Service: General;  Laterality: N/A;   KNEE SURGERY     x 3 left / x1 rt knee   LEFT HEART CATH AND CORONARY ANGIOGRAPHY N/A 09/18/2016   Procedure: Left Heart Cath and Coronary Angiography;  Surgeon: Wonda Sharper, MD;  Location: [facility];  Service: Cardiovascular;  Laterality: N/A;   NASAL SINUS SURGERY     2000   OTHER SURGICAL HISTORY  2014   titanium rod in left femur used for fracture repair   SHOULDER SURGERY     bilateral shoulders    WISDOM TOOTH EXTRACTION      ALLERGIES: Allergies  Allergen Reactions   Ace Inhibitors Hives and Nausea Only    Nasal congestion   Azathioprine Other (See Comments)    cardiac issues   Celebrex [Celecoxib] Other (See Comments)    Severe stomach cramps   Crestor  [Rosuvastatin ] Other (See Comments)    Bilat leg/heel pains/swelling   Fish Oil Other (See Comments)    Broke out with blisters   Forteo [Parathyroid Hormone (Recomb)]     Unknown   Gabapentin      Pain and swelling    Lanolin Acid [Lanolin]     rash   Levofloxacin Other (See Comments)    sore ankles   Maxzide [Triamterene -Hctz] Other (See Comments)    Knee, ankles and back pain   Methadone Nausea And Vomiting    All narcotic pain killers   Methotrexate Derivatives Other (See Comments)    cardiac issues   Nickel     rash   Penicillins Hives    All penicillins   Prednisone  Other (See Comments)    High blood pressure.   Propoxyphene Hcl Nausea Only   Sulfonamide Derivatives Nausea  Only and Other (See Comments)    Stomach cramping   Synvisc [Hylan G-F 20]     Made knee swell up at injection site   Tramadol  Nausea And Vomiting    FAMILY HISTORY: Family History  Problem Relation  Age of Onset   Stomach cancer Mother    Heart disease Mother 67       First MI early 77s, CABG    Lung cancer Father        smoked   Allergies Father    Heart attack Father 39   Brain cancer Brother    Colon cancer Neg Hx    Esophageal cancer Neg Hx    Rectal cancer Neg Hx     SOCIAL HISTORY: Social History   Tobacco Use   Smoking status: Never   Smokeless tobacco: Never  Vaping Use   Vaping status: Never Used  Substance Use Topics   Alcohol use: Yes    Comment: RARE   Drug use: No   Social History   Social History Narrative   Lives at home at home with husband. In a one story home   Right-handed.   No caffeine use.   What is your current occupation? retired          Objective:  Vital Signs:  There were no vitals taken for this visit.  ***  Labs and Imaging review: New results: 08/19/23: CBC unremarkable CMP significant for glucose 134, AST 124, ALT 132, tbili 1.7  Previously reviewed results: 12/22/21: B12: 314 B6 wnl   05/19/22: BMP unremarkable CBC unremarkable   CK (06/25/20): 65 (24 five years prior) ESR (06/25/20): 2 Vit D (04/20/20): 32.7 TSH (04/20/20): 1.40     MRI lumbar spine wo contrast (external, 09/22/21): Modest degenerative disease with L5-S1 spondylolisthesis without significant disc degeneration or stenosis per notes. I personally reviewed MRI and agree that there is no significant stenosis.   MRI brain w/wo contrast (09/16/16): FINDINGS: Brain: Cerebral volume within normal limits for age. Few scattered subcentimeter foci of T2/FLAIR hyperintensity present within the periventricular white matter of both cerebral hemispheres, nonspecific, but mild for age. No other focal parenchymal signal abnormality identified. No abnormal foci of  restricted diffusion to suggest acute or subacute ischemia. Gray-white matter differentiation well maintained. No areas of encephalomalacia to suggest chronic infarction. No evidence for acute or chronic intracranial hemorrhage.   Small solidly enhancing mass measuring 10 x 8 x 7 mm straddling the anterior falx is most consistent with a small meningioma (series 14, image 40). No associated mass effect. No other mass lesion, mass effect, or midline shift. Ventricles normal size without evidence for hydrocephalus. No extra-axial fluid collection. Major dural sinuses are patent.   No abnormal enhancement. Specifically, no abnormal enhancement seen involving or along the course of either trigeminal nerve. Meckel's caves are symmetric and within normal limits bilaterally. No findings to suggest vascular compression.   Pituitary gland suprasellar region within normal limits. Midline structures intact and normal.   Vascular: Major intracranial vascular flow voids are well maintained and normal.   Skull and upper cervical spine: Craniocervical junction within normal limits. Visualized upper cervical spine unremarkable. Bone marrow signal intensity within normal limits. No scalp soft tissue abnormality.   Sinuses/Orbits: Globes and orbital soft tissues within normal limits. Paranasal sinuses are clear. No mastoid effusion. Inner ear structures normal.   IMPRESSION: 1. Mild nonspecific cerebral white matter changes for age. 2. Small 10 x 8 x 7 mm meningioma straddling the anterior falx without significant mass effect. 3. Otherwise unremarkable and normal brain MRI. No structural findings to explain patient's symptoms identified.   MRI cervical spine (03/10/12): IMPRESSION:   1.  Small disc protrusion and spurs extending into the right neural  foramen at C7-T1  which could impinge upon the right C8 nerve.  2. Moderate left and slight right foraminal stenosis at C6-7.  3.  Moderate  to severe facet arthritis at C2-3 through C4-5 on the  right.    EMG (01/31/22): NCV & EMG Findings: Extensive electrodiagnostic evaluation of the right lower limb shows: Right sural and superficial fibular/peroneal sensory responses are within normal limits. Right fibular/peroneal (EDB) and tibial (AH) motor responses are within normal limits. Right H reflex is absent. There is no evidence of active or chronic motor axon loss changes affecting any of the tested muscles. Motor unit configuration and recruitment pattern is within normal limits.   Impression: This is a normal study of the right lower extremity. In particular, there is no electrodiagnostic evidence of a right lumbosacral (L3-S1) motor radiculopathy or large fiber sensorimotor neuropathy.   NMUS (01/31/22): Findings: High frequency (4.0-16.0 MHz) B-mode, nonvascular ultrasound of the right lower limb shows: Cross section areas (CSA) of the right sciatic nerve (popliteal fossa), peroneal nerve (popliteal fossa to fibular head), and tibial nerve (popliteal fossa) are within normal limits.   Impression: This is a normal neuromuscular ultrasound of the right lower limb. Specifically, there is no ultrasonographic evidence of entrapment or other focal pathology along the studied course of the right sciatic nerve (popliteal fossa), peroneal nerve (popliteal fossa to fibular head), or tibial nerve (popliteal fossa).   No other obvious lesion involving the adjacent bone or tendon is identified. No definite vascular abnormalities.  Assessment/Plan:  This is Barbara [name], a 73 y.o. female with: ***   Plan: ***  Return to clinic in ***  Total time spent reviewing records, interview, history/exam, documentation, and coordination of care on day of encounter:  *** min  Venetia Potters, MD

## 2023-09-26 ENCOUNTER — Ambulatory Visit: Admitting: Neurology

## 2023-09-26 DIAGNOSIS — R7303 Prediabetes: Secondary | ICD-10-CM | POA: Diagnosis not present

## 2023-10-03 DIAGNOSIS — H5789 Other specified disorders of eye and adnexa: Secondary | ICD-10-CM | POA: Diagnosis not present

## 2023-10-03 DIAGNOSIS — M2540 Effusion, unspecified joint: Secondary | ICD-10-CM | POA: Diagnosis not present

## 2023-10-03 DIAGNOSIS — E669 Obesity, unspecified: Secondary | ICD-10-CM | POA: Diagnosis not present

## 2023-10-03 DIAGNOSIS — M79605 Pain in left leg: Secondary | ICD-10-CM | POA: Diagnosis not present

## 2023-10-03 DIAGNOSIS — Z6830 Body mass index (BMI) 30.0-30.9, adult: Secondary | ICD-10-CM | POA: Diagnosis not present

## 2023-10-03 DIAGNOSIS — M79604 Pain in right leg: Secondary | ICD-10-CM | POA: Diagnosis not present

## 2023-10-03 DIAGNOSIS — R768 Other specified abnormal immunological findings in serum: Secondary | ICD-10-CM | POA: Diagnosis not present

## 2023-10-15 DIAGNOSIS — M542 Cervicalgia: Secondary | ICD-10-CM | POA: Diagnosis not present

## 2023-10-15 DIAGNOSIS — M25562 Pain in left knee: Secondary | ICD-10-CM | POA: Diagnosis not present

## 2023-10-26 DIAGNOSIS — M25562 Pain in left knee: Secondary | ICD-10-CM | POA: Diagnosis not present

## 2023-11-01 DIAGNOSIS — U071 COVID-19: Secondary | ICD-10-CM | POA: Diagnosis not present

## 2023-11-01 DIAGNOSIS — R509 Fever, unspecified: Secondary | ICD-10-CM | POA: Diagnosis not present

## 2023-11-01 DIAGNOSIS — J029 Acute pharyngitis, unspecified: Secondary | ICD-10-CM | POA: Diagnosis not present

## 2023-11-09 ENCOUNTER — Encounter: Admitting: Dietician

## 2023-11-15 ENCOUNTER — Encounter: Payer: Self-pay | Admitting: Dietician

## 2023-11-15 ENCOUNTER — Encounter: Attending: Family Medicine | Admitting: Dietician

## 2023-11-15 DIAGNOSIS — R7303 Prediabetes: Secondary | ICD-10-CM | POA: Insufficient documentation

## 2023-11-15 NOTE — Progress Notes (Signed)
 Patient was seen on 11/15/23 for the Core Session 1 of Diabetes Prevention Program course at [facility]. The following learning objectives were met by the patient during this class:   Learning Objectives:  Be able to explain the purpose and benefits of the National Diabetes Prevention Program.  Be able to describe the events that will take place at every session.  Know the weight loss and physical activity goals established by the [facility] Diabetes Prevention Program.  Know their own individual weight loss and physical activity goals.  Be able to explain the important effect of self-monitoring on behavior change.   Goals:  Record food and beverage intake in Food and Activity Tracker over the next week.  Bring completed Food and Activity Tracker for session 1 to session 2 next week. Circle the foods or beverages you think are highest in fat and calories in your food tracker. Read the labels on the food you buy, and consider using measuring cups and spoons to help you calculate the amount you eat. We will talk about measuring in more detail in the coming weeks.   Follow-Up Plan: Attend Core Session 2 next week.  Bring completed Food and Activity Tracker next week to be reviewed by Lifestyle Coach.

## 2023-11-16 ENCOUNTER — Encounter: Payer: Self-pay | Admitting: Dietician

## 2023-11-16 ENCOUNTER — Encounter: Attending: Family Medicine | Admitting: Dietician

## 2023-11-16 DIAGNOSIS — R7303 Prediabetes: Secondary | ICD-10-CM | POA: Insufficient documentation

## 2023-11-16 NOTE — Progress Notes (Signed)
 Patient was seen on 11/16/23 for the Core Session 2 of Diabetes Prevention Program course at [facility]. By the end of this session patients are able to complete the following objectives:   Learning Objectives: Self-monitor their weight during the weeks following Session 2.  Describe the relationship between fat and calories.  Explain the reason for, and basic principles of, self-monitoring fat grams and calories.  Identify their personal fat gram goals.  Use the ?Fat and Calorie Counter? to calculate the calories and fat grams of a given selection of foods.  Keep a running total of the fat grams they eat each day.  Calculate fat, calories, and serving sizes from nutrition labels.   Goals:  Weigh yourself at the same time each day, or every few days, and record your weight in your Food and Activity Tracker. Write down everything you eat and drink in your Food and Activity Tracker. Measure portions as much as you can, and start reading labels.  Use the ?Fat and Calorie Counter? to figure out the amount of fat and calories in what you ate, and write the amount down in your Food and Activity Tracker. Keep a running fat gram total throughout the day. Come as close to your fat gram goal as you can.   Follow-Up Plan: Attend Core Session 3 next week.  Bring completed Food and Activity Tracker next week to be reviewed by Lifestyle Coach.

## 2023-11-23 ENCOUNTER — Encounter: Attending: Family Medicine | Admitting: Dietician

## 2023-11-23 ENCOUNTER — Encounter: Payer: Self-pay | Admitting: Dietician

## 2023-11-23 DIAGNOSIS — R7303 Prediabetes: Secondary | ICD-10-CM | POA: Insufficient documentation

## 2023-11-23 NOTE — Progress Notes (Signed)
 Patient was seen on 11/23/23 for the Core Session 3 of Diabetes Prevention Program course at [facility]. By the end of this session patients are able to complete the following objectives:   Learning Objectives: Weigh and measure foods. Estimate the fat and calorie content of common foods. Describe three ways to eat less fat and fewer calories. Create a plan to eat less fat for the following week.   Goals:  Track weight when weighing outside of class.  Track food and beverages eaten each day in Food and Activity Tracker and include fat grams and calories for each.  Try to stay within fat gram goal.  Complete plan for eating less high fat foods and answer related homework questions.    Follow-Up Plan: Attend Core Session 4 next week.  Bring completed Food and Activity Tracker next week to be reviewed by Lifestyle Coach.

## 2023-11-30 ENCOUNTER — Encounter: Payer: Self-pay | Admitting: Dietician

## 2023-11-30 ENCOUNTER — Encounter (HOSPITAL_BASED_OUTPATIENT_CLINIC_OR_DEPARTMENT_OTHER): Admitting: Dietician

## 2023-11-30 DIAGNOSIS — R7303 Prediabetes: Secondary | ICD-10-CM | POA: Diagnosis not present

## 2023-11-30 NOTE — Progress Notes (Signed)
 Patient was seen on 11/30/23 for the Core Session 4 of Diabetes Prevention Program course at [facility]. By the end of this session patients are able to complete the following objectives:   Learning Objectives: Explain the health benefits of eating less fat and fewer calories. Describe the MyPlate food guide and its recommendations, including how to reduce fat and calories in our diet. Compare and contrast MyPlate guidelines with participants' eating habits. List ways to replace high-fat and high-calorie foods with low-fat and low-calorie foods. Explain the importance of eating plenty of whole grains, vegetables, and fruits, while staying within fat gram goals. Explain the importance of eating foods from all groups of MyPlate and of eating a variety of foods from within each group. Explain why a balanced diet is beneficial to health. Explain why eating the same foods over and over is not the best strategy for long-term success.   Goals:  Record weight taken outside of class.  Track foods and beverages eaten each day in the Food and Activity Tracker, including calories and fat grams for each item.  Practice comparing what you eat with the recommendations of MyPlate using the Rate Your Plate handout.  Complete the Rate Your Plate handout form on at least 3 days.  Answer homework questions.   Follow-Up Plan: Attend Core Session 5 next week.  Bring completed Food and Activity Tracker next week to be reviewed by Lifestyle Coach.

## 2023-12-07 ENCOUNTER — Encounter: Attending: Family Medicine | Admitting: Dietician

## 2023-12-07 ENCOUNTER — Encounter: Payer: Self-pay | Admitting: Dietician

## 2023-12-07 DIAGNOSIS — R7303 Prediabetes: Secondary | ICD-10-CM | POA: Diagnosis not present

## 2023-12-07 NOTE — Progress Notes (Addendum)
 Start time: 1400 End time: 1500 Class size: 4  Patient was seen on 12/07/23 for the Core Session 5 of Diabetes Prevention Program course at [facility]. By the end of this session patients are able to complete the following objectives:   Learning Objectives: Establish a physical activity goal. Explain the importance of the physical activity goal. Describe their current level of physical activity. Name ways that they are already physically active. Develop personal plans for physical activity for the next week.   Goals:  Record weight taken outside of class.  Track foods and beverages eaten each day in the Food and Activity Tracker, including calories and fat grams for each item.  Make an Activity Plan including date, specific type of activity, and length of time you plan to be active that includes at last 60 minutes of activity for the week.  Track activity type, minutes you were active, and distance you reached each day in the Food and Activity Tracker.   Follow-Up Plan: Attend Core Session 6 next week.  Bring completed Food and Activity Tracker next week to be reviewed by Lifestyle Coach.

## 2023-12-18 ENCOUNTER — Encounter: Payer: Self-pay | Admitting: Dietician

## 2023-12-18 ENCOUNTER — Encounter: Attending: Family Medicine | Admitting: Dietician

## 2023-12-18 DIAGNOSIS — R7303 Prediabetes: Secondary | ICD-10-CM | POA: Insufficient documentation

## 2023-12-18 NOTE — Progress Notes (Signed)
 Patient was seen on 12/18/23 for the Core Session 6 of Diabetes Prevention Program course at [facility]. By the end of this session patients are able to complete the following objectives:   Learning Objectives: Graph their daily physical activity.  Describe two ways of finding the time to be active.  Define "lifestyle activity."  Describe how to prevent injury.  Develop an activity plan for the coming week.   Goals:  Record weight taken outside of class.  Track foods and beverages eaten each day in the Food and Activity Tracker, including calories and fat grams for each item.   Track activity type, minutes you were active, and distance you reached each day in the Food and Activity Tracker.  Set aside one 20 to 30-minute block of time every day or find two or more periods of 10 to15 minutes each for physical activity.  Warm up, cool down, and stretch. Make a Physical Activities Plan for the Week.   Follow-Up Plan: Attend Core Session 7 next week.  Bring completed Food and Activity Tracker next week to be reviewed by Lifestyle Coach.

## 2023-12-21 ENCOUNTER — Encounter: Admitting: Dietician

## 2023-12-25 ENCOUNTER — Ambulatory Visit (HOSPITAL_BASED_OUTPATIENT_CLINIC_OR_DEPARTMENT_OTHER): Attending: Orthopedic Surgery | Admitting: Physical Therapy

## 2023-12-25 DIAGNOSIS — M6281 Muscle weakness (generalized): Secondary | ICD-10-CM | POA: Insufficient documentation

## 2023-12-25 DIAGNOSIS — M25561 Pain in right knee: Secondary | ICD-10-CM | POA: Insufficient documentation

## 2023-12-25 DIAGNOSIS — M25562 Pain in left knee: Secondary | ICD-10-CM | POA: Insufficient documentation

## 2023-12-25 DIAGNOSIS — G8929 Other chronic pain: Secondary | ICD-10-CM | POA: Insufficient documentation

## 2023-12-25 DIAGNOSIS — R262 Difficulty in walking, not elsewhere classified: Secondary | ICD-10-CM | POA: Insufficient documentation

## 2023-12-25 DIAGNOSIS — M25661 Stiffness of right knee, not elsewhere classified: Secondary | ICD-10-CM | POA: Insufficient documentation

## 2023-12-25 DIAGNOSIS — M25662 Stiffness of left knee, not elsewhere classified: Secondary | ICD-10-CM | POA: Insufficient documentation

## 2023-12-26 NOTE — Therapy (Signed)
 [facility] at [facility] [address] [place], [place], [address] Phone: [phone]   Fax:  [phone]  Patient Details  Name: Barbara [name] MRN: [id number] Date of Birth: 26-Oct-1950 Referring Provider:  Beverley Evalene BIRCH, MD  Encounter Date: 12/25/2023  Pt reports she is here for her L knee.  PT explained to pt that the only PT orders we have or for her shoulder pain which is from April.  PT explained to pt that we cannot evaluate her LE without PT orders.  Pt is going to call MD to get PT orders.  Leigh Minerva III PT, DPT 12/26/23 8:36 PM   [facility] at [facility] [address] [place], [place], [address] Phone: [phone]   Fax:  [phone]

## 2023-12-28 ENCOUNTER — Encounter: Attending: Family Medicine | Admitting: Dietician

## 2023-12-28 ENCOUNTER — Encounter: Payer: Self-pay | Admitting: Dietician

## 2023-12-28 DIAGNOSIS — R7303 Prediabetes: Secondary | ICD-10-CM | POA: Insufficient documentation

## 2023-12-28 NOTE — Progress Notes (Signed)
 Patient was seen on 12/28/23 for the Core Session 7 of Diabetes Prevention Program course at [facility]. By the end of this session patients are able to complete the following objectives:   Learning Objectives: Define calorie balance. Explain how healthy eating and being active are related in terms of calorie balance.  Describe the relationship between calorie balance and weight loss.  Describe his or her progress as it relates to calorie balance.  Develop an activity plan for the coming week.   Goals:  Record weight taken outside of class.  Track foods and beverages eaten each day in the Food and Activity Tracker, including calories and fat grams for each item.   Track activity type, minutes you were active, and distance you reached each day in the Food and Activity Tracker.  Set aside one 20 to 30-minute block of time every day or find two or more periods of 10 to15 minutes each for physical activity.  Make a Physical Activities Plan for the Week.  Make active lifestyle choices all through the day  Stay at or go slightly over activity goal.   Follow-Up Plan: Attend Core Session 8 next week.  Bring completed Food and Activity Tracker next week to be reviewed by Lifestyle Coach.

## 2024-01-01 ENCOUNTER — Encounter (HOSPITAL_BASED_OUTPATIENT_CLINIC_OR_DEPARTMENT_OTHER): Payer: Self-pay | Admitting: Physical Therapy

## 2024-01-01 ENCOUNTER — Other Ambulatory Visit: Payer: Self-pay

## 2024-01-01 ENCOUNTER — Ambulatory Visit (HOSPITAL_BASED_OUTPATIENT_CLINIC_OR_DEPARTMENT_OTHER): Admitting: Physical Therapy

## 2024-01-01 DIAGNOSIS — M25662 Stiffness of left knee, not elsewhere classified: Secondary | ICD-10-CM | POA: Diagnosis present

## 2024-01-01 DIAGNOSIS — R262 Difficulty in walking, not elsewhere classified: Secondary | ICD-10-CM | POA: Diagnosis present

## 2024-01-01 DIAGNOSIS — M6281 Muscle weakness (generalized): Secondary | ICD-10-CM

## 2024-01-01 DIAGNOSIS — G8929 Other chronic pain: Secondary | ICD-10-CM | POA: Diagnosis present

## 2024-01-01 DIAGNOSIS — M25661 Stiffness of right knee, not elsewhere classified: Secondary | ICD-10-CM | POA: Diagnosis present

## 2024-01-01 DIAGNOSIS — M25561 Pain in right knee: Secondary | ICD-10-CM | POA: Diagnosis present

## 2024-01-01 DIAGNOSIS — M25562 Pain in left knee: Secondary | ICD-10-CM | POA: Diagnosis present

## 2024-01-01 NOTE — Therapy (Signed)
 OUTPATIENT PHYSICAL THERAPY LOWER EXTREMITY EVALUATION   Patient Name: Barbara [name] MRN: [id number] DOB:Jun 23, 1950, 73 y.o., female Today's Date: 01/02/2024  END OF SESSION:  PT End of Session - 01/01/24 1027     Visit Number 1    Number of Visits 12    Date for Recertification  02/12/24    Authorization Type [employer] M[employer]    PT Start Time 1022    PT Stop Time 1112    PT Time Calculation (min) 50 min    Activity Tolerance Patient tolerated treatment well    Behavior During Therapy [facility] for tasks assessed/performed          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, nickel  03/31/2017   Anxiety    Arthritis    knees   Cancer (HCC)    basal cell- nose   GERD (gastroesophageal reflux disease)    Hyperlipidemia    Hypertension    Meningioma (HCC)    Neuromuscular disorder (HCC)    benign tremor- takes Propanolol   PONV (postoperative nausea and vomiting)    BP DROPS ALSO   Trigeminal neuralgia of right side of face    Past Surgical History:  Procedure Laterality Date   39 HOUR PH STUDY N/A 04/17/2016   Procedure: 24 HOUR PH STUDY;  Surgeon: Elspeth Deward Naval, MD;  Location: [facility];  Service: Gastroenterology;  Laterality: N/A;   CHOLECYSTECTOMY N/A 08/18/2023   Procedure: LAPAROSCOPIC CHOLECYSTECTOMY;  Surgeon: Tanda Locus, MD;  Location: [facility];  Service: General;  Laterality: N/A;   ESOPHAGEAL MANOMETRY N/A 04/17/2016   Procedure: ESOPHAGEAL MANOMETRY (EM);  Surgeon: Elspeth Deward Naval, MD;  Location: [facility];  Service: Gastroenterology;  Laterality: N/A;   ESOPHAGOGASTRODUODENOSCOPY N/A 08/16/2023   Procedure: EGD (ESOPHAGOGASTRODUODENOSCOPY);  Surgeon: Abran Norleen SAILOR, MD;  Location: [facility];  Service: Gastroenterology;  Laterality: N/A;   EXTERNAL FIXATION LEG Left 03/28/2017   Tibia   EXTERNAL FIXATION LEG Left 03/28/2017   Procedure: EXTERNAL FIXATION LEG;  Surgeon: Beverley Evalene BIRCH, MD;  Location: [facility];  Service:  Orthopedics;  Laterality: Left;   EXTERNAL FIXATION REMOVAL Left 05/08/2017   Procedure: REMOVAL EXTERNAL FIXATION LEFT  LEG WITH HARDWARE REMOVAL;  Surgeon: Beverley Evalene BIRCH, MD;  Location: [facility];  Service: Orthopedics;  Laterality: Left;   fractured femur  05/2012   left - rod -screws placed   HARDWARE REMOVAL Left 04/16/2013   Procedure: REMOVAL OF HARDWARE OF LEFT KNEE (DISTAL INTERLOC SCREW);  Surgeon: Dempsey LULLA Moan, MD;  Location: [facility];  Service: Orthopedics;  Laterality: Left;   INDOCYANINE GREEN  FLUORESCENCE IMAGING (ICG) N/A 08/18/2023   Procedure: INDOCYANINE GREEN  FLUORESCENCE IMAGING (ICG);  Surgeon: Tanda Locus, MD;  Location: [facility];  Service: General;  Laterality: N/A;   KNEE SURGERY     x 3 left / x1 rt knee   LEFT HEART CATH AND CORONARY ANGIOGRAPHY N/A 09/18/2016   Procedure: Left Heart Cath and Coronary Angiography;  Surgeon: Wonda Sharper, MD;  Location: [facility];  Service: Cardiovascular;  Laterality: N/A;   NASAL SINUS SURGERY     2000   OTHER SURGICAL HISTORY  2014   titanium rod in left femur used for fracture repair   SHOULDER SURGERY     bilateral shoulders    WISDOM TOOTH EXTRACTION     Patient Active Problem List   Diagnosis Date Noted   Abdominal pain 08/16/2023   Calculus of gallbladder without cholecystitis without  obstruction 08/16/2023   Recurrent biliary colic 08/16/2023   Hiatal hernia 08/16/2023   Degenerative arthritis of right knee 09/28/2022   Leg pain 08/08/2022   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, nickel  03/31/2017   Closed left tibial fracture 03/28/2017   Closed fracture of left tibial plateau 03/28/2017   Nonintractable headache 01/25/2017   Visual field defect    Chest pain 09/16/2016   Essential tremor 09/16/2016   Right facial numbness 09/16/2016   Bilateral scleritis 08/29/2016   Obesity 06/01/2016   Dyspnea on exertion 05/13/2016   Hoarseness 03/17/2016   Gastroesophageal reflux disease without esophagitis  03/17/2016   Globus sensation 03/17/2016   Gastritis 02/25/2016   Atypical chest pain 02/24/2016   Painful orthopaedic hardware 04/15/2013   Vitamin D deficiency 12/09/2009   HYPERCHOLESTEROLEMIA 12/09/2009   Essential hypertension 12/09/2009   Allergic rhinitis 12/09/2009   NEPHROLITHIASIS 12/09/2009   CERVICAL POLYP 12/09/2009   Osteoarthritis 12/09/2009   Cough variant asthma vs UACS  12/09/2009     REFERRING PROVIDER: Margeret Kotyk, MD   REFERRING DIAG: M79.606 (ICD-10-CM) - Generalized pain of knee region   THERAPY DIAG:  Chronic pain of left knee  Muscle weakness (generalized)  Chronic pain of right knee  Stiffness of right knee, not elsewhere classified  Stiffness of left knee, not elsewhere classified  Difficulty in walking, not elsewhere classified  Rationale for Evaluation and Treatment: Rehabilitation  ONSET DATE: chronic sx's ; PT order 12/25/2023  SUBJECTIVE:   SUBJECTIVE STATEMENT: Pt received land based PT in 2023 and aquatic therapy in 2024 for lumbago and LE pain.  In 2023, she continued to have significant deficits in R knee extension ROM and pain.  Pt states aquatic therapy helped.  Pt tried performing pool exercises though had to perform in the cooler water due difficulty using the warm water pool.  Pt states she didn't do well in the pool with colder water.  Pt still does some of her land based HEP.  Pt states her L knee began hurting worse.  She had her L knee drained multiple times.  Pt had a nerve block by Dr. Ibazebo in her knee which provided relief for 2 weeks in May approximately.  MD recommended a nerve burn which was scheudled.  Pt states insurance denied it and wanted her to try PT.    Pt has been wearing a customized DonJoy brace on L knee for about one month.  Pt states the brace gives her more confidence.  Pt walks her long driveway typically 2x/day with brace.  Pt has difficulty with walking after sitting awhile.  Standing for 2-3  minutes increases pain to 7-8/10.  She uses a stool in the kitchen to cook and prepare food.  She has difficulty with household chores.  Pt is limited with ambulation distance.  She uses a walker to go to the B/R in the middle of night.  PERTINENT HISTORY: PMHx:  Hx of L femur and tibia fractures with multiple surgeries including hardware removal (last surgery in 2019); R knee arthroscopic surgery in the 90's ; Allergy to nickel and autoimmune disorder, osteoporosis though Pt states it has improved and may be osteopenia ; OA in knees, cervical pain, , bilat shoulder surgeries,  R achilles pain due to a reaction from statins, meningioma, and hx trigeminal neuralgia   Gallbladder removal 06/25  PAIN:  L knee:  1.5/10 current, 7-8/10 worst, 1-1.5/10 best R knee:  0-1/10 at rest, 4-6/10 worst  PRECAUTIONS: Other: Hx of L LE fractures/surgeries,  allergy to nickel, osteoporosis   WEIGHT BEARING RESTRICTIONS: No  FALLS:  Has patient fallen in last 6 months? No  LIVING ENVIRONMENT: Lives with: lives with their spouse Lives in: House/apartment Stairs: 2 STE in garage, 4 STE B rails; no steps inside home     PLOF: Independent  PATIENT GOALS: to be able to stand without pain.  Improved walking after sitting   OBJECTIVE:  Note: Objective measures were completed at Evaluation unless otherwise noted.  DIAGNOSTIC FINDINGS:   PATIENT SURVEYS:  LEFS:  22/80  COGNITION: Overall cognitive status: Within functional limits for tasks assessed     EDEMA:  Pt wearing a customized DonJoy brace on L knee.    LOWER EXTREMITY ROM:  AROM/PROM Right eval Left eval  Hip flexion    Hip extension    Hip abduction    Hip adduction    Hip internal rotation    Hip external rotation    Knee flexion 115 121  Knee extension 20/17 5/3  Ankle dorsiflexion    Ankle plantarflexion    Ankle inversion    Ankle eversion     (Blank rows = not tested)  LOWER EXTREMITY MMT:  MMT Right eval  Left eval  Hip flexion 4/5 4+/5  Hip extension    Hip abduction 4-5/ with pain Unable to tolerate resistance, painful  Hip adduction    Hip internal rotation    Hip external rotation 5/5 4-/5  Knee flexion 5/5 seated 5/5 seated  Knee extension 4/5 4-/5  Ankle dorsiflexion    Ankle plantarflexion    Ankle inversion    Ankle eversion     (Blank rows = not tested)    FUNCTIONAL TESTS:  5x STS test:  19.30 sec without UE's  GAIT: Comments:  Pt ambulates without an AD with DonJoy brace on L knee.  She has a very slow gait speed.  Increased knee IR on L and decreased flexion and extension t/o gaity cycle.                                                                                                                                 TREATMENT:     PATIENT EDUCATION:  Education details: dx, objective findings, relevant anatomy, POC, rationale of interventions, and what to expect next treatment.  PT answered pt's questions.   Person educated: Patient Education method: Explanation Education comprehension: verbalized understanding and needs further education  HOME EXERCISE PROGRAM: Will give next visit  ASSESSMENT:  CLINICAL IMPRESSION: Patient is a 73 y.o. female with a dx of generalized pain in knee region.  Pt has chronic bilat knee pain.  She received land based and aquatic therapy in the past when her R knee was bothering her more.  She states her L knee has been hurting worse now.  She has a hx of L femur and tibia fractures and has had surgery including hardware removal.  Her last L LE  surgery was in 2019.   She has significant deficits in R knee extension ROM and minimal deficits in L knee extension ROM.  Pt has gait deficits and is limited with ambulation distance.  Pt is very limited in standing due to pain.  She has difficulty with household chores including cooking having to use a stool for seated multiple seated rest breaks.  Pt has weakness in bilat LE's, L > R.  She should  benefit from skilled PT to address impairments and improve overall function.        OBJECTIVE IMPAIRMENTS: Abnormal gait, decreased activity tolerance, decreased endurance, decreased mobility, difficulty walking, decreased ROM, decreased strength, hypomobility, impaired flexibility, and pain.   ACTIVITY LIMITATIONS: standing, squatting, stairs, transfers, and locomotion level  PARTICIPATION LIMITATIONS: meal prep, cleaning, laundry, shopping, and community activity  PERSONAL FACTORS: Fitness, Time since onset of injury/illness/exacerbation, and 3+ comorbidities: Hx of L femur and tibia fractures with multiple surgeries, autoimmune disorder, osteoporosis though Pt states it has improved and may be osteopenia ; OA in knees, R achilles pain are also affecting patient's functional outcome.   REHAB POTENTIAL: Fair prior responses to PT, chronic condition  CLINICAL DECISION MAKING: Evolving/moderate complexity  EVALUATION COMPLEXITY: Moderate   GOALS:   SHORT TERM GOALS: Target date:  01/22/2024 Pt will be independent and compliant with HEP for improved pain, strength, ROM, and function.  Baseline: Goal status: INITIAL  2.  Pt will demo improved L knee extension AROM to 0 deg for improved gait and stiffness.  Baseline:  Goal status: INITIAL  3.  Pt will be able to stand for 7-8 mins with < 6/10 pain. Baseline:  Goal status: INITIAL  4.  Pt will report at least a 25% improvement in pain and sx's overall.   Baseline:  Goal status: INITIAL   LONG TERM GOALS: Target date: 02/12/2024   Pt will report she is able to stand for longer duration to perform household chores with decreased seated rest breaks.  Baseline:  Goal status: INITIAL  2.  Pt will be able to stand for the majority of time while she is cooking without significant increased pain. Baseline:  Goal status: INITIAL  3.  Pt will be able to perform 5x STS test in < than 14 sec for improved functional LE strength and  performance of transfers.  Baseline:  Goal status: INITIAL  4.  Pt will report at least a 70% improvement with her daily ambulation.   Baseline:  Goal status: INITIAL  5.  Pt will report she is able to stand 15 mins without significant increased pain.  Baseline:  Goal status: INITIAL  6.  Pt will demo improved LE strength to 5/5 in bilat  knee extension and hip flexion and 4/5 in L hip abduction for improved performance of and tolerance with functional mobility.  Baseline:  Goal status: INITIAL   PLAN:  PT FREQUENCY: 2x/week  PT DURATION: 6 weeks  PLANNED INTERVENTIONS: 97164- PT Re-evaluation, 97750- Physical Performance Testing, 97110-Therapeutic exercises, 97530- Therapeutic activity, [id number]- Neuromuscular re-education, 97535- Self Care, [id number]- Manual therapy, [id number]- Gait training, [phone]- Aquatic Therapy, [phone]- Electrical stimulation (unattended), 20560 (1-2 muscles), 20561 (3+ muscles)- Dry Needling, Patient/Family education, Balance training, Stair training, Taping, Cryotherapy, and Moist heat  PLAN FOR NEXT SESSION:  Review prior land based HEP.  LE strengthening, knee ROM, and gait and mobility training.  Pt has a nickel allergy.   Leigh Minerva III PT, DPT 01/02/24 11:59 PM   Referring diagnosis? M79.606 (ICD-10-CM) -  Generalized pain of knee region    Treatment diagnosis? (if different than referring diagnosis)  Chronic pain of left knee  Muscle weakness (generalized)  Chronic pain of right knee  Stiffness of right knee, not elsewhere classified  Stiffness of left knee, not elsewhere classified  Difficulty in walking, not elsewhere classified  What was this (referring dx) caused by? [x]  Surgery []  Fall [x]  Ongoing issue []  Arthritis []  Other: ____________  Laterality: []  Rt []  Lt [x]  Both  Check all possible CPT codes:  *CHOOSE 10 OR LESS*    See Planned Interventions listed in the Plan section of the Evaluation.

## 2024-01-04 ENCOUNTER — Encounter: Attending: Family Medicine | Admitting: Dietician

## 2024-01-04 ENCOUNTER — Encounter: Payer: Self-pay | Admitting: Dietician

## 2024-01-04 DIAGNOSIS — R7303 Prediabetes: Secondary | ICD-10-CM | POA: Diagnosis not present

## 2024-01-04 NOTE — Progress Notes (Addendum)
 Start time: 1400 End time: 1500 Class size: 4  Patient was seen on 01/04/24 for the Core Session 8 of Diabetes Prevention Program course at [facility]. By the end of this session patients are able to complete the following objectives:   Learning Objectives: Recognize positive and negative food and activity cues.  Change negative food and activity cues to positive cues.  Add positive cues for activity and eliminate cues for inactivity.  Develop a plan for removing one problem food cue for the coming week.   Goals:  Record weight taken outside of class.  Track foods and beverages eaten each day in the Food and Activity Tracker, including calories and fat grams for each item.   Track activity type, minutes you were active, and distance you reached each day in the Food and Activity Tracker.  Set aside one 20 to 30-minute block of time every day or find two or more periods of 10 to15 minutes each for physical activity.  Remove one problem food cue.  Add one positive cue for being more active.  Follow-Up Plan: Attend Core Session 9 next week.  Bring completed Food and Activity Tracker next week to be reviewed by Lifestyle Coach.

## 2024-01-08 ENCOUNTER — Encounter (HOSPITAL_BASED_OUTPATIENT_CLINIC_OR_DEPARTMENT_OTHER): Payer: Self-pay | Admitting: Physical Therapy

## 2024-01-08 ENCOUNTER — Ambulatory Visit (HOSPITAL_BASED_OUTPATIENT_CLINIC_OR_DEPARTMENT_OTHER): Admitting: Physical Therapy

## 2024-01-08 DIAGNOSIS — M25661 Stiffness of right knee, not elsewhere classified: Secondary | ICD-10-CM

## 2024-01-08 DIAGNOSIS — M25662 Stiffness of left knee, not elsewhere classified: Secondary | ICD-10-CM

## 2024-01-08 DIAGNOSIS — R262 Difficulty in walking, not elsewhere classified: Secondary | ICD-10-CM

## 2024-01-08 DIAGNOSIS — M6281 Muscle weakness (generalized): Secondary | ICD-10-CM

## 2024-01-08 DIAGNOSIS — M25562 Pain in left knee: Secondary | ICD-10-CM | POA: Diagnosis not present

## 2024-01-08 DIAGNOSIS — G8929 Other chronic pain: Secondary | ICD-10-CM

## 2024-01-08 NOTE — Therapy (Signed)
 OUTPATIENT PHYSICAL THERAPY LOWER EXTREMITY TREATMENT   Patient Name: Barbara [name] MRN: [id number] DOB:1950/04/25, 73 y.o., female Today's Date: 01/08/2024  END OF SESSION:  PT End of Session - 01/08/24 0917     Visit Number 2    Number of Visits 12    Date for Recertification  02/12/24    Authorization Type [employer] MCR    Authorization Time Period 01/01/24 - 12/202/2025    Authorization - Number of Visits 20    PT Start Time 0846    PT Stop Time 0938    PT Time Calculation (min) 52 min    Activity Tolerance Patient tolerated treatment well           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, nickel  03/31/2017   Anxiety    Arthritis    knees   Cancer (HCC)    basal cell- nose   GERD (gastroesophageal reflux disease)    Hyperlipidemia    Hypertension    Meningioma (HCC)    Neuromuscular disorder (HCC)    benign tremor- takes Propanolol   PONV (postoperative nausea and vomiting)    BP DROPS ALSO   Trigeminal neuralgia of right side of face    Past Surgical History:  Procedure Laterality Date   43 HOUR PH STUDY N/A 04/17/2016   Procedure: 24 HOUR PH STUDY;  Surgeon: Elspeth Deward Naval, MD;  Location: [facility];  Service: Gastroenterology;  Laterality: N/A;   CHOLECYSTECTOMY N/A 08/18/2023   Procedure: LAPAROSCOPIC CHOLECYSTECTOMY;  Surgeon: Tanda Locus, MD;  Location: [facility];  Service: General;  Laterality: N/A;   ESOPHAGEAL MANOMETRY N/A 04/17/2016   Procedure: ESOPHAGEAL MANOMETRY (EM);  Surgeon: Elspeth Deward Naval, MD;  Location: [facility];  Service: Gastroenterology;  Laterality: N/A;   ESOPHAGOGASTRODUODENOSCOPY N/A 08/16/2023   Procedure: EGD (ESOPHAGOGASTRODUODENOSCOPY);  Surgeon: Abran Norleen SAILOR, MD;  Location: [facility];  Service: Gastroenterology;  Laterality: N/A;   EXTERNAL FIXATION LEG Left 03/28/2017   [place]   EXTERNAL FIXATION LEG Left 03/28/2017   Procedure: EXTERNAL FIXATION LEG;  Surgeon: Beverley Evalene BIRCH, MD;   Location: [facility];  Service: Orthopedics;  Laterality: Left;   EXTERNAL FIXATION REMOVAL Left 05/08/2017   Procedure: REMOVAL EXTERNAL FIXATION LEFT  LEG WITH HARDWARE REMOVAL;  Surgeon: Beverley Evalene BIRCH, MD;  Location: [facility];  Service: Orthopedics;  Laterality: Left;   fractured femur  05/2012   left - rod -screws placed   HARDWARE REMOVAL Left 04/16/2013   Procedure: REMOVAL OF HARDWARE OF LEFT KNEE (DISTAL INTERLOC SCREW);  Surgeon: Dempsey LULLA Moan, MD;  Location: [facility];  Service: Orthopedics;  Laterality: Left;   INDOCYANINE GREEN  FLUORESCENCE IMAGING (ICG) N/A 08/18/2023   Procedure: INDOCYANINE GREEN  FLUORESCENCE IMAGING (ICG);  Surgeon: Tanda Locus, MD;  Location: [facility];  Service: General;  Laterality: N/A;   KNEE SURGERY     x 3 left / x1 rt knee   LEFT HEART CATH AND CORONARY ANGIOGRAPHY N/A 09/18/2016   Procedure: Left Heart Cath and Coronary Angiography;  Surgeon: Wonda Sharper, MD;  Location: [facility];  Service: Cardiovascular;  Laterality: N/A;   NASAL SINUS SURGERY     2000   OTHER SURGICAL HISTORY  2014   titanium rod in left femur used for fracture repair   SHOULDER SURGERY     bilateral shoulders    WISDOM TOOTH EXTRACTION     Patient Active Problem List   Diagnosis Date Noted   Abdominal pain  08/16/2023   Calculus of gallbladder without cholecystitis without obstruction 08/16/2023   Recurrent biliary colic 08/16/2023   Hiatal hernia 08/16/2023   Degenerative arthritis of right knee 09/28/2022   Leg pain 08/08/2022   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, nickel  03/31/2017   Closed left tibial fracture 03/28/2017   Closed fracture of left tibial plateau 03/28/2017   Nonintractable headache 01/25/2017   Visual field defect    Chest pain 09/16/2016   Essential tremor 09/16/2016   Right facial numbness 09/16/2016   Bilateral scleritis 08/29/2016   Obesity 06/01/2016   Dyspnea on exertion 05/13/2016   Hoarseness 03/17/2016   Gastroesophageal reflux  disease without esophagitis 03/17/2016   Globus sensation 03/17/2016   Gastritis 02/25/2016   Atypical chest pain 02/24/2016   Painful orthopaedic hardware 04/15/2013   Vitamin D deficiency 12/09/2009   HYPERCHOLESTEROLEMIA 12/09/2009   Essential hypertension 12/09/2009   Allergic rhinitis 12/09/2009   NEPHROLITHIASIS 12/09/2009   CERVICAL POLYP 12/09/2009   Osteoarthritis 12/09/2009   Cough variant asthma vs UACS  12/09/2009     REFERRING PROVIDER: Margeret Kotyk, MD   REFERRING DIAG: M79.606 (ICD-10-CM) - Generalized pain of knee region   THERAPY DIAG:  Chronic pain of left knee  Muscle weakness (generalized)  Chronic pain of right knee  Stiffness of right knee, not elsewhere classified  Stiffness of left knee, not elsewhere classified  Difficulty in walking, not elsewhere classified  Rationale for Evaluation and Treatment: Rehabilitation  ONSET DATE: chronic sx's ; PT order 12/25/2023  SUBJECTIVE:   SUBJECTIVE STATEMENT: Pt reports having soreness in bilat knees after last visit, though not horrible.  Pt states she performs supine marching, supine SLR, and standing heel raises at home.  Pt wears a customized DonJoy brace on L knee which provides her more confidence with walking.  She is limited with standing duration and ambulation distance.   PERTINENT HISTORY: PMHx:  Hx of L femur and tibia fractures with multiple surgeries including hardware removal (last surgery in 2019); R knee arthroscopic surgery in the 90's ; Allergy to nickel and autoimmune disorder, osteoporosis though Pt states it has improved and may be osteopenia ; OA in knees, cervical pain, bilat shoulder surgeries,  R achilles pain due to a reaction from statins, meningioma, and hx trigeminal neuralgia   Gallbladder removal 06/25  PAIN:  L knee:  0-1/10 current at rest, 7-8/10 worst, 1-1.5/10 best. 6-7/10 when standing up and walking R knee:  0-1/10 at rest, 4-6/10 worst 4-5/10 when standing up  and walking   PRECAUTIONS: Other: Hx of L LE fractures/surgeries, allergy to nickel, osteoporosis   WEIGHT BEARING RESTRICTIONS: No  FALLS:  Has patient fallen in last 6 months? No  LIVING ENVIRONMENT: Lives with: lives with their spouse Lives in: House/apartment Stairs: 2 STE in garage, 4 STE B rails; no steps inside home     PLOF: Independent  PATIENT GOALS: to be able to stand without pain.  Improved walking after sitting   OBJECTIVE:  Note: Objective measures were completed at Evaluation unless otherwise noted.  DIAGNOSTIC FINDINGS:   PATIENT SURVEYS:  LEFS:  22/80  COGNITION: Overall cognitive status: Within functional limits for tasks assessed     EDEMA:  Pt wearing a customized DonJoy brace on L knee.    LOWER EXTREMITY ROM:  AROM/PROM Right eval Left eval  Hip flexion    Hip extension    Hip abduction    Hip adduction    Hip internal rotation    Hip external rotation  Knee flexion 115 121  Knee extension 20/17 5/3  Ankle dorsiflexion    Ankle plantarflexion    Ankle inversion    Ankle eversion     (Blank rows = not tested)  LOWER EXTREMITY MMT:  MMT Right eval Left eval  Hip flexion 4/5 4+/5  Hip extension    Hip abduction 4-5/ with pain Unable to tolerate resistance, painful  Hip adduction    Hip internal rotation    Hip external rotation 5/5 4-/5  Knee flexion 5/5 seated 5/5 seated  Knee extension 4/5 4-/5  Ankle dorsiflexion    Ankle plantarflexion    Ankle inversion    Ankle eversion     (Blank rows = not tested)    FUNCTIONAL TESTS:  5x STS test:  19.30 sec without UE's  GAIT: Comments:  Pt ambulates without an AD with DonJoy brace on L knee.  She has a very slow gait speed.  Increased knee IR on L and decreased flexion and extension t/o gaity cycle.                                                                                                                                 TREATMENT:       -Reviewed pain  levels, response to prior treatment, and current HEP.    -Pt performed:                     Nustep at L3 x 5 mins with bilat UE/Les    LAQ 2x10    Seated hip abduction with RTB 2x10    Supine marching with PPT x20    Supine SLR 2x10    Supine clams with RTB 2 sets of 10-12 reps    SAQ 2x10  L LE    Supine knee extension stretch with heel propped on half roll x 1 min  R LE    Seated knee extension stretch with heel propped on stool x 2 mins R LE    Sit to stands without Ue's x 7 reps    Standing with NBOS without UE support x 40 seconds  PT gave pt a HEP handout and educated pt in correct form and appropriate frequency.     PATIENT EDUCATION:  Education details: dx, exercise form, relevant anatomy, POC, rationale of interventions, and what to expect next treatment.  PT answered pt's questions.   Person educated: Patient Education method: Explanation, demonstration, verbal and tactile cues, handout Education comprehension: verbalized understanding and needs further education, verbal and tactile cues required, returned demonstration  HOME EXERCISE PROGRAM: Access Code: [email or web address] URL: [email or web address] Date: 01/08/2024 Prepared by: Mose Minerva  Exercises - Seated Long Arc Quad  - 1 x daily - 7 x weekly - 2 sets - 10 reps - Supine Short Arc Quad  - 1 x daily - 7 x weekly - 2 sets - 10 reps - Seated knee extension Stretch  - 2  x daily - 7 x weekly - 1 reps - 2 minutes hold - Supine March  - 1 x daily - 7 x weekly - 2 sets - 15 reps - Seated Hip Abduction with Resistance  - 1 x daily - 4 x weekly - 2 sets - 10 reps  ASSESSMENT:  CLINICAL IMPRESSION: PT reviewed her current home program.  PT established HEP today.  Pt performed exercises well with cuing and instruction from PT for correct form and positioning.  She has significant knee extension deficits on R knee.  Pt ambulates with deficits in knee extension which is worse on R.  She has extensor lag bilat with  supine SLR though is worse on R.  She has very limited ROM in SAQ on R and PT instructed pt to only perform SAQ at home with L LE.  Pt gave pt a HEP handout and pt demonstrates good understanding.  She tolerated treatment well reporting no increased pain, just soreness in posterior knees after treatment.  She should benefit from skilled PT to address impairments and improve overall function.       OBJECTIVE IMPAIRMENTS: Abnormal gait, decreased activity tolerance, decreased endurance, decreased mobility, difficulty walking, decreased ROM, decreased strength, hypomobility, impaired flexibility, and pain.   ACTIVITY LIMITATIONS: standing, squatting, stairs, transfers, and locomotion level  PARTICIPATION LIMITATIONS: meal prep, cleaning, laundry, shopping, and community activity  PERSONAL FACTORS: Fitness, Time since onset of injury/illness/exacerbation, and 3+ comorbidities: Hx of L femur and tibia fractures with multiple surgeries, autoimmune disorder, osteoporosis though Pt states it has improved and may be osteopenia ; OA in knees, R achilles pain are also affecting patient's functional outcome.   REHAB POTENTIAL: Fair prior responses to PT, chronic condition  CLINICAL DECISION MAKING: Evolving/moderate complexity  EVALUATION COMPLEXITY: Moderate   GOALS:   SHORT TERM GOALS: Target date:  01/22/2024 Pt will be independent and compliant with HEP for improved pain, strength, ROM, and function.  Baseline: Goal status: INITIAL  2.  Pt will demo improved L knee extension AROM to 0 deg for improved gait and stiffness.  Baseline:  Goal status: INITIAL  3.  Pt will be able to stand for 7-8 mins with < 6/10 pain. Baseline:  Goal status: INITIAL  4.  Pt will report at least a 25% improvement in pain and sx's overall.   Baseline:  Goal status: INITIAL   LONG TERM GOALS: Target date: 02/12/2024   Pt will report she is able to stand for longer duration to perform household chores with  decreased seated rest breaks.  Baseline:  Goal status: INITIAL  2.  Pt will be able to stand for the majority of time while she is cooking without significant increased pain. Baseline:  Goal status: INITIAL  3.  Pt will be able to perform 5x STS test in < than 14 sec for improved functional LE strength and performance of transfers.  Baseline:  Goal status: INITIAL  4.  Pt will report at least a 70% improvement with her daily ambulation.   Baseline:  Goal status: INITIAL  5.  Pt will report she is able to stand 15 mins without significant increased pain.  Baseline:  Goal status: INITIAL  6.  Pt will demo improved LE strength to 5/5 in bilat  knee extension and hip flexion and 4/5 in L hip abduction for improved performance of and tolerance with functional mobility.  Baseline:  Goal status: INITIAL   PLAN:  PT FREQUENCY: 2x/week  PT DURATION: 6 weeks  PLANNED INTERVENTIONS: 97164- PT Re-evaluation, 97750- Physical Performance Testing, 97110-Therapeutic exercises, 97530- Therapeutic activity, [id number]- Neuromuscular re-education, 97535- Self Care, [id number]- Manual therapy, [id number]- Gait training, [phone]- Aquatic Therapy, [phone]- Electrical stimulation (unattended), 20560 (1-2 muscles), 20561 (3+ muscles)- Dry Needling, Patient/Family education, Balance training, Stair training, Taping, Cryotherapy, and Moist heat  PLAN FOR NEXT SESSION:  Review HEP.  LE strengthening, knee ROM, and gait and mobility training.  Pt has a nickel allergy.   Leigh Minerva III PT, DPT 01/08/24 9:59 PM

## 2024-01-11 ENCOUNTER — Encounter: Attending: Family Medicine | Admitting: Dietician

## 2024-01-11 ENCOUNTER — Encounter: Payer: Self-pay | Admitting: Dietician

## 2024-01-11 DIAGNOSIS — R7303 Prediabetes: Secondary | ICD-10-CM

## 2024-01-11 NOTE — Progress Notes (Signed)
 Patient was seen on 01/11/24 for the Core Session 9 of Diabetes Prevention Program course at [facility]. By the end of this session patients are able to complete the following objectives:   Learning Objectives: List and describe five steps to problem solving.  Apply the five problem solving steps to resolve a problem he or she has with eating less fat and fewer calories or being more active.   Goals:  Record weight taken outside of class.  Track foods and beverages eaten each day in the Food and Activity Tracker, including calories and fat grams for each item.   Track activity type, minutes you were active, and distance you reached each day in the Food and Activity Tracker.  Set aside one 20 to 30-minute block of time every day or find two or more periods of 10 to15 minutes each for physical activity.  Use problem solving action plan created during session to problem solve.   Follow-Up Plan: Attend Core Session 10 next week.  Bring completed Food and Activity Tracker next week to be reviewed by Lifestyle Coach. Bring menus from favorite restaurants to next session for future discussion.

## 2024-01-15 ENCOUNTER — Ambulatory Visit (HOSPITAL_BASED_OUTPATIENT_CLINIC_OR_DEPARTMENT_OTHER): Attending: Orthopedic Surgery | Admitting: Physical Therapy

## 2024-01-15 DIAGNOSIS — R262 Difficulty in walking, not elsewhere classified: Secondary | ICD-10-CM | POA: Insufficient documentation

## 2024-01-15 DIAGNOSIS — M79604 Pain in right leg: Secondary | ICD-10-CM | POA: Insufficient documentation

## 2024-01-15 DIAGNOSIS — G8929 Other chronic pain: Secondary | ICD-10-CM | POA: Insufficient documentation

## 2024-01-15 DIAGNOSIS — M25661 Stiffness of right knee, not elsewhere classified: Secondary | ICD-10-CM | POA: Insufficient documentation

## 2024-01-15 DIAGNOSIS — M25662 Stiffness of left knee, not elsewhere classified: Secondary | ICD-10-CM | POA: Diagnosis present

## 2024-01-15 DIAGNOSIS — M6281 Muscle weakness (generalized): Secondary | ICD-10-CM | POA: Diagnosis present

## 2024-01-15 DIAGNOSIS — M25561 Pain in right knee: Secondary | ICD-10-CM | POA: Insufficient documentation

## 2024-01-15 DIAGNOSIS — M25562 Pain in left knee: Secondary | ICD-10-CM | POA: Insufficient documentation

## 2024-01-15 NOTE — Therapy (Signed)
 OUTPATIENT PHYSICAL THERAPY LOWER EXTREMITY TREATMENT   Patient Name: Barbara [name] MRN: [id number] DOB:18-Aug-1950, 73 y.o., female Today's Date: 01/16/2024  END OF SESSION:  PT End of Session - 01/15/24 0854     Visit Number 3    Number of Visits 12    Date for Recertification  02/12/24    Authorization Type [employer] MCR    PT Start Time 9146    PT Stop Time 0933    PT Time Calculation (min) 40 min    Activity Tolerance Patient tolerated treatment well    Behavior During Therapy [facility] for tasks assessed/performed           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, nickel  03/31/2017   Anxiety    Arthritis    knees   Cancer (HCC)    basal cell- nose   GERD (gastroesophageal reflux disease)    Hyperlipidemia    Hypertension    Meningioma (HCC)    Neuromuscular disorder (HCC)    benign tremor- takes Propanolol   PONV (postoperative nausea and vomiting)    BP DROPS ALSO   Trigeminal neuralgia of right side of face    Past Surgical History:  Procedure Laterality Date   61 HOUR PH STUDY N/A 04/17/2016   Procedure: 24 HOUR PH STUDY;  Surgeon: Elspeth Deward Naval, MD;  Location: [facility];  Service: Gastroenterology;  Laterality: N/A;   CHOLECYSTECTOMY N/A 08/18/2023   Procedure: LAPAROSCOPIC CHOLECYSTECTOMY;  Surgeon: Tanda Locus, MD;  Location: [facility];  Service: General;  Laterality: N/A;   ESOPHAGEAL MANOMETRY N/A 04/17/2016   Procedure: ESOPHAGEAL MANOMETRY (EM);  Surgeon: Elspeth Deward Naval, MD;  Location: [facility];  Service: Gastroenterology;  Laterality: N/A;   ESOPHAGOGASTRODUODENOSCOPY N/A 08/16/2023   Procedure: EGD (ESOPHAGOGASTRODUODENOSCOPY);  Surgeon: Abran Norleen SAILOR, MD;  Location: [facility];  Service: Gastroenterology;  Laterality: N/A;   EXTERNAL FIXATION LEG Left 03/28/2017   [place]   EXTERNAL FIXATION LEG Left 03/28/2017   Procedure: EXTERNAL FIXATION LEG;  Surgeon: Beverley Evalene BIRCH, MD;  Location: [facility];  Service:  Orthopedics;  Laterality: Left;   EXTERNAL FIXATION REMOVAL Left 05/08/2017   Procedure: REMOVAL EXTERNAL FIXATION LEFT  LEG WITH HARDWARE REMOVAL;  Surgeon: Beverley Evalene BIRCH, MD;  Location: [facility];  Service: Orthopedics;  Laterality: Left;   fractured femur  05/2012   left - rod -screws placed   HARDWARE REMOVAL Left 04/16/2013   Procedure: REMOVAL OF HARDWARE OF LEFT KNEE (DISTAL INTERLOC SCREW);  Surgeon: Dempsey LULLA Moan, MD;  Location: [facility];  Service: Orthopedics;  Laterality: Left;   INDOCYANINE GREEN  FLUORESCENCE IMAGING (ICG) N/A 08/18/2023   Procedure: INDOCYANINE GREEN  FLUORESCENCE IMAGING (ICG);  Surgeon: Tanda Locus, MD;  Location: [facility];  Service: General;  Laterality: N/A;   KNEE SURGERY     x 3 left / x1 rt knee   LEFT HEART CATH AND CORONARY ANGIOGRAPHY N/A 09/18/2016   Procedure: Left Heart Cath and Coronary Angiography;  Surgeon: Wonda Sharper, MD;  Location: [facility] INVASIVE CV LAB;  Service: Cardiovascular;  Laterality: N/A;   NASAL SINUS SURGERY     2000   OTHER SURGICAL HISTORY  2014   titanium rod in left femur used for fracture repair   SHOULDER SURGERY     bilateral shoulders    WISDOM TOOTH EXTRACTION     Patient Active Problem List   Diagnosis Date Noted   Abdominal pain 08/16/2023   Calculus of gallbladder without cholecystitis  without obstruction 08/16/2023   Recurrent biliary colic 08/16/2023   Hiatal hernia 08/16/2023   Degenerative arthritis of right knee 09/28/2022   Leg pain 08/08/2022   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, nickel  03/31/2017   Closed left tibial fracture 03/28/2017   Closed fracture of left tibial plateau 03/28/2017   Nonintractable headache 01/25/2017   Visual field defect    Chest pain 09/16/2016   Essential tremor 09/16/2016   Right facial numbness 09/16/2016   Bilateral scleritis 08/29/2016   Obesity 06/01/2016   Dyspnea on exertion 05/13/2016   Hoarseness 03/17/2016   Gastroesophageal reflux disease without esophagitis  03/17/2016   Globus sensation 03/17/2016   Gastritis 02/25/2016   Atypical chest pain 02/24/2016   Painful orthopaedic hardware 04/15/2013   Vitamin D deficiency 12/09/2009   HYPERCHOLESTEROLEMIA 12/09/2009   Essential hypertension 12/09/2009   Allergic rhinitis 12/09/2009   NEPHROLITHIASIS 12/09/2009   CERVICAL POLYP 12/09/2009   Osteoarthritis 12/09/2009   Cough variant asthma vs UACS  12/09/2009     REFERRING PROVIDER: Margeret Kotyk, MD   REFERRING DIAG: M79.606 (ICD-10-CM) - Generalized pain of knee region   THERAPY DIAG:  Chronic pain of left knee  Muscle weakness (generalized)  Chronic pain of right knee  Stiffness of right knee, not elsewhere classified  Stiffness of left knee, not elsewhere classified  Difficulty in walking, not elsewhere classified  Rationale for Evaluation and Treatment: Rehabilitation  ONSET DATE: chronic sx's ; PT order 12/25/2023  SUBJECTIVE:   SUBJECTIVE STATEMENT: Pt reports having soreness after prior treatment though no increased pain.  Pt is limited with standing and has increased pain.  Pt has increased pain when standing up after sitting awhile.  She has to stand for a time before she can begin walking.  Pt reports compliance with HEP.   in bilat knees after last visit, though not horrible.  Pt states she performs supine marching, supine SLR, and standing heel raises at home.  Pt wears a customized DonJoy brace on L knee which provides her more confidence with walking.  She is limited with standing duration and ambulation distance.   PERTINENT HISTORY: PMHx:  Hx of L femur and tibia fractures with multiple surgeries including hardware removal (last surgery in 2019); R knee arthroscopic surgery in the 90's ; Allergy to nickel and autoimmune disorder, osteoporosis though Pt states it has improved and may be osteopenia ; OA in knees, cervical pain, bilat shoulder surgeries,  R achilles pain due to a reaction from statins, meningioma,  and hx trigeminal neuralgia   Gallbladder removal 06/25  PAIN:  L knee:  3-4/10 current at rest, 7-8/10 worst, 1-1.5/10 best. 6-7/10 when standing up and walking R knee:  2-3/10 at rest, 4-6/10 worst 4-5/10 when standing up and walking   PRECAUTIONS: Other: Hx of L LE fractures/surgeries, allergy to nickel, osteoporosis   WEIGHT BEARING RESTRICTIONS: No  FALLS:  Has patient fallen in last 6 months? No  LIVING ENVIRONMENT: Lives with: lives with their spouse Lives in: House/apartment Stairs: 2 STE in garage, 4 STE B rails; no steps inside home     PLOF: Independent  PATIENT GOALS: to be able to stand without pain.  Improved walking after sitting   OBJECTIVE:  Note: Objective measures were completed at Evaluation unless otherwise noted.  DIAGNOSTIC FINDINGS:   PATIENT SURVEYS:  LEFS:  22/80  COGNITION: Overall cognitive status: Within functional limits for tasks assessed     EDEMA:  Pt wearing a customized DonJoy brace on L knee.  LOWER EXTREMITY ROM:  AROM/PROM Right eval Left eval  Hip flexion    Hip extension    Hip abduction    Hip adduction    Hip internal rotation    Hip external rotation    Knee flexion 115 121  Knee extension 20/17 5/3  Ankle dorsiflexion    Ankle plantarflexion    Ankle inversion    Ankle eversion     (Blank rows = not tested)  LOWER EXTREMITY MMT:  MMT Right eval Left eval  Hip flexion 4/5 4+/5  Hip extension    Hip abduction 4-5/ with pain Unable to tolerate resistance, painful  Hip adduction    Hip internal rotation    Hip external rotation 5/5 4-/5  Knee flexion 5/5 seated 5/5 seated  Knee extension 4/5 4-/5  Ankle dorsiflexion    Ankle plantarflexion    Ankle inversion    Ankle eversion     (Blank rows = not tested)    FUNCTIONAL TESTS:  5x STS test:  19.30 sec without UE's  GAIT: Comments:  Pt ambulates without an AD with DonJoy brace on L knee.  She has a very slow gait speed.  Increased knee  IR on L and decreased flexion and extension t/o gaity cycle.                                                                                                                                 TREATMENT:       -Reviewed pain levels, response to prior treatment, and current HEP.    -Pt performed:                      LAQ 2x10    Seated hip abduction with RTB x15, GTB 2x10    SAQ 2x10  L LE, 1.5# 2x10    Sit to stands without Ue's x 10 reps    Marching with UE support     Standing with NBOS without UE support x 45 seconds each on floor and on airex    Nustep at L3-4 x 5 mins with bilat UE/LE's  Pt received R knee extension manual stretch in supine.      PATIENT EDUCATION:  Education details: dx, exercise form, relevant anatomy, POC, rationale of interventions, and what to expect next treatment.  PT answered pt's questions.   Person educated: Patient Education method: Explanation, demonstration, verbal and tactile cues, handout Education comprehension: verbalized understanding and needs further education, verbal and tactile cues required, returned demonstration  HOME EXERCISE PROGRAM: Access Code: [email or web address] URL: [email or web address] Date: 01/08/2024 Prepared by: Mose Minerva  Exercises - Seated Long Arc Quad  - 1 x daily - 7 x weekly - 2 sets - 10 reps - Supine Short Arc Quad  - 1 x daily - 7 x weekly - 2 sets - 10 reps - Seated knee extension Stretch  - 2 x daily - 7 x weekly -  1 reps - 2 minutes hold - Supine March  - 1 x daily - 7 x weekly - 2 sets - 15 reps - Seated Hip Abduction with Resistance  - 1 x daily - 4 x weekly - 2 sets - 10 reps  ASSESSMENT:  CLINICAL IMPRESSION: Pt has significant deficits in R knee extension and ambulates with bilat knee extension deficits though worse on R.  Pt ambulates with brace on L knee.  Pt performed exercises well with cuing and instruction in correct form.  Pt is able to stand on airex for 45 sec with NBOS without UE  support.  She tolerated treatment well reporting no increased pain after treatment.  She should benefit from skilled PT to address impairments and improve overall function.        OBJECTIVE IMPAIRMENTS: Abnormal gait, decreased activity tolerance, decreased endurance, decreased mobility, difficulty walking, decreased ROM, decreased strength, hypomobility, impaired flexibility, and pain.   ACTIVITY LIMITATIONS: standing, squatting, stairs, transfers, and locomotion level  PARTICIPATION LIMITATIONS: meal prep, cleaning, laundry, shopping, and community activity  PERSONAL FACTORS: Fitness, Time since onset of injury/illness/exacerbation, and 3+ comorbidities: Hx of L femur and tibia fractures with multiple surgeries, autoimmune disorder, osteoporosis though Pt states it has improved and may be osteopenia ; OA in knees, R achilles pain are also affecting patient's functional outcome.   REHAB POTENTIAL: Fair prior responses to PT, chronic condition  CLINICAL DECISION MAKING: Evolving/moderate complexity  EVALUATION COMPLEXITY: Moderate   GOALS:   SHORT TERM GOALS: Target date:  01/22/2024 Pt will be independent and compliant with HEP for improved pain, strength, ROM, and function.  Baseline: Goal status: INITIAL  2.  Pt will demo improved L knee extension AROM to 0 deg for improved gait and stiffness.  Baseline:  Goal status: INITIAL  3.  Pt will be able to stand for 7-8 mins with < 6/10 pain. Baseline:  Goal status: INITIAL  4.  Pt will report at least a 25% improvement in pain and sx's overall.   Baseline:  Goal status: INITIAL   LONG TERM GOALS: Target date: 02/12/2024   Pt will report she is able to stand for longer duration to perform household chores with decreased seated rest breaks.  Baseline:  Goal status: INITIAL  2.  Pt will be able to stand for the majority of time while she is cooking without significant increased pain. Baseline:  Goal status: INITIAL  3.  Pt  will be able to perform 5x STS test in < than 14 sec for improved functional LE strength and performance of transfers.  Baseline:  Goal status: INITIAL  4.  Pt will report at least a 70% improvement with her daily ambulation.   Baseline:  Goal status: INITIAL  5.  Pt will report she is able to stand 15 mins without significant increased pain.  Baseline:  Goal status: INITIAL  6.  Pt will demo improved LE strength to 5/5 in bilat  knee extension and hip flexion and 4/5 in L hip abduction for improved performance of and tolerance with functional mobility.  Baseline:  Goal status: INITIAL   PLAN:  PT FREQUENCY: 2x/week  PT DURATION: 6 weeks  PLANNED INTERVENTIONS: [id number]- PT Re-evaluation, 97750- Physical Performance Testing, 97110-Therapeutic exercises, 97530- Therapeutic activity, [id number]- Neuromuscular re-education, 97535- Self Care, [id number]- Manual therapy, [id number]- Gait training, [phone]- Aquatic Therapy, [phone]- Electrical stimulation (unattended), [phone] (1-2 muscles), [id number] (3+ muscles)- Dry Needling, Patient/Family education, Balance training, Stair training, Taping, Cryotherapy, and Moist heat  PLAN  FOR NEXT SESSION:  Review HEP.  LE strengthening, knee ROM, and gait and mobility training.  Pt has a nickel allergy.   Leigh Minerva III PT, DPT 01/16/24 1:28 PM

## 2024-01-16 ENCOUNTER — Encounter (HOSPITAL_BASED_OUTPATIENT_CLINIC_OR_DEPARTMENT_OTHER): Payer: Self-pay | Admitting: Physical Therapy

## 2024-01-17 ENCOUNTER — Encounter (HOSPITAL_BASED_OUTPATIENT_CLINIC_OR_DEPARTMENT_OTHER): Payer: Self-pay | Admitting: Physical Therapy

## 2024-01-17 ENCOUNTER — Ambulatory Visit (HOSPITAL_BASED_OUTPATIENT_CLINIC_OR_DEPARTMENT_OTHER): Admitting: Physical Therapy

## 2024-01-17 DIAGNOSIS — M25662 Stiffness of left knee, not elsewhere classified: Secondary | ICD-10-CM

## 2024-01-17 DIAGNOSIS — M25661 Stiffness of right knee, not elsewhere classified: Secondary | ICD-10-CM

## 2024-01-17 DIAGNOSIS — M25562 Pain in left knee: Secondary | ICD-10-CM | POA: Diagnosis not present

## 2024-01-17 DIAGNOSIS — G8929 Other chronic pain: Secondary | ICD-10-CM

## 2024-01-17 DIAGNOSIS — R262 Difficulty in walking, not elsewhere classified: Secondary | ICD-10-CM

## 2024-01-17 DIAGNOSIS — M6281 Muscle weakness (generalized): Secondary | ICD-10-CM

## 2024-01-17 NOTE — Therapy (Signed)
 OUTPATIENT PHYSICAL THERAPY LOWER EXTREMITY TREATMENT   Patient Name: Barbara [name] MRN: [id number] DOB:Jun 03, 1950, 73 y.o., female Today's Date: 01/18/2024  END OF SESSION:  PT End of Session - 01/17/24 1544     Visit Number 4    Number of Visits 12    Date for Recertification  02/12/24    Authorization Type [employer] MCR    PT Start Time 1542    PT Stop Time 1623    PT Time Calculation (min) 41 min    Activity Tolerance Patient tolerated treatment well    Behavior During Therapy [facility] for tasks assessed/performed           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, nickel  03/31/2017   Anxiety    Arthritis    knees   Cancer (HCC)    basal cell- nose   GERD (gastroesophageal reflux disease)    Hyperlipidemia    Hypertension    Meningioma (HCC)    Neuromuscular disorder (HCC)    benign tremor- takes Propanolol   PONV (postoperative nausea and vomiting)    BP DROPS ALSO   Trigeminal neuralgia of right side of face    Past Surgical History:  Procedure Laterality Date   77 HOUR PH STUDY N/A 04/17/2016   Procedure: 24 HOUR PH STUDY;  Surgeon: Elspeth Deward Naval, MD;  Location: [facility];  Service: Gastroenterology;  Laterality: N/A;   CHOLECYSTECTOMY N/A 08/18/2023   Procedure: LAPAROSCOPIC CHOLECYSTECTOMY;  Surgeon: Tanda Locus, MD;  Location: [facility];  Service: General;  Laterality: N/A;   ESOPHAGEAL MANOMETRY N/A 04/17/2016   Procedure: ESOPHAGEAL MANOMETRY (EM);  Surgeon: Elspeth Deward Naval, MD;  Location: [facility];  Service: Gastroenterology;  Laterality: N/A;   ESOPHAGOGASTRODUODENOSCOPY N/A 08/16/2023   Procedure: EGD (ESOPHAGOGASTRODUODENOSCOPY);  Surgeon: Abran Norleen SAILOR, MD;  Location: [facility];  Service: Gastroenterology;  Laterality: N/A;   EXTERNAL FIXATION LEG Left 03/28/2017   [place]   EXTERNAL FIXATION LEG Left 03/28/2017   Procedure: EXTERNAL FIXATION LEG;  Surgeon: Beverley Evalene BIRCH, MD;  Location: [facility];  Service:  Orthopedics;  Laterality: Left;   EXTERNAL FIXATION REMOVAL Left 05/08/2017   Procedure: REMOVAL EXTERNAL FIXATION LEFT  LEG WITH HARDWARE REMOVAL;  Surgeon: Beverley Evalene BIRCH, MD;  Location: [facility];  Service: Orthopedics;  Laterality: Left;   fractured femur  05/2012   left - rod -screws placed   HARDWARE REMOVAL Left 04/16/2013   Procedure: REMOVAL OF HARDWARE OF LEFT KNEE (DISTAL INTERLOC SCREW);  Surgeon: Dempsey LULLA Moan, MD;  Location: [facility];  Service: Orthopedics;  Laterality: Left;   INDOCYANINE GREEN  FLUORESCENCE IMAGING (ICG) N/A 08/18/2023   Procedure: INDOCYANINE GREEN  FLUORESCENCE IMAGING (ICG);  Surgeon: Tanda Locus, MD;  Location: [facility];  Service: General;  Laterality: N/A;   KNEE SURGERY     x 3 left / x1 rt knee   LEFT HEART CATH AND CORONARY ANGIOGRAPHY N/A 09/18/2016   Procedure: Left Heart Cath and Coronary Angiography;  Surgeon: Wonda Sharper, MD;  Location: [facility];  Service: Cardiovascular;  Laterality: N/A;   NASAL SINUS SURGERY     2000   OTHER SURGICAL HISTORY  2014   titanium rod in left femur used for fracture repair   SHOULDER SURGERY     bilateral shoulders    WISDOM TOOTH EXTRACTION     Patient Active Problem List   Diagnosis Date Noted   Abdominal pain 08/16/2023   Calculus of gallbladder without cholecystitis  without obstruction 08/16/2023   Recurrent biliary colic 08/16/2023   Hiatal hernia 08/16/2023   Degenerative arthritis of right knee 09/28/2022   Leg pain 08/08/2022   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, nickel  03/31/2017   Closed left tibial fracture 03/28/2017   Closed fracture of left tibial plateau 03/28/2017   Nonintractable headache 01/25/2017   Visual field defect    Chest pain 09/16/2016   Essential tremor 09/16/2016   Right facial numbness 09/16/2016   Bilateral scleritis 08/29/2016   Obesity 06/01/2016   Dyspnea on exertion 05/13/2016   Hoarseness 03/17/2016   Gastroesophageal reflux disease without esophagitis  03/17/2016   Globus sensation 03/17/2016   Gastritis 02/25/2016   Atypical chest pain 02/24/2016   Painful orthopaedic hardware 04/15/2013   Vitamin D deficiency 12/09/2009   HYPERCHOLESTEROLEMIA 12/09/2009   Essential hypertension 12/09/2009   Allergic rhinitis 12/09/2009   NEPHROLITHIASIS 12/09/2009   CERVICAL POLYP 12/09/2009   Osteoarthritis 12/09/2009   Cough variant asthma vs UACS  12/09/2009     REFERRING PROVIDER: Margeret Kotyk, MD   REFERRING DIAG: M79.606 (ICD-10-CM) - Generalized pain of knee region   THERAPY DIAG:  Chronic pain of left knee  Muscle weakness (generalized)  Chronic pain of right knee  Stiffness of right knee, not elsewhere classified  Stiffness of left knee, not elsewhere classified  Difficulty in walking, not elsewhere classified  Rationale for Evaluation and Treatment: Rehabilitation  ONSET DATE: chronic sx's ; PT order 12/25/2023  SUBJECTIVE:   SUBJECTIVE STATEMENT: Pt reports she has lost some weight since having her gallbladder removed.  She's not sure if the brace is affected by it, but it still works.  Pt states she had a little soreness after prior treatment though no increased pain.  Pt states her L knee goes much straighter than her R.  Pt is limited with standing and has increased pain with standing.  Pt reports compliance with HEP.    PERTINENT HISTORY: PMHx:  Hx of L femur and tibia fractures with multiple surgeries including hardware removal (last surgery in 2019); R knee arthroscopic surgery in the 90's ; Allergy to nickel and autoimmune disorder, osteoporosis though Pt states it has improved and may be osteopenia ; OA in knees, cervical pain, bilat shoulder surgeries,  R achilles pain due to a reaction from statins, meningioma, and hx trigeminal neuralgia   Gallbladder removal 06/25  PAIN:  L knee:  2-3/10 current at rest, 7-8/10 worst, 1-1.5/10 best. 6-7/10 when standing up and walking R knee:  0/10 at rest, 4-6/10  worst 4-5/10 when standing up and walking   PRECAUTIONS: Other: Hx of L LE fractures/surgeries, allergy to nickel, osteoporosis   WEIGHT BEARING RESTRICTIONS: No  FALLS:  Has patient fallen in last 6 months? No  LIVING ENVIRONMENT: Lives with: lives with their spouse Lives in: House/apartment Stairs: 2 STE in garage, 4 STE B rails; no steps inside home     PLOF: Independent  PATIENT GOALS: to be able to stand without pain.  Improved walking after sitting   OBJECTIVE:  Note: Objective measures were completed at Evaluation unless otherwise noted.  DIAGNOSTIC FINDINGS:   PATIENT SURVEYS:  LEFS:  22/80  COGNITION: Overall cognitive status: Within functional limits for tasks assessed     EDEMA:  Pt wearing a customized DonJoy brace on L knee.    LOWER EXTREMITY ROM:  AROM/PROM Right eval Left eval  Hip flexion    Hip extension    Hip abduction    Hip adduction  Hip internal rotation    Hip external rotation    Knee flexion 115 121  Knee extension 20/17 5/3  Ankle dorsiflexion    Ankle plantarflexion    Ankle inversion    Ankle eversion     (Blank rows = not tested)  LOWER EXTREMITY MMT:  MMT Right eval Left eval  Hip flexion 4/5 4+/5  Hip extension    Hip abduction 4-5/ with pain Unable to tolerate resistance, painful  Hip adduction    Hip internal rotation    Hip external rotation 5/5 4-/5  Knee flexion 5/5 seated 5/5 seated  Knee extension 4/5 4-/5  Ankle dorsiflexion    Ankle plantarflexion    Ankle inversion    Ankle eversion     (Blank rows = not tested)    FUNCTIONAL TESTS:  5x STS test:  19.30 sec without UE's  GAIT: Comments:  Pt ambulates without an AD with DonJoy brace on L knee.  She has a very slow gait speed.  Increased knee IR on L and decreased flexion and extension t/o gaity cycle.                                                                                                                                  TREATMENT:       -Reviewed pain levels, response to prior treatment, and current HEP.    -Pt performed:                      Nustep at L4 x 5 mins with bilat UE/LE's    Marching with UE support     Standing hip abduction 2x10    Sidestepping with UE's on rail x 2 laps    Standing with NBOS without UE support x 50 sec with NBOS and x 40 seconds with FA on airex    Standing wt shifts on airex s/s    Sit to stands without UE's 2 x 10 reps    LAQ 2x10, 0# on R LE, 1.5# on L LE    Seated hip abduction with GTB 2x15    Supine quad set with 5 sec hold with heel prop x 10 reps on R and x 6 reps on L          Pt received R knee extension manual stretch in supine.    PATIENT EDUCATION:  Education details: dx, exercise form, relevant anatomy, POC, and rationale of interventions.  PT answered pt's questions.   Person educated: Patient Education method: Explanation, demonstration, verbal and tactile cues, handout Education comprehension: verbalized understanding and needs further education, verbal and tactile cues required, returned demonstration  HOME EXERCISE PROGRAM: Access Code: [email or web address] URL: [email or web address] Date: 01/08/2024 Prepared by: Mose Minerva  Exercises - Seated Long Arc Quad  - 1 x daily - 7 x weekly - 2 sets - 10 reps - Supine Short Arc Quad  - 1 x daily -  7 x weekly - 2 sets - 10 reps - Seated knee extension Stretch  - 2 x daily - 7 x weekly - 1 reps - 2 minutes hold - Supine March  - 1 x daily - 7 x weekly - 2 sets - 15 reps - Seated Hip Abduction with Resistance  - 1 x daily - 4 x weekly - 2 sets - 10 reps  ASSESSMENT:  CLINICAL IMPRESSION: Pt has significant deficits in R knee extension and ambulates with bilat knee extension deficits though worse on R.  PT performed manual knee extension stretch on R to improve R knee mobility, gait, and stiffness.  Pt ambulates with brace on L knee.  Pt performed exercises well with cuing and instruction in  correct form.   It was actually more challenging for pt to perform airex standing with FA than NBOS due to knees not touching and providing support.  She tolerated treatment well and states she feels ok having minimally increased pain to 3-4/10 after treatment.  She should benefit from skilled PT to address impairments and improve overall function.      OBJECTIVE IMPAIRMENTS: Abnormal gait, decreased activity tolerance, decreased endurance, decreased mobility, difficulty walking, decreased ROM, decreased strength, hypomobility, impaired flexibility, and pain.   ACTIVITY LIMITATIONS: standing, squatting, stairs, transfers, and locomotion level  PARTICIPATION LIMITATIONS: meal prep, cleaning, laundry, shopping, and community activity  PERSONAL FACTORS: Fitness, Time since onset of injury/illness/exacerbation, and 3+ comorbidities: Hx of L femur and tibia fractures with multiple surgeries, autoimmune disorder, osteoporosis though Pt states it has improved and may be osteopenia ; OA in knees, R achilles pain are also affecting patient's functional outcome.   REHAB POTENTIAL: Fair prior responses to PT, chronic condition  CLINICAL DECISION MAKING: Evolving/moderate complexity  EVALUATION COMPLEXITY: Moderate   GOALS:   SHORT TERM GOALS: Target date:  01/22/2024 Pt will be independent and compliant with HEP for improved pain, strength, ROM, and function.  Baseline: Goal status: INITIAL  2.  Pt will demo improved L knee extension AROM to 0 deg for improved gait and stiffness.  Baseline:  Goal status: INITIAL  3.  Pt will be able to stand for 7-8 mins with < 6/10 pain. Baseline:  Goal status: INITIAL  4.  Pt will report at least a 25% improvement in pain and sx's overall.   Baseline:  Goal status: INITIAL   LONG TERM GOALS: Target date: 02/12/2024   Pt will report she is able to stand for longer duration to perform household chores with decreased seated rest breaks.  Baseline:   Goal status: INITIAL  2.  Pt will be able to stand for the majority of time while she is cooking without significant increased pain. Baseline:  Goal status: INITIAL  3.  Pt will be able to perform 5x STS test in < than 14 sec for improved functional LE strength and performance of transfers.  Baseline:  Goal status: INITIAL  4.  Pt will report at least a 70% improvement with her daily ambulation.   Baseline:  Goal status: INITIAL  5.  Pt will report she is able to stand 15 mins without significant increased pain.  Baseline:  Goal status: INITIAL  6.  Pt will demo improved LE strength to 5/5 in bilat  knee extension and hip flexion and 4/5 in L hip abduction for improved performance of and tolerance with functional mobility.  Baseline:  Goal status: INITIAL   PLAN:  PT FREQUENCY: 2x/week  PT DURATION: 6 weeks  PLANNED INTERVENTIONS: 97164- PT Re-evaluation, 97750- Physical Performance Testing, 97110-Therapeutic exercises, 97530- Therapeutic activity, [id number]- Neuromuscular re-education, 97535- Self Care, [id number]- Manual therapy, [id number]- Gait training, [phone]- Aquatic Therapy, [phone]- Electrical stimulation (unattended), 20560 (1-2 muscles), 20561 (3+ muscles)- Dry Needling, Patient/Family education, Balance training, Stair training, Taping, Cryotherapy, and Moist heat  PLAN FOR NEXT SESSION:  Review HEP.  LE strengthening, knee ROM, and gait and mobility training.  Pt has a nickel allergy.   Leigh Minerva III PT, DPT 01/18/24 1:36 PM

## 2024-01-18 ENCOUNTER — Encounter: Attending: Family Medicine | Admitting: Dietician

## 2024-01-18 ENCOUNTER — Encounter: Payer: Self-pay | Admitting: Dietician

## 2024-01-18 DIAGNOSIS — R7303 Prediabetes: Secondary | ICD-10-CM

## 2024-01-18 NOTE — Progress Notes (Signed)
 Patient was seen on 01/18/24 for the Core Session 10 of Diabetes Prevention Program course at [facility]. By the end of this session patients are able to complete the following objectives:   Learning Objectives: List and describe the four keys for healthy eating out.  Give examples of how to apply these keys at the type of restaurants that the participants go to regularly.  Make an appropriate meal selection from a restaurant menu.  Demonstrate how to ask for a substitute item using assertive language and a polite tone of voice.    Goals:  Record weight taken outside of class.  Track foods and beverages eaten each day in the Food and Activity Tracker, including calories and fat grams for each item.   Track activity type, minutes you were active, and distance you reached each day in the Food and Activity Tracker.  Set aside one 20 to 30-minute block of time every day or find two or more periods of 10 to15 minutes each for physical activity.  Utilize positive action plan and complete questions on To Do List.   Follow-Up Plan: Attend Core Session 11 next week.  Bring completed Food and Activity Tracker next week to be reviewed by Lifestyle Coach.

## 2024-01-22 ENCOUNTER — Ambulatory Visit (HOSPITAL_BASED_OUTPATIENT_CLINIC_OR_DEPARTMENT_OTHER): Admitting: Physical Therapy

## 2024-01-22 ENCOUNTER — Encounter (HOSPITAL_BASED_OUTPATIENT_CLINIC_OR_DEPARTMENT_OTHER): Payer: Self-pay | Admitting: Physical Therapy

## 2024-01-22 DIAGNOSIS — M6281 Muscle weakness (generalized): Secondary | ICD-10-CM

## 2024-01-22 DIAGNOSIS — M79604 Pain in right leg: Secondary | ICD-10-CM

## 2024-01-22 DIAGNOSIS — M25562 Pain in left knee: Secondary | ICD-10-CM | POA: Diagnosis not present

## 2024-01-22 DIAGNOSIS — M25661 Stiffness of right knee, not elsewhere classified: Secondary | ICD-10-CM

## 2024-01-22 DIAGNOSIS — M25662 Stiffness of left knee, not elsewhere classified: Secondary | ICD-10-CM

## 2024-01-22 DIAGNOSIS — R262 Difficulty in walking, not elsewhere classified: Secondary | ICD-10-CM

## 2024-01-22 DIAGNOSIS — G8929 Other chronic pain: Secondary | ICD-10-CM

## 2024-01-22 NOTE — Therapy (Signed)
 OUTPATIENT PHYSICAL THERAPY LOWER EXTREMITY TREATMENT   Patient Name: ROZANN HOLTS MRN: [id number] DOB:September 19, 1950, 73 y.o., female Today's Date: 01/22/2024  END OF SESSION:  PT End of Session - 01/22/24 1043     Visit Number 5    Number of Visits 12    Date for Recertification  02/12/24    Authorization Type [employer] M[employer]    Authorization Time Period 01/01/24 - 12/202/2025    Authorization - Number of Visits 20    PT Start Time 1019    PT Stop Time 1058    PT Time Calculation (min) 39 min    Activity Tolerance Patient tolerated treatment well    Behavior During Therapy [facility] for tasks assessed/performed            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, nickel  03/31/2017   Anxiety    Arthritis    knees   Cancer (HCC)    basal cell- nose   GERD (gastroesophageal reflux disease)    Hyperlipidemia    Hypertension    Meningioma (HCC)    Neuromuscular disorder (HCC)    benign tremor- takes Propanolol   PONV (postoperative nausea and vomiting)    BP DROPS ALSO   Trigeminal neuralgia of right side of face    Past Surgical History:  Procedure Laterality Date   71 HOUR PH STUDY N/A 04/17/2016   Procedure: 24 HOUR PH STUDY;  Surgeon: Elspeth Deward Naval, MD;  Location: [facility];  Service: Gastroenterology;  Laterality: N/A;   CHOLECYSTECTOMY N/A 08/18/2023   Procedure: LAPAROSCOPIC CHOLECYSTECTOMY;  Surgeon: Tanda Locus, MD;  Location: [facility];  Service: General;  Laterality: N/A;   ESOPHAGEAL MANOMETRY N/A 04/17/2016   Procedure: ESOPHAGEAL MANOMETRY (EM);  Surgeon: Elspeth Deward Naval, MD;  Location: [facility];  Service: Gastroenterology;  Laterality: N/A;   ESOPHAGOGASTRODUODENOSCOPY N/A 08/16/2023   Procedure: EGD (ESOPHAGOGASTRODUODENOSCOPY);  Surgeon: Abran Norleen SAILOR, MD;  Location: [facility];  Service: Gastroenterology;  Laterality: N/A;   EXTERNAL FIXATION LEG Left 03/28/2017   Tibia   EXTERNAL FIXATION LEG Left 03/28/2017    Procedure: EXTERNAL FIXATION LEG;  Surgeon: Beverley Evalene BIRCH, MD;  Location: [facility];  Service: Orthopedics;  Laterality: Left;   EXTERNAL FIXATION REMOVAL Left 05/08/2017   Procedure: REMOVAL EXTERNAL FIXATION LEFT  LEG WITH HARDWARE REMOVAL;  Surgeon: Beverley Evalene BIRCH, MD;  Location: [facility];  Service: Orthopedics;  Laterality: Left;   fractured femur  05/2012   left - rod -screws placed   HARDWARE REMOVAL Left 04/16/2013   Procedure: REMOVAL OF HARDWARE OF LEFT KNEE (DISTAL INTERLOC SCREW);  Surgeon: Dempsey LULLA Moan, MD;  Location: [facility];  Service: Orthopedics;  Laterality: Left;   INDOCYANINE GREEN  FLUORESCENCE IMAGING (ICG) N/A 08/18/2023   Procedure: INDOCYANINE GREEN  FLUORESCENCE IMAGING (ICG);  Surgeon: Tanda Locus, MD;  Location: [facility];  Service: General;  Laterality: N/A;   KNEE SURGERY     x 3 left / x1 rt knee   LEFT HEART CATH AND CORONARY ANGIOGRAPHY N/A 09/18/2016   Procedure: Left Heart Cath and Coronary Angiography;  Surgeon: Wonda Sharper, MD;  Location: [facility] INVASIVE CV LAB;  Service: Cardiovascular;  Laterality: N/A;   NASAL SINUS SURGERY     2000   OTHER SURGICAL HISTORY  2014   titanium rod in left femur used for fracture repair   SHOULDER SURGERY     bilateral shoulders    WISDOM TOOTH EXTRACTION     Patient Active  Problem List   Diagnosis Date Noted   Abdominal pain 08/16/2023   Calculus of gallbladder without cholecystitis without obstruction 08/16/2023   Recurrent biliary colic 08/16/2023   Hiatal hernia 08/16/2023   Degenerative arthritis of right knee 09/28/2022   Leg pain 08/08/2022   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, nickel  03/31/2017   Closed left tibial fracture 03/28/2017   Closed fracture of left tibial plateau 03/28/2017   Nonintractable headache 01/25/2017   Visual field defect    Chest pain 09/16/2016   Essential tremor 09/16/2016   Right facial numbness 09/16/2016   Bilateral scleritis 08/29/2016   Obesity 06/01/2016   Dyspnea on  exertion 05/13/2016   Hoarseness 03/17/2016   Gastroesophageal reflux disease without esophagitis 03/17/2016   Globus sensation 03/17/2016   Gastritis 02/25/2016   Atypical chest pain 02/24/2016   Painful orthopaedic hardware 04/15/2013   Vitamin D deficiency 12/09/2009   HYPERCHOLESTEROLEMIA 12/09/2009   Essential hypertension 12/09/2009   Allergic rhinitis 12/09/2009   NEPHROLITHIASIS 12/09/2009   CERVICAL POLYP 12/09/2009   Osteoarthritis 12/09/2009   Cough variant asthma vs UACS  12/09/2009     REFERRING PROVIDER: Margeret Kotyk, MD   REFERRING DIAG: M79.606 (ICD-10-CM) - Generalized pain of knee region   THERAPY DIAG:  Chronic pain of left knee  Muscle weakness (generalized)  Chronic pain of right knee  Stiffness of right knee, not elsewhere classified  Stiffness of left knee, not elsewhere classified  Difficulty in walking, not elsewhere classified  Pain in right leg  Rationale for Evaluation and Treatment: Rehabilitation  ONSET DATE: chronic sx's ; PT order 12/25/2023  SUBJECTIVE:   SUBJECTIVE STATEMENT:  Not doing great this morning, had a lot of pain and clicking last night and this sudden cold weather is causing more pain     PERTINENT HISTORY: PMHx:  Hx of L femur and tibia fractures with multiple surgeries including hardware removal (last surgery in 2019); R knee arthroscopic surgery in the 90's ; Allergy to nickel and autoimmune disorder, osteoporosis though Pt states it has improved and may be osteopenia ; OA in knees, cervical pain, bilat shoulder surgeries,  R achilles pain due to a reaction from statins, meningioma, and hx trigeminal neuralgia   Gallbladder removal 06/25  PAIN:  L knee:  1-2/10 sitting 6-7/10 when standing up and walking R knee:  1-2/10 sitting 6-7/10 when standing up and walking   PRECAUTIONS: Other: Hx of L LE fractures/surgeries, allergy to nickel, osteoporosis   WEIGHT BEARING RESTRICTIONS: No  FALLS:  Has  patient fallen in last 6 months? No  LIVING ENVIRONMENT: Lives with: lives with their spouse Lives in: House/apartment Stairs: 2 STE in garage, 4 STE B rails; no steps inside home     PLOF: Independent  PATIENT GOALS: to be able to stand without pain.  Improved walking after sitting   OBJECTIVE:  Note: Objective measures were completed at Evaluation unless otherwise noted.  DIAGNOSTIC FINDINGS:   PATIENT SURVEYS:  LEFS:  22/80  COGNITION: Overall cognitive status: Within functional limits for tasks assessed     EDEMA:  Pt wearing a customized DonJoy brace on L knee.    LOWER EXTREMITY ROM:  AROM/PROM Right eval Left eval Right AROM 01/22/24 Left AROM 01/22/24  Hip flexion      Hip extension      Hip abduction      Hip adduction      Hip internal rotation      Hip external rotation      Knee flexion  115 121 110* 116*  Knee extension 20/17 5/3 18* 5*  Ankle dorsiflexion      Ankle plantarflexion      Ankle inversion      Ankle eversion       (Blank rows = not tested)  LOWER EXTREMITY MMT:  MMT Right eval Left eval  Hip flexion 4/5 4+/5  Hip extension    Hip abduction 4-5/ with pain Unable to tolerate resistance, painful  Hip adduction    Hip internal rotation    Hip external rotation 5/5 4-/5  Knee flexion 5/5 seated 5/5 seated  Knee extension 4/5 4-/5  Ankle dorsiflexion    Ankle plantarflexion    Ankle inversion    Ankle eversion     (Blank rows = not tested)    FUNCTIONAL TESTS:  5x STS test:  19.30 sec without UE's  GAIT: Comments:  Pt ambulates without an AD with DonJoy brace on L knee.  She has a very slow gait speed.  Increased knee IR on L and decreased flexion and extension t/o gaity cycle.                                                                                                                                 TREATMENT:     01/22/24  Nustep L3x8 minutes all four extremities seat 7  ROM, Goals  Bridges + ABD into red  TB x12  LAQs 2# 2x10 B Seated SLRs 2x10 B 0#  Standing hip ABD red TB x10 B    PATIENT EDUCATION:  Education details: dx, exercise form, relevant anatomy, POC, and rationale of interventions.  PT answered pt's questions.   Person educated: Patient Education method: Explanation, demonstration, verbal and tactile cues, handout Education comprehension: verbalized understanding and needs further education, verbal and tactile cues required, returned demonstration  HOME EXERCISE PROGRAM: Access Code: [email or web address] URL: [email or web address] Date: 01/08/2024 Prepared by: Mose Minerva  Exercises - Seated Long Arc Quad  - 1 x daily - 7 x weekly - 2 sets - 10 reps - Supine Short Arc Quad  - 1 x daily - 7 x weekly - 2 sets - 10 reps - Seated knee extension Stretch  - 2 x daily - 7 x weekly - 1 reps - 2 minutes hold - Supine March  - 1 x daily - 7 x weekly - 2 sets - 15 reps - Seated Hip Abduction with Resistance  - 1 x daily - 4 x weekly - 2 sets - 10 reps  ASSESSMENT:  CLINICAL IMPRESSION:  Arrived today feeling OK but having increased pain due to our cold snap. 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 Knee extension still severely limited especially R LE.     OBJECTIVE IMPAIRMENTS: Abnormal gait, decreased activity tolerance, decreased endurance, decreased mobility, difficulty walking, decreased ROM, decreased strength, hypomobility, impaired flexibility, and pain.   ACTIVITY LIMITATIONS: standing, squatting, stairs, transfers, and locomotion  level  PARTICIPATION LIMITATIONS: meal prep, cleaning, laundry, shopping, and community activity  PERSONAL FACTORS: Fitness, Time since onset of injury/illness/exacerbation, and 3+ comorbidities: Hx of L femur and tibia fractures with multiple surgeries, autoimmune disorder, osteoporosis though Pt states it has improved and may be osteopenia ; OA in knees, R achilles pain are also affecting patient's  functional outcome.   REHAB POTENTIAL: Fair prior responses to PT, chronic condition  CLINICAL DECISION MAKING: Evolving/moderate complexity  EVALUATION COMPLEXITY: Moderate   GOALS:   SHORT TERM GOALS: Target date:  01/22/2024 Pt will be independent and compliant with HEP for improved pain, strength, ROM, and function.  Baseline: Goal status: MET 01/22/24  2.  Pt will demo improved L knee extension AROM to 0 deg for improved gait and stiffness.  Baseline:  Goal status: ONGOING 01/22/24  3.  Pt will be able to stand for 7-8 mins with < 6/10 pain. Baseline:  Goal status: ONGOING 01/22/24 3-4 minutes before knees start hurting per pt   4.  Pt will report at least a 25% improvement in pain and sx's overall.   Baseline:  Goal status: ONGOING 01/22/24   LONG TERM GOALS: Target date: 02/12/2024   Pt will report she is able to stand for longer duration to perform household chores with decreased seated rest breaks.  Baseline:  Goal status: INITIAL  2.  Pt will be able to stand for the majority of time while she is cooking without significant increased pain. Baseline:  Goal status: INITIAL  3.  Pt will be able to perform 5x STS test in < than 14 sec for improved functional LE strength and performance of transfers.  Baseline:  Goal status: INITIAL  4.  Pt will report at least a 70% improvement with her daily ambulation.   Baseline:  Goal status: INITIAL  5.  Pt will report she is able to stand 15 mins without significant increased pain.  Baseline:  Goal status: INITIAL  6.  Pt will demo improved LE strength to 5/5 in bilat  knee extension and hip flexion and 4/5 in L hip abduction for improved performance of and tolerance with functional mobility.  Baseline:  Goal status: INITIAL   PLAN:  PT FREQUENCY: 2x/week  PT DURATION: 6 weeks  PLANNED INTERVENTIONS: [id number]- PT Re-evaluation, 97750- Physical Performance Testing, 97110-Therapeutic exercises, 97530- Therapeutic  activity, [id number]- Neuromuscular re-education, 97535- Self Care, [id number]- Manual therapy, [id number]- Gait training, [phone]- Aquatic Therapy, [phone]- Electrical stimulation (unattended), 20560 (1-2 muscles), 20561 (3+ muscles)- Dry Needling, Patient/Family education, Balance training, Stair training, Taping, Cryotherapy, and Moist heat  PLAN FOR NEXT SESSION:  Review HEP.  LE strengthening, knee ROM, and gait and mobility training.  Pt has a nickel allergy. Manual as tolerated    Josette Rough, PT, DPT 01/22/24 11:01 AM

## 2024-01-25 ENCOUNTER — Encounter (HOSPITAL_BASED_OUTPATIENT_CLINIC_OR_DEPARTMENT_OTHER): Payer: Self-pay | Admitting: Physical Therapy

## 2024-01-25 ENCOUNTER — Ambulatory Visit (HOSPITAL_BASED_OUTPATIENT_CLINIC_OR_DEPARTMENT_OTHER): Payer: Self-pay | Admitting: Physical Therapy

## 2024-01-25 ENCOUNTER — Encounter: Attending: Family Medicine | Admitting: Dietician

## 2024-01-25 ENCOUNTER — Encounter: Payer: Self-pay | Admitting: Dietician

## 2024-01-25 DIAGNOSIS — G8929 Other chronic pain: Secondary | ICD-10-CM

## 2024-01-25 DIAGNOSIS — M25662 Stiffness of left knee, not elsewhere classified: Secondary | ICD-10-CM

## 2024-01-25 DIAGNOSIS — M6281 Muscle weakness (generalized): Secondary | ICD-10-CM

## 2024-01-25 DIAGNOSIS — R262 Difficulty in walking, not elsewhere classified: Secondary | ICD-10-CM

## 2024-01-25 DIAGNOSIS — R7303 Prediabetes: Secondary | ICD-10-CM | POA: Diagnosis not present

## 2024-01-25 DIAGNOSIS — M25661 Stiffness of right knee, not elsewhere classified: Secondary | ICD-10-CM

## 2024-01-25 DIAGNOSIS — M25562 Pain in left knee: Secondary | ICD-10-CM | POA: Diagnosis not present

## 2024-01-25 NOTE — Progress Notes (Addendum)
 Start Time: 1400 End Time: 1500 Class Size: 3  On 01/25/24 completed Session 11 of Diabetes Prevention Program course  with [facility]. By the end of this session patients are able to complete the following objectives:   Learning Objectives: Give examples of negative thoughts that could prevent them from meeting their goals of losing weight and being more physically active.  Describe how to stop negative thoughts and talk back to them with positive thoughts.  Practice 1) stopping negative thoughts and 2) talking back to negative thoughts with positive ones.    Goals:  Record weight taken outside of class.  Track foods and beverages eaten each day in the Food and Activity Tracker, including calories and fat grams for each item.   Track activity type, minutes you were active, and distance you reached each day in the Food and Activity Tracker.  If you have any negative thoughts-write them in your Food and Activity Trackers, along with how you talked back to them. Practice stopping negative thoughts and talking back to them with positive thoughts.   Follow-Up Plan: Attend Core Session 12 next week.  Bring completed Food and Activity Tracker next week to be reviewed by Lifestyle Coach.

## 2024-01-25 NOTE — Therapy (Addendum)
 OUTPATIENT PHYSICAL THERAPY LOWER EXTREMITY TREATMENT   Patient Name: Barbara [name] MRN: [id number] DOB:1951-03-13, 73 y.o., female Today's Date: 01/25/2024  END OF SESSION:  PT End of Session - 01/25/24 0801     Visit Number 6    Number of Visits 12    Date for Recertification  02/12/24    Authorization Type [employer] MCR    Authorization Time Period 01/01/24 - 12/202/2025    Authorization - Visit Number 6    Authorization - Number of Visits 20    Progress Note Due on Visit 10    PT Start Time 0802    PT Stop Time 0843    PT Time Calculation (min) 41 min    Activity Tolerance Patient tolerated treatment well    Behavior During Therapy [facility] for tasks assessed/performed              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, nickel  03/31/2017   Anxiety    Arthritis    knees   Cancer (HCC)    basal cell- nose   GERD (gastroesophageal reflux disease)    Hyperlipidemia    Hypertension    Meningioma (HCC)    Neuromuscular disorder (HCC)    benign tremor- takes Propanolol   PONV (postoperative nausea and vomiting)    BP DROPS ALSO   Trigeminal neuralgia of right side of face    Past Surgical History:  Procedure Laterality Date   6 HOUR PH STUDY N/A 04/17/2016   Procedure: 24 HOUR PH STUDY;  Surgeon: Elspeth Deward Naval, MD;  Location: [facility];  Service: Gastroenterology;  Laterality: N/A;   CHOLECYSTECTOMY N/A 08/18/2023   Procedure: LAPAROSCOPIC CHOLECYSTECTOMY;  Surgeon: Tanda Locus, MD;  Location: [facility];  Service: General;  Laterality: N/A;   ESOPHAGEAL MANOMETRY N/A 04/17/2016   Procedure: ESOPHAGEAL MANOMETRY (EM);  Surgeon: Elspeth Deward Naval, MD;  Location: [facility];  Service: Gastroenterology;  Laterality: N/A;   ESOPHAGOGASTRODUODENOSCOPY N/A 08/16/2023   Procedure: EGD (ESOPHAGOGASTRODUODENOSCOPY);  Surgeon: Abran Norleen SAILOR, MD;  Location: [facility];  Service: Gastroenterology;  Laterality: N/A;   EXTERNAL FIXATION LEG Left  03/28/2017   Tibia   EXTERNAL FIXATION LEG Left 03/28/2017   Procedure: EXTERNAL FIXATION LEG;  Surgeon: Beverley Evalene BIRCH, MD;  Location: [facility];  Service: Orthopedics;  Laterality: Left;   EXTERNAL FIXATION REMOVAL Left 05/08/2017   Procedure: REMOVAL EXTERNAL FIXATION LEFT  LEG WITH HARDWARE REMOVAL;  Surgeon: Beverley Evalene BIRCH, MD;  Location: [facility];  Service: Orthopedics;  Laterality: Left;   fractured femur  05/2012   left - rod -screws placed   HARDWARE REMOVAL Left 04/16/2013   Procedure: REMOVAL OF HARDWARE OF LEFT KNEE (DISTAL INTERLOC SCREW);  Surgeon: Dempsey LULLA Moan, MD;  Location: [facility];  Service: Orthopedics;  Laterality: Left;   INDOCYANINE GREEN  FLUORESCENCE IMAGING (ICG) N/A 08/18/2023   Procedure: INDOCYANINE GREEN  FLUORESCENCE IMAGING (ICG);  Surgeon: Tanda Locus, MD;  Location: [facility];  Service: General;  Laterality: N/A;   KNEE SURGERY     x 3 left / x1 rt knee   LEFT HEART CATH AND CORONARY ANGIOGRAPHY N/A 09/18/2016   Procedure: Left Heart Cath and Coronary Angiography;  Surgeon: Wonda Sharper, MD;  Location: [facility] IN[facility];  Service: Cardiovascular;  Laterality: N/A;   NASAL SINUS SURGERY     2000   OTHER SURGICAL HISTORY  2014   titanium rod in left femur used for fracture repair   SHOULDER  SURGERY     bilateral shoulders    WISDOM TOOTH EXTRACTION     Patient Active Problem List   Diagnosis Date Noted   Abdominal pain 08/16/2023   Calculus of gallbladder without cholecystitis without obstruction 08/16/2023   Recurrent biliary colic 08/16/2023   Hiatal hernia 08/16/2023   Degenerative arthritis of right knee 09/28/2022   Leg pain 08/08/2022   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, nickel  03/31/2017   Closed left tibial fracture 03/28/2017   Closed fracture of left tibial plateau 03/28/2017   Nonintractable headache 01/25/2017   Visual field defect    Chest pain 09/16/2016   Essential tremor 09/16/2016   Right facial numbness 09/16/2016   Bilateral  scleritis 08/29/2016   Obesity 06/01/2016   Dyspnea on exertion 05/13/2016   Hoarseness 03/17/2016   Gastroesophageal reflux disease without esophagitis 03/17/2016   Globus sensation 03/17/2016   Gastritis 02/25/2016   Atypical chest pain 02/24/2016   Painful orthopaedic hardware 04/15/2013   Vitamin D deficiency 12/09/2009   HYPERCHOLESTEROLEMIA 12/09/2009   Essential hypertension 12/09/2009   Allergic rhinitis 12/09/2009   NEPHROLITHIASIS 12/09/2009   CERVICAL POLYP 12/09/2009   Osteoarthritis 12/09/2009   Cough variant asthma vs UACS  12/09/2009     REFERRING PROVIDER: Margeret Kotyk, MD   REFERRING DIAG: M79.606 (ICD-10-CM) - Generalized pain of knee region   THERAPY DIAG:  Chronic pain of left knee  Muscle weakness (generalized)  Chronic pain of right knee  Stiffness of right knee, not elsewhere classified  Stiffness of left knee, not elsewhere classified  Difficulty in walking, not elsewhere classified  Rationale for Evaluation and Treatment: Rehabilitation  ONSET DATE: chronic sx's ; PT order 12/25/2023  SUBJECTIVE:   SUBJECTIVE STATEMENT:  Pain since Tuesday has been about a 7 or 8. Has not been able to walk since previous session.    PERTINENT HISTORY: PMHx:  Hx of L femur and tibia fractures with multiple surgeries including hardware removal (last surgery in 2019); R knee arthroscopic surgery in the 90's ; Allergy to nickel and autoimmune disorder, osteoporosis though Pt states it has improved and may be osteopenia ; OA in knees, cervical pain, bilat shoulder surgeries,  R achilles pain due to a reaction from statins, meningioma, and hx trigeminal neuralgia   Gallbladder removal 06/25  PAIN:  L knee:  1-2/10 sitting 6-7/10 when standing up and walking R knee:  1-2/10 sitting 6-7/10 when standing up and walking   PRECAUTIONS: Other: Hx of L LE fractures/surgeries, allergy to nickel, osteoporosis   WEIGHT BEARING RESTRICTIONS: No  FALLS:   Has patient fallen in last 6 months? No  LIVING ENVIRONMENT: Lives with: lives with their spouse Lives in: House/apartment Stairs: 2 STE in garage, 4 STE B rails; no steps inside home     PLOF: Independent  PATIENT GOALS: to be able to stand without pain.  Improved walking after sitting   OBJECTIVE:  Note: Objective measures were completed at Evaluation unless otherwise noted.  DIAGNOSTIC FINDINGS:   PATIENT SURVEYS:  LEFS:  22/80  COGNITION: Overall cognitive status: Within functional limits for tasks assessed     EDEMA:  Pt wearing a customized DonJoy brace on L knee.    LOWER EXTREMITY ROM:  AROM/PROM Right eval Left eval Right AROM 01/22/24 Left AROM 01/22/24  Hip flexion      Hip extension      Hip abduction      Hip adduction      Hip internal rotation      Hip  external rotation      Knee flexion 115 121 110* 116*  Knee extension 20/17 5/3 18* 5*  Ankle dorsiflexion      Ankle plantarflexion      Ankle inversion      Ankle eversion       (Blank rows = not tested)  LOWER EXTREMITY MMT:  MMT Right eval Left eval  Hip flexion 4/5 4+/5  Hip extension    Hip abduction 4-5/ with pain Unable to tolerate resistance, painful  Hip adduction    Hip internal rotation    Hip external rotation 5/5 4-/5  Knee flexion 5/5 seated 5/5 seated  Knee extension 4/5 4-/5  Ankle dorsiflexion    Ankle plantarflexion    Ankle inversion    Ankle eversion     (Blank rows = not tested)    FUNCTIONAL TESTS:  5x STS test:  19.30 sec without UE's  GAIT: Comments:  Pt ambulates without an AD with DonJoy brace on L knee.  She has a very slow gait speed.  Increased knee IR on L and decreased flexion and extension t/o gaity cycle.                                                                                                                                 TREATMENT:   01/25/24 Edema massage  STM to quads/ITB/hamstrings  NuStep lvl 3 x8 minutes Standing marches  3x20 Standing hip abduction x10 bilaterally   01/22/24 Nustep L3x8 minutes all four extremities seat 7 ROM, Goals Bridges + ABD into red TB x12  LAQs 2# 2x10 B Seated SLRs 2x10 B 0#  Standing hip ABD red TB x10 B    PATIENT EDUCATION:  Education details: dx, exercise form, relevant anatomy, POC, and rationale of interventions.  PT answered pt's questions.   Person educated: Patient Education method: Explanation, demonstration, verbal and tactile cues, handout Education comprehension: verbalized understanding and needs further education, verbal and tactile cues required, returned demonstration  HOME EXERCISE PROGRAM: Access Code: [email or web address] URL: [email or web address] Date: 01/08/2024 Prepared by: Mose Minerva  Exercises - Seated Long Arc Quad  - 1 x daily - 7 x weekly - 2 sets - 10 reps - Supine Short Arc Quad  - 1 x daily - 7 x weekly - 2 sets - 10 reps - Seated knee extension Stretch  - 2 x daily - 7 x weekly - 1 reps - 2 minutes hold - Supine March  - 1 x daily - 7 x weekly - 2 sets - 15 reps - Seated Hip Abduction with Resistance  - 1 x daily - 4 x weekly - 2 sets - 10 reps  ASSESSMENT:  CLINICAL IMPRESSION:  Tolerated exercises well today with no significant increase in pain. Regressed exercises and utilized manual therapy to decrease hyperactive musculature in ITB/quads and decrease swelling. Responded well to manual. Exercises focused on glute/quad strengthening in weight bearing. Educated on tight musculature  and soreness contributing to knee pain. Will continue to benefit from therapy to address remaining limitations.     OBJECTIVE IMPAIRMENTS: Abnormal gait, decreased activity tolerance, decreased endurance, decreased mobility, difficulty walking, decreased ROM, decreased strength, hypomobility, impaired flexibility, and pain.   ACTIVITY LIMITATIONS: standing, squatting, stairs, transfers, and locomotion level  PARTICIPATION LIMITATIONS: meal prep,  cleaning, laundry, shopping, and community activity  PERSONAL FACTORS: Fitness, Time since onset of injury/illness/exacerbation, and 3+ comorbidities: Hx of L femur and tibia fractures with multiple surgeries, autoimmune disorder, osteoporosis though Pt states it has improved and may be osteopenia ; OA in knees, R achilles pain are also affecting patient's functional outcome.   REHAB POTENTIAL: Fair prior responses to PT, chronic condition  CLINICAL DECISION MAKING: Evolving/moderate complexity  EVALUATION COMPLEXITY: Moderate   GOALS:   SHORT TERM GOALS: Target date:  01/22/2024 Pt will be independent and compliant with HEP for improved pain, strength, ROM, and function.  Baseline: Goal status: MET 01/22/24  2.  Pt will demo improved L knee extension AROM to 0 deg for improved gait and stiffness.  Baseline:  Goal status: ONGOING 01/22/24  3.  Pt will be able to stand for 7-8 mins with < 6/10 pain. Baseline:  Goal status: ONGOING 01/22/24 3-4 minutes before knees start hurting per pt   4.  Pt will report at least a 25% improvement in pain and sx's overall.   Baseline:  Goal status: ONGOING 01/22/24   LONG TERM GOALS: Target date: 02/12/2024   Pt will report she is able to stand for longer duration to perform household chores with decreased seated rest breaks.  Baseline:  Goal status: INITIAL  2.  Pt will be able to stand for the majority of time while she is cooking without significant increased pain. Baseline:  Goal status: INITIAL  3.  Pt will be able to perform 5x STS test in < than 14 sec for improved functional LE strength and performance of transfers.  Baseline:  Goal status: INITIAL  4.  Pt will report at least a 70% improvement with her daily ambulation.   Baseline:  Goal status: INITIAL  5.  Pt will report she is able to stand 15 mins without significant increased pain.  Baseline:  Goal status: INITIAL  6.  Pt will demo improved LE strength to 5/5 in  bilat  knee extension and hip flexion and 4/5 in L hip abduction for improved performance of and tolerance with functional mobility.  Baseline:  Goal status: INITIAL   PLAN:  PT FREQUENCY: 2x/week  PT DURATION: 6 weeks  PLANNED INTERVENTIONS: [id number]- PT Re-evaluation, 97750- Physical Performance Testing, 97110-Therapeutic exercises, 97530- Therapeutic activity, [id number]- Neuromuscular re-education, 97535- Self Care, [id number]- Manual therapy, [phone]- Gait training, [phone]- Aquatic Therapy, [phone]- Electrical stimulation (unattended), [phone] (1-2 muscles), [phone] (3+ muscles)- Dry Needling, Patient/Family education, Balance training, Stair training, Taping, Cryotherapy, and Moist heat  PLAN FOR NEXT SESSION:  Plan to continue with similar exercises at rail to avoid significant pain increase.    Lili Finder, Student-PT 01/25/2024, 8:44 AM  This entire session was performed under direct supervision and direction of a licensed therapist/therapist assistant . I have personally read, edited and approve of the note as written. 8:54 AM, 01/25/24 Prentice CANDIE Stains PT, DPT Physical Therapist at [facility]

## 2024-01-28 ENCOUNTER — Encounter (HOSPITAL_BASED_OUTPATIENT_CLINIC_OR_DEPARTMENT_OTHER): Payer: Self-pay | Admitting: Physical Therapy

## 2024-01-28 ENCOUNTER — Ambulatory Visit (HOSPITAL_BASED_OUTPATIENT_CLINIC_OR_DEPARTMENT_OTHER): Admitting: Physical Therapy

## 2024-01-28 DIAGNOSIS — G8929 Other chronic pain: Secondary | ICD-10-CM

## 2024-01-28 DIAGNOSIS — M25662 Stiffness of left knee, not elsewhere classified: Secondary | ICD-10-CM

## 2024-01-28 DIAGNOSIS — M25562 Pain in left knee: Secondary | ICD-10-CM | POA: Diagnosis not present

## 2024-01-28 DIAGNOSIS — R262 Difficulty in walking, not elsewhere classified: Secondary | ICD-10-CM

## 2024-01-28 DIAGNOSIS — M6281 Muscle weakness (generalized): Secondary | ICD-10-CM

## 2024-01-28 DIAGNOSIS — M25661 Stiffness of right knee, not elsewhere classified: Secondary | ICD-10-CM

## 2024-01-28 NOTE — Therapy (Signed)
 OUTPATIENT PHYSICAL THERAPY LOWER EXTREMITY TREATMENT   Patient Name: Barbara [name] MRN: [id number] DOB:September 28, 1950, 73 y.o., female Today's Date: 01/29/2024  END OF SESSION:  PT End of Session - 01/28/24 0928     Visit Number 7    Number of Visits 12    Date for Recertification  02/12/24    Authorization Type [employer] M[employer]    Authorization Time Period 01/01/24 - 03/01/2024    Authorization - Number of Visits 20    PT Start Time 0848    PT Stop Time 0936    PT Time Calculation (min) 48 min    Activity Tolerance Patient tolerated treatment well    Behavior During Therapy [facility] for tasks assessed/performed               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, nickel  03/31/2017   Anxiety    Arthritis    knees   Cancer (HCC)    basal cell- nose   GERD (gastroesophageal reflux disease)    Hyperlipidemia    Hypertension    Meningioma (HCC)    Neuromuscular disorder (HCC)    benign tremor- takes Propanolol   PONV (postoperative nausea and vomiting)    BP DROPS ALSO   Trigeminal neuralgia of right side of face    Past Surgical History:  Procedure Laterality Date   7 HOUR PH STUDY N/A 04/17/2016   Procedure: 24 HOUR PH STUDY;  Surgeon: Elspeth Deward Naval, MD;  Location: [facility];  Service: Gastroenterology;  Laterality: N/A;   CHOLECYSTECTOMY N/A 08/18/2023   Procedure: LAPAROSCOPIC CHOLECYSTECTOMY;  Surgeon: Tanda Locus, MD;  Location: [facility];  Service: General;  Laterality: N/A;   ESOPHAGEAL MANOMETRY N/A 04/17/2016   Procedure: ESOPHAGEAL MANOMETRY (EM);  Surgeon: Elspeth Deward Naval, MD;  Location: [facility];  Service: Gastroenterology;  Laterality: N/A;   ESOPHAGOGASTRODUODENOSCOPY N/A 08/16/2023   Procedure: EGD (ESOPHAGOGASTRODUODENOSCOPY);  Surgeon: Abran Norleen SAILOR, MD;  Location: [facility];  Service: Gastroenterology;  Laterality: N/A;   EXTERNAL FIXATION LEG Left 03/28/2017   [place]ib[place]   EXTERNAL FIXATION LEG Left 03/28/2017    Procedure: EXTERNAL FIXATION LEG;  Surgeon: Beverley Evalene BIRCH, MD;  Location: [facility];  Service: Orthopedics;  Laterality: Left;   EXTERNAL FIXATION REMOVAL Left 05/08/2017   Procedure: REMOVAL EXTERNAL FIXATION LEFT  LEG WITH HARDWARE REMOVAL;  Surgeon: Beverley Evalene BIRCH, MD;  Location: [facility];  Service: Orthopedics;  Laterality: Left;   fractured femur  05/2012   left - rod -screws placed   HARDWARE REMOVAL Left 04/16/2013   Procedure: REMOVAL OF HARDWARE OF LEFT KNEE (DISTAL INTERLOC SCREW);  Surgeon: Dempsey LULLA Moan, MD;  Location: [facility];  Service: Orthopedics;  Laterality: Left;   INDOCYANINE GREEN  FLUORESCENCE IMAGING (ICG) N/A 08/18/2023   Procedure: INDOCYANINE GREEN  FLUORESCENCE IMAGING (ICG);  Surgeon: Tanda Locus, MD;  Location: [facility];  Service: General;  Laterality: N/A;   KNEE SURGERY     x 3 left / x1 rt knee   LEFT HEART CATH AND CORONARY ANGIOGRAPHY N/A 09/18/2016   Procedure: Left Heart Cath and Coronary Angiography;  Surgeon: Wonda Sharper, MD;  Location: [facility] IN[facility]AS[facility] CV LAB;  Service: Cardiovascular;  Laterality: N/A;   NASAL SINUS SURGERY     2000   OTHER SURGICAL HISTORY  2014   titanium rod in left femur used for fracture repair   SHOULDER SURGERY     bilateral shoulders    WISDOM TOOTH EXTRACTION  Patient Active Problem List   Diagnosis Date Noted   Abdominal pain 08/16/2023   Calculus of gallbladder without cholecystitis without obstruction 08/16/2023   Recurrent biliary colic 08/16/2023   Hiatal hernia 08/16/2023   Degenerative arthritis of right knee 09/28/2022   Leg pain 08/08/2022   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, nickel  03/31/2017   Closed left tibial fracture 03/28/2017   Closed fracture of left tibial plateau 03/28/2017   Nonintractable headache 01/25/2017   Visual field defect    Chest pain 09/16/2016   Essential tremor 09/16/2016   Right facial numbness 09/16/2016   Bilateral scleritis 08/29/2016   Obesity 06/01/2016   Dyspnea on  exertion 05/13/2016   Hoarseness 03/17/2016   Gastroesophageal reflux disease without esophagitis 03/17/2016   Globus sensation 03/17/2016   Gastritis 02/25/2016   Atypical chest pain 02/24/2016   Painful orthopaedic hardware 04/15/2013   Vitamin D deficiency 12/09/2009   HYPERCHOLESTEROLEMIA 12/09/2009   Essential hypertension 12/09/2009   Allergic rhinitis 12/09/2009   NEPHROLITHIASIS 12/09/2009   CERVICAL POLYP 12/09/2009   Osteoarthritis 12/09/2009   Cough variant asthma vs UACS  12/09/2009     REFERRING PROVIDER: Margeret Kotyk, MD   REFERRING DIAG: M79.606 (ICD-10-CM) - Generalized pain of knee region   THERAPY DIAG:  Chronic pain of left knee  Muscle weakness (generalized)  Chronic pain of right knee  Stiffness of right knee, not elsewhere classified  Stiffness of left knee, not elsewhere classified  Difficulty in walking, not elsewhere classified  Rationale for Evaluation and Treatment: Rehabilitation  ONSET DATE: chronic sx's ; PT order 12/25/2023  SUBJECTIVE:   SUBJECTIVE STATEMENT:  Pt states the lady who she worked with last visit was wonderful.  Pt reports having increased pain after her Tuesday PT visit which lasted until she returned to PT.  Pt reports last visit, she worked on my leg and it felt significantly better.  Pt reports she left here last treatment feeling significantly better and continues to have the pain relief.  Pt reports she didn't perform her exercises last week after her Tuesday PT due to pain.     PERTINENT HISTORY: PMHx:  Hx of L femur and tibia fractures with multiple surgeries including hardware removal (last surgery in 2019); R knee arthroscopic surgery in the 90's ; Allergy to nickel and autoimmune disorder, osteoporosis though Pt states it has improved and may be osteopenia ; OA in knees, cervical pain, bilat shoulder surgeries,  R achilles pain due to a reaction from statins, meningioma, and hx trigeminal neuralgia    Gallbladder removal 06/25  PAIN:  L knee:  2/10 sitting 4-5/10 with sit to/from stands R knee:  2/10 sitting 3-4/10 with sit to/from stands   PRECAUTIONS: Other: Hx of L LE fractures/surgeries, allergy to nickel, osteoporosis   WEIGHT BEARING RESTRICTIONS: No  FALLS:  Has patient fallen in last 6 months? No  LIVING ENVIRONMENT: Lives with: lives with their spouse Lives in: House/apartment Stairs: 2 STE in garage, 4 STE B rails; no steps inside home     PLOF: Independent  PATIENT GOALS: to be able to stand without pain.  Improved walking after sitting   OBJECTIVE:  Note: Objective measures were completed at Evaluation unless otherwise noted.  DIAGNOSTIC FINDINGS:   PATIENT SURVEYS:  LEFS:  22/80  COGNITION: Overall cognitive status: Within functional limits for tasks assessed     EDEMA:  Pt wearing a customized DonJoy brace on L knee.    LOWER EXTREMITY ROM:  AROM/PROM Right eval Left eval Right  AROM 01/22/24 Left AROM 01/22/24  Hip flexion      Hip extension      Hip abduction      Hip adduction      Hip internal rotation      Hip external rotation      Knee flexion 115 121 110* 116*  Knee extension 20/17 5/3 18* 5*  Ankle dorsiflexion      Ankle plantarflexion      Ankle inversion      Ankle eversion       (Blank rows = not tested)  LOWER EXTREMITY MMT:  MMT Right eval Left eval  Hip flexion 4/5 4+/5  Hip extension    Hip abduction 4-5/ with pain Unable to tolerate resistance, painful  Hip adduction    Hip internal rotation    Hip external rotation 5/5 4-/5  Knee flexion 5/5 seated 5/5 seated  Knee extension 4/5 4-/5  Ankle dorsiflexion    Ankle plantarflexion    Ankle inversion    Ankle eversion     (Blank rows = not tested)    FUNCTIONAL TESTS:  5x STS test:  19.30 sec without UE's  GAIT: Comments:  Pt ambulates without an AD with DonJoy brace on L knee.  She has a very slow gait speed.  Increased knee IR on L and  decreased flexion and extension t/o gaity cycle.                                                                                                                                 TREATMENT:   01/28/24 Reviewed pt presentation, HEP compliance, pain levels and response to prior treatment.  Nustep lvl 3 x 5 mins bilat UE/Les STM to R quad, HS, and ITB and L ITB in supine with bilat LE's elevated on gray bolster LAQ R:  x10, 1# x10, L:  1.5# 2x10 Seated hip abd with with GTB 2x15 Standing wt shifts on airex s/s  Standing on airex with NBOS x 1 min Sidestepping x 2 laps with UE support  01/25/24 Edema massage  STM to quads/ITB/hamstrings  NuStep lvl 3 x8 minutes Standing marches 3x20 Standing hip abduction x10 bilaterally   01/22/24 Nustep L3x8 minutes all four extremities seat 7 ROM, Goals Bridges + ABD into red TB x12  LAQs 2# 2x10 B Seated SLRs 2x10 B 0#  Standing hip ABD red TB x10 B    PATIENT EDUCATION:  Education details: dx, exercise form, relevant anatomy, POC, and rationale of interventions.  PT answered pt's questions.   Person educated: Patient Education method: Explanation, demonstration, verbal and tactile cues, handout Education comprehension: verbalized understanding and needs further education, verbal and tactile cues required, returned demonstration  HOME EXERCISE PROGRAM: Access Code: [email or web address] URL: [email or web address] Date: 01/08/2024 Prepared by: Mose Minerva  Exercises - Seated Long Arc Quad  - 1 x daily - 7 x weekly - 2 sets - 10 reps - Supine Short  Arc Quad  - 1 x daily - 7 x weekly - 2 sets - 10 reps - Seated knee extension Stretch  - 2 x daily - 7 x weekly - 1 reps - 2 minutes hold - Supine March  - 1 x daily - 7 x weekly - 2 sets - 15 reps - Seated Hip Abduction with Resistance  - 1 x daily - 4 x weekly - 2 sets - 10 reps  ASSESSMENT:  CLINICAL IMPRESSION:  Pt responded well to prior treatment with soft tissue work reporting  significantly improved pain.  PT performed STM R > L LE and pt had tenderness in R quad and bilat ITB.  Pt performed exercises focused on improving LE strength, proprioception, gait, and stability.  She tolerated exercises and MT well.  Pt reports no increased pain after treatment, just a little more soreness in her knees after treatment.  She should benefit from cont skilled PT to address impairments and goals to improve functional mobility.   OBJECTIVE IMPAIRMENTS: Abnormal gait, decreased activity tolerance, decreased endurance, decreased mobility, difficulty walking, decreased ROM, decreased strength, hypomobility, impaired flexibility, and pain.   ACTIVITY LIMITATIONS: standing, squatting, stairs, transfers, and locomotion level  PARTICIPATION LIMITATIONS: meal prep, cleaning, laundry, shopping, and community activity  PERSONAL FACTORS: Fitness, Time since onset of injury/illness/exacerbation, and 3+ comorbidities: Hx of L femur and tibia fractures with multiple surgeries, autoimmune disorder, osteoporosis though Pt states it has improved and may be osteopenia ; OA in knees, R achilles pain are also affecting patient's functional outcome.   REHAB POTENTIAL: Fair prior responses to PT, chronic condition  CLINICAL DECISION MAKING: Evolving/moderate complexity  EVALUATION COMPLEXITY: Moderate   GOALS:   SHORT TERM GOALS: Target date:  01/22/2024 Pt will be independent and compliant with HEP for improved pain, strength, ROM, and function.  Baseline: Goal status: MET 01/22/24  2.  Pt will demo improved L knee extension AROM to 0 deg for improved gait and stiffness.  Baseline:  Goal status: ONGOING 01/22/24  3.  Pt will be able to stand for 7-8 mins with < 6/10 pain. Baseline:  Goal status: ONGOING 01/22/24 3-4 minutes before knees start hurting per pt   4.  Pt will report at least a 25% improvement in pain and sx's overall.   Baseline:  Goal status: ONGOING 01/22/24   LONG TERM  GOALS: Target date: 02/12/2024   Pt will report she is able to stand for longer duration to perform household chores with decreased seated rest breaks.  Baseline:  Goal status: INITIAL  2.  Pt will be able to stand for the majority of time while she is cooking without significant increased pain. Baseline:  Goal status: INITIAL  3.  Pt will be able to perform 5x STS test in < than 14 sec for improved functional LE strength and performance of transfers.  Baseline:  Goal status: INITIAL  4.  Pt will report at least a 70% improvement with her daily ambulation.   Baseline:  Goal status: INITIAL  5.  Pt will report she is able to stand 15 mins without significant increased pain.  Baseline:  Goal status: INITIAL  6.  Pt will demo improved LE strength to 5/5 in bilat  knee extension and hip flexion and 4/5 in L hip abduction for improved performance of and tolerance with functional mobility.  Baseline:  Goal status: INITIAL   PLAN:  PT FREQUENCY: 2x/week  PT DURATION: 6 weeks  PLANNED INTERVENTIONS: 02835- PT Re-evaluation, 97750-  Physical Performance Testing, 97110-Therapeutic exercises, 97530- Therapeutic activity, [id number]- Neuromuscular re-education, [phone]- Self Care, 02859- Manual therapy, [phone]- Gait training, [phone]- Aquatic Therapy, [phone]- Electrical stimulation (unattended), [phone] (1-2 muscles), [phone] (3+ muscles)- Dry Needling, Patient/Family education, Balance training, Stair training, Taping, Cryotherapy, and Moist heat  PLAN FOR NEXT SESSION:  Plan to continue with similar exercises at rail to avoid significant pain increase.  Cont with STM, gait, and mobility training.  Pt has a nickel allergy.     Leigh Minerva III PT, DPT 01/29/24 9:35 AM

## 2024-01-30 ENCOUNTER — Encounter (HOSPITAL_BASED_OUTPATIENT_CLINIC_OR_DEPARTMENT_OTHER): Payer: Self-pay

## 2024-01-30 ENCOUNTER — Ambulatory Visit (HOSPITAL_BASED_OUTPATIENT_CLINIC_OR_DEPARTMENT_OTHER): Admitting: Physical Therapy

## 2024-02-13 ENCOUNTER — Ambulatory Visit (HOSPITAL_BASED_OUTPATIENT_CLINIC_OR_DEPARTMENT_OTHER): Admitting: Physical Therapy

## 2024-02-15 ENCOUNTER — Ambulatory Visit (HOSPITAL_BASED_OUTPATIENT_CLINIC_OR_DEPARTMENT_OTHER): Admitting: Physical Therapy

## 2024-02-15 ENCOUNTER — Encounter: Payer: Self-pay | Admitting: Dietician

## 2024-02-15 ENCOUNTER — Encounter: Attending: Family Medicine | Admitting: Dietician

## 2024-02-15 ENCOUNTER — Encounter (HOSPITAL_BASED_OUTPATIENT_CLINIC_OR_DEPARTMENT_OTHER): Payer: Self-pay | Admitting: Physical Therapy

## 2024-02-15 DIAGNOSIS — M25662 Stiffness of left knee, not elsewhere classified: Secondary | ICD-10-CM | POA: Diagnosis present

## 2024-02-15 DIAGNOSIS — M25562 Pain in left knee: Secondary | ICD-10-CM | POA: Diagnosis present

## 2024-02-15 DIAGNOSIS — R262 Difficulty in walking, not elsewhere classified: Secondary | ICD-10-CM | POA: Insufficient documentation

## 2024-02-15 DIAGNOSIS — M25561 Pain in right knee: Secondary | ICD-10-CM | POA: Insufficient documentation

## 2024-02-15 DIAGNOSIS — M25661 Stiffness of right knee, not elsewhere classified: Secondary | ICD-10-CM | POA: Insufficient documentation

## 2024-02-15 DIAGNOSIS — M79604 Pain in right leg: Secondary | ICD-10-CM | POA: Insufficient documentation

## 2024-02-15 DIAGNOSIS — G8929 Other chronic pain: Secondary | ICD-10-CM | POA: Insufficient documentation

## 2024-02-15 DIAGNOSIS — R7303 Prediabetes: Secondary | ICD-10-CM | POA: Diagnosis not present

## 2024-02-15 DIAGNOSIS — M6281 Muscle weakness (generalized): Secondary | ICD-10-CM | POA: Diagnosis present

## 2024-02-15 NOTE — Therapy (Addendum)
 OUTPATIENT PHYSICAL THERAPY LOWER EXTREMITY TREATMENT  Progress Note Reporting Period 01/01/2024 to 02/15/2024  See note below for Objective Data and Assessment of Progress/Goals.       Patient Name: Barbara [name] MRN: [id number] DOB:[birth year], 73 y.o., female Today's Date: 02/15/2024  END OF SESSION:  PT End of Session - 02/15/24 0939     Visit Number 8    Number of Visits 16    Date for Recertification  03/14/24    Authorization Type [employer] M[employer]    Authorization Time Period 01/01/24 - 03/01/2024    Authorization - Number of Visits 20    PT Start Time 0936    PT Stop Time 1022    PT Time Calculation (min) 46 min    Activity Tolerance Patient tolerated treatment well    Behavior During Therapy [facility] for tasks assessed/performed               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, nickel  03/31/2017   Anxiety    Arthritis    knees   Cancer (HCC)    basal cell- nose   GERD (gastroesophageal reflux disease)    Hyperlipidemia    Hypertension    Meningioma (HCC)    Neuromuscular disorder (HCC)    benign tremor- takes Propanolol   PONV (postoperative nausea and vomiting)    BP DROPS ALSO   Trigeminal neuralgia of right side of face    Past Surgical History:  Procedure Laterality Date   38 HOUR PH STUDY N/A 04/17/2016   Procedure: 24 HOUR PH STUDY;  Surgeon: Elspeth Deward Naval, MD;  Location: [facility];  Service: Gastroenterology;  Laterality: N/A;   CHOLECYSTECTOMY N/A 08/18/2023   Procedure: LAPAROSCOPIC CHOLECYSTECTOMY;  Surgeon: Tanda Locus, MD;  Location: [facility];  Service: General;  Laterality: N/A;   ESOPHAGEAL MANOMETRY N/A 04/17/2016   Procedure: ESOPHAGEAL MANOMETRY (EM);  Surgeon: Elspeth Deward Naval, MD;  Location: [facility];  Service: Gastroenterology;  Laterality: N/A;   ESOPHAGOGASTRODUODENOSCOPY N/A 08/16/2023   Procedure: EGD (ESOPHAGOGASTRODUODENOSCOPY);  Surgeon: Abran Norleen SAILOR, MD;  Location: [facility];   Service: Gastroenterology;  Laterality: N/A;   EXTERNAL FIXATION LEG Left 03/28/2017   Tibia   EXTERNAL FIXATION LEG Left 03/28/2017   Procedure: EXTERNAL FIXATION LEG;  Surgeon: Beverley Evalene BIRCH, MD;  Location: [facility];  Service: Orthopedics;  Laterality: Left;   EXTERNAL FIXATION REMOVAL Left 05/08/2017   Procedure: REMOVAL EXTERNAL FIXATION LEFT  LEG WITH HARDWARE REMOVAL;  Surgeon: Beverley Evalene BIRCH, MD;  Location: [facility];  Service: Orthopedics;  Laterality: Left;   fractured femur  05/2012   left - rod -screws placed   HARDWARE REMOVAL Left 04/16/2013   Procedure: REMOVAL OF HARDWARE OF LEFT KNEE (DISTAL INTERLOC SCREW);  Surgeon: Dempsey LULLA Moan, MD;  Location: [facility];  Service: Orthopedics;  Laterality: Left;   INDOCYANINE GREEN  FLUORESCENCE IMAGING (ICG) N/A 08/18/2023   Procedure: INDOCYANINE GREEN  FLUORESCENCE IMAGING (ICG);  Surgeon: Tanda Locus, MD;  Location: [facility];  Service: General;  Laterality: N/A;   KNEE SURGERY     x 3 left / x1 rt knee   LEFT HEART CATH AND CORONARY ANGIOGRAPHY N/A 09/18/2016   Procedure: Left Heart Cath and Coronary Angiography;  Surgeon: Wonda Sharper, MD;  Location: [facility] INVASIVE CV LAB;  Service: Cardiovascular;  Laterality: N/A;   NASAL SINUS SURGERY     2000   OTHER SURGICAL HISTORY  2014   titanium rod in left femur  used for fracture repair   SHOULDER SURGERY     bilateral shoulders    WISDOM TOOTH EXTRACTION     Patient Active Problem List   Diagnosis Date Noted   Abdominal pain 08/16/2023   Calculus of gallbladder without cholecystitis without obstruction 08/16/2023   Recurrent biliary colic 08/16/2023   Hiatal hernia 08/16/2023   Degenerative arthritis of right knee 09/28/2022   Leg pain 08/08/2022   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, nickel  03/31/2017   Closed left tibial fracture 03/28/2017   Closed fracture of left tibial plateau 03/28/2017   Nonintractable headache 01/25/2017   Visual field defect    Chest pain 09/16/2016    Essential tremor 09/16/2016   Right facial numbness 09/16/2016   Bilateral scleritis 08/29/2016   Obesity 06/01/2016   Dyspnea on exertion 05/13/2016   Hoarseness 03/17/2016   Gastroesophageal reflux disease without esophagitis 03/17/2016   Globus sensation 03/17/2016   Gastritis 02/25/2016   Atypical chest pain 02/24/2016   Painful orthopaedic hardware 04/15/2013   Vitamin D deficiency 12/09/2009   HYPERCHOLESTEROLEMIA 12/09/2009   Essential hypertension 12/09/2009   Allergic rhinitis 12/09/2009   NEPHROLITHIASIS 12/09/2009   CERVICAL POLYP 12/09/2009   Osteoarthritis 12/09/2009   Cough variant asthma vs UACS  12/09/2009     REFERRING PROVIDER: Margeret Kotyk, MD   REFERRING DIAG: M79.606 (ICD-10-CM) - Generalized pain of knee region   THERAPY DIAG:  Chronic pain of left knee  Muscle weakness (generalized)  Chronic pain of right knee  Stiffness of right knee, not elsewhere classified  Stiffness of left knee, not elsewhere classified  Difficulty in walking, not elsewhere classified  Rationale for Evaluation and Treatment: Rehabilitation  ONSET DATE: chronic sx's ; PT order 12/25/2023  SUBJECTIVE:   SUBJECTIVE STATEMENT:  Pt states she has been gone for 10 days for the Thanksgiving holiday.  Pt states the soft tissue work improved her pain last treatment.  Pt reports improved strength in R LE.  Pt reports improved R knee pain with standing.  3-4/10 pain in R knee with standing 2-3 mins and 7-8/10 in L knee.  Pt reports a little improvement in her walking distance.  She did a lot of walking in [place]  and was sore the next day.  Pt reported a 30% improvement in pain and sx's overall.  She sees more improvement in R LE than L LE.  Pt reports hardly any improvement with standing to cook.  Pt states her walking is maybe marginally better.  Pt has difficulty with walking after sitting awhile.      PERTINENT HISTORY: PMHx:  Hx of L femur and tibia fractures with multiple  surgeries including hardware removal (last surgery in 2019); R knee arthroscopic surgery in the 90's ; Allergy to nickel and autoimmune disorder, osteoporosis though Pt states it has improved and may be osteopenia ; OA in knees, cervical pain, bilat shoulder surgeries,  R achilles pain due to a reaction from statins, meningioma, and hx trigeminal neuralgia   Gallbladder removal 06/25  PAIN:  L knee:  2-310 current, 7-8/10 worst, 2/10 best  R knee:  2-3/10 current, 7-8/10 worst, 2/10 best    PRECAUTIONS: Other: Hx of L LE fractures/surgeries, allergy to nickel, osteoporosis   WEIGHT BEARING RESTRICTIONS: No  FALLS:  Has patient fallen in last 6 months? No  LIVING ENVIRONMENT: Lives with: lives with their spouse Lives in: House/apartment Stairs: 2 STE in garage, 4 STE B rails; no steps inside home     PLOF: Independent  PATIENT  GOALS: to be able to stand without pain.  Improved walking after sitting   OBJECTIVE:  Note: Objective measures were completed at Evaluation unless otherwise noted.  DIAGNOSTIC FINDINGS:   PATIENT SURVEYS:  LEFS:  22/80 (Eval)  23/80 (02/15/24)  COGNITION: Overall cognitive status: Within functional limits for tasks assessed       LOWER EXTREMITY ROM:  AROM/PROM Right eval Left eval Right AROM 01/22/24 Left AROM 01/22/24 Right 12/5 Left 12/5  Hip flexion        Hip extension        Hip abduction        Hip adduction        Hip internal rotation        Hip external rotation        Knee flexion 115 121 110* 116* 121 with 4/10 pain 117 deg with 3/10 pain  Knee extension 20/17 5/3 18* 5* 18 deg with 4/10 pain 6 deg with 3/10 pain  Ankle dorsiflexion        Ankle plantarflexion        Ankle inversion        Ankle eversion         (Blank rows = not tested)  LOWER EXTREMITY MMT:  MMT Right eval Left eval Right 12/5 Left 12/5  Hip flexion 4/5 4+/5 4+/5 5/5  Hip extension      Hip abduction 4-5/ with pain Unable to tolerate  resistance, painful 4+/5 with pain 3+/5  Hip adduction      Hip internal rotation      Hip external rotation 5/5 4-/5  4+/5  Knee flexion 5/5 seated 5/5 seated    Knee extension 4/5 4-/5 4/5 4-/5  Ankle dorsiflexion      Ankle plantarflexion      Ankle inversion      Ankle eversion       (Blank rows = not tested)    FUNCTIONAL TESTS:  5x STS test:  19.30 sec without Ue's (Initial) / 19 sec without Ue's  (02/15/24) increased R > L knee pain   GAIT: Comments:  She has a very slow gait speed.  Increased knee IR on L and decreased flexion and extension t/o gaity cycle.                                                                                                                                 TREATMENT:   02/15/2024 Reviewed current function, pain levels, response to prior treatment, and HEP compliance.  Nustep lvl 3 bilat UE/LE's x 6 mins Assessed knee ROM, LE strength, and 5x STS test.  See above. Pt completed LEFS.  See above  STM to L quad and ITB in supine with bilat LE's elevated on gray bolster     01/28/24 Reviewed pt presentation, HEP compliance, pain levels and response to prior treatment.  Nustep lvl 3 x 5 mins bilat UE/Les STM to R quad, HS,  and ITB and L ITB in supine with bilat LE's elevated on gray bolster LAQ R:  x10, 1# x10, L:  1.5# 2x10 Seated hip abd with with GTB 2x15 Standing wt shifts on airex s/s  Standing on airex with NBOS x 1 min Sidestepping x 2 laps with UE support  01/25/24 Edema massage  STM to quads/ITB/hamstrings  NuStep lvl 3 x8 minutes Standing marches 3x20 Standing hip abduction x10 bilaterally   01/22/24 Nustep L3x8 minutes all four extremities seat 7 ROM, Goals Bridges + ABD into red TB x12  LAQs 2# 2x10 B Seated SLRs 2x10 B 0#  Standing hip ABD red TB x10 B    PATIENT EDUCATION:  Education details: dx, exercise form, relevant anatomy, POC, and rationale of interventions.  PT answered pt's questions.   Person educated:  Patient Education method: Explanation, demonstration, verbal and tactile cues, handout Education comprehension: verbalized understanding and needs further education, verbal and tactile cues required, returned demonstration  HOME EXERCISE PROGRAM: Access Code: [email or web address] URL: [email or web address] Date: 01/08/2024 Prepared by: Mose Minerva  Exercises - Seated Long Arc Quad  - 1 x daily - 7 x weekly - 2 sets - 10 reps - Supine Short Arc Quad  - 1 x daily - 7 x weekly - 2 sets - 10 reps - Seated knee extension Stretch  - 2 x daily - 7 x weekly - 1 reps - 2 minutes hold - Supine March  - 1 x daily - 7 x weekly - 2 sets - 15 reps - Seated Hip Abduction with Resistance  - 1 x daily - 4 x weekly - 2 sets - 10 reps  ASSESSMENT:  CLINICAL IMPRESSION:  Pt presented to treatment without her cane stating she forgot her cane.  Pt reported a 30% improvement in pain and sx's overall and sees more improvement in R LE than L LE.  Pt reports improved R knee pain with standing and no change in L knee pain with standing.  She continues to have difficulty with walking and limitations with standing duration.   Pt reports a little improvement in her walking distance.  She continues to be very limited with standing activities in the kitchen.  Pt reports improved pain with soft tissue work.  Pt demonstrates improved bilat hip flex and abd strength and L hip ER strength.  Pt had no change in quad strength.  She continues to have limited extension in bilat knees R > L.  She has significant deficits in R knee extension ROM and improved R knee flexion ROM.  Pt had no change in 5x STS test.  Pt had slight improvement in LEFS by only 1 point which was not clinically significant.  Pt met STG's #1,4 and none of her LTG's.  Pt reports feeling much better after STM and states her L LE feels more stable.  She also reports improved walking after STM.  Pt should benefit from cont skilled PT to address impairments and  goals to improve functional mobility.       OBJECTIVE IMPAIRMENTS: Abnormal gait, decreased activity tolerance, decreased endurance, decreased mobility, difficulty walking, decreased ROM, decreased strength, hypomobility, impaired flexibility, and pain.   ACTIVITY LIMITATIONS: standing, squatting, stairs, transfers, and locomotion level  PARTICIPATION LIMITATIONS: meal prep, cleaning, laundry, shopping, and community activity  PERSONAL FACTORS: Fitness, Time since onset of injury/illness/exacerbation, and 3+ comorbidities: Hx of L femur and tibia fractures with multiple surgeries, autoimmune disorder, osteoporosis though Pt states it has improved and may be osteopenia ;  OA in knees, R achilles pain are also affecting patient's functional outcome.   REHAB POTENTIAL: Fair prior responses to PT, chronic condition  CLINICAL DECISION MAKING: Evolving/moderate complexity  EVALUATION COMPLEXITY: Moderate   GOALS:   SHORT TERM GOALS: Target date:  01/22/2024 Pt will be independent and compliant with HEP for improved pain, strength, ROM, and function.  Baseline: Goal status: MET 01/22/24  2.  Pt will demo improved L knee extension AROM to 0 deg for improved gait and stiffness.  Baseline:  Goal status: NOT MET   3.  Pt will be able to stand for 7-8 mins with < 6/10 pain. Baseline:  Goal status: NOT MET  4.  Pt will report at least a 25% improvement in pain and sx's overall.   Baseline:  Goal status: GOAL MET  02/15/24   LONG TERM GOALS: Target date: 03/14/2024   Pt will report she is able to stand for longer duration to perform household chores with decreased seated rest breaks.  Baseline:  Goal status: ONGOING  2.  Pt will be able to stand for the majority of time while she is cooking without significant increased pain. Baseline:  Goal status: ONGOING  3.  Pt will be able to perform 5x STS test in < than 14 sec for improved functional LE strength and performance of transfers.   Baseline:  Goal status: NOT MET    4.  Pt will report at least a 70% improvement with her daily ambulation.   Baseline:  Goal status: ONGOING  5.  Pt will report she is able to stand 15 mins without significant increased pain.  Baseline:  Goal status: ONGOING  6.  Pt will demo improved LE strength to 5/5 in bilat  knee extension and hip flexion and 4/5 in L hip abduction for improved performance of and tolerance with functional mobility.  Baseline:  Goal status: ONGOING   PLAN:  PT FREQUENCY: 2x/week  PT DURATION: 4 weeks  PLANNED INTERVENTIONS: 97164- PT Re-evaluation, 97750- Physical Performance Testing, 97110-Therapeutic exercises, 97530- Therapeutic activity, [id number]- Neuromuscular re-education, 97535- Self Care, [id number]- Manual therapy, [id number]- Gait training, [phone]- Aquatic Therapy, [phone]- Electrical stimulation (unattended), 20560 (1-2 muscles), 20561 (3+ muscles)- Dry Needling, Patient/Family education, Balance training, Stair training, Taping, Cryotherapy, and Moist heat  PLAN FOR NEXT SESSION:  Cont with STM, gait, ROM, strengthening, and mobility training.  Pt has a nickel allergy.     Leigh Minerva III PT, DPT 02/15/24 11:43 PM

## 2024-02-15 NOTE — Progress Notes (Signed)
 Class start Time: 1400   Class End Time: 1500  This was a class of 3 patients.  Patient was seen on 02/15/24 for the Core Session 12 of Diabetes Prevention Program course at [facility]. By the end of this session patients are able to complete the following objectives:   Learning Objectives: Describe their current progress toward defined goals. Describe common causes for slipping from healthy eating or being active. Explain what to do to get back on their feet after a slip.  Goals:  Record weight taken outside of class.  Track foods and beverages eaten each day in the Food and Activity Tracker, including calories and fat grams for each item.   Track activity type, minutes active, and distance reached each day in the Food and Activity Tracker.  Try out the two action plans created during session- "Slips from Healthy Eating: Action Plan" and "Slips from Being Active: Action Plan" Answer questions on the handout.   Follow-Up Plan: Attend Core Session 13 next week.  Bring completed Food and Activity Tracker next week to be reviewed by Lifestyle Coach.

## 2024-02-19 ENCOUNTER — Encounter (HOSPITAL_BASED_OUTPATIENT_CLINIC_OR_DEPARTMENT_OTHER): Payer: Self-pay | Admitting: Physical Therapy

## 2024-02-19 ENCOUNTER — Ambulatory Visit (HOSPITAL_BASED_OUTPATIENT_CLINIC_OR_DEPARTMENT_OTHER): Admitting: Physical Therapy

## 2024-02-19 DIAGNOSIS — R262 Difficulty in walking, not elsewhere classified: Secondary | ICD-10-CM

## 2024-02-19 DIAGNOSIS — M25662 Stiffness of left knee, not elsewhere classified: Secondary | ICD-10-CM

## 2024-02-19 DIAGNOSIS — M6281 Muscle weakness (generalized): Secondary | ICD-10-CM

## 2024-02-19 DIAGNOSIS — M79604 Pain in right leg: Secondary | ICD-10-CM

## 2024-02-19 DIAGNOSIS — M25661 Stiffness of right knee, not elsewhere classified: Secondary | ICD-10-CM

## 2024-02-19 DIAGNOSIS — M25562 Pain in left knee: Secondary | ICD-10-CM | POA: Diagnosis not present

## 2024-02-19 DIAGNOSIS — G8929 Other chronic pain: Secondary | ICD-10-CM

## 2024-02-19 NOTE — Therapy (Signed)
 OUTPATIENT PHYSICAL THERAPY LOWER EXTREMITY TREATMENT        Patient Name: Barbara [name] MRN: [id number] DOB:04/28/1950, 73 y.o., female Today's Date: 02/19/2024  END OF SESSION:  PT End of Session - 02/19/24 1449     Visit Number 9    Number of Visits 16    Date for Recertification  03/14/24    Authorization Type [employer] MCR    Authorization Time Period 01/01/24 - 03/01/2024    Authorization - Number of Visits 20    PT Start Time 1433    PT Stop Time 1513    PT Time Calculation (min) 40 min    Activity Tolerance Patient tolerated treatment well    Behavior During Therapy [facility] for tasks assessed/performed                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, nickel  03/31/2017   Anxiety    Arthritis    knees   Cancer (HCC)    basal cell- nose   GERD (gastroesophageal reflux disease)    Hyperlipidemia    Hypertension    Meningioma (HCC)    Neuromuscular disorder (HCC)    benign tremor- takes Propanolol   PONV (postoperative nausea and vomiting)    BP DROPS ALSO   Trigeminal neuralgia of right side of face    Past Surgical History:  Procedure Laterality Date   45 HOUR PH STUDY N/A 04/17/2016   Procedure: 24 HOUR PH STUDY;  Surgeon: Elspeth Deward Naval, MD;  Location: [facility];  Service: Gastroenterology;  Laterality: N/A;   CHOLECYSTECTOMY N/A 08/18/2023   Procedure: LAPAROSCOPIC CHOLECYSTECTOMY;  Surgeon: Tanda Locus, MD;  Location: [facility];  Service: General;  Laterality: N/A;   ESOPHAGEAL MANOMETRY N/A 04/17/2016   Procedure: ESOPHAGEAL MANOMETRY (EM);  Surgeon: Elspeth Deward Naval, MD;  Location: [facility];  Service: Gastroenterology;  Laterality: N/A;   ESOPHAGOGASTRODUODENOSCOPY N/A 08/16/2023   Procedure: EGD (ESOPHAGOGASTRODUODENOSCOPY);  Surgeon: Abran Norleen SAILOR, MD;  Location: [facility];  Service: Gastroenterology;  Laterality: N/A;   EXTERNAL FIXATION LEG Left 03/28/2017   [place]   EXTERNAL FIXATION LEG Left  03/28/2017   Procedure: EXTERNAL FIXATION LEG;  Surgeon: Beverley Evalene BIRCH, MD;  Location: [facility];  Service: Orthopedics;  Laterality: Left;   EXTERNAL FIXATION REMOVAL Left 05/08/2017   Procedure: REMOVAL EXTERNAL FIXATION LEFT  LEG WITH HARDWARE REMOVAL;  Surgeon: Beverley Evalene BIRCH, MD;  Location: [facility];  Service: Orthopedics;  Laterality: Left;   fractured femur  05/2012   left - rod -screws placed   HARDWARE REMOVAL Left 04/16/2013   Procedure: REMOVAL OF HARDWARE OF LEFT KNEE (DISTAL INTERLOC SCREW);  Surgeon: Dempsey LULLA Moan, MD;  Location: [facility];  Service: Orthopedics;  Laterality: Left;   INDOCYANINE GREEN  FLUORESCENCE IMAGING (ICG) N/A 08/18/2023   Procedure: INDOCYANINE GREEN  FLUORESCENCE IMAGING (ICG);  Surgeon: Tanda Locus, MD;  Location: [facility];  Service: General;  Laterality: N/A;   KNEE SURGERY     x 3 left / x1 rt knee   LEFT HEART CATH AND CORONARY ANGIOGRAPHY N/A 09/18/2016   Procedure: Left Heart Cath and Coronary Angiography;  Surgeon: Wonda Sharper, MD;  Location: [facility] INVASIVE CV LAB;  Service: Cardiovascular;  Laterality: N/A;   NASAL SINUS SURGERY     2000   OTHER SURGICAL HISTORY  2014   titanium rod in left femur used for fracture repair   SHOULDER SURGERY     bilateral shoulders  WISDOM TOOTH EXTRACTION     Patient Active Problem List   Diagnosis Date Noted   Abdominal pain 08/16/2023   Calculus of gallbladder without cholecystitis without obstruction 08/16/2023   Recurrent biliary colic 08/16/2023   Hiatal hernia 08/16/2023   Degenerative arthritis of right knee 09/28/2022   Leg pain 08/08/2022   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, nickel  03/31/2017   Closed left tibial fracture 03/28/2017   Closed fracture of left tibial plateau 03/28/2017   Nonintractable headache 01/25/2017   Visual field defect    Chest pain 09/16/2016   Essential tremor 09/16/2016   Right facial numbness 09/16/2016   Bilateral scleritis 08/29/2016   Obesity 06/01/2016    Dyspnea on exertion 05/13/2016   Hoarseness 03/17/2016   Gastroesophageal reflux disease without esophagitis 03/17/2016   Globus sensation 03/17/2016   Gastritis 02/25/2016   Atypical chest pain 02/24/2016   Painful orthopaedic hardware 04/15/2013   Vitamin D deficiency 12/09/2009   HYPERCHOLESTEROLEMIA 12/09/2009   Essential hypertension 12/09/2009   Allergic rhinitis 12/09/2009   NEPHROLITHIASIS 12/09/2009   CERVICAL POLYP 12/09/2009   Osteoarthritis 12/09/2009   Cough variant asthma vs UACS  12/09/2009     REFERRING PROVIDER: Margeret Kotyk, MD   REFERRING DIAG: M79.606 (ICD-10-CM) - Generalized pain of knee region   THERAPY DIAG:  Chronic pain of left knee  Muscle weakness (generalized)  Chronic pain of right knee  Stiffness of right knee, not elsewhere classified  Stiffness of left knee, not elsewhere classified  Difficulty in walking, not elsewhere classified  Pain in right leg  Rationale for Evaluation and Treatment: Rehabilitation  ONSET DATE: chronic sx's ; PT order 12/25/2023  SUBJECTIVE:   SUBJECTIVE STATEMENT:  I'm not 100% but I'm here. Last time I saw you specifically I was really sore to the point I couldn't move the next day. Deep tissue massages have been really helpful.      PERTINENT HISTORY: PMHx:  Hx of L femur and tibia fractures with multiple surgeries including hardware removal (last surgery in 2019); R knee arthroscopic surgery in the 90's ; Allergy to nickel and autoimmune disorder, osteoporosis though Pt states it has improved and may be osteopenia ; OA in knees, cervical pain, bilat shoulder surgeries,  R achilles pain due to a reaction from statins, meningioma, and hx trigeminal neuralgia   Gallbladder removal 06/25  PAIN:  B legs/knees 2/10 at rest, 3-4 in standing/with walking Pain is mostly in knees, some skin soreness from massage Massage helps the most Overdoing and being up on feet makes it worse    PRECAUTIONS: Other:  Hx of L LE fractures/surgeries, allergy to nickel, osteoporosis   WEIGHT BEARING RESTRICTIONS: No  FALLS:  Has patient fallen in last 6 months? No  LIVING ENVIRONMENT: Lives with: lives with their spouse Lives in: House/apartment Stairs: 2 STE in garage, 4 STE B rails; no steps inside home     PLOF: Independent  PATIENT GOALS: to be able to stand without pain.  Improved walking after sitting   OBJECTIVE:  Note: Objective measures were completed at Evaluation unless otherwise noted.  DIAGNOSTIC FINDINGS:   PATIENT SURVEYS:  LEFS:  22/80 (Eval)  23/80 (02/15/24)  COGNITION: Overall cognitive status: Within functional limits for tasks assessed       LOWER EXTREMITY ROM:  AROM/PROM Right eval Left eval Right AROM 01/22/24 Left AROM 01/22/24 Right 12/5 Left 12/5  Hip flexion        Hip extension        Hip abduction  Hip adduction        Hip internal rotation        Hip external rotation        Knee flexion 115 121 110* 116* 121 with 4/10 pain 117 deg with 3/10 pain  Knee extension 20/17 5/3 18* 5* 18 deg with 4/10 pain 6 deg with 3/10 pain  Ankle dorsiflexion        Ankle plantarflexion        Ankle inversion        Ankle eversion         (Blank rows = not tested)  LOWER EXTREMITY MMT:  MMT Right eval Left eval Right 12/5 Left 12/5  Hip flexion 4/5 4+/5 4+/5 5/5  Hip extension      Hip abduction 4-5/ with pain Unable to tolerate resistance, painful 4+/5 with pain 3+/5  Hip adduction      Hip internal rotation      Hip external rotation 5/5 4-/5  4+/5  Knee flexion 5/5 seated 5/5 seated    Knee extension 4/5 4-/5 4/5 4-/5  Ankle dorsiflexion      Ankle plantarflexion      Ankle inversion      Ankle eversion       (Blank rows = not tested)    FUNCTIONAL TESTS:  5x STS test:  19.30 sec without Ue's (Initial) / 19 sec without Ue's  (02/15/24) increased R > L knee pain   GAIT: Comments:  She has a very slow gait speed.  Increased knee IR  on L and decreased flexion and extension t/o gaity cycle.                                                                                                                                 TREATMENT:    02/19/24  Nustep L3x8 minutes seat 9 all four extremities  Lateral wt shifts on blue foam x2 min, minimal use of UEs  Wide tandem blue foam 4x30 seconds alternating   LAQs 1# x10 B, 1.5# 2x10 B Seated clams green TB 2x15   Tennis ball massage R lateral thigh and HS proximal attachment points     02/15/2024 Reviewed current function, pain levels, response to prior treatment, and HEP compliance.  Nustep lvl 3 bilat UE/LE's x 6 mins Assessed knee ROM, LE strength, and 5x STS test.  See above. Pt completed LEFS.  See above  STM to L quad and ITB in supine with bilat LE's elevated on gray bolster     01/28/24 Reviewed pt presentation, HEP compliance, pain levels and response to prior treatment.  Nustep lvl 3 x 5 mins bilat UE/Les STM to R quad, HS, and ITB and L ITB in supine with bilat LE's elevated on gray bolster LAQ R:  x10, 1# x10, L:  1.5# 2x10 Seated hip abd with with GTB 2x15 Standing wt shifts on airex s/s  Standing on airex with NBOS  x 1 min Sidestepping x 2 laps with UE support  01/25/24 Edema massage  STM to quads/ITB/hamstrings  NuStep lvl 3 x8 minutes Standing marches 3x20 Standing hip abduction x10 bilaterally   01/22/24 Nustep L3x8 minutes all four extremities seat 7 ROM, Goals Bridges + ABD into red TB x12  LAQs 2# 2x10 B Seated SLRs 2x10 B 0#  Standing hip ABD red TB x10 B    PATIENT EDUCATION:  Education details: dx, exercise form, relevant anatomy, POC, and rationale of interventions.  PT answered pt's questions.   Person educated: Patient Education method: Explanation, demonstration, verbal and tactile cues, handout Education comprehension: verbalized understanding and needs further education, verbal and tactile cues required, returned  demonstration  HOME EXERCISE PROGRAM: Access Code: [email or web address] URL: [email or web address] Date: 01/08/2024 Prepared by: Mose Minerva  Exercises - Seated Long Arc Quad  - 1 x daily - 7 x weekly - 2 sets - 10 reps - Supine Short Arc Quad  - 1 x daily - 7 x weekly - 2 sets - 10 reps - Seated knee extension Stretch  - 2 x daily - 7 x weekly - 1 reps - 2 minutes hold - Supine March  - 1 x daily - 7 x weekly - 2 sets - 15 reps - Seated Hip Abduction with Resistance  - 1 x daily - 4 x weekly - 2 sets - 10 reps  ASSESSMENT:  CLINICAL IMPRESSION:  Pt arrives today doing OK, having quite a bit more pain in standing today than in resting. 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 Tolerated session well.      OBJECTIVE IMPAIRMENTS: Abnormal gait, decreased activity tolerance, decreased endurance, decreased mobility, difficulty walking, decreased ROM, decreased strength, hypomobility, impaired flexibility, and pain.   ACTIVITY LIMITATIONS: standing, squatting, stairs, transfers, and locomotion level  PARTICIPATION LIMITATIONS: meal prep, cleaning, laundry, shopping, and community activity  PERSONAL FACTORS: Fitness, Time since onset of injury/illness/exacerbation, and 3+ comorbidities: Hx of L femur and tibia fractures with multiple surgeries, autoimmune disorder, osteoporosis though Pt states it has improved and may be osteopenia ; OA in knees, R achilles pain are also affecting patient's functional outcome.   REHAB POTENTIAL: Fair prior responses to PT, chronic condition  CLINICAL DECISION MAKING: Evolving/moderate complexity  EVALUATION COMPLEXITY: Moderate   GOALS:   SHORT TERM GOALS: Target date:  01/22/2024 Pt will be independent and compliant with HEP for improved pain, strength, ROM, and function.  Baseline: Goal status: MET 01/22/24  2.  Pt will demo improved L knee  extension AROM to 0 deg for improved gait and stiffness.  Baseline:  Goal status: NOT MET   3.  Pt will be able to stand for 7-8 mins with < 6/10 pain. Baseline:  Goal status: NOT MET  4.  Pt will report at least a 25% improvement in pain and sx's overall.   Baseline:  Goal status: GOAL MET  02/15/24   LONG TERM GOALS: Target date: 03/14/2024   Pt will report she is able to stand for longer duration to perform household chores with decreased seated rest breaks.  Baseline:  Goal status: ONGOING  2.  Pt will be able to stand for the majority of time while she is cooking without significant increased pain. Baseline:  Goal status: ONGOING  3.  Pt will be able to perform 5x STS test in < than 14 sec for improved  functional LE strength and performance of transfers.  Baseline:  Goal status: NOT MET    4.  Pt will report at least a 70% improvement with her daily ambulation.   Baseline:  Goal status: ONGOING  5.  Pt will report she is able to stand 15 mins without significant increased pain.  Baseline:  Goal status: ONGOING  6.  Pt will demo improved LE strength to 5/5 in bilat  knee extension and hip flexion and 4/5 in L hip abduction for improved performance of and tolerance with functional mobility.  Baseline:  Goal status: ONGOING   PLAN:  PT FREQUENCY: 2x/week  PT DURATION: 4 weeks  PLANNED INTERVENTIONS: [id number]- PT Re-evaluation, 97750- Physical Performance Testing, 97110-Therapeutic exercises, 97530- Therapeutic activity, [id number]- Neuromuscular re-education, 97535- Self Care, [id number]- Manual therapy, [id number]- Gait training, [phone]- Aquatic Therapy, [phone]- Electrical stimulation (unattended), 20560 (1-2 muscles), 20561 (3+ muscles)- Dry Needling, Patient/Family education, Balance training, Stair training, Taping, Cryotherapy, and Moist heat  PLAN FOR NEXT SESSION:  Cont with STM as tolerated, gait, ROM, strengthening, and mobility training.  Pt has a nickel allergy.     Josette Rough, PT, DPT 02/19/24 3:14 PM

## 2024-02-21 ENCOUNTER — Telehealth: Payer: Self-pay | Admitting: *Deleted

## 2024-02-21 NOTE — Telephone Encounter (Signed)
 To set up colonoscopy. LM with call back #

## 2024-02-22 ENCOUNTER — Ambulatory Visit (HOSPITAL_BASED_OUTPATIENT_CLINIC_OR_DEPARTMENT_OTHER): Admitting: Physical Therapy

## 2024-02-22 ENCOUNTER — Encounter: Admitting: Dietician

## 2024-02-22 ENCOUNTER — Encounter (HOSPITAL_BASED_OUTPATIENT_CLINIC_OR_DEPARTMENT_OTHER): Payer: Self-pay | Admitting: Physical Therapy

## 2024-02-22 DIAGNOSIS — M6281 Muscle weakness (generalized): Secondary | ICD-10-CM

## 2024-02-22 DIAGNOSIS — R262 Difficulty in walking, not elsewhere classified: Secondary | ICD-10-CM

## 2024-02-22 DIAGNOSIS — M25562 Pain in left knee: Secondary | ICD-10-CM | POA: Diagnosis not present

## 2024-02-22 DIAGNOSIS — M25662 Stiffness of left knee, not elsewhere classified: Secondary | ICD-10-CM

## 2024-02-22 DIAGNOSIS — G8929 Other chronic pain: Secondary | ICD-10-CM

## 2024-02-22 DIAGNOSIS — M25661 Stiffness of right knee, not elsewhere classified: Secondary | ICD-10-CM

## 2024-02-22 NOTE — Therapy (Signed)
 OUTPATIENT PHYSICAL THERAPY LOWER EXTREMITY TREATMENT        Patient Name: Barbara [name] MRN: [id number] DOB:February 16, 1951, 73 y.o., female Today's Date: 02/23/2024  END OF SESSION:  PT End of Session - 02/22/24 0948     Visit Number 10    Number of Visits 16    Date for Recertification  03/14/24    Authorization Type [employer] MCR    Authorization Time Period 01/01/24 - 03/01/2024    Authorization - Number of Visits 20    PT Start Time 0943    PT Stop Time 1025    PT Time Calculation (min) 42 min    Activity Tolerance Patient tolerated treatment well    Behavior During Therapy [facility] for tasks assessed/performed                 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, nickel  03/31/2017   Anxiety    Arthritis    knees   Cancer (HCC)    basal cell- nose   GERD (gastroesophageal reflux disease)    Hyperlipidemia    Hypertension    Meningioma (HCC)    Neuromuscular disorder (HCC)    benign tremor- takes Propanolol   PONV (postoperative nausea and vomiting)    BP DROPS ALSO   Trigeminal neuralgia of right side of face    Past Surgical History:  Procedure Laterality Date   10 HOUR PH STUDY N/A 04/17/2016   Procedure: 24 HOUR PH STUDY;  Surgeon: Elspeth Deward Naval, MD;  Location: [facility];  Service: Gastroenterology;  Laterality: N/A;   CHOLECYSTECTOMY N/A 08/18/2023   Procedure: LAPAROSCOPIC CHOLECYSTECTOMY;  Surgeon: Tanda Locus, MD;  Location: [facility];  Service: General;  Laterality: N/A;   ESOPHAGEAL MANOMETRY N/A 04/17/2016   Procedure: ESOPHAGEAL MANOMETRY (EM);  Surgeon: Elspeth Deward Naval, MD;  Location: [facility];  Service: Gastroenterology;  Laterality: N/A;   ESOPHAGOGASTRODUODENOSCOPY N/A 08/16/2023   Procedure: EGD (ESOPHAGOGASTRODUODENOSCOPY);  Surgeon: Abran Norleen SAILOR, MD;  Location: [facility];  Service: Gastroenterology;  Laterality: N/A;   EXTERNAL FIXATION LEG Left 03/28/2017   Tibia   EXTERNAL FIXATION LEG Left  03/28/2017   Procedure: EXTERNAL FIXATION LEG;  Surgeon: Beverley Evalene BIRCH, MD;  Location: [facility];  Service: Orthopedics;  Laterality: Left;   EXTERNAL FIXATION REMOVAL Left 05/08/2017   Procedure: REMOVAL EXTERNAL FIXATION LEFT  LEG WITH HARDWARE REMOVAL;  Surgeon: Beverley Evalene BIRCH, MD;  Location: [facility];  Service: Orthopedics;  Laterality: Left;   fractured femur  05/2012   left - rod -screws placed   HARDWARE REMOVAL Left 04/16/2013   Procedure: REMOVAL OF HARDWARE OF LEFT KNEE (DISTAL INTERLOC SCREW);  Surgeon: Dempsey LULLA Moan, MD;  Location: [facility];  Service: Orthopedics;  Laterality: Left;   INDOCYANINE GREEN  FLUORESCENCE IMAGING (ICG) N/A 08/18/2023   Procedure: INDOCYANINE GREEN  FLUORESCENCE IMAGING (ICG);  Surgeon: Tanda Locus, MD;  Location: [facility];  Service: General;  Laterality: N/A;   KNEE SURGERY     x 3 left / x1 rt knee   LEFT HEART CATH AND CORONARY ANGIOGRAPHY N/A 09/18/2016   Procedure: Left Heart Cath and Coronary Angiography;  Surgeon: Wonda Sharper, MD;  Location: [facility] IN[facility]ASIVE CV [facility];  Service: Cardiovascular;  Laterality: N/A;   NASAL SINUS SURGERY     2000   OTHER SURGICAL HISTORY  2014   titanium rod in left femur used for fracture repair   SHOULDER SURGERY     bilateral shoulders  WISDOM TOOTH EXTRACTION     Patient Active Problem List   Diagnosis Date Noted   Abdominal pain 08/16/2023   Calculus of gallbladder without cholecystitis without obstruction 08/16/2023   Recurrent biliary colic 08/16/2023   Hiatal hernia 08/16/2023   Degenerative arthritis of right knee 09/28/2022   Leg pain 08/08/2022   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, nickel  03/31/2017   Closed left tibial fracture 03/28/2017   Closed fracture of left tibial plateau 03/28/2017   Nonintractable headache 01/25/2017   Visual field defect    Chest pain 09/16/2016   Essential tremor 09/16/2016   Right facial numbness 09/16/2016   Bilateral scleritis 08/29/2016   Obesity 06/01/2016    Dyspnea on exertion 05/13/2016   Hoarseness 03/17/2016   Gastroesophageal reflux disease without esophagitis 03/17/2016   Globus sensation 03/17/2016   Gastritis 02/25/2016   Atypical chest pain 02/24/2016   Painful orthopaedic hardware 04/15/2013   Vitamin D deficiency 12/09/2009   HYPERCHOLESTEROLEMIA 12/09/2009   Essential hypertension 12/09/2009   Allergic rhinitis 12/09/2009   NEPHROLITHIASIS 12/09/2009   CERVICAL POLYP 12/09/2009   Osteoarthritis 12/09/2009   Cough variant asthma vs UACS  12/09/2009     REFERRING PROVIDER: Margeret Kotyk, MD   REFERRING DIAG: M79.606 (ICD-10-CM) - Generalized pain of knee region   THERAPY DIAG:  Chronic pain of left knee  Muscle weakness (generalized)  Chronic pain of right knee  Stiffness of right knee, not elsewhere classified  Stiffness of left knee, not elsewhere classified  Difficulty in walking, not elsewhere classified  Rationale for Evaluation and Treatment: Rehabilitation  ONSET DATE: chronic sx's ; PT order 12/25/2023  SUBJECTIVE:   SUBJECTIVE STATEMENT:  Pt states the deep tissue work still helps.  She feels better for days after the manual therapy.  Pt wants to return to using her airdyne.     PERTINENT HISTORY: PMHx:  Hx of L femur and tibia fractures with multiple surgeries including hardware removal (last surgery in 2019); R knee arthroscopic surgery in the 90's ; Allergy to nickel and autoimmune disorder, osteoporosis though Pt states it has improved and may be osteopenia ; OA in knees, cervical pain, bilat shoulder surgeries,  R achilles pain due to a reaction from statins, meningioma, and hx trigeminal neuralgia   Gallbladder removal 06/25  PAIN:  B legs/knees 2/10 at rest, 3-4 in standing/with walking Pain is mostly in knees, some skin soreness from massage Massage helps the most Overdoing and being up on feet makes it worse    PRECAUTIONS: Other: Hx of L LE fractures/surgeries, allergy to nickel,  osteoporosis   WEIGHT BEARING RESTRICTIONS: No  FALLS:  Has patient fallen in last 6 months? No  LIVING ENVIRONMENT: Lives with: lives with their spouse Lives in: House/apartment Stairs: 2 STE in garage, 4 STE B rails; no steps inside home     PLOF: Independent  PATIENT GOALS: to be able to stand without pain.  Improved walking after sitting   OBJECTIVE:  Note: Objective measures were completed at Evaluation unless otherwise noted.  DIAGNOSTIC FINDINGS:   PATIENT SURVEYS:  LEFS:  22/80 (Eval)  23/80 (02/15/24)  COGNITION: Overall cognitive status: Within functional limits for tasks assessed       LOWER EXTREMITY ROM:  AROM/PROM Right eval Left eval Right AROM 01/22/24 Left AROM 01/22/24 Right 12/5 Left 12/5  Hip flexion        Hip extension        Hip abduction        Hip adduction  Hip internal rotation        Hip external rotation        Knee flexion 115 121 110* 116* 121 with 4/10 pain 117 deg with 3/10 pain  Knee extension 20/17 5/3 18* 5* 18 deg with 4/10 pain 6 deg with 3/10 pain  Ankle dorsiflexion        Ankle plantarflexion        Ankle inversion        Ankle eversion         (Blank rows = not tested)  LOWER EXTREMITY MMT:  MMT Right eval Left eval Right 12/5 Left 12/5  Hip flexion 4/5 4+/5 4+/5 5/5  Hip extension      Hip abduction 4-5/ with pain Unable to tolerate resistance, painful 4+/5 with pain 3+/5  Hip adduction      Hip internal rotation      Hip external rotation 5/5 4-/5  4+/5  Knee flexion 5/5 seated 5/5 seated    Knee extension 4/5 4-/5 4/5 4-/5  Ankle dorsiflexion      Ankle plantarflexion      Ankle inversion      Ankle eversion       (Blank rows = not tested)    FUNCTIONAL TESTS:  5x STS test:  19.30 sec without Ue's (Initial) / 19 sec without Ue's  (02/15/24) increased R > L knee pain   GAIT: Comments:  She has a very slow gait speed.  Increased knee IR on L and decreased flexion and extension t/o gaity  cycle.                                                                                                                                 TREATMENT:    02/22/24 Nustep L3x6 minutes seat 9 all four extremities  PT educated pt with appropriate set up and positioning on upright bike.  Pt used upright bike lvl 0 x 3 mins Sidestepping x 3 laps at rail with UE support on rail Staggered stance standing on airex 2x30 sec each   LAQs  1.5# 2x10 B  STM to bilat quads and ITB in supine with bilat LE's elevated    02/19/24  Nustep L3x8 minutes seat 9 all four extremities  Lateral wt shifts on blue foam x2 min, minimal use of UEs  Wide tandem blue foam 4x30 seconds alternating   LAQs 1# x10 B, 1.5# 2x10 B Seated clams green TB 2x15   Tennis ball massage R lateral thigh and HS proximal attachment points     02/15/2024 Reviewed current function, pain levels, response to prior treatment, and HEP compliance.  Nustep lvl 3 bilat UE/LE's x 6 mins Assessed knee ROM, LE strength, and 5x STS test.  See above. Pt completed LEFS.  See above  STM to L quad and ITB in supine with bilat LE's elevated on gray bolster     01/28/24 Reviewed pt presentation, HEP  compliance, pain levels and response to prior treatment.  Nustep lvl 3 x 5 mins bilat UE/Les STM to R quad, HS, and ITB and L ITB in supine with bilat LE's elevated on gray bolster LAQ R:  x10, 1# x10, L:  1.5# 2x10 Seated hip abd with with GTB 2x15 Standing wt shifts on airex s/s  Standing on airex with NBOS x 1 min Sidestepping x 2 laps with UE support     PATIENT EDUCATION:  Education details: dx, exercise form, relevant anatomy, POC, and rationale of interventions.  PT answered pt's questions.   Person educated: Patient Education method: Explanation, demonstration, verbal and tactile cues Education comprehension: verbalized understanding and needs further education, verbal and tactile cues required, returned demonstration  HOME  EXERCISE PROGRAM: Access Code: [email or web address] URL: [email or web address] Date: 01/08/2024 Prepared by: Mose Minerva  Exercises - Seated Long Arc Quad  - 1 x daily - 7 x weekly - 2 sets - 10 reps - Supine Short Arc Quad  - 1 x daily - 7 x weekly - 2 sets - 10 reps - Seated knee extension Stretch  - 2 x daily - 7 x weekly - 1 reps - 2 minutes hold - Supine March  - 1 x daily - 7 x weekly - 2 sets - 15 reps - Seated Hip Abduction with Resistance  - 1 x daily - 4 x weekly - 2 sets - 10 reps  ASSESSMENT:  CLINICAL IMPRESSION:  Pt reports continued improvement of sx's with STM to LE's.  She has soft tissue tightness and tenderness greater in lateral quad and ITB bilat.  PT educated pt with appropriate set up and positioning on upright bike and had pt try the upright since she wants to return to using her airdyne.  Pt tolerated 3 mins on the upright bike well.  Pt gives good effort with all exercises and tolerated exercises well.  She responded well to treatment and reports feeling better after the STM.  Pt should benefit from cont skilled PT to address impairments and goals to improve functional mobility.    OBJECTIVE IMPAIRMENTS: Abnormal gait, decreased activity tolerance, decreased endurance, decreased mobility, difficulty walking, decreased ROM, decreased strength, hypomobility, impaired flexibility, and pain.   ACTIVITY LIMITATIONS: standing, squatting, stairs, transfers, and locomotion level  PARTICIPATION LIMITATIONS: meal prep, cleaning, laundry, shopping, and community activity  PERSONAL FACTORS: Fitness, Time since onset of injury/illness/exacerbation, and 3+ comorbidities: Hx of L femur and tibia fractures with multiple surgeries, autoimmune disorder, osteoporosis though Pt states it has improved and may be osteopenia ; OA in knees, R achilles pain are also affecting patient's functional outcome.   REHAB POTENTIAL: Fair prior responses to PT, chronic condition  CLINICAL  DECISION MAKING: Evolving/moderate complexity  EVALUATION COMPLEXITY: Moderate   GOALS:   SHORT TERM GOALS: Target date:  01/22/2024 Pt will be independent and compliant with HEP for improved pain, strength, ROM, and function.  Baseline: Goal status: MET 01/22/24  2.  Pt will demo improved L knee extension AROM to 0 deg for improved gait and stiffness.  Baseline:  Goal status: NOT MET   3.  Pt will be able to stand for 7-8 mins with < 6/10 pain. Baseline:  Goal status: NOT MET  4.  Pt will report at least a 25% improvement in pain and sx's overall.   Baseline:  Goal status: GOAL MET  02/15/24   LONG TERM GOALS: Target date: 03/14/2024   Pt will report she is able to stand for  longer duration to perform household chores with decreased seated rest breaks.  Baseline:  Goal status: ONGOING  2.  Pt will be able to stand for the majority of time while she is cooking without significant increased pain. Baseline:  Goal status: ONGOING  3.  Pt will be able to perform 5x STS test in < than 14 sec for improved functional LE strength and performance of transfers.  Baseline:  Goal status: NOT MET    4.  Pt will report at least a 70% improvement with her daily ambulation.   Baseline:  Goal status: ONGOING  5.  Pt will report she is able to stand 15 mins without significant increased pain.  Baseline:  Goal status: ONGOING  6.  Pt will demo improved LE strength to 5/5 in bilat  knee extension and hip flexion and 4/5 in L hip abduction for improved performance of and tolerance with functional mobility.  Baseline:  Goal status: ONGOING   PLAN:  PT FREQUENCY: 2x/week  PT DURATION: 4 weeks  PLANNED INTERVENTIONS: 97164- PT Re-evaluation, 97750- Physical Performance Testing, 97110-Therapeutic exercises, 97530- Therapeutic activity, [id number]- Neuromuscular re-education, 97535- Self Care, [id number]- Manual therapy, [id number]- Gait training, [phone]- Aquatic Therapy, [phone]- Electrical stimulation  (unattended), 20560 (1-2 muscles), 20561 (3+ muscles)- Dry Needling, Patient/Family education, Balance training, Stair training, Taping, Cryotherapy, and Moist heat  PLAN FOR NEXT SESSION:  Cont with STM as tolerated, gait, ROM, strengthening, and mobility training.  Pt has a nickel allergy.     Leigh Minerva III PT, DPT 02/23/2024 7:49 AM

## 2024-02-26 ENCOUNTER — Encounter (HOSPITAL_BASED_OUTPATIENT_CLINIC_OR_DEPARTMENT_OTHER): Payer: Self-pay | Admitting: Physical Therapy

## 2024-02-26 ENCOUNTER — Ambulatory Visit (HOSPITAL_BASED_OUTPATIENT_CLINIC_OR_DEPARTMENT_OTHER): Admitting: Physical Therapy

## 2024-02-26 DIAGNOSIS — M25662 Stiffness of left knee, not elsewhere classified: Secondary | ICD-10-CM

## 2024-02-26 DIAGNOSIS — G8929 Other chronic pain: Secondary | ICD-10-CM

## 2024-02-26 DIAGNOSIS — R262 Difficulty in walking, not elsewhere classified: Secondary | ICD-10-CM

## 2024-02-26 DIAGNOSIS — M6281 Muscle weakness (generalized): Secondary | ICD-10-CM

## 2024-02-26 DIAGNOSIS — M79604 Pain in right leg: Secondary | ICD-10-CM

## 2024-02-26 DIAGNOSIS — M25562 Pain in left knee: Secondary | ICD-10-CM | POA: Diagnosis not present

## 2024-02-26 DIAGNOSIS — M25661 Stiffness of right knee, not elsewhere classified: Secondary | ICD-10-CM

## 2024-02-26 NOTE — Therapy (Signed)
 OUTPATIENT PHYSICAL THERAPY LOWER EXTREMITY TREATMENT        Patient Name: Barbara [name] MRN: [id number] DOB:[birth year], 73 y.o., female Today's Date: 02/26/2024  END OF SESSION:  PT End of Session - 02/26/24 1148     Visit Number 11    Number of Visits 16    Date for Recertification  03/14/24    Authorization Type [employer] MCR    Authorization Time Period 01/01/24 - 03/01/2024    Authorization - Visit Number 7    Authorization - Number of Visits 20    PT Start Time 1148    PT Stop Time 1228    PT Time Calculation (min) 40 min    Activity Tolerance Patient tolerated treatment well    Behavior During Therapy [facility] for tasks assessed/performed                 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, nickel  03/31/2017   Anxiety    Arthritis    knees   Cancer (HCC)    basal cell- nose   GERD (gastroesophageal reflux disease)    Hyperlipidemia    Hypertension    Meningioma (HCC)    Neuromuscular disorder (HCC)    benign tremor- takes Propanolol   PONV (postoperative nausea and vomiting)    BP DROPS ALSO   Trigeminal neuralgia of right side of face    Past Surgical History:  Procedure Laterality Date   84 HOUR PH STUDY N/A 04/17/2016   Procedure: 24 HOUR PH STUDY;  Surgeon: Elspeth Deward Naval, MD;  Location: [facility];  Service: Gastroenterology;  Laterality: N/A;   CHOLECYSTECTOMY N/A 08/18/2023   Procedure: LAPAROSCOPIC CHOLECYSTECTOMY;  Surgeon: Tanda Locus, MD;  Location: [facility];  Service: General;  Laterality: N/A;   ESOPHAGEAL MANOMETRY N/A 04/17/2016   Procedure: ESOPHAGEAL MANOMETRY (EM);  Surgeon: Elspeth Deward Naval, MD;  Location: [facility];  Service: Gastroenterology;  Laterality: N/A;   ESOPHAGOGASTRODUODENOSCOPY N/A 08/16/2023   Procedure: EGD (ESOPHAGOGASTRODUODENOSCOPY);  Surgeon: Abran Norleen SAILOR, MD;  Location: [facility];  Service: Gastroenterology;  Laterality: N/A;   EXTERNAL FIXATION LEG Left 03/28/2017    Tibia   EXTERNAL FIXATION LEG Left 03/28/2017   Procedure: EXTERNAL FIXATION LEG;  Surgeon: Beverley Evalene BIRCH, MD;  Location: [facility];  Service: Orthopedics;  Laterality: Left;   EXTERNAL FIXATION REMOVAL Left 05/08/2017   Procedure: REMOVAL EXTERNAL FIXATION LEFT  LEG WITH HARDWARE REMOVAL;  Surgeon: Beverley Evalene BIRCH, MD;  Location: [facility];  Service: Orthopedics;  Laterality: Left;   fractured femur  05/2012   left - rod -screws placed   HARDWARE REMOVAL Left 04/16/2013   Procedure: REMOVAL OF HARDWARE OF LEFT KNEE (DISTAL INTERLOC SCREW);  Surgeon: Dempsey LULLA Moan, MD;  Location: [facility];  Service: Orthopedics;  Laterality: Left;   INDOCYANINE GREEN  FLUORESCENCE IMAGING (ICG) N/A 08/18/2023   Procedure: INDOCYANINE GREEN  FLUORESCENCE IMAGING (ICG);  Surgeon: Tanda Locus, MD;  Location: [facility];  Service: General;  Laterality: N/A;   KNEE SURGERY     x 3 left / x1 rt knee   LEFT HEART CATH AND CORONARY ANGIOGRAPHY N/A 09/18/2016   Procedure: Left Heart Cath and Coronary Angiography;  Surgeon: Wonda Sharper, MD;  Location: [facility] INVASIVE CV LAB;  Service: Cardiovascular;  Laterality: N/A;   NASAL SINUS SURGERY     2000   OTHER SURGICAL HISTORY  2014   titanium rod in left femur used for fracture repair   SHOULDER SURGERY  bilateral shoulders    WISDOM TOOTH EXTRACTION     Patient Active Problem List   Diagnosis Date Noted   Abdominal pain 08/16/2023   Calculus of gallbladder without cholecystitis without obstruction 08/16/2023   Recurrent biliary colic 08/16/2023   Hiatal hernia 08/16/2023   Degenerative arthritis of right knee 09/28/2022   Leg pain 08/08/2022   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, nickel  03/31/2017   Closed left tibial fracture 03/28/2017   Closed fracture of left tibial plateau 03/28/2017   Nonintractable headache 01/25/2017   Visual field defect    Chest pain 09/16/2016   Essential tremor 09/16/2016   Right facial numbness 09/16/2016   Bilateral scleritis  08/29/2016   Obesity 06/01/2016   Dyspnea on exertion 05/13/2016   Hoarseness 03/17/2016   Gastroesophageal reflux disease without esophagitis 03/17/2016   Globus sensation 03/17/2016   Gastritis 02/25/2016   Atypical chest pain 02/24/2016   Painful orthopaedic hardware 04/15/2013   Vitamin D deficiency 12/09/2009   HYPERCHOLESTEROLEMIA 12/09/2009   Essential hypertension 12/09/2009   Allergic rhinitis 12/09/2009   NEPHROLITHIASIS 12/09/2009   CERVICAL POLYP 12/09/2009   Osteoarthritis 12/09/2009   Cough variant asthma vs UACS  12/09/2009     REFERRING PROVIDER: Margeret Kotyk, MD   REFERRING DIAG: M79.606 (ICD-10-CM) - Generalized pain of knee region   THERAPY DIAG:  Chronic pain of left knee  Muscle weakness (generalized)  Chronic pain of right knee  Stiffness of right knee, not elsewhere classified  Stiffness of left knee, not elsewhere classified  Difficulty in walking, not elsewhere classified  Pain in right leg  Rationale for Evaluation and Treatment: Rehabilitation  ONSET DATE: chronic sx's ; PT order 12/25/2023  SUBJECTIVE:   SUBJECTIVE STATEMENT:  Pt states continued knee pain. Manual is helpful.    PERTINENT HISTORY: PMHx:  Hx of L femur and tibia fractures with multiple surgeries including hardware removal (last surgery in 2019); R knee arthroscopic surgery in the 90's ; Allergy to nickel and autoimmune disorder, osteoporosis though Pt states it has improved and may be osteopenia ; OA in knees, cervical pain, bilat shoulder surgeries,  R achilles pain due to a reaction from statins, meningioma, and hx trigeminal neuralgia   Gallbladder removal 06/25  PAIN:  B legs/knees 2/10 at rest, 3-4 in standing/with walking Pain is mostly in knees, some skin soreness from massage Massage helps the most Overdoing and being up on feet makes it worse    PRECAUTIONS: Other: Hx of L LE fractures/surgeries, allergy to nickel, osteoporosis   WEIGHT BEARING  RESTRICTIONS: No  FALLS:  Has patient fallen in last 6 months? No  LIVING ENVIRONMENT: Lives with: lives with their spouse Lives in: House/apartment Stairs: 2 STE in garage, 4 STE B rails; no steps inside home     PLOF: Independent  PATIENT GOALS: to be able to stand without pain.  Improved walking after sitting   OBJECTIVE:  Note: Objective measures were completed at Evaluation unless otherwise noted.  DIAGNOSTIC FINDINGS:   PATIENT SURVEYS:  LEFS:  22/80 (Eval)  23/80 (02/15/24)  COGNITION: Overall cognitive status: Within functional limits for tasks assessed       LOWER EXTREMITY ROM:  AROM/PROM Right eval Left eval Right AROM 01/22/24 Left AROM 01/22/24 Right 12/5 Left 12/5  Hip flexion        Hip extension        Hip abduction        Hip adduction        Hip internal rotation  Hip external rotation        Knee flexion 115 121 110* 116* 121 with 4/10 pain 117 deg with 3/10 pain  Knee extension 20/17 5/3 18* 5* 18 deg with 4/10 pain 6 deg with 3/10 pain  Ankle dorsiflexion        Ankle plantarflexion        Ankle inversion        Ankle eversion         (Blank rows = not tested)  LOWER EXTREMITY MMT:  MMT Right eval Left eval Right 12/5 Left 12/5  Hip flexion 4/5 4+/5 4+/5 5/5  Hip extension      Hip abduction 4-5/ with pain Unable to tolerate resistance, painful 4+/5 with pain 3+/5  Hip adduction      Hip internal rotation      Hip external rotation 5/5 4-/5  4+/5  Knee flexion 5/5 seated 5/5 seated    Knee extension 4/5 4-/5 4/5 4-/5  Ankle dorsiflexion      Ankle plantarflexion      Ankle inversion      Ankle eversion       (Blank rows = not tested)    FUNCTIONAL TESTS:  5x STS test:  19.30 sec without Ue's (Initial) / 19 sec without Ue's  (02/15/24) increased R > L knee pain   GAIT: Comments:  She has a very slow gait speed.  Increased knee IR on L and decreased flexion and extension t/o gaity cycle.                                                                                                                                  TREATMENT:   02/26/24 Nustep L3x6 minutes seat 9 all four extremities  STM to R quad, HS, and ITB and L ITB in supine with bilat LE's elevated on gray bolster LAQ R:  1.5# 2 x10, L:  1.5# 2x10 Standing hamstring curls 1.5# 1 x 10    02/22/24 Nustep L3x6 minutes seat 9 all four extremities  PT educated pt with appropriate set up and positioning on upright bike.  Pt used upright bike lvl 0 x 3 mins Sidestepping x 3 laps at rail with UE support on rail Staggered stance standing on airex 2x30 sec each   LAQs  1.5# 2x10 B  STM to bilat quads and ITB in supine with bilat LE's elevated    02/19/24  Nustep L3x8 minutes seat 9 all four extremities  Lateral wt shifts on blue foam x2 min, minimal use of UEs  Wide tandem blue foam 4x30 seconds alternating   LAQs 1# x10 B, 1.5# 2x10 B Seated clams green TB 2x15   Tennis ball massage R lateral thigh and HS proximal attachment points     02/15/2024 Reviewed current function, pain levels, response to prior treatment, and HEP compliance.  Nustep lvl 3 bilat UE/LE's x 6 mins Assessed knee ROM, LE strength,  and 5x STS test.  See above. Pt completed LEFS.  See above  STM to L quad and ITB in supine with bilat LE's elevated on gray bolster     01/28/24 Reviewed pt presentation, HEP compliance, pain levels and response to prior treatment.  Nustep lvl 3 x 5 mins bilat UE/Les STM to R quad, HS, and ITB and L ITB in supine with bilat LE's elevated on gray bolster LAQ R:  x10, 1# x10, L:  1.5# 2x10 Seated hip abd with with GTB 2x15 Standing wt shifts on airex s/s  Standing on airex with NBOS x 1 min Sidestepping x 2 laps with UE support     PATIENT EDUCATION:  Education details: dx, exercise form, relevant anatomy, POC, and rationale of interventions.  PT answered pt's questions.   Person educated: Patient Education method:  Explanation, demonstration, verbal and tactile cues Education comprehension: verbalized understanding and needs further education, verbal and tactile cues required, returned demonstration  HOME EXERCISE PROGRAM: Access Code: [email or web address] URL: [email or web address] Date: 01/08/2024 Prepared by: Mose Minerva  Exercises - Seated Long Arc Quad  - 1 x daily - 7 x weekly - 2 sets - 10 reps - Supine Short Arc Quad  - 1 x daily - 7 x weekly - 2 sets - 10 reps - Seated knee extension Stretch  - 2 x daily - 7 x weekly - 1 reps - 2 minutes hold - Supine March  - 1 x daily - 7 x weekly - 2 sets - 15 reps - Seated Hip Abduction with Resistance  - 1 x daily - 4 x weekly - 2 sets - 10 reps  ASSESSMENT:  CLINICAL IMPRESSION:  Began session on nustep for dynamic warm up. Continued with manual to hyperactive and tender musculature bilaterally. Worked into additional quad strength and lengthening exercises. Patient will continue to benefit from physical therapy in order to improve function and reduce impairment.    OBJECTIVE IMPAIRMENTS: Abnormal gait, decreased activity tolerance, decreased endurance, decreased mobility, difficulty walking, decreased ROM, decreased strength, hypomobility, impaired flexibility, and pain.   ACTIVITY LIMITATIONS: standing, squatting, stairs, transfers, and locomotion level  PARTICIPATION LIMITATIONS: meal prep, cleaning, laundry, shopping, and community activity  PERSONAL FACTORS: Fitness, Time since onset of injury/illness/exacerbation, and 3+ comorbidities: Hx of L femur and tibia fractures with multiple surgeries, autoimmune disorder, osteoporosis though Pt states it has improved and may be osteopenia ; OA in knees, R achilles pain are also affecting patient's functional outcome.   REHAB POTENTIAL: Fair prior responses to PT, chronic condition  CLINICAL DECISION MAKING: Evolving/moderate complexity  EVALUATION COMPLEXITY: Moderate   GOALS:   SHORT  TERM GOALS: Target date:  01/22/2024 Pt will be independent and compliant with HEP for improved pain, strength, ROM, and function.  Baseline: Goal status: MET 01/22/24  2.  Pt will demo improved L knee extension AROM to 0 deg for improved gait and stiffness.  Baseline:  Goal status: NOT MET   3.  Pt will be able to stand for 7-8 mins with < 6/10 pain. Baseline:  Goal status: NOT MET  4.  Pt will report at least a 25% improvement in pain and sx's overall.   Baseline:  Goal status: GOAL MET  02/15/24   LONG TERM GOALS: Target date: 03/14/2024   Pt will report she is able to stand for longer duration to perform household chores with decreased seated rest breaks.  Baseline:  Goal status: ONGOING  2.  Pt will be able to stand  for the majority of time while she is cooking without significant increased pain. Baseline:  Goal status: ONGOING  3.  Pt will be able to perform 5x STS test in < than 14 sec for improved functional LE strength and performance of transfers.  Baseline:  Goal status: NOT MET    4.  Pt will report at least a 70% improvement with her daily ambulation.   Baseline:  Goal status: ONGOING  5.  Pt will report she is able to stand 15 mins without significant increased pain.  Baseline:  Goal status: ONGOING  6.  Pt will demo improved LE strength to 5/5 in bilat  knee extension and hip flexion and 4/5 in L hip abduction for improved performance of and tolerance with functional mobility.  Baseline:  Goal status: ONGOING   PLAN:  PT FREQUENCY: 2x/week  PT DURATION: 4 weeks  PLANNED INTERVENTIONS: [id number]- PT Re-evaluation, 97750- Physical Performance Testing, 97110-Therapeutic exercises, 97530- Therapeutic activity, [id number]- Neuromuscular re-education, 97535- Self Care, [id number]- Manual therapy, [id number]- Gait training, [phone]- Aquatic Therapy, [phone]- Electrical stimulation (unattended), 20560 (1-2 muscles), 20561 (3+ muscles)- Dry Needling, Patient/Family education, Balance  training, Stair training, Taping, Cryotherapy, and Moist heat  PLAN FOR NEXT SESSION:  Cont with STM as tolerated, gait, ROM, strengthening, and mobility training.  Pt has a nickel allergy.      Prentice GORMAN Stains, PT, DPT 02/26/2024, 12:34 PM

## 2024-02-29 ENCOUNTER — Encounter: Admitting: Dietician

## 2024-02-29 ENCOUNTER — Ambulatory Visit (HOSPITAL_BASED_OUTPATIENT_CLINIC_OR_DEPARTMENT_OTHER): Admitting: Physical Therapy

## 2024-02-29 ENCOUNTER — Encounter (HOSPITAL_BASED_OUTPATIENT_CLINIC_OR_DEPARTMENT_OTHER): Payer: Self-pay | Admitting: Physical Therapy

## 2024-02-29 DIAGNOSIS — M25562 Pain in left knee: Secondary | ICD-10-CM | POA: Diagnosis not present

## 2024-02-29 DIAGNOSIS — G8929 Other chronic pain: Secondary | ICD-10-CM

## 2024-02-29 DIAGNOSIS — M25661 Stiffness of right knee, not elsewhere classified: Secondary | ICD-10-CM

## 2024-02-29 DIAGNOSIS — R262 Difficulty in walking, not elsewhere classified: Secondary | ICD-10-CM

## 2024-02-29 DIAGNOSIS — M25662 Stiffness of left knee, not elsewhere classified: Secondary | ICD-10-CM

## 2024-02-29 DIAGNOSIS — M6281 Muscle weakness (generalized): Secondary | ICD-10-CM

## 2024-02-29 NOTE — Therapy (Signed)
 " OUTPATIENT PHYSICAL THERAPY LOWER EXTREMITY TREATMENT        Patient Name: Barbara [name] MRN: [id number] DOB:11-13-1950, 73 y.o., female Today's Date: 02/29/2024  END OF SESSION:  PT End of Session - 02/29/24 1205     Visit Number 12    Number of Visits 16    Date for Recertification  03/14/24    Authorization Type [employer] MCR    PT Start Time 1116    PT Stop Time 1205    PT Time Calculation (min) 49 min    Activity Tolerance Patient tolerated treatment well    Behavior During Therapy WFL for tasks assessed/performed                  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, nickel  03/31/2017   Anxiety    Arthritis    knees   Cancer (HCC)    basal cell- nose   GERD (gastroesophageal reflux disease)    Hyperlipidemia    Hypertension    Meningioma (HCC)    Neuromuscular disorder (HCC)    benign tremor- takes Propanolol   PONV (postoperative nausea and vomiting)    BP DROPS ALSO   Trigeminal neuralgia of right side of face    Past Surgical History:  Procedure Laterality Date   50 HOUR PH STUDY N/A 04/17/2016   Procedure: 24 HOUR PH STUDY;  Surgeon: Elspeth Deward Naval, MD;  Location: [facility];  Service: Gastroenterology;  Laterality: N/A;   CHOLECYSTECTOMY N/A 08/18/2023   Procedure: LAPAROSCOPIC CHOLECYSTECTOMY;  Surgeon: Tanda Locus, MD;  Location: [facility];  Service: General;  Laterality: N/A;   ESOPHAGEAL MANOMETRY N/A 04/17/2016   Procedure: ESOPHAGEAL MANOMETRY (EM);  Surgeon: Elspeth Deward Naval, MD;  Location: [facility];  Service: Gastroenterology;  Laterality: N/A;   ESOPHAGOGASTRODUODENOSCOPY N/A 08/16/2023   Procedure: EGD (ESOPHAGOGASTRODUODENOSCOPY);  Surgeon: Abran Norleen SAILOR, MD;  Location: [facility];  Service: Gastroenterology;  Laterality: N/A;   EXTERNAL FIXATION LEG Left 03/28/2017   [place]   EXTERNAL FIXATION LEG Left 03/28/2017   Procedure: EXTERNAL FIXATION LEG;  Surgeon: Beverley Evalene BIRCH, MD;  Location:  [facility];  Service: Orthopedics;  Laterality: Left;   EXTERNAL FIXATION REMOVAL Left 05/08/2017   Procedure: REMOVAL EXTERNAL FIXATION LEFT  LEG WITH HARDWARE REMOVAL;  Surgeon: Beverley Evalene BIRCH, MD;  Location: [facility];  Service: Orthopedics;  Laterality: Left;   fractured femur  05/2012   left - rod -screws placed   HARDWARE REMOVAL Left 04/16/2013   Procedure: REMOVAL OF HARDWARE OF LEFT KNEE (DISTAL INTERLOC SCREW);  Surgeon: Dempsey LULLA Moan, MD;  Location: [facility];  Service: Orthopedics;  Laterality: Left;   INDOCYANINE GREEN  FLUORESCENCE IMAGING (ICG) N/A 08/18/2023   Procedure: INDOCYANINE GREEN  FLUORESCENCE IMAGING (ICG);  Surgeon: Tanda Locus, MD;  Location: [facility];  Service: General;  Laterality: N/A;   KNEE SURGERY     x 3 left / x1 rt knee   LEFT HEART CATH AND CORONARY ANGIOGRAPHY N/A 09/18/2016   Procedure: Left Heart Cath and Coronary Angiography;  Surgeon: Wonda Sharper, MD;  Location: [facility] INVASIVE CV [facility];  Service: Cardiovascular;  Laterality: N/A;   NASAL SINUS SURGERY     2000   OTHER SURGICAL HISTORY  2014   titanium rod in left femur used for fracture repair   SHOULDER SURGERY     bilateral shoulders    WISDOM TOOTH EXTRACTION     Patient Active Problem List   Diagnosis Date  Noted   Abdominal pain 08/16/2023   Calculus of gallbladder without cholecystitis without obstruction 08/16/2023   Recurrent biliary colic 08/16/2023   Hiatal hernia 08/16/2023   Degenerative arthritis of right knee 09/28/2022   Leg pain 08/08/2022   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, nickel  03/31/2017   Closed left tibial fracture 03/28/2017   Closed fracture of left tibial plateau 03/28/2017   Nonintractable headache 01/25/2017   Visual field defect    Chest pain 09/16/2016   Essential tremor 09/16/2016   Right facial numbness 09/16/2016   Bilateral scleritis 08/29/2016   Obesity 06/01/2016   Dyspnea on exertion 05/13/2016   Hoarseness 03/17/2016   Gastroesophageal reflux disease  without esophagitis 03/17/2016   Globus sensation 03/17/2016   Gastritis 02/25/2016   Atypical chest pain 02/24/2016   Painful orthopaedic hardware 04/15/2013   Vitamin D deficiency 12/09/2009   HYPERCHOLESTEROLEMIA 12/09/2009   Essential hypertension 12/09/2009   Allergic rhinitis 12/09/2009   NEPHROLITHIASIS 12/09/2009   CERVICAL POLYP 12/09/2009   Osteoarthritis 12/09/2009   Cough variant asthma vs UACS  12/09/2009     REFERRING PROVIDER: Margeret Kotyk, MD   REFERRING DIAG: M79.606 (ICD-10-CM) - Generalized pain of knee region   THERAPY DIAG:  Chronic pain of left knee  Muscle weakness (generalized)  Chronic pain of right knee  Stiffness of right knee, not elsewhere classified  Stiffness of left knee, not elsewhere classified  Difficulty in walking, not elsewhere classified  Rationale for Evaluation and Treatment: Rehabilitation  ONSET DATE: chronic sx's ; PT order 12/25/2023  SUBJECTIVE:   SUBJECTIVE STATEMENT:  They feel better today than yesterday.  Pt reports having increased pain after prior treatment.  Pt is having pain in bilat knees.  The massage helped when she first left PT though had increased pain that evening.     PERTINENT HISTORY: PMHx:  Hx of L femur and tibia fractures with multiple surgeries including hardware removal (last surgery in 2019); R knee arthroscopic surgery in the 90's ; Allergy to nickel and autoimmune disorder, osteoporosis though Pt states it has improved and may be osteopenia ; OA in knees, cervical pain, bilat shoulder surgeries,  R achilles pain due to a reaction from statins, meningioma, and hx trigeminal neuralgia   Gallbladder removal 06/25  PAIN:   3-4/10 in bilat knees and cervical Pain is mostly in knees, some skin soreness from massage Massage helps the most Overdoing and being up on feet makes it worse    PRECAUTIONS: Other: Hx of L LE fractures/surgeries, allergy to nickel, osteoporosis   WEIGHT BEARING  RESTRICTIONS: No  FALLS:  Has patient fallen in last 6 months? No  LIVING ENVIRONMENT: Lives with: lives with their spouse Lives in: House/apartment Stairs: 2 STE in garage, 4 STE B rails; no steps inside home     PLOF: Independent  PATIENT GOALS: to be able to stand without pain.  Improved walking after sitting   OBJECTIVE:  Note: Objective measures were completed at Evaluation unless otherwise noted.  DIAGNOSTIC FINDINGS:   PATIENT SURVEYS:  LEFS:  22/80 (Eval)  23/80 (02/15/24)  COGNITION: Overall cognitive status: Within functional limits for tasks assessed       LOWER EXTREMITY ROM:  AROM/PROM Right eval Left eval Right AROM 01/22/24 Left AROM 01/22/24 Right 12/5 Left 12/5  Hip flexion        Hip extension        Hip abduction        Hip adduction        Hip  internal rotation        Hip external rotation        Knee flexion 115 121 110* 116* 121 with 4/10 pain 117 deg with 3/10 pain  Knee extension 20/17 5/3 18* 5* 18 deg with 4/10 pain 6 deg with 3/10 pain  Ankle dorsiflexion        Ankle plantarflexion        Ankle inversion        Ankle eversion         (Blank rows = not tested)  LOWER EXTREMITY MMT:  MMT Right eval Left eval Right 12/5 Left 12/5  Hip flexion 4/5 4+/5 4+/5 5/5  Hip extension      Hip abduction 4-5/ with pain Unable to tolerate resistance, painful 4+/5 with pain 3+/5  Hip adduction      Hip internal rotation      Hip external rotation 5/5 4-/5  4+/5  Knee flexion 5/5 seated 5/5 seated    Knee extension 4/5 4-/5 4/5 4-/5  Ankle dorsiflexion      Ankle plantarflexion      Ankle inversion      Ankle eversion       (Blank rows = not tested)    FUNCTIONAL TESTS:  5x STS test:  19.30 sec without Ue's (Initial) / 19 sec without Ue's  (02/15/24) increased R > L knee pain   GAIT: Comments:  She has a very slow gait speed.  Increased knee IR on L and decreased flexion and extension t/o gaity cycle.                                                                                                                                  TREATMENT:   02/29/2024 Nustep L3x6 minutes bilat UE/LE's  Sidestepping in hallway 2x10 with UE support on wall Marches on airex with 1 UE support x10, 3 reps Staggered stance standing on airex 2x30 sec each   Standing HS curls 2x10 Standing wt shifts on airex s/s without UE support 2x10 LAQs  1.5# 2x10 B  STM to bilat quads and ITB in supine with bilat LE's elevated      02/26/24 Nustep L3x6 minutes seat 9 all four extremities  STM to R quad, HS, and ITB and L ITB in supine with bilat LE's elevated on gray bolster LAQ R:  1.5# 2 x10, L:  1.5# 2x10 Standing hamstring curls 1.5# 1 x 10    02/22/24 Nustep L3x6 minutes seat 9 all four extremities  PT educated pt with appropriate set up and positioning on upright bike.  Pt used upright bike lvl 0 x 3 mins Sidestepping x 3 laps at rail with UE support on rail Staggered stance standing on airex 2x30 sec each   LAQs  1.5# 2x10 B  STM to bilat quads and ITB in supine with bilat LE's elevated    02/19/24  Nustep L3x8 minutes seat 9  all four extremities  Lateral wt shifts on blue foam x2 min, minimal use of UEs  Wide tandem blue foam 4x30 seconds alternating   LAQs 1# x10 B, 1.5# 2x10 B Seated clams green TB 2x15   Tennis ball massage R lateral thigh and HS proximal attachment points     02/15/2024 Reviewed current function, pain levels, response to prior treatment, and HEP compliance.  Nustep lvl 3 bilat UE/LE's x 6 mins Assessed knee ROM, LE strength, and 5x STS test.  See above. Pt completed LEFS.  See above  STM to L quad and ITB in supine with bilat LE's elevated on gray bolster     01/28/24 Reviewed pt presentation, HEP compliance, pain levels and response to prior treatment.  Nustep lvl 3 x 5 mins bilat UE/Les STM to R quad, HS, and ITB and L ITB in supine with bilat LE's elevated on gray bolster LAQ R:   x10, 1# x10, L:  1.5# 2x10 Seated hip abd with with GTB 2x15 Standing wt shifts on airex s/s  Standing on airex with NBOS x 1 min Sidestepping x 2 laps with UE support     PATIENT EDUCATION:  Education details: dx, exercise form, relevant anatomy, POC, and rationale of interventions.  PT answered pt's questions.   Person educated: Patient Education method: Explanation, demonstration, verbal and tactile cues Education comprehension: verbalized understanding and needs further education, verbal and tactile cues required, returned demonstration  HOME EXERCISE PROGRAM: Access Code: [email or web address] URL: [email or web address] Date: 01/08/2024 Prepared by: Mose Minerva  Exercises - Seated Long Arc Quad  - 1 x daily - 7 x weekly - 2 sets - 10 reps - Supine Short Arc Quad  - 1 x daily - 7 x weekly - 2 sets - 10 reps - Seated knee extension Stretch  - 2 x daily - 7 x weekly - 1 reps - 2 minutes hold - Supine March  - 1 x daily - 7 x weekly - 2 sets - 15 reps - Seated Hip Abduction with Resistance  - 1 x daily - 4 x weekly - 2 sets - 10 reps  ASSESSMENT:  CLINICAL IMPRESSION:  Pt is frustrated with continued pain and lack of progress.  She continues to have bilat knee pain and is limited with standing duration and ambulation.  Pt states she hurts in standing, but also gets stiff in sitting and has pain with standing up.  PT answered pt's questions and educated pt concerning POC.  Pt had increased pain with standing exercises and PT had pt sit at various times due to pain.  She had pain with airex marches.  Pt was able to perform standing wt shifts on airex and staggered stance on airex without UE support.  Pt states she typically feels good after the massage.  Pt states she feels better and her pain is a little less at the end of treatment after manual therapy.     OBJECTIVE IMPAIRMENTS: Abnormal gait, decreased activity tolerance, decreased endurance, decreased mobility, difficulty  walking, decreased ROM, decreased strength, hypomobility, impaired flexibility, and pain.   ACTIVITY LIMITATIONS: standing, squatting, stairs, transfers, and locomotion level  PARTICIPATION LIMITATIONS: meal prep, cleaning, laundry, shopping, and community activity  PERSONAL FACTORS: Fitness, Time since onset of injury/illness/exacerbation, and 3+ comorbidities: Hx of L femur and tibia fractures with multiple surgeries, autoimmune disorder, osteoporosis though Pt states it has improved and may be osteopenia ; OA in knees, R achilles pain are also affecting patient's functional outcome.  REHAB POTENTIAL: Fair prior responses to PT, chronic condition  CLINICAL DECISION MAKING: Evolving/moderate complexity  EVALUATION COMPLEXITY: Moderate   GOALS:   SHORT TERM GOALS: Target date:  01/22/2024 Pt will be independent and compliant with HEP for improved pain, strength, ROM, and function.  Baseline: Goal status: MET 01/22/24  2.  Pt will demo improved L knee extension AROM to 0 deg for improved gait and stiffness.  Baseline:  Goal status: NOT MET   3.  Pt will be able to stand for 7-8 mins with < 6/10 pain. Baseline:  Goal status: NOT MET  4.  Pt will report at least a 25% improvement in pain and sx's overall.   Baseline:  Goal status: GOAL MET  02/15/24   LONG TERM GOALS: Target date: 03/14/2024   Pt will report she is able to stand for longer duration to perform household chores with decreased seated rest breaks.  Baseline:  Goal status: ONGOING  2.  Pt will be able to stand for the majority of time while she is cooking without significant increased pain. Baseline:  Goal status: ONGOING  3.  Pt will be able to perform 5x STS test in < than 14 sec for improved functional LE strength and performance of transfers.  Baseline:  Goal status: NOT MET    4.  Pt will report at least a 70% improvement with her daily ambulation.   Baseline:  Goal status: ONGOING  5.  Pt will  report she is able to stand 15 mins without significant increased pain.  Baseline:  Goal status: ONGOING  6.  Pt will demo improved LE strength to 5/5 in bilat  knee extension and hip flexion and 4/5 in L hip abduction for improved performance of and tolerance with functional mobility.  Baseline:  Goal status: ONGOING   PLAN:  PT FREQUENCY: 2x/week  PT DURATION: 4 weeks  PLANNED INTERVENTIONS: [id number]- PT Re-evaluation, 97750- Physical Performance Testing, 97110-Therapeutic exercises, 97530- Therapeutic activity, [id number]- Neuromuscular re-education, 97535- Self Care, [id number]- Manual therapy, [id number]- Gait training, [phone]- Aquatic Therapy, [phone]- Electrical stimulation (unattended), 20560 (1-2 muscles), 20561 (3+ muscles)- Dry Needling, Patient/Family education, Balance training, Stair training, Taping, Cryotherapy, and Moist heat  PLAN FOR NEXT SESSION:  Cont with STM as tolerated, gait, ROM, strengthening, and mobility training.  Pt has a nickel allergy.  2 more visits and possible discharge if pt is not seeing progress.     Leigh Minerva III PT, DPT 02/29/2024 12:26 PM    "

## 2024-03-03 ENCOUNTER — Encounter (HOSPITAL_BASED_OUTPATIENT_CLINIC_OR_DEPARTMENT_OTHER): Payer: Self-pay | Admitting: Physical Therapy

## 2024-03-03 ENCOUNTER — Encounter (HOSPITAL_BASED_OUTPATIENT_CLINIC_OR_DEPARTMENT_OTHER): Admitting: Physical Therapy

## 2024-03-03 DIAGNOSIS — M25562 Pain in left knee: Secondary | ICD-10-CM | POA: Diagnosis not present

## 2024-03-03 DIAGNOSIS — M79604 Pain in right leg: Secondary | ICD-10-CM

## 2024-03-03 DIAGNOSIS — M25662 Stiffness of left knee, not elsewhere classified: Secondary | ICD-10-CM

## 2024-03-03 DIAGNOSIS — M25661 Stiffness of right knee, not elsewhere classified: Secondary | ICD-10-CM

## 2024-03-03 DIAGNOSIS — M6281 Muscle weakness (generalized): Secondary | ICD-10-CM

## 2024-03-03 DIAGNOSIS — R262 Difficulty in walking, not elsewhere classified: Secondary | ICD-10-CM

## 2024-03-03 DIAGNOSIS — G8929 Other chronic pain: Secondary | ICD-10-CM

## 2024-03-03 NOTE — Therapy (Signed)
 " OUTPATIENT PHYSICAL THERAPY LOWER EXTREMITY TREATMENT        Patient Name: Barbara [name] MRN: [id number] DOB:[birth year], 73 y.o., female Today's Date: 03/03/2024  END OF SESSION:  PT End of Session - 03/03/24 0842     Visit Number 13    Number of Visits 16    Date for Recertification  03/14/24    Authorization Type [employer] MCR    Authorization Time Period 03/03/24 to 03/12/24    Authorization - Visit Number 1    Authorization - Number of Visits 2    Progress Note Due on Visit 20    PT Start Time 0845    PT Stop Time 0927    PT Time Calculation (min) 42 min    Activity Tolerance Patient tolerated treatment well    Behavior During Therapy [facility] for tasks assessed/performed                   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, nickel  03/31/2017   Anxiety    Arthritis    knees   Cancer (HCC)    basal cell- nose   GERD (gastroesophageal reflux disease)    Hyperlipidemia    Hypertension    Meningioma (HCC)    Neuromuscular disorder (HCC)    benign tremor- takes Propanolol   PONV (postoperative nausea and vomiting)    BP DROPS ALSO   Trigeminal neuralgia of right side of face    Past Surgical History:  Procedure Laterality Date   69 HOUR PH STUDY N/A 04/17/2016   Procedure: 24 HOUR PH STUDY;  Surgeon: Elspeth Deward Naval, MD;  Location: [facility];  Service: Gastroenterology;  Laterality: N/A;   CHOLECYSTECTOMY N/A 08/18/2023   Procedure: LAPAROSCOPIC CHOLECYSTECTOMY;  Surgeon: Tanda Locus, MD;  Location: [facility];  Service: General;  Laterality: N/A;   ESOPHAGEAL MANOMETRY N/A 04/17/2016   Procedure: ESOPHAGEAL MANOMETRY (EM);  Surgeon: Elspeth Deward Naval, MD;  Location: [facility];  Service: Gastroenterology;  Laterality: N/A;   ESOPHAGOGASTRODUODENOSCOPY N/A 08/16/2023   Procedure: EGD (ESOPHAGOGASTRODUODENOSCOPY);  Surgeon: Abran Norleen SAILOR, MD;  Location: [facility];  Service: Gastroenterology;  Laterality: N/A;   EXTERNAL  FIXATION LEG Left 03/28/2017   Tibia   EXTERNAL FIXATION LEG Left 03/28/2017   Procedure: EXTERNAL FIXATION LEG;  Surgeon: Beverley Evalene BIRCH, MD;  Location: [facility];  Service: Orthopedics;  Laterality: Left;   EXTERNAL FIXATION REMOVAL Left 05/08/2017   Procedure: REMOVAL EXTERNAL FIXATION LEFT  LEG WITH HARDWARE REMOVAL;  Surgeon: Beverley Evalene BIRCH, MD;  Location: [facility];  Service: Orthopedics;  Laterality: Left;   fractured femur  05/2012   left - rod -screws placed   HARDWARE REMOVAL Left 04/16/2013   Procedure: REMOVAL OF HARDWARE OF LEFT KNEE (DISTAL INTERLOC SCREW);  Surgeon: Dempsey LULLA Moan, MD;  Location: [facility];  Service: Orthopedics;  Laterality: Left;   INDOCYANINE GREEN  FLUORESCENCE IMAGING (ICG) N/A 08/18/2023   Procedure: INDOCYANINE GREEN  FLUORESCENCE IMAGING (ICG);  Surgeon: Tanda Locus, MD;  Location: [facility];  Service: General;  Laterality: N/A;   KNEE SURGERY     x 3 left / x1 rt knee   LEFT HEART CATH AND CORONARY ANGIOGRAPHY N/A 09/18/2016   Procedure: Left Heart Cath and Coronary Angiography;  Surgeon: Wonda Sharper, MD;  Location: [facility];  Service: Cardiovascular;  Laterality: N/A;   NASAL SINUS SURGERY     2000   OTHER SURGICAL HISTORY  2014   titanium  rod in left femur used for fracture repair   SHOULDER SURGERY     bilateral shoulders    WISDOM TOOTH EXTRACTION     Patient Active Problem List   Diagnosis Date Noted   Abdominal pain 08/16/2023   Calculus of gallbladder without cholecystitis without obstruction 08/16/2023   Recurrent biliary colic 08/16/2023   Hiatal hernia 08/16/2023   Degenerative arthritis of right knee 09/28/2022   Leg pain 08/08/2022   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, nickel  03/31/2017   Closed left tibial fracture 03/28/2017   Closed fracture of left tibial plateau 03/28/2017   Nonintractable headache 01/25/2017   Visual field defect    Chest pain 09/16/2016   Essential tremor 09/16/2016   Right facial numbness  09/16/2016   Bilateral scleritis 08/29/2016   Obesity 06/01/2016   Dyspnea on exertion 05/13/2016   Hoarseness 03/17/2016   Gastroesophageal reflux disease without esophagitis 03/17/2016   Globus sensation 03/17/2016   Gastritis 02/25/2016   Atypical chest pain 02/24/2016   Painful orthopaedic hardware 04/15/2013   Vitamin D deficiency 12/09/2009   HYPERCHOLESTEROLEMIA 12/09/2009   Essential hypertension 12/09/2009   Allergic rhinitis 12/09/2009   NEPHROLITHIASIS 12/09/2009   CERVICAL POLYP 12/09/2009   Osteoarthritis 12/09/2009   Cough variant asthma vs UACS  12/09/2009     REFERRING PROVIDER: Margeret Kotyk, MD   REFERRING DIAG: M79.606 (ICD-10-CM) - Generalized pain of knee region   THERAPY DIAG:  Muscle weakness (generalized)  Chronic pain of left knee  Chronic pain of right knee  Stiffness of right knee, not elsewhere classified  Stiffness of left knee, not elsewhere classified  Difficulty in walking, not elsewhere classified  Pain in right leg  Rationale for Evaluation and Treatment: Rehabilitation  ONSET DATE: chronic sx's ; PT order 12/25/2023  SUBJECTIVE:   SUBJECTIVE STATEMENT:  I had PT Friday and went home and then before the end of the day my neck went crazy to the point I couldn't move my head. Went to the ortho emergency walk-in at [facility], they did a xray and told me I have OA and bone spurs in my neck (both known to me). They gave me cortisone and another shot in my neck and my hips. Sunday I felt great, I hope it lasts. I don't know what happened to my neck, I'm dealing with enough. Pain in R leg is better after that but L leg is still painful. The doctor also said that she would send in a referral for my neck.    PERTINENT HISTORY: PMHx:  Hx of L femur and tibia fractures with multiple surgeries including hardware removal (last surgery in 2019); R knee arthroscopic surgery in the 90's ; Allergy to nickel and autoimmune disorder,  osteoporosis though Pt states it has improved and may be osteopenia ; OA in knees, cervical pain, bilat shoulder surgeries,  R achilles pain due to a reaction from statins, meningioma, and hx trigeminal neuralgia   Gallbladder removal 06/25  PAIN:   3-4/10 in bilat knees and cervical Pain is mostly in knees, some skin soreness from massage Massage helps the most Overdoing and being up on feet makes it worse, unsure what makes cervical worse    PRECAUTIONS: Other: Hx of L LE fractures/surgeries, allergy to nickel, osteoporosis   WEIGHT BEARING RESTRICTIONS: No  FALLS:  Has patient fallen in last 6 months? No  LIVING ENVIRONMENT: Lives with: lives with their spouse Lives in: House/apartment Stairs: 2 STE in garage, 4 STE B rails; no steps inside home  PLOF: Independent  PATIENT GOALS: to be able to stand without pain.  Improved walking after sitting   OBJECTIVE:  Note: Objective measures were completed at Evaluation unless otherwise noted.  DIAGNOSTIC FINDINGS:   PATIENT SURVEYS:  LEFS:  22/80 (Eval)  23/80 (02/15/24)  COGNITION: Overall cognitive status: Within functional limits for tasks assessed       LOWER EXTREMITY ROM:  AROM/PROM Right eval Left eval Right AROM 01/22/24 Left AROM 01/22/24 Right 12/5 Left 12/5  Hip flexion        Hip extension        Hip abduction        Hip adduction        Hip internal rotation        Hip external rotation        Knee flexion 115 121 110* 116* 121 with 4/10 pain 117 deg with 3/10 pain  Knee extension 20/17 5/3 18* 5* 18 deg with 4/10 pain 6 deg with 3/10 pain  Ankle dorsiflexion        Ankle plantarflexion        Ankle inversion        Ankle eversion         (Blank rows = not tested)  LOWER EXTREMITY MMT:  MMT Right eval Left eval Right 12/5 Left 12/5  Hip flexion 4/5 4+/5 4+/5 5/5  Hip extension      Hip abduction 4-5/ with pain Unable to tolerate resistance, painful 4+/5 with pain 3+/5  Hip  adduction      Hip internal rotation      Hip external rotation 5/5 4-/5  4+/5  Knee flexion 5/5 seated 5/5 seated    Knee extension 4/5 4-/5 4/5 4-/5  Ankle dorsiflexion      Ankle plantarflexion      Ankle inversion      Ankle eversion       (Blank rows = not tested)    FUNCTIONAL TESTS:  5x STS test:  19.30 sec without Ue's (Initial) / 19 sec without Ue's  (02/15/24) increased R > L knee pain   GAIT: Comments:  She has a very slow gait speed.  Increased knee IR on L and decreased flexion and extension t/o gaity cycle.                                                                                                                                 TREATMENT:     03/03/24  Nustep L3x6 minutes all four extremities seat 9 Narrow BOS blue foam EO 2x30 seconds  Regular BOS EC 2x30 seconds blue foam  Forward/backward wt shifts blue foam 2x10 switching feet between sets   Seated clams green TB 2x15  LAQs 2x10 B 2# alternating    Tennis ball massage L lateral thigh with LEs elevated    02/29/2024 Nustep L3x6 minutes bilat UE/LE's  Sidestepping in hallway 2x10 with UE support on wall [address]  on airex with 1 UE support x10, 3 reps Staggered stance standing on airex 2x30 sec each   Standing HS curls 2x10 Standing wt shifts on airex s/s without UE support 2x10 LAQs  1.5# 2x10 B  STM to bilat quads and ITB in supine with bilat LE's elevated      02/26/24 Nustep L3x6 minutes seat 9 all four extremities  STM to R quad, HS, and ITB and L ITB in supine with bilat LE's elevated on gray bolster LAQ R:  1.5# 2 x10, L:  1.5# 2x10 Standing hamstring curls 1.5# 1 x 10    02/22/24 Nustep L3x6 minutes seat 9 all four extremities  PT educated pt with appropriate set up and positioning on upright bike.  Pt used upright bike lvl 0 x 3 mins Sidestepping x 3 laps at rail with UE support on rail Staggered stance standing on airex 2x30 sec each   LAQs  1.5# 2x10 B  STM to bilat  quads and ITB in supine with bilat LE's elevated    02/19/24  Nustep L3x8 minutes seat 9 all four extremities  Lateral wt shifts on blue foam x2 min, minimal use of UEs  Wide tandem blue foam 4x30 seconds alternating   LAQs 1# x10 B, 1.5# 2x10 B Seated clams green TB 2x15   Tennis ball massage R lateral thigh and HS proximal attachment points     02/15/2024 Reviewed current function, pain levels, response to prior treatment, and HEP compliance.  Nustep lvl 3 bilat UE/LE's x 6 mins Assessed knee ROM, LE strength, and 5x STS test.  See above. Pt completed LEFS.  See above  STM to L quad and ITB in supine with bilat LE's elevated on gray bolster     01/28/24 Reviewed pt presentation, HEP compliance, pain levels and response to prior treatment.  Nustep lvl 3 x 5 mins bilat UE/Les STM to R quad, HS, and ITB and L ITB in supine with bilat LE's elevated on gray bolster LAQ R:  x10, 1# x10, L:  1.5# 2x10 Seated hip abd with with GTB 2x15 Standing wt shifts on airex s/s  Standing on airex with NBOS x 1 min Sidestepping x 2 laps with UE support     PATIENT EDUCATION:  Education details: dx, exercise form, relevant anatomy, POC, and rationale of interventions.  PT answered pt's questions.   Person educated: Patient Education method: Explanation, demonstration, verbal and tactile cues Education comprehension: verbalized understanding and needs further education, verbal and tactile cues required, returned demonstration  HOME EXERCISE PROGRAM: Access Code: [email or web address] URL: [email or web address] Date: 01/08/2024 Prepared by: Mose Minerva  Exercises - Seated Long Arc Quad  - 1 x daily - 7 x weekly - 2 sets - 10 reps - Supine Short Arc Quad  - 1 x daily - 7 x weekly - 2 sets - 10 reps - Seated knee extension Stretch  - 2 x daily - 7 x weekly - 1 reps - 2 minutes hold - Supine March  - 1 x daily - 7 x weekly - 2 sets - 15 reps - Seated Hip Abduction with Resistance   - 1 x daily - 4 x weekly - 2 sets - 10 reps  ASSESSMENT:  CLINICAL IMPRESSION:  Arrives today doing OK, she is feeling a little better after injections she received from [facility] emergency care. Continues to struggle with exercise tolerance, many activities continue to be limited by pain. She does get some benefit from manual but her  pain consistently returns. Continue to recommend likely DC next visit due to limited progress and having maxed out benefit from skilled PT services.    OBJECTIVE IMPAIRMENTS: Abnormal gait, decreased activity tolerance, decreased endurance, decreased mobility, difficulty walking, decreased ROM, decreased strength, hypomobility, impaired flexibility, and pain.   ACTIVITY LIMITATIONS: standing, squatting, stairs, transfers, and locomotion level  PARTICIPATION LIMITATIONS: meal prep, cleaning, laundry, shopping, and community activity  PERSONAL FACTORS: Fitness, Time since onset of injury/illness/exacerbation, and 3+ comorbidities: Hx of L femur and tibia fractures with multiple surgeries, autoimmune disorder, osteoporosis though Pt states it has improved and may be osteopenia ; OA in knees, R achilles pain are also affecting patient's functional outcome.   REHAB POTENTIAL: Fair prior responses to PT, chronic condition  CLINICAL DECISION MAKING: Evolving/moderate complexity  EVALUATION COMPLEXITY: Moderate   GOALS:   SHORT TERM GOALS: Target date:  01/22/2024 Pt will be independent and compliant with HEP for improved pain, strength, ROM, and function.  Baseline: Goal status: MET 01/22/24  2.  Pt will demo improved L knee extension AROM to 0 deg for improved gait and stiffness.  Baseline:  Goal status: NOT MET   3.  Pt will be able to stand for 7-8 mins with < 6/10 pain. Baseline:  Goal status: NOT MET  4.  Pt will report at least a 25% improvement in pain and sx's overall.   Baseline:  Goal status: GOAL MET  02/15/24   LONG TERM GOALS:  Target date: 03/14/2024   Pt will report she is able to stand for longer duration to perform household chores with decreased seated rest breaks.  Baseline:  Goal status: ONGOING  2.  Pt will be able to stand for the majority of time while she is cooking without significant increased pain. Baseline:  Goal status: ONGOING  3.  Pt will be able to perform 5x STS test in < than 14 sec for improved functional LE strength and performance of transfers.  Baseline:  Goal status: NOT MET    4.  Pt will report at least a 70% improvement with her daily ambulation.   Baseline:  Goal status: ONGOING  5.  Pt will report she is able to stand 15 mins without significant increased pain.  Baseline:  Goal status: ONGOING  6.  Pt will demo improved LE strength to 5/5 in bilat  knee extension and hip flexion and 4/5 in L hip abduction for improved performance of and tolerance with functional mobility.  Baseline:  Goal status: ONGOING   PLAN:  PT FREQUENCY: 2x/week  PT DURATION: 4 weeks  PLANNED INTERVENTIONS: [id number]- PT Re-evaluation, 97750- Physical Performance Testing, 97110-Therapeutic exercises, 97530- Therapeutic activity, [id number]- Neuromuscular re-education, 97535- Self Care, [id number]- Manual therapy, [id number]- Gait training, [phone]- Aquatic Therapy, [phone]- Electrical stimulation (unattended), [phone] (1-2 muscles), [id number] (3+ muscles)- Dry Needling, Patient/Family education, Balance training, Stair training, Taping, Cryotherapy, and Moist heat  PLAN FOR NEXT SESSION:    Pt has a nickel allergy.  DC next visit- if neck referral is in, could consider brief work up and HEP and include this in final DC HEP     Josette Rough, PT, DPT 03/03/2024 9:27 AM     "

## 2024-03-05 ENCOUNTER — Encounter (HOSPITAL_BASED_OUTPATIENT_CLINIC_OR_DEPARTMENT_OTHER): Admitting: Physical Therapy

## 2024-03-11 ENCOUNTER — Encounter (HOSPITAL_BASED_OUTPATIENT_CLINIC_OR_DEPARTMENT_OTHER): Payer: Self-pay | Admitting: Physical Therapy

## 2024-03-11 ENCOUNTER — Ambulatory Visit (HOSPITAL_BASED_OUTPATIENT_CLINIC_OR_DEPARTMENT_OTHER): Admitting: Physical Therapy

## 2024-03-11 DIAGNOSIS — M25562 Pain in left knee: Secondary | ICD-10-CM | POA: Diagnosis not present

## 2024-03-11 DIAGNOSIS — M25661 Stiffness of right knee, not elsewhere classified: Secondary | ICD-10-CM

## 2024-03-11 DIAGNOSIS — M79604 Pain in right leg: Secondary | ICD-10-CM

## 2024-03-11 DIAGNOSIS — R262 Difficulty in walking, not elsewhere classified: Secondary | ICD-10-CM

## 2024-03-11 DIAGNOSIS — M25662 Stiffness of left knee, not elsewhere classified: Secondary | ICD-10-CM

## 2024-03-11 DIAGNOSIS — M6281 Muscle weakness (generalized): Secondary | ICD-10-CM

## 2024-03-11 DIAGNOSIS — G8929 Other chronic pain: Secondary | ICD-10-CM

## 2024-03-11 NOTE — Therapy (Signed)
 " OUTPATIENT PHYSICAL THERAPY LOWER EXTREMITY TREATMENT        Patient Name: Barbara [name] MRN: [id number] DOB:1950-07-27, 73 y.o., female Today's Date: 03/11/2024  PHYSICAL THERAPY DISCHARGE SUMMARY  Visits from Start of Care: 14  Current functional level related to goals / functional outcomes: See below   Remaining deficits: See below   Education / Equipment: See below   Patient agrees to discharge. Patient goals were partially met. Patient is being discharged due to did not respond to therapy.    END OF SESSION:  PT End of Session - 03/11/24 1437     Visit Number 14    Number of Visits 16    Date for Recertification  03/14/24    Authorization Type [employer] M[employer]    Authorization Time Period 03/03/24 to 03/12/24    Authorization - Visit Number 2    Authorization - Number of Visits 2    Progress Note Due on Visit 20    PT Start Time 1437    PT Stop Time 1515    PT Time Calculation (min) 38 min    Activity Tolerance Patient tolerated treatment well    Behavior During Therapy [facility] for tasks assessed/performed                   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, nickel  03/31/2017   Anxiety    Arthritis    knees   Cancer (HCC)    basal cell- nose   GERD (gastroesophageal reflux disease)    Hyperlipidemia    Hypertension    Meningioma (HCC)    Neuromuscular disorder (HCC)    benign tremor- takes Propanolol   PONV (postoperative nausea and vomiting)    BP DROPS ALSO   Trigeminal neuralgia of right side of face    Past Surgical History:  Procedure Laterality Date   80 HOUR PH STUDY N/A 04/17/2016   Procedure: 24 HOUR PH STUDY;  Surgeon: Elspeth Deward Naval, MD;  Location: [facility];  Service: Gastroenterology;  Laterality: N/A;   CHOLECYSTECTOMY N/A 08/18/2023   Procedure: LAPAROSCOPIC CHOLECYSTECTOMY;  Surgeon: Tanda Locus, MD;  Location: [facility];  Service: General;  Laterality: N/A;   ESOPHAGEAL MANOMETRY N/A  04/17/2016   Procedure: ESOPHAGEAL MANOMETRY (EM);  Surgeon: Elspeth Deward Naval, MD;  Location: [facility];  Service: Gastroenterology;  Laterality: N/A;   ESOPHAGOGASTRODUODENOSCOPY N/A 08/16/2023   Procedure: EGD (ESOPHAGOGASTRODUODENOSCOPY);  Surgeon: Abran Norleen SAILOR, MD;  Location: [facility];  Service: Gastroenterology;  Laterality: N/A;   EXTERNAL FIXATION LEG Left 03/28/2017   Tibia   EXTERNAL FIXATION LEG Left 03/28/2017   Procedure: EXTERNAL FIXATION LEG;  Surgeon: Beverley Evalene BIRCH, MD;  Location: [facility];  Service: Orthopedics;  Laterality: Left;   EXTERNAL FIXATION REMOVAL Left 05/08/2017   Procedure: REMOVAL EXTERNAL FIXATION LEFT  LEG WITH HARDWARE REMOVAL;  Surgeon: Beverley Evalene BIRCH, MD;  Location: [facility];  Service: Orthopedics;  Laterality: Left;   fractured femur  05/2012   left - rod -screws placed   HARDWARE REMOVAL Left 04/16/2013   Procedure: REMOVAL OF HARDWARE OF LEFT KNEE (DISTAL INTERLOC SCREW);  Surgeon: Dempsey LULLA Moan, MD;  Location: [facility];  Service: Orthopedics;  Laterality: Left;   INDOCYANINE GREEN  FLUORESCENCE IMAGING (ICG) N/A 08/18/2023   Procedure: INDOCYANINE GREEN  FLUORESCENCE IMAGING (ICG);  Surgeon: Tanda Locus, MD;  Location: [facility];  Service: General;  Laterality: N/A;   KNEE SURGERY     x 3  left / x1 rt knee   LEFT HEART CATH AND CORONARY ANGIOGRAPHY N/A 09/18/2016   Procedure: Left Heart Cath and Coronary Angiography;  Surgeon: Wonda Sharper, MD;  Location: [facility];  Service: Cardiovascular;  Laterality: N/A;   NASAL SINUS SURGERY     2000   OTHER SURGICAL HISTORY  2014   titanium rod in left femur used for fracture repair   SHOULDER SURGERY     bilateral shoulders    WISDOM TOOTH EXTRACTION     Patient Active Problem List   Diagnosis Date Noted   Abdominal pain 08/16/2023   Calculus of gallbladder without cholecystitis without obstruction 08/16/2023   Recurrent biliary colic 08/16/2023   Hiatal hernia  08/16/2023   Degenerative arthritis of right knee 09/28/2022   Leg pain 08/08/2022   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, nickel  03/31/2017   Closed left tibial fracture 03/28/2017   Closed fracture of left tibial plateau 03/28/2017   Nonintractable headache 01/25/2017   Visual field defect    Chest pain 09/16/2016   Essential tremor 09/16/2016   Right facial numbness 09/16/2016   Bilateral scleritis 08/29/2016   Obesity 06/01/2016   Dyspnea on exertion 05/13/2016   Hoarseness 03/17/2016   Gastroesophageal reflux disease without esophagitis 03/17/2016   Globus sensation 03/17/2016   Gastritis 02/25/2016   Atypical chest pain 02/24/2016   Painful orthopaedic hardware 04/15/2013   Vitamin D deficiency 12/09/2009   HYPERCHOLESTEROLEMIA 12/09/2009   Essential hypertension 12/09/2009   Allergic rhinitis 12/09/2009   NEPHROLITHIASIS 12/09/2009   CERVICAL POLYP 12/09/2009   Osteoarthritis 12/09/2009   Cough variant asthma vs UACS  12/09/2009     REFERRING PROVIDER: Margeret Kotyk, MD   REFERRING DIAG: M79.606 (ICD-10-CM) - Generalized pain of knee region   THERAPY DIAG:  Muscle weakness (generalized)  Chronic pain of left knee  Chronic pain of right knee  Stiffness of right knee, not elsewhere classified  Stiffness of left knee, not elsewhere classified  Difficulty in walking, not elsewhere classified  Pain in right leg  Rationale for Evaluation and Treatment: Rehabilitation  ONSET DATE: chronic sx's ; PT order 12/25/2023  SUBJECTIVE:   SUBJECTIVE STATEMENT: Patient states knee has been painful since last session. L knee is not showing improvement at all with therapy. Deep tissue work has been the most helpful. Patient states R leg feels 50% improvement, left leg has gone backwards.     PERTINENT HISTORY: PMHx:  Hx of L femur and tibia fractures with multiple surgeries including hardware removal (last surgery in 2019); R knee arthroscopic surgery in the 90's ; Allergy to nickel  and autoimmune disorder, osteoporosis though Pt states it has improved and may be osteopenia ; OA in knees, cervical pain, bilat shoulder surgeries,  R achilles pain due to a reaction from statins, meningioma, and hx trigeminal neuralgia   Gallbladder removal 06/25  PAIN:   3-4/10 in bilat knees and cervical Pain is mostly in knees, some skin soreness from massage Massage helps the most Overdoing and being up on feet makes it worse, unsure what makes cervical worse    PRECAUTIONS: Other: Hx of L LE fractures/surgeries, allergy to nickel, osteoporosis   WEIGHT BEARING RESTRICTIONS: No  FALLS:  Has patient fallen in last 6 months? No  LIVING ENVIRONMENT: Lives with: lives with their spouse Lives in: House/apartment Stairs: 2 STE in garage, 4 STE B rails; no steps inside home     PLOF: Independent  PATIENT GOALS: to be able to stand without pain.  Improved walking after sitting   OBJECTIVE:  Note: Objective measures were completed at Evaluation unless otherwise noted.  DIAGNOSTIC FINDINGS:   PATIENT SURVEYS:  LEFS:  22/80 (Eval)  23/80 (02/15/24) 03/11/24: 27/80  COGNITION: Overall cognitive status: Within functional limits for tasks assessed       LOWER EXTREMITY ROM:  AROM/PROM Right eval Left eval Right AROM 01/22/24 Left AROM 01/22/24 Right 12/5 Left 12/5 Right 03/11/24 Left 03/11/24  Hip flexion          Hip extension          Hip abduction          Hip adduction          Hip internal rotation          Hip external rotation          Knee flexion 115 121 110* 116* 121 with 4/10 pain 117 deg with 3/10 pain    Knee extension 20/17 5/3 18* 5* 18 deg with 4/10 pain 6 deg with 3/10 pain 18 deg 6 deg  Ankle dorsiflexion          Ankle plantarflexion          Ankle inversion          Ankle eversion           (Blank rows = not tested)  LOWER EXTREMITY MMT:  MMT Right eval Left eval Right 12/5 Left 12/5 Right 03/11/24  Left 03/11/24  Hip flexion  4/5 4+/5 4+/5 5/5 5 5   Hip extension        Hip abduction 4-5/ with pain Unable to tolerate resistance, painful 4+/5 with pain 3+/5 5/5 seated 4/5 seated  Hip adduction        Hip internal rotation        Hip external rotation 5/5 4-/5  4+/5    Knee flexion 5/5 seated 5/5 seated   5 4+  Knee extension 4/5 4-/5 4/5 4-/5 5 4+  Ankle dorsiflexion        Ankle plantarflexion        Ankle inversion        Ankle eversion         (Blank rows = not tested)    FUNCTIONAL TESTS:  5x STS test:  19.30 sec without Ue's (Initial) / 19 sec without Ue's  (02/15/24) increased R > L knee pain 03/11/24 40 seconds with UE support after first 2 reps due to pain  GAIT: Comments:  She has a very slow gait speed.  Increased knee IR on L and decreased flexion and extension t/o gaity cycle.                                                                                                                                 TREATMENT:   03/11/24 Reassessment Nustep L3x6 minutes bilat UE/LE's  Discussion of POC  03/03/24  Nustep L3x6 minutes all four extremities seat  9 Narrow BOS blue foam EO 2x30 seconds  Regular BOS EC 2x30 seconds blue foam  Forward/backward wt shifts blue foam 2x10 switching feet between sets   Seated clams green TB 2x15  LAQs 2x10 B 2# alternating    Tennis ball massage L lateral thigh with LEs elevated    02/29/2024 Nustep L3x6 minutes bilat UE/LE's  Sidestepping in hallway 2x10 with UE support on wall Marches on airex with 1 UE support x10, 3 reps Staggered stance standing on airex 2x30 sec each   Standing HS curls 2x10 Standing wt shifts on airex s/s without UE support 2x10 LAQs  1.5# 2x10 B  STM to bilat quads and ITB in supine with bilat LE's elevated      02/26/24 Nustep L3x6 minutes seat 9 all four extremities  STM to R quad, HS, and ITB and L ITB in supine with bilat LE's elevated on gray bolster LAQ R:  1.5# 2 x10, L:  1.5# 2x10 Standing hamstring curls 1.5#  1 x 10    02/22/24 Nustep L3x6 minutes seat 9 all four extremities  PT educated pt with appropriate set up and positioning on upright bike.  Pt used upright bike lvl 0 x 3 mins Sidestepping x 3 laps at rail with UE support on rail Staggered stance standing on airex 2x30 sec each   LAQs  1.5# 2x10 B  STM to bilat quads and ITB in supine with bilat LE's elevated    02/19/24  Nustep L3x8 minutes seat 9 all four extremities  Lateral wt shifts on blue foam x2 min, minimal use of UEs  Wide tandem blue foam 4x30 seconds alternating   LAQs 1# x10 B, 1.5# 2x10 B Seated clams green TB 2x15   Tennis ball massage R lateral thigh and HS proximal attachment points     02/15/2024 Reviewed current function, pain levels, response to prior treatment, and HEP compliance.  Nustep lvl 3 bilat UE/LE's x 6 mins Assessed knee ROM, LE strength, and 5x STS test.  See above. Pt completed LEFS.  See above  STM to L quad and ITB in supine with bilat LE's elevated on gray bolster     01/28/24 Reviewed pt presentation, HEP compliance, pain levels and response to prior treatment.  Nustep lvl 3 x 5 mins bilat UE/Les STM to R quad, HS, and ITB and L ITB in supine with bilat LE's elevated on gray bolster LAQ R:  x10, 1# x10, L:  1.5# 2x10 Seated hip abd with with GTB 2x15 Standing wt shifts on airex s/s  Standing on airex with NBOS x 1 min Sidestepping x 2 laps with UE support     PATIENT EDUCATION:  Education details: dx, exercise form, relevant anatomy, POC, and rationale of interventions.  PT answered pt's questions.   Person educated: Patient Education method: Explanation, demonstration, verbal and tactile cues Education comprehension: verbalized understanding and needs further education, verbal and tactile cues required, returned demonstration  HOME EXERCISE PROGRAM: Access Code: [email or web address] URL: [email or web address] Date: 01/08/2024 Prepared by: Mose Minerva  Exercises - Seated Long Arc Quad  - 1 x daily - 7 x weekly - 2 sets - 10 reps - Supine Short Arc Quad  - 1 x daily - 7 x weekly - 2 sets - 10 reps - Seated knee extension Stretch  - 2 x daily - 7 x weekly - 1 reps - 2 minutes hold - Supine March  - 1 x daily - 7 x weekly - 2 sets -  15 reps - Seated Hip Abduction with Resistance  - 1 x daily - 4 x weekly - 2 sets - 10 reps  ASSESSMENT:  CLINICAL IMPRESSION:  Patient has met 2/4 short term goals and 0/6 long term goals with ability to complete HEP and improvement in symptoms. Remaining goals not met due to continued deficits in symptoms, strength, ROM, activity tolerance, gait, balance, and functional mobility. Patient has made mild progress in strength but very limited progress overall on LLE. Patient with referral for her neck pain. Will d/c from PT for her knees and patient will return for further cervical evaluation when able.    OBJECTIVE IMPAIRMENTS: Abnormal gait, decreased activity tolerance, decreased endurance, decreased mobility, difficulty walking, decreased ROM, decreased strength, hypomobility, impaired flexibility, and pain.   ACTIVITY LIMITATIONS: standing, squatting, stairs, transfers, and locomotion level  PARTICIPATION LIMITATIONS: meal prep, cleaning, laundry, shopping, and community activity  PERSONAL FACTORS: Fitness, Time since onset of injury/illness/exacerbation, and 3+ comorbidities: Hx of L femur and tibia fractures with multiple surgeries, autoimmune disorder, osteoporosis though Pt states it has improved and may be osteopenia ; OA in knees, R achilles pain are also affecting patient's functional outcome.   REHAB POTENTIAL: Fair prior responses to PT, chronic condition  CLINICAL DECISION MAKING: Evolving/moderate complexity  EVALUATION COMPLEXITY: Moderate   GOALS:   SHORT TERM GOALS: Target date:  01/22/2024 Pt will be independent and compliant with HEP for improved pain, strength, ROM, and  function.  Baseline: Goal status: MET 01/22/24  2.  Pt will demo improved L knee extension AROM to 0 deg for improved gait and stiffness.  Baseline:  Goal status: NOT MET   3.  Pt will be able to stand for 7-8 mins with < 6/10 pain. Baseline:  Goal status: NOT MET  4.  Pt will report at least a 25% improvement in pain and sx's overall.   Baseline:  Goal status: GOAL MET  02/15/24   LONG TERM GOALS: Target date: 03/14/2024   Pt will report she is able to stand for longer duration to perform household chores with decreased seated rest breaks.  Baseline:  Goal status: NOT MET  2.  Pt will be able to stand for the majority of time while she is cooking without significant increased pain. Baseline:  Goal status: NOT MET  3.  Pt will be able to perform 5x STS test in < than 14 sec for improved functional LE strength and performance of transfers.  Baseline:  Goal status: NOT MET    4.  Pt will report at least a 70% improvement with her daily ambulation.   Baseline:  Goal status: NOT MET  5.  Pt will report she is able to stand 15 mins without significant increased pain.  Baseline:  Goal status: NOT MET  6.  Pt will demo improved LE strength to 5/5 in bilat  knee extension and hip flexion and 4/5 in L hip abduction for improved performance of and tolerance with functional mobility.  Baseline:  Goal status: ONGOING   PLAN:  PT FREQUENCY: 2x/week  PT DURATION: 4 weeks  PLANNED INTERVENTIONS: [id number]- PT Re-evaluation, 97750- Physical Performance Testing, 97110-Therapeutic exercises, 97530- Therapeutic activity, [id number]- Neuromuscular re-education, 97535- Self Care, [id number]- Manual therapy, [id number]- Gait training, [phone]- Aquatic Therapy, [phone]- Electrical stimulation (unattended), [phone] (1-2 muscles), [id number] (3+ muscles)- Dry Needling, Patient/Family education, Balance training, Stair training, Taping, Cryotherapy, and Moist heat  PLAN FOR NEXT SESSION:    Pt has a nickel allergy.  Prentice RAMAN Kylar Leonhardt, PT, DPT 03/11/2024, 3:11 PM     "

## 2024-03-14 ENCOUNTER — Encounter: Attending: Family Medicine | Admitting: Dietician

## 2024-03-14 ENCOUNTER — Encounter: Payer: Self-pay | Admitting: Dietician

## 2024-03-14 ENCOUNTER — Other Ambulatory Visit: Payer: Self-pay

## 2024-03-14 ENCOUNTER — Ambulatory Visit (HOSPITAL_BASED_OUTPATIENT_CLINIC_OR_DEPARTMENT_OTHER): Attending: Orthopedic Surgery | Admitting: Physical Therapy

## 2024-03-14 ENCOUNTER — Encounter (HOSPITAL_BASED_OUTPATIENT_CLINIC_OR_DEPARTMENT_OTHER): Payer: Self-pay | Admitting: Physical Therapy

## 2024-03-14 DIAGNOSIS — M6281 Muscle weakness (generalized): Secondary | ICD-10-CM | POA: Diagnosis present

## 2024-03-14 DIAGNOSIS — R29898 Other symptoms and signs involving the musculoskeletal system: Secondary | ICD-10-CM | POA: Insufficient documentation

## 2024-03-14 DIAGNOSIS — R7303 Prediabetes: Secondary | ICD-10-CM | POA: Insufficient documentation

## 2024-03-14 DIAGNOSIS — M542 Cervicalgia: Secondary | ICD-10-CM | POA: Insufficient documentation

## 2024-03-14 NOTE — Therapy (Signed)
 " OUTPATIENT PHYSICAL THERAPY CERVICAL EVALUATION   Patient Name: Barbara [name] MRN: [id number] DOB:Jan 01, 1951, 74 y.o., female Today's Date: 03/14/2024  END OF SESSION:  PT End of Session - 03/14/24 0719     Visit Number 1    Number of Visits 16    Date for Recertification  05/09/24    Authorization Type [employer] MCR    Progress Note Due on Visit 10    PT Start Time 0717    PT Stop Time 0758    PT Time Calculation (min) 41 min    Activity Tolerance Patient tolerated treatment well    Behavior During Therapy [facility] for tasks assessed/performed          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, nickel  03/31/2017   Anxiety    Arthritis    knees   Cancer (HCC)    basal cell- nose   GERD (gastroesophageal reflux disease)    Hyperlipidemia    Hypertension    Meningioma (HCC)    Neuromuscular disorder (HCC)    benign tremor- takes Propanolol   PONV (postoperative nausea and vomiting)    BP DROPS ALSO   Trigeminal neuralgia of right side of face    Past Surgical History:  Procedure Laterality Date   89 HOUR PH STUDY N/A 04/17/2016   Procedure: 24 HOUR PH STUDY;  Surgeon: Elspeth Deward Naval, MD;  Location: [facility];  Service: Gastroenterology;  Laterality: N/A;   CHOLECYSTECTOMY N/A 08/18/2023   Procedure: LAPAROSCOPIC CHOLECYSTECTOMY;  Surgeon: Tanda Locus, MD;  Location: [facility];  Service: General;  Laterality: N/A;   ESOPHAGEAL MANOMETRY N/A 04/17/2016   Procedure: ESOPHAGEAL MANOMETRY (EM);  Surgeon: Elspeth Deward Naval, MD;  Location: [facility];  Service: Gastroenterology;  Laterality: N/A;   ESOPHAGOGASTRODUODENOSCOPY N/A 08/16/2023   Procedure: EGD (ESOPHAGOGASTRODUODENOSCOPY);  Surgeon: Abran Norleen SAILOR, MD;  Location: [facility];  Service: Gastroenterology;  Laterality: N/A;   EXTERNAL FIXATION LEG Left 03/28/2017   [place]   EXTERNAL FIXATION LEG Left 03/28/2017   Procedure: EXTERNAL FIXATION LEG;  Surgeon: Beverley Evalene BIRCH, MD;  Location:  [facility];  Service: Orthopedics;  Laterality: Left;   EXTERNAL FIXATION REMOVAL Left 05/08/2017   Procedure: REMOVAL EXTERNAL FIXATION LEFT  LEG WITH HARDWARE REMOVAL;  Surgeon: Beverley Evalene BIRCH, MD;  Location: [facility];  Service: Orthopedics;  Laterality: Left;   fractured femur  05/2012   left - rod -screws placed   HARDWARE REMOVAL Left 04/16/2013   Procedure: REMOVAL OF HARDWARE OF LEFT KNEE (DISTAL INTERLOC SCREW);  Surgeon: Dempsey LULLA Moan, MD;  Location: [facility];  Service: Orthopedics;  Laterality: Left;   INDOCYANINE GREEN  FLUORESCENCE IMAGING (ICG) N/A 08/18/2023   Procedure: INDOCYANINE GREEN  FLUORESCENCE IMAGING (ICG);  Surgeon: Tanda Locus, MD;  Location: [facility];  Service: General;  Laterality: N/A;   KNEE SURGERY     x 3 left / x1 rt knee   LEFT HEART CATH AND CORONARY ANGIOGRAPHY N/A 09/18/2016   Procedure: Left Heart Cath and Coronary Angiography;  Surgeon: Wonda Sharper, MD;  Location: [facility];  Service: Cardiovascular;  Laterality: N/A;   NASAL SINUS SURGERY     2000   OTHER SURGICAL HISTORY  2014   titanium rod in left femur used for fracture repair   SHOULDER SURGERY     bilateral shoulders    WISDOM TOOTH EXTRACTION     Patient Active Problem List   Diagnosis Date Noted   Abdominal pain  08/16/2023   Calculus of gallbladder without cholecystitis without obstruction 08/16/2023   Recurrent biliary colic 08/16/2023   Hiatal hernia 08/16/2023   Degenerative arthritis of right knee 09/28/2022   Leg pain 08/08/2022   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, nickel  03/31/2017   Closed left tibial fracture 03/28/2017   Closed fracture of left tibial plateau 03/28/2017   Nonintractable headache 01/25/2017   Visual field defect    Chest pain 09/16/2016   Essential tremor 09/16/2016   Right facial numbness 09/16/2016   Bilateral scleritis 08/29/2016   Obesity 06/01/2016   Dyspnea on exertion 05/13/2016   Hoarseness 03/17/2016   Gastroesophageal reflux disease  without esophagitis 03/17/2016   Globus sensation 03/17/2016   Gastritis 02/25/2016   Atypical chest pain 02/24/2016   Painful orthopaedic hardware 04/15/2013   Vitamin D deficiency 12/09/2009   HYPERCHOLESTEROLEMIA 12/09/2009   Essential hypertension 12/09/2009   Allergic rhinitis 12/09/2009   NEPHROLITHIASIS 12/09/2009   CERVICAL POLYP 12/09/2009   Osteoarthritis 12/09/2009   Cough variant asthma vs UACS  12/09/2009    PCP: Lamarr Rotunda  REFERRING PROVIDER: Ted Gerard HERO, PA-C  REFERRING DIAG: M54.2 (ICD-10-CM) - Cervicalgia  THERAPY DIAG:  Cervicalgia  Other symptoms and signs involving the musculoskeletal system  Muscle weakness (generalized)  Rationale for Evaluation and Treatment: Rehabilitation  ONSET DATE: chronic  SUBJECTIVE:                                                                                                                                                                                                         SUBJECTIVE STATEMENT: Patient states injections in low back/hip area helped her neck symptoms. Patient states hx of bone spurs with symptoms bothering her for years. Was scheduled for neck surgery a few days prior to breaking her leg. Symptoms are pretty much continuous.   PERTINENT HISTORY:  PMHx:  Hx of L femur and tibia fractures with multiple surgeries including hardware removal (last surgery in 2019); R knee arthroscopic surgery in the 90's ; Allergy to nickel and autoimmune disorder, osteoporosis though Pt states it has improved and may be osteopenia ; OA in knees, cervical pain, bilat shoulder surgeries,  R achilles pain due to a reaction from statins, meningioma, and hx trigeminal neuralgia    Gallbladder removal 06/25  PAIN:  Are you having pain? Yes: NPRS scale: 3/10 Pain location: L c/sp  Pain description: constant, ache, sore Aggravating factors: constant Relieving factors: none  PRECAUTIONS: Other: Hx of L LE  fractures/surgeries, allergy to nickel, osteoporosis  WEIGHT BEARING RESTRICTIONS: No  FALLS:  Has patient  fallen in last 6 months? No   OCCUPATION: Retired  PLOF: Independent  PATIENT GOALS: stop hurting   OBJECTIVE: (objective measures from initial evaluation unless otherwise dated)  PATIENT SURVEYS:  NDI:  NECK DISABILITY INDEX  Date: 03/14/24 Score  Pain intensity 2 = The pain is moderate at the moment  2. Personal care (washing, dressing, etc.) 0 = I can look after myself normally without causing extra pain  3. Lifting 0 =  I can lift heavy weights without extra pain  4. Reading 0 = I can read as much as I want to with no pain in my neck  5. Headaches 2 =  I have moderate headaches, which come infrequently  6. Concentration 0 =  I can concentrate fully when I want to with no difficulty  7. Work 1 =  I can only do my usual work, but no more  8. Driving 2 =  I can drive my car as long as I want with moderate pain in my neck  9. Sleeping 2 = My sleep is mildly disturbed (1-2 hrs sleepless)  10. Recreation 1 =  I am able to engage in all my recreation activities, with some pain in my neck  Total 10/50   Minimum Detectable Change (90% confidence): 5 points or 10% points  COGNITION: Overall cognitive status: Within functional limits for tasks assessed  SENSATION: WFL  POSTURE: rounded shoulders and forward head  PALPATION: Grossly tender and hyperactive cervical paraspinals, bilateral UT, suboccipitals   CERVICAL ROM:   Active ROM A/PROM (deg) eval  Flexion 35  Extension 16  Right lateral flexion 15  Left lateral flexion 13  Right rotation 39  Left rotation 34   (Blank rows = not tested) *=pain/symptoms ; extension for increased time causes hands to go to sleep  UPPER EXTREMITY ROM: WFL all planes for tasks assessed   UPPER EXTREMITY MMT:  MMT Right eval Left eval  Shoulder flexion 4+ 4+  Shoulder extension    Shoulder abduction 5 5  Shoulder  adduction    Shoulder extension    Shoulder internal rotation 5 5  Shoulder external rotation 4+ 4+  Middle trapezius    Lower trapezius    Elbow flexion 5 5  Elbow extension 5 5  Wrist flexion    Wrist extension    Wrist ulnar deviation    Wrist radial deviation    Wrist pronation    Wrist supination    Grip strength     (Blank rows = not tested) *=pain/symptoms    TODAY'S TREATMENT:                                                                                                                              DATE:  03/14/24  Supine cervical retractions 2 x 10 Supine scapular retractions 2 x 10  PATIENT EDUCATION:  Education details: Patient educated on exam findings, POC, scope of PT, HEP, posture, self STM.  Person educated: Patient Education method: Explanation, Demonstration, and Handouts Education comprehension: verbalized understanding, returned demonstration, verbal cues required, and tactile cues required  HOME EXERCISE PROGRAM: Access Code: [email or web address] URL: [email or web address] Date: 03/14/2024 Prepared by: Prentice Tyriek Hofman  Exercises - Supine Chin Tuck  - 3 x daily - 7 x weekly - 2 sets - 10 reps - Seated Scapular Retraction  - 3 x daily - 7 x weekly - 2 sets - 10 reps  ASSESSMENT:  CLINICAL IMPRESSION: Patient a 74 y.o. y.o. female who was seen today for physical therapy evaluation and treatment for cervicalgia. Patient presents with pain limited deficits in cervical spine strength, ROM, endurance, activity tolerance, and functional mobility with ADL. Patient is having to modify and restrict ADL as indicated by outcome measure score as well as subjective information and objective measures which is affecting overall participation. Patient will benefit from skilled physical therapy in order to improve function and reduce impairment.  OBJECTIVE IMPAIRMENTS:  decreased activity tolerance, decreased endurance, decreased mobility, decreased ROM, decreased  strength, hypomobility, increased muscle spasms, impaired flexibility, impaired UE functional use, improper body mechanics, and pain  ACTIVITY LIMITATIONS: carrying, lifting, bending, sleeping, bed mobility, reach over head, hygiene/grooming, and caring for others  PARTICIPATION LIMITATIONS:  meal prep, cleaning, laundry, driving, shopping, community activity, and yard work  PERSONAL FACTORS: 3+ comorbidities: chronic neck pain,  autoimmune disorder, osteoporosis though Pt states it has improved and may be osteopenia ; OA in knees, R achilles pain are also affecting patient's functional outcome.   REHAB POTENTIAL: Good  CLINICAL DECISION MAKING: Evolving/moderate complexity  EVALUATION COMPLEXITY: Moderate   GOALS: Goals reviewed with patient? Yes  SHORT TERM GOALS: Target date: 04/11/2024    Patient will be independent with HEP in order to improve functional outcomes. Baseline: Goal status: INITIAL  2.  Patient will report at least 25% improvement in symptoms for improved quality of life. Baseline:  Goal status: INITIAL    LONG TERM GOALS: Target date: 05/09/2024    Patient will report at least 75% improvement in symptoms for improved quality of life. Baseline:  Goal status: INITIAL  2.  Patient will improve NDI score by at least 5 points in order to indicate improved tolerance to activity. Baseline:  Goal status: INITIAL  3.  Patient will demonstrate at least 25% improvement in cervical ROM in all restricted planes for improved ability to move head with chores. Baseline:  Goal status: INITIAL  4.  Patient will be able to return to all activities unrestricted for improved ability to perform work functions and participate with family.  Baseline:  Goal status: INITIAL       PLAN:  PT FREQUENCY: 1-2x/week  PT DURATION: 8 weeks  PLANNED INTERVENTIONS: [id number]- PT Re-evaluation, 97110-Therapeutic exercises, 97530- Therapeutic activity, [id number]- Neuromuscular  re-education, 97535- Self Care, [id number]- Manual therapy, [id number]- Gait training, [phone]- Orthotic Fit/training, [phone]- Canalith repositioning, [id number]- Aquatic Therapy, [phone]- Splinting, [phone]- Wound care (first 20 sq cm), 97598- Wound care (each additional 20 sq cm)Patient/Family education, Balance training, Stair training, Taping, Dry Needling, Joint mobilization, Joint manipulation, Spinal manipulation, Spinal mobilization, Scar mobilization, and DME instructions.  PLAN FOR NEXT SESSION: cervical mobility and postural strength, manual if needed for pain/mobility   Prentice GORMAN Stains, PT, DPT 03/14/2024, 8:01 AM  "

## 2024-03-14 NOTE — Progress Notes (Signed)
 Class start Time: 1400   Class End Time: 1500  This was a class of 3 patients.  Patient was seen on 03/14/24 for the Core Session 14 of Diabetes Prevention Program course at [facility]. By the end of this session patients are able to complete the following objectives:   Learning Objectives: Give examples of problem social cues and helpful social cues.  Explain how to remove problem social cues and add helpful ones.  Describe ways of coping with vacations and social events such as parties, holidays, and visits from relatives and friends.  Create an action plan to change a problem social cue and add a helpful one.   Goals:  Record weight taken outside of class.  Track foods and beverages eaten each day in the Food and Activity Tracker, including calories and fat grams for each item.   Track activity type, minutes you were active, and distance you reached each day in the Food and Activity Tracker.  Do your best to reach activity goal for the week. Use action plan created during session to change a problem social cue and add a helpful social cue.  Answer questions regarding success of changing social cues on To Do Next Week handout.   Follow-Up Plan: Attend Core Session 15 next week.  Bring completed Food and Activity Tracker next week to be reviewed by Lifestyle Coach.

## 2024-03-25 ENCOUNTER — Ambulatory Visit (HOSPITAL_BASED_OUTPATIENT_CLINIC_OR_DEPARTMENT_OTHER): Admitting: Physical Therapy

## 2024-03-25 DIAGNOSIS — M542 Cervicalgia: Secondary | ICD-10-CM | POA: Diagnosis not present

## 2024-03-25 DIAGNOSIS — M6281 Muscle weakness (generalized): Secondary | ICD-10-CM

## 2024-03-25 DIAGNOSIS — R29898 Other symptoms and signs involving the musculoskeletal system: Secondary | ICD-10-CM

## 2024-03-25 NOTE — Therapy (Signed)
 " OUTPATIENT PHYSICAL THERAPY CERVICAL TREATMENT   Patient Name: Barbara [name] MRN: [id number] DOB:1950/12/22, 74 y.o., female Today's Date: 03/26/2024  END OF SESSION:  PT End of Session - 03/25/24 1223     Visit Number 2    Number of Visits 16    Date for Recertification  05/09/24    Authorization Type [employer] MCR    PT Start Time 1215   Pt recently got this appt off the wait list .  PT was working with other pt and didn't realize that pt scheduled this appt.   PT Stop Time 1248    PT Time Calculation (min) 33 min    Activity Tolerance Patient tolerated treatment well    Behavior During Therapy WFL for tasks assessed/performed           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, nickel  03/31/2017   Anxiety    Arthritis    knees   Cancer (HCC)    basal cell- nose   GERD (gastroesophageal reflux disease)    Hyperlipidemia    Hypertension    Meningioma (HCC)    Neuromuscular disorder (HCC)    benign tremor- takes Propanolol   PONV (postoperative nausea and vomiting)    BP DROPS ALSO   Trigeminal neuralgia of right side of face    Past Surgical History:  Procedure Laterality Date   13 HOUR PH STUDY N/A 04/17/2016   Procedure: 24 HOUR PH STUDY;  Surgeon: Elspeth Deward Naval, MD;  Location: [facility];  Service: Gastroenterology;  Laterality: N/A;   CHOLECYSTECTOMY N/A 08/18/2023   Procedure: LAPAROSCOPIC CHOLECYSTECTOMY;  Surgeon: Tanda Locus, MD;  Location: [facility];  Service: General;  Laterality: N/A;   ESOPHAGEAL MANOMETRY N/A 04/17/2016   Procedure: ESOPHAGEAL MANOMETRY (EM);  Surgeon: Elspeth Deward Naval, MD;  Location: [facility];  Service: Gastroenterology;  Laterality: N/A;   ESOPHAGOGASTRODUODENOSCOPY N/A 08/16/2023   Procedure: EGD (ESOPHAGOGASTRODUODENOSCOPY);  Surgeon: Abran Norleen SAILOR, MD;  Location: [facility];  Service: Gastroenterology;  Laterality: N/A;   EXTERNAL FIXATION LEG Left 03/28/2017   [place]   EXTERNAL FIXATION LEG Left  03/28/2017   Procedure: EXTERNAL FIXATION LEG;  Surgeon: Beverley Evalene BIRCH, MD;  Location: [facility];  Service: Orthopedics;  Laterality: Left;   EXTERNAL FIXATION REMOVAL Left 05/08/2017   Procedure: REMOVAL EXTERNAL FIXATION LEFT  LEG WITH HARDWARE REMOVAL;  Surgeon: Beverley Evalene BIRCH, MD;  Location: [facility];  Service: Orthopedics;  Laterality: Left;   fractured femur  05/2012   left - rod -screws placed   HARDWARE REMOVAL Left 04/16/2013   Procedure: REMOVAL OF HARDWARE OF LEFT KNEE (DISTAL INTERLOC SCREW);  Surgeon: Dempsey LULLA Moan, MD;  Location: [facility];  Service: Orthopedics;  Laterality: Left;   INDOCYANINE GREEN  FLUORESCENCE IMAGING (ICG) N/A 08/18/2023   Procedure: INDOCYANINE GREEN  FLUORESCENCE IMAGING (ICG);  Surgeon: Tanda Locus, MD;  Location: [facility];  Service: General;  Laterality: N/A;   KNEE SURGERY     x 3 left / x1 rt knee   LEFT HEART CATH AND CORONARY ANGIOGRAPHY N/A 09/18/2016   Procedure: Left Heart Cath and Coronary Angiography;  Surgeon: Wonda Sharper, MD;  Location: [facility];  Service: Cardiovascular;  Laterality: N/A;   NASAL SINUS SURGERY     2000   OTHER SURGICAL HISTORY  2014   titanium rod in left femur used for fracture repair   SHOULDER SURGERY     bilateral shoulders    WISDOM TOOTH  EXTRACTION     Patient Active Problem List   Diagnosis Date Noted   Abdominal pain 08/16/2023   Calculus of gallbladder without cholecystitis without obstruction 08/16/2023   Recurrent biliary colic 08/16/2023   Hiatal hernia 08/16/2023   Degenerative arthritis of right knee 09/28/2022   Leg pain 08/08/2022   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, nickel  03/31/2017   Closed left tibial fracture 03/28/2017   Closed fracture of left tibial plateau 03/28/2017   Nonintractable headache 01/25/2017   Visual field defect    Chest pain 09/16/2016   Essential tremor 09/16/2016   Right facial numbness 09/16/2016   Bilateral scleritis 08/29/2016   Obesity 06/01/2016    Dyspnea on exertion 05/13/2016   Hoarseness 03/17/2016   Gastroesophageal reflux disease without esophagitis 03/17/2016   Globus sensation 03/17/2016   Gastritis 02/25/2016   Atypical chest pain 02/24/2016   Painful orthopaedic hardware 04/15/2013   Vitamin D deficiency 12/09/2009   HYPERCHOLESTEROLEMIA 12/09/2009   Essential hypertension 12/09/2009   Allergic rhinitis 12/09/2009   NEPHROLITHIASIS 12/09/2009   CERVICAL POLYP 12/09/2009   Osteoarthritis 12/09/2009   Cough variant asthma vs UACS  12/09/2009    PCP: Lamarr Rotunda  REFERRING PROVIDER: Ted Gerard HERO, PA-C  REFERRING DIAG: M54.2 (ICD-10-CM) - Cervicalgia  THERAPY DIAG:  Cervicalgia  Other symptoms and signs involving the musculoskeletal system  Muscle weakness (generalized)  Rationale for Evaluation and Treatment: Rehabilitation  ONSET DATE: chronic  SUBJECTIVE:                                                                                                                                                                                                         SUBJECTIVE STATEMENT: Patient has been performing her exercises and states they are helping.  She states she has better ROM.     PERTINENT HISTORY:  PMHx:  Hx of L femur and tibia fractures with multiple surgeries including hardware removal (last surgery in 2019); R knee arthroscopic surgery in the 90's ; Allergy to nickel and autoimmune disorder, osteoporosis though Pt states it has improved and may be osteopenia ; OA in knees, cervical pain, bilat shoulder surgeries,  R achilles pain due to a reaction from statins, meningioma, and hx trigeminal neuralgia    Gallbladder removal 06/25  PAIN:  Are you having pain? Yes: NPRS scale: 2-3/10 Pain location: L c/sp  Pain description: constant, ache, sore Aggravating factors: constant Relieving factors: none  PRECAUTIONS: Other: Hx of L LE fractures/surgeries, allergy to nickel, osteoporosis  WEIGHT  BEARING RESTRICTIONS: No  FALLS:  Has patient fallen in last  6 months? No   OCCUPATION: Retired  PLOF: Independent  PATIENT GOALS: stop hurting   OBJECTIVE: (objective measures from initial evaluation unless otherwise dated)  Cervical X rays on 03/01/24: Imaging:  severe OA through the vertebrae particularly C5-7 with narrowing, sclerosis, large osteophyte formations. Extensive advanced facet OA also seen. No fractures or dislocations.  (Per PA note)   PATIENT SURVEYS:  NDI:  NECK DISABILITY INDEX  Date: 03/14/24 Score  Pain intensity 2 = The pain is moderate at the moment  2. Personal care (washing, dressing, etc.) 0 = I can look after myself normally without causing extra pain  3. Lifting 0 =  I can lift heavy weights without extra pain  4. Reading 0 = I can read as much as I want to with no pain in my neck  5. Headaches 2 =  I have moderate headaches, which come infrequently  6. Concentration 0 =  I can concentrate fully when I want to with no difficulty  7. Work 1 =  I can only do my usual work, but no more  8. Driving 2 =  I can drive my car as long as I want with moderate pain in my neck  9. Sleeping 2 = My sleep is mildly disturbed (1-2 hrs sleepless)  10. Recreation 1 =  I am able to engage in all my recreation activities, with some pain in my neck  Total 10/50   Minimum Detectable Change (90% confidence): 5 points or 10% points  COGNITION: Overall cognitive status: Within functional limits for tasks assessed  SENSATION: WFL  POSTURE: rounded shoulders and forward head  PALPATION: Grossly tender and hyperactive cervical paraspinals, bilateral UT, suboccipitals   CERVICAL ROM:   Active ROM A/PROM (deg) eval  Flexion 35  Extension 16  Right lateral flexion 15  Left lateral flexion 13  Right rotation 39  Left rotation 34   (Blank rows = not tested) *=pain/symptoms ; extension for increased time causes hands to go to sleep  UPPER EXTREMITY ROM: WFL all  planes for tasks assessed   UPPER EXTREMITY MMT:  MMT Right eval Left eval  Shoulder flexion 4+ 4+  Shoulder extension    Shoulder abduction 5 5  Shoulder adduction    Shoulder extension    Shoulder internal rotation 5 5  Shoulder external rotation 4+ 4+  Middle trapezius    Lower trapezius    Elbow flexion 5 5  Elbow extension 5 5  Wrist flexion    Wrist extension    Wrist ulnar deviation    Wrist radial deviation    Wrist pronation    Wrist supination    Grip strength     (Blank rows = not tested) *=pain/symptoms    TODAY'S TREATMENT:                                                                                                                              DATE:  03/25/24 Seated scapular retractions 2x10 Supine cervical retractions Seated cervical retractions x 10 reps Standing shoulder rolls 2x10 Seated gentle UT stretch 3x10 sec bilat  STM to bilat cervical paraspinals and L UT in supine with bilat LE's elevated   03/14/24  Supine cervical retractions 2 x 10 Supine scapular retractions 2 x 10  PATIENT EDUCATION:  Education details:  exercise form, POC, scope of PT, HEP, posture, self STM. Person educated: Patient Education method: Explanation, Demonstration, and Handouts Education comprehension: verbalized understanding, returned demonstration, verbal cues required, and tactile cues required  HOME EXERCISE PROGRAM: Access Code: [email or web address] URL: [email or web address] Date: 03/14/2024 Prepared by: Prentice Zaunegger  Exercises - Supine Chin Tuck  - 3 x daily - 7 x weekly - 2 sets - 10 reps - Seated Scapular Retraction  - 3 x daily - 7 x weekly - 2 sets - 10 reps  ASSESSMENT:  CLINICAL IMPRESSION: Patient presents to treatment stating the exercises are helping.  PT reviewed and pt performed her HEP.  PT provided instruction and cuing in correct form with exercises and she performed well with instruction.  PT performed STM to cervical  paraspinals and UT to improve pain and soft tissue tightness and mobility.  Pt responded well to treatment stating she felt good after treatment having no c/o's.  She should benefit from cont skilled PT to address impairments and goals and to improve overall function.      OBJECTIVE IMPAIRMENTS:  decreased activity tolerance, decreased endurance, decreased mobility, decreased ROM, decreased strength, hypomobility, increased muscle spasms, impaired flexibility, impaired UE functional use, improper body mechanics, and pain  ACTIVITY LIMITATIONS: carrying, lifting, bending, sleeping, bed mobility, reach over head, hygiene/grooming, and caring for others  PARTICIPATION LIMITATIONS:  meal prep, cleaning, laundry, driving, shopping, community activity, and yard work  PERSONAL FACTORS: 3+ comorbidities: chronic neck pain,  autoimmune disorder, osteoporosis though Pt states it has improved and may be osteopenia ; OA in knees, R achilles pain are also affecting patient's functional outcome.   REHAB POTENTIAL: Good  CLINICAL DECISION MAKING: Evolving/moderate complexity  EVALUATION COMPLEXITY: Moderate   GOALS: Goals reviewed with patient? Yes  SHORT TERM GOALS: Target date: 04/11/2024    Patient will be independent with HEP in order to improve functional outcomes. Baseline: Goal status: INITIAL  2.  Patient will report at least 25% improvement in symptoms for improved quality of life. Baseline:  Goal status: INITIAL    LONG TERM GOALS: Target date: 05/09/2024    Patient will report at least 75% improvement in symptoms for improved quality of life. Baseline:  Goal status: INITIAL  2.  Patient will improve NDI score by at least 5 points in order to indicate improved tolerance to activity. Baseline:  Goal status: INITIAL  3.  Patient will demonstrate at least 25% improvement in cervical ROM in all restricted planes for improved ability to move head with chores. Baseline:  Goal status:  INITIAL  4.  Patient will be able to return to all activities unrestricted for improved ability to perform work functions and participate with family.  Baseline:  Goal status: INITIAL       PLAN:  PT FREQUENCY: 1-2x/week  PT DURATION: 8 weeks  PLANNED INTERVENTIONS: [id number]- PT Re-evaluation, 97110-Therapeutic exercises, 97530- Therapeutic activity, [id number]- Neuromuscular re-education, 97535- Self Care, [id number]- Manual therapy, [id number]- Gait training, [phone]- Orthotic Fit/training, [phone]- Canalith repositioning, [id number]- Aquatic Therapy, 97760- Splinting, [id number]- Wound care (first 20 sq cm), 02401- Wound care (each additional 20 sq cm)Patient/Family education, Balance training, Stair  training, Taping, Dry Needling, Joint mobilization, Joint manipulation, Spinal manipulation, Spinal mobilization, Scar mobilization, and DME instructions.  PLAN FOR NEXT SESSION: cervical mobility and postural strength, manual if needed for pain/mobility   Leigh Minerva III PT, DPT 03/27/24 8:23 AM  "

## 2024-03-26 ENCOUNTER — Encounter (HOSPITAL_BASED_OUTPATIENT_CLINIC_OR_DEPARTMENT_OTHER): Payer: Self-pay | Admitting: Physical Therapy

## 2024-03-28 ENCOUNTER — Encounter: Payer: Self-pay | Admitting: Dietician

## 2024-03-28 ENCOUNTER — Encounter: Attending: Family Medicine | Admitting: Dietician

## 2024-03-28 DIAGNOSIS — R7303 Prediabetes: Secondary | ICD-10-CM | POA: Insufficient documentation

## 2024-03-28 NOTE — Progress Notes (Signed)
 Class start Time: 1400   Class End Time: 1500  This was a class of 3 patients.  Patient was seen on 03/28/24 for the Core Session 15 of Diabetes Prevention Program course at [facility]. By the end of this session patients are able to complete the following objectives:   Learning Objectives: Explain how to prevent stress or cope with unavoidable stress.  Describe how this program can be a source of stress.  Explain how to manage stressful situations.  Create and follow an action plan for either preventing or coping with a stressful situation.   Goals:  Record weight taken outside of class.  Track foods and beverages eaten each day in the Food and Activity Tracker, including calories and fat grams for each item.   Track activity type, minutes you were active, and distance you reached each day in the Food and Activity Tracker.  Do your best to reach activity goal for the week. Follow your action plan to reduce stress.  Answer questions on handout regarding success of action plan.   Follow-Up Plan: Attend Core Session 16 next week.  Bring completed Food and Activity Tracker next week to be reviewed by Lifestyle Coach.

## 2024-04-11 ENCOUNTER — Encounter: Admitting: Dietician

## 2024-04-14 ENCOUNTER — Ambulatory Visit (HOSPITAL_BASED_OUTPATIENT_CLINIC_OR_DEPARTMENT_OTHER): Admitting: Physical Therapy

## 2024-04-21 ENCOUNTER — Encounter (HOSPITAL_BASED_OUTPATIENT_CLINIC_OR_DEPARTMENT_OTHER): Admitting: Physical Therapy

## 2024-04-22 ENCOUNTER — Encounter: Admitting: Dietician

## 2024-04-30 ENCOUNTER — Encounter (HOSPITAL_BASED_OUTPATIENT_CLINIC_OR_DEPARTMENT_OTHER): Admitting: Physical Therapy

## 2024-05-07 ENCOUNTER — Encounter (HOSPITAL_BASED_OUTPATIENT_CLINIC_OR_DEPARTMENT_OTHER): Admitting: Physical Therapy

## 2024-05-09 ENCOUNTER — Encounter: Admitting: Dietician

## 2024-06-06 ENCOUNTER — Encounter: Admitting: Dietician

## 2024-07-04 ENCOUNTER — Encounter: Admitting: Dietician

## 2024-08-01 ENCOUNTER — Encounter: Admitting: Dietician

## 2024-09-05 ENCOUNTER — Encounter: Admitting: Dietician

## 2024-10-10 ENCOUNTER — Encounter: Admitting: Dietician

## 2024-11-07 ENCOUNTER — Encounter: Admitting: Dietician
